# Patient Record
Sex: Female | Born: 1950
Health system: Southern US, Community
[De-identification: ages and names within clinical notes are randomized; demographics above are authoritative.]

## PROBLEM LIST (undated history)

## (undated) DIAGNOSIS — L309 Dermatitis, unspecified: Secondary | ICD-10-CM

## (undated) DIAGNOSIS — E785 Hyperlipidemia, unspecified: Secondary | ICD-10-CM

## (undated) DIAGNOSIS — Z8041 Family history of malignant neoplasm of ovary: Secondary | ICD-10-CM

## (undated) DIAGNOSIS — Z803 Family history of malignant neoplasm of breast: Secondary | ICD-10-CM

## (undated) DIAGNOSIS — Z923 Personal history of irradiation: Secondary | ICD-10-CM

## (undated) DIAGNOSIS — C50919 Malignant neoplasm of unspecified site of unspecified female breast: Secondary | ICD-10-CM

## (undated) DIAGNOSIS — I10 Essential (primary) hypertension: Secondary | ICD-10-CM

## (undated) HISTORY — PX: KNEE SURGERY: SHX244

## (undated) HISTORY — DX: Family history of malignant neoplasm of ovary: Z80.41

## (undated) HISTORY — DX: Family history of malignant neoplasm of breast: Z80.3

## (undated) HISTORY — PX: ABDOMINAL HYSTERECTOMY: SHX81

## (undated) HISTORY — DX: Dermatitis, unspecified: L30.9

## (undated) HISTORY — PX: DILATION AND CURETTAGE OF UTERUS: SHX78

## (undated) HISTORY — DX: Malignant neoplasm of unspecified site of unspecified female breast: C50.919

---

## 1999-02-25 ENCOUNTER — Other Ambulatory Visit: Admission: RE | Admit: 1999-02-25 | Discharge: 1999-02-25 | Payer: Self-pay | Admitting: *Deleted

## 2003-10-09 ENCOUNTER — Other Ambulatory Visit: Admission: RE | Admit: 2003-10-09 | Discharge: 2003-10-09 | Payer: Self-pay | Admitting: Family Medicine

## 2005-02-06 ENCOUNTER — Other Ambulatory Visit: Admission: RE | Admit: 2005-02-06 | Discharge: 2005-02-06 | Payer: Self-pay | Admitting: Family Medicine

## 2006-02-10 ENCOUNTER — Encounter: Admission: RE | Admit: 2006-02-10 | Discharge: 2006-02-10 | Payer: Self-pay | Admitting: Family Medicine

## 2006-06-27 ENCOUNTER — Other Ambulatory Visit: Admission: RE | Admit: 2006-06-27 | Discharge: 2006-06-27 | Payer: Self-pay | Admitting: Family Medicine

## 2006-06-28 ENCOUNTER — Encounter: Admission: RE | Admit: 2006-06-28 | Discharge: 2006-06-28 | Payer: Self-pay | Admitting: Radiology

## 2006-09-12 ENCOUNTER — Encounter: Admission: RE | Admit: 2006-09-12 | Discharge: 2006-09-12 | Payer: Self-pay | Admitting: Family Medicine

## 2008-07-20 ENCOUNTER — Other Ambulatory Visit: Admission: RE | Admit: 2008-07-20 | Discharge: 2008-07-20 | Payer: Self-pay | Admitting: Family Medicine

## 2009-07-23 ENCOUNTER — Other Ambulatory Visit: Admission: RE | Admit: 2009-07-23 | Discharge: 2009-07-23 | Payer: Self-pay | Admitting: Family Medicine

## 2012-10-07 ENCOUNTER — Telehealth (HOSPITAL_COMMUNITY): Payer: Self-pay | Admitting: Cardiac Rehabilitation

## 2013-10-21 ENCOUNTER — Ambulatory Visit (HOSPITAL_COMMUNITY): Payer: BC Managed Care – PPO | Attending: Cardiology | Admitting: Radiology

## 2013-10-21 ENCOUNTER — Other Ambulatory Visit (HOSPITAL_COMMUNITY): Payer: Self-pay | Admitting: Radiology

## 2013-10-21 VITALS — BP 106/72 | Ht 63.0 in | Wt 149.0 lb

## 2013-10-21 DIAGNOSIS — R002 Palpitations: Secondary | ICD-10-CM

## 2013-10-21 DIAGNOSIS — I059 Rheumatic mitral valve disease, unspecified: Secondary | ICD-10-CM | POA: Insufficient documentation

## 2013-10-21 DIAGNOSIS — E785 Hyperlipidemia, unspecified: Secondary | ICD-10-CM | POA: Insufficient documentation

## 2013-10-21 NOTE — Progress Notes (Signed)
Echocardiogram performed.  

## 2014-07-06 ENCOUNTER — Other Ambulatory Visit: Payer: Self-pay | Admitting: *Deleted

## 2014-07-06 ENCOUNTER — Encounter (INDEPENDENT_AMBULATORY_CARE_PROVIDER_SITE_OTHER): Payer: BLUE CROSS/BLUE SHIELD

## 2014-07-06 ENCOUNTER — Encounter: Payer: Self-pay | Admitting: *Deleted

## 2014-07-06 DIAGNOSIS — R55 Syncope and collapse: Secondary | ICD-10-CM

## 2014-07-06 NOTE — Progress Notes (Signed)
Per [place] at [facility] the pt needs to have a ecardio event monitor placed on by our office on 07/06/14 for pre-syncopal episodes.  Per scheduling Mack Guise and monitor tech [place], the monitor is to be placed under the DOD Dr Lovena Le.

## 2014-07-06 NOTE — Progress Notes (Signed)
Patient ID: April [name], female   DOB: 12-10-1950, 64 y.o.   MRN: [id number] [employer] 30 day cardiac event monitor applied to patient.

## 2016-01-31 ENCOUNTER — Encounter: Payer: Self-pay | Admitting: Podiatry

## 2016-01-31 ENCOUNTER — Ambulatory Visit (INDEPENDENT_AMBULATORY_CARE_PROVIDER_SITE_OTHER): Payer: BC Managed Care – PPO

## 2016-01-31 ENCOUNTER — Ambulatory Visit (INDEPENDENT_AMBULATORY_CARE_PROVIDER_SITE_OTHER): Payer: BC Managed Care – PPO | Admitting: Podiatry

## 2016-01-31 VITALS — BP 172/90 | HR 65 | Resp 14 | Ht 63.0 in | Wt 148.0 lb

## 2016-01-31 DIAGNOSIS — M722 Plantar fascial fibromatosis: Secondary | ICD-10-CM | POA: Diagnosis not present

## 2016-01-31 DIAGNOSIS — M79672 Pain in left foot: Secondary | ICD-10-CM

## 2016-01-31 MED ORDER — TRIAMCINOLONE ACETONIDE 10 MG/ML IJ SUSP
10.0000 mg | Freq: Once | INTRAMUSCULAR | Status: AC
  Administered 2016-01-31: 10 mg

## 2016-01-31 NOTE — Progress Notes (Signed)
   Subjective:    Patient ID: April [name], female    DOB: 11-Oct-1950, 65 y.o.   MRN: [id number]  HPI  Started having pain and pins in the heel arch area in July.      Review of Systems  HENT: Positive for sneezing.   Cardiovascular: Positive for palpitations.       Objective:   Physical Exam        Assessment & Plan:

## 2016-02-03 NOTE — Progress Notes (Signed)
Subjective:     Patient ID: April [name], female   DOB: [birth year], 65 y.o.   MRN: [id number]  HPI patient presents stating that the left foot is very sore in the heel and making it hard for her to walk is been present for several months   Review of Systems  All other systems reviewed and are negative.      Objective:   Physical Exam  Constitutional: She is oriented to person, place, and time.  Cardiovascular: Intact distal pulses.   Musculoskeletal: Normal range of motion.  Neurological: She is oriented to person, place, and time.  Skin: Skin is warm.  Nursing note and vitals reviewed.  neurovascular status found to be intact with muscle strength adequate range of motion within normal limits. Patient's found to have inflammation and pain aspect of the left heel at the insertional point tendon into the calcaneus and is found to have good digital perfusion and is well oriented 3     Assessment:     Inflammatory fasciitis left heel at the insertional point of the tendon into the calcaneus    Plan:     H&P x-rays reviewed and carefully injected the plantar fascia 3 mg Kenalog 5 mg Xylocaine and applied fascial brace. Gave instructions on physical therapy and stretching and reappoint to recheck

## 2016-02-14 ENCOUNTER — Ambulatory Visit (INDEPENDENT_AMBULATORY_CARE_PROVIDER_SITE_OTHER): Payer: BC Managed Care – PPO | Admitting: Podiatry

## 2016-02-14 ENCOUNTER — Encounter: Payer: Self-pay | Admitting: Podiatry

## 2016-02-14 DIAGNOSIS — M722 Plantar fascial fibromatosis: Secondary | ICD-10-CM

## 2016-02-14 MED ORDER — TRIAMCINOLONE ACETONIDE 10 MG/ML IJ SUSP
10.0000 mg | Freq: Once | INTRAMUSCULAR | Status: AC
  Administered 2016-02-14: 10 mg

## 2016-02-16 NOTE — Progress Notes (Signed)
Subjective:     Patient ID: April [name], female   DOB: Jul 29, 1950, 65 y.o.   MRN: [id number]  HPI patient presents stating that she is improving but still having pain   Review of Systems     Objective:   Physical Exam Neurovascular status intact with patient having discomfort plantar aspect left heel of a moderate nature    Assessment:     Plantar fasciitis left still present but improved    Plan:     Reinjected the plantar fascial left 3 mg Kenalog 5 mg Xylocaine and instructed on physical therapy supportive shoes and anti-inflammatories and will be seen back as needed

## 2016-07-19 ENCOUNTER — Ambulatory Visit: Payer: BC Managed Care – PPO

## 2016-07-19 ENCOUNTER — Ambulatory Visit (HOSPITAL_COMMUNITY)
Admission: EM | Admit: 2016-07-19 | Discharge: 2016-07-19 | Disposition: A | Discharge status: Home / Self Care | Payer: BLUE CROSS/BLUE SHIELD

## 2017-05-19 DIAGNOSIS — C50919 Malignant neoplasm of unspecified site of unspecified female breast: Secondary | ICD-10-CM | POA: Diagnosis present

## 2017-05-19 DIAGNOSIS — Z170 Estrogen receptor positive status [ER+]: Secondary | ICD-10-CM | POA: Diagnosis present

## 2017-05-19 DIAGNOSIS — C50912 Malignant neoplasm of unspecified site of left female breast: Secondary | ICD-10-CM | POA: Diagnosis present

## 2017-05-19 HISTORY — DX: Malignant neoplasm of unspecified site of unspecified female breast: C50.919

## 2017-06-20 ENCOUNTER — Other Ambulatory Visit: Payer: Self-pay | Admitting: Radiology

## 2017-06-22 ENCOUNTER — Telehealth: Payer: Self-pay | Admitting: Oncology

## 2017-06-22 ENCOUNTER — Encounter: Payer: Self-pay | Admitting: *Deleted

## 2017-06-22 NOTE — Telephone Encounter (Signed)
Confirmed upcoming [facility] appointment for 06/27/17, patient is a [facility] patient no paperwork sent

## 2017-06-26 ENCOUNTER — Other Ambulatory Visit: Payer: Self-pay | Admitting: *Deleted

## 2017-06-26 DIAGNOSIS — Z171 Estrogen receptor negative status [ER-]: Principal | ICD-10-CM

## 2017-06-26 DIAGNOSIS — C50411 Malignant neoplasm of upper-outer quadrant of right female breast: Secondary | ICD-10-CM | POA: Insufficient documentation

## 2017-06-27 ENCOUNTER — Encounter: Payer: Self-pay | Admitting: Oncology

## 2017-06-27 ENCOUNTER — Encounter: Payer: Self-pay | Admitting: *Deleted

## 2017-06-27 ENCOUNTER — Ambulatory Visit: Payer: Medicare Other | Attending: General Surgery | Admitting: Physical Therapy

## 2017-06-27 ENCOUNTER — Encounter: Payer: Self-pay | Admitting: Radiation Oncology

## 2017-06-27 ENCOUNTER — Encounter: Payer: Self-pay | Admitting: Physical Therapy

## 2017-06-27 ENCOUNTER — Inpatient Hospital Stay: Payer: Medicare Other

## 2017-06-27 ENCOUNTER — Inpatient Hospital Stay: Payer: Medicare Other | Attending: Oncology | Admitting: Oncology

## 2017-06-27 ENCOUNTER — Ambulatory Visit
Admission: RE | Admit: 2017-06-27 | Discharge: 2017-06-27 | Disposition: A | Discharge status: Home / Self Care | Payer: BLUE CROSS/BLUE SHIELD | Source: Ambulatory Visit | Attending: Radiation Oncology | Admitting: Radiation Oncology

## 2017-06-27 VITALS — BP 198/86 | HR 70 | Temp 98.3°F | Resp 18 | Ht 63.0 in | Wt 151.0 lb

## 2017-06-27 DIAGNOSIS — R293 Abnormal posture: Secondary | ICD-10-CM | POA: Diagnosis present

## 2017-06-27 DIAGNOSIS — Z803 Family history of malignant neoplasm of breast: Secondary | ICD-10-CM | POA: Diagnosis not present

## 2017-06-27 DIAGNOSIS — Z90710 Acquired absence of both cervix and uterus: Secondary | ICD-10-CM | POA: Diagnosis not present

## 2017-06-27 DIAGNOSIS — Z171 Estrogen receptor negative status [ER-]: Secondary | ICD-10-CM | POA: Insufficient documentation

## 2017-06-27 DIAGNOSIS — C50411 Malignant neoplasm of upper-outer quadrant of right female breast: Secondary | ICD-10-CM | POA: Insufficient documentation

## 2017-06-27 DIAGNOSIS — Z8041 Family history of malignant neoplasm of ovary: Secondary | ICD-10-CM

## 2017-06-27 LAB — CBC WITH DIFFERENTIAL (CANCER CENTER ONLY)
Abs Granulocyte: 1.8 10*3/uL (ref 1.5–6.5)
Basophils Absolute: 0 10*3/uL (ref 0.0–0.1)
Basophils Relative: 1 %
Eosinophils Absolute: 0.2 10*3/uL (ref 0.0–0.5)
Eosinophils Relative: 4 %
HCT: 37.5 % (ref 34.8–46.6)
Hemoglobin: 12.2 g/dL (ref 11.6–15.9)
Lymphocytes Relative: 43 %
Lymphs Abs: 1.8 10*3/uL (ref 0.9–3.3)
MCH: 28.9 pg (ref 25.1–34.0)
MCHC: 32.6 g/dL (ref 31.5–36.0)
MCV: 88.8 fL (ref 79.5–101.0)
Monocytes Absolute: 0.4 10*3/uL (ref 0.1–0.9)
Monocytes Relative: 9 %
Neutro Abs: 1.8 10*3/uL (ref 1.5–6.5)
Neutrophils Relative %: 43 %
Platelet Count: 274 10*3/uL (ref 145–400)
RBC: 4.22 MIL/uL (ref 3.70–5.45)
RDW: 13.3 % (ref 11.2–16.1)
WBC Count: 4.2 10*3/uL (ref 4.0–10.3)

## 2017-06-27 LAB — CMP (CANCER CENTER ONLY)
ALT: 14 U/L (ref 0–55)
AST: 16 U/L (ref 5–34)
Albumin: 4 g/dL (ref 3.5–5.0)
Alkaline Phosphatase: 77 U/L (ref 40–150)
Anion gap: 10 (ref 3–11)
BUN: 14 mg/dL (ref 7–26)
CO2: 26 mmol/L (ref 22–29)
Calcium: 9.6 mg/dL (ref 8.4–10.4)
Chloride: 106 mmol/L (ref 98–109)
Creatinine: 1.01 mg/dL (ref 0.70–1.30)
GFR, Est AFR Am: >60 mL/min (ref >60)
GFR, Estimated: 56 mL/min — ABNORMAL LOW (ref >60)
Glucose, Bld: 96 mg/dL (ref 70–140)
Potassium: 4.1 mmol/L (ref 3.3–4.7)
Sodium: 142 mmol/L (ref 136–145)
Total Bilirubin: 0.2 mg/dL (ref 0.2–1.2)
Total Protein: 7.7 g/dL (ref 6.4–8.3)

## 2017-06-27 NOTE — Progress Notes (Signed)
[place]  [phone] [phone]) [phone]     ID: April [name] DOB: 1950/11/25  MR#: [id number]  FBP#:[id number]  Patient Care Team: Lavone Orn, MD as PCP - General (Internal Medicine) Malka Bocek, Virgie Dad, MD as Consulting Physician (Oncology) Rolm Bookbinder, MD as Consulting Physician (General Surgery) Eppie Gibson, MD as Attending Physician (Radiation Oncology) Newt Minion, MD as Consulting Physician (Orthopedic Surgery) Bobetta Lime, MD OTHER MD:  CHIEF COMPLAINT: Estrogen receptor negative breast cancer  CURRENT TREATMENT: Awaiting definitive surgery   HISTORY OF CURRENT ILLNESS: "April [name]" had bilateral screening mammography at [facility] 06/06/2017.  This showed a possible mass in the right breast at the 11 o'clock position.  On 06/13/2017 she underwent right diagnostic mammography and ultrasonography.  Breast density was category C.  In the right breast at the 11 o'clock position there was a 1 cm area by mammography.  By ultrasound this confirmed a 1.0 cm irregular mass with lobulated margins in the upper outer quadrant of the right breast.  There was a second, 0.4 cm lobulated mass in the same quadrant.  The right axilla was sonographically benign.  On 06/20/2017 biopsy of the 2 right breast masses in question was performed.  The final pathology (SAA [id number]) found the smaller mass to be only fibrocystic change.  This is felt to be concordant.  The larger mass however was an invasive ductal carcinoma, grade 3, estrogen and progesterone receptor negative, with an MIB-1 of 30%, and HER-2 amplified, with a signals ratio of 2.24.  The number per cell was 4.60.  The patient's subsequent history is as detailed below.  INTERVAL HISTORY: April [name] was evaluated in the multidisciplinary breast cancer clinic 06/27/2017, accompanied by her husband April [name].  Her case was also presented at the multidisciplinary breast cancer conference on the same day.  At that time a preliminary plan was proposed: Psychologist, sport and exercise, breast conserving surgery with sentinel lymph node sampling, adjuvant chemotherapy, adjuvant radiation   REVIEW OF SYSTEMS: There were no specific symptoms leading to the original mammogram, which was routinely scheduled. The patient denies unusual headaches, visual changes, nausea, vomiting, stiff neck, dizziness, or gait imbalance. There has been no cough, phlegm production, or pleurisy, no chest pain or pressure, and no change in bowel or bladder habits. The patient denies fever, rash, bleeding, unexplained fatigue or unexplained weight loss.  She does not exercise regularly.  A detailed review of systems was otherwise entirely negative.   PAST MEDICAL HISTORY: Past Medical History:  Diagnosis Date  . Eczema     PAST SURGICAL HISTORY: Past Surgical History:  Procedure Laterality Date  . ABDOMINAL HYSTERECTOMY    . CESAREAN SECTION    . KNEE SURGERY      FAMILY HISTORY Family History  Problem Relation Age of Onset  . Breast cancer Mother   . Breast cancer Sister   . Breast cancer Maternal Grandmother   The patient's father died at age 61 from a heart attack.  The patient's mother is currently living at age 90 (as of January 2019).  The patient had 1 sister who was diagnosed with breast cancer at age 57 and died from metastatic disease at age 27.  The patient has 1 brother.  In addition the patient's mother was diagnosed with breast cancer at age 6, on the left side, and now has a right-sided breast cancer diagnosed in January 2019.  There is in addition a cousin with ovarian cancer diagnosed when she was 68 years old  GYNECOLOGIC  HISTORY:  No LMP recorded. Patient is not currently having periods (Reason: Perimenopausal). Menarche age 20, first live birth age 29, the patient had 3 live births, 1 of whom survived only 2 days.  She underwent hysterectomy without salpingo-oophorectomy September 05, 1978.  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  She is now retired.  Her husband April [name] his Theme park manager at [employer].  The patient's daughter April [name] lives in [place] where she is an Tourist information centre manager.  The patient's daughter April [name] lives in [place] working for the department of defense.  The patient has 3 grandchildren.     ADVANCED DIRECTIVES: Not in place   HEALTH MAINTENANCE: Social History   Tobacco Use  . Smoking status: Never Smoker  . Smokeless tobacco: Never Used  Substance Use Topics  . Alcohol use: Yes  . Drug use: No     Colonoscopy: April 2018/[facility]  PAP: Status post hysterectomy  Bone density:   Allergies  Allergen Reactions  . Tape Itching and Rash  . Nsaids   . Sulfa Antibiotics Rash    Current Outpatient Medications  Medication Sig Dispense Refill  . CALCIUM-VITAMIN D PO Take 1 tablet by mouth daily.    Marland Kitchen GENERIC EXTERNAL MEDICATION Take 1 tablet by mouth daily.    Marland Kitchen GENERIC EXTERNAL MEDICATION Take 16 oz by mouth daily.    Marland Kitchen GENERIC EXTERNAL MEDICATION Take 8 oz by mouth daily.    . Multiple Vitamin (MULTIVITAMIN WITH MINERALS) TABS tablet Take 1 tablet by mouth daily.    . vitamin B-12 (CYANOCOBALAMIN) 1000 MCG tablet Take 1,000 mcg by mouth daily.     No current facility-administered medications for this visit.     OBJECTIVE: Middle-aged African-American [name] who appears well  Vitals:   06/27/17 0906  BP: (!) 198/86  Pulse: 70  Resp: 18  Temp: 98.3 F (36.8 C)  SpO2: 100%     Body mass index is 26.75 kg/m.   Wt Readings from Last 3 Encounters:  06/27/17 151 lb (68.5 kg)  01/31/16 148 lb (67.1 kg)  10/21/13 149 lb (67.6 kg)      ECOG FS:0 - Asymptomatic  Ocular: Sclerae unicteric, pupils round and equal Ear-nose-throat: Oropharynx clear and moist Lymphatic: No cervical or supraclavicular  adenopathy Lungs no rales or rhonchi Heart regular rate and rhythm Abd soft, nontender, positive bowel sounds MSK no focal spinal tenderness, no joint edema Neuro: non-focal, well-oriented, appropriate affect Breasts: The right breast is status post recent biopsy.  There is a mild ecchymosis.  I do not palpate a mass.  There are no skin or nipple changes of concern.  Left breast is unremarkable.  Both axillae are benign   LAB RESULTS:  CMP     Component Value Date/Time   NA 142 06/27/2017 0843   K 4.1 06/27/2017 0843   CL 106 06/27/2017 0843   CO2 26 06/27/2017 0843   GLUCOSE 96 06/27/2017 0843   BUN 14 06/27/2017 0843   CALCIUM 9.6 06/27/2017 0843   PROT 7.7 06/27/2017 0843   ALBUMIN 4.0 06/27/2017 0843   AST 16 06/27/2017 0843   ALT 14 06/27/2017 0843   ALKPHOS 77 06/27/2017 0843   BILITOT 0.2 06/27/2017 0843    No results found for: TOTALPROTELP, ALBUMINELP, A1GS, A2GS, BETS, BETA2SER, GAMS, MSPIKE, SPEI  No results found for: KPAFRELGTCHN, LAMBDASER, KAPLAMBRATIO  Lab Results  Component Value Date   NEUTROABS 1.8 06/27/2017   HCT 37.5 06/27/2017   MCV 88.8 06/27/2017    '@LASTCHEMISTRY' @  No results found for: LABCA2  No components found for: [id number]  No results for input(s): INR in the last 168 hours.  No results found for: LABCA2  No results found for: RFX588  No results found for: TGP498  No results found for: YME158  No results found for: CA2729  No components found for: HGQUANT  No results found for: CEA1 / No results found for: CEA1   No results found for: AFPTUMOR  No results found for: CHROMOGRNA  No results found for: PSA1  Appointment on 06/27/2017  Component Date Value Ref Range Status  . Sodium 06/27/2017 142  136 - 145 mmol/L Final  . Potassium 06/27/2017 4.1  3.3 - 4.7 mmol/L Final  . Chloride 06/27/2017 106  98 - 109 mmol/L Final  . CO2 06/27/2017 26  22 - 29 mmol/L Final  . Glucose, Bld 06/27/2017 96  70 - 140 mg/dL Final   . BUN 06/27/2017 14  7 - 26 mg/dL Final  . Creatinine 06/27/2017 1.01  0.70 - 1.30 mg/dL Final  . Calcium 06/27/2017 9.6  8.4 - 10.4 mg/dL Final  . Total Protein 06/27/2017 7.7  6.4 - 8.3 g/dL Final  . Albumin 06/27/2017 4.0  3.5 - 5.0 g/dL Final  . AST 06/27/2017 16  5 - 34 U/L Final  . ALT 06/27/2017 14  0 - 55 U/L Final  . Alkaline Phosphatase 06/27/2017 77  40 - 150 U/L Final  . Total Bilirubin 06/27/2017 0.2  0.2 - 1.2 mg/dL Final  . GFR, Est Non Af Am 06/27/2017 56* >60 mL/min Final  . GFR, Est AFR Am 06/27/2017 >60  >60 mL/min Final   Comment: (NOTE) The eGFR has been calculated using the CKD EPI equation. This calculation has not been validated in all clinical situations. eGFR's persistently <60 mL/min signify possible Chronic Kidney Disease.   Georgiann Hahn gap 06/27/2017 10  3 - 11 Final   Performed at [facility], [place] [address] [place], [place] [address]  . WBC Count 06/27/2017 4.2  4.0 - 10.3 K/uL Final  . RBC 06/27/2017 4.22  3.70 - 5.45 MIL/uL Final  . Hemoglobin 06/27/2017 12.2  11.6 - 15.9 g/dL Final  . HCT 06/27/2017 37.5  34.8 - 46.6 % Final  . MCV 06/27/2017 88.8  79.5 - 101.0 fL Final  . MCH 06/27/2017 28.9  25.1 - 34.0 pg Final  . MCHC 06/27/2017 32.6  31.5 - 36.0 g/dL Final  . RDW 06/27/2017 13.3  11.2 - 16.1 % Final  . Platelet Count 06/27/2017 274  145 - 400 K/uL Final  . Neutrophils Relative % 06/27/2017 43  % Final  . Neutro Abs 06/27/2017 1.8  1.5 - 6.5 K/uL Final  . Abs Granulocyte 06/27/2017 1.8  1.5 - 6.5 K/uL Final  . Lymphocytes Relative 06/27/2017 43  % Final  . Lymphs Abs 06/27/2017 1.8  0.9 - 3.3 K/uL Final  . Monocytes Relative 06/27/2017 9  % Final  . Monocytes Absolute 06/27/2017 0.4  0.1 - 0.9 K/uL Final  . Eosinophils Relative 06/27/2017 4  % Final  . Eosinophils Absolute 06/27/2017 0.2  0.0 - 0.5 K/uL Final  . Basophils Relative 06/27/2017 1  % Final  . Basophils Absolute 06/27/2017 0.0  0.0 - 0.1 K/uL Final    Performed at [facility], [address] [place], [place] [address]    (this displays the last labs from the last 3 days)  No results found for: TOTALPROTELP, ALBUMINELP, A1GS, A2GS, BETS, BETA2SER, GAMS, MSPIKE, SPEI (this displays SPEP labs)  No results found for: KPAFRELGTCHN, LAMBDASER, KAPLAMBRATIO (kappa/lambda light chains)  No results found for: HGBA, HGBA2QUANT, HGBFQUANT, HGBSQUAN (Hemoglobinopathy evaluation)   No results found for: LDH  No results found for: IRON, TIBC, IRONPCTSAT (Iron and TIBC)  No results found for: FERRITIN  Urinalysis No results found for: COLORURINE, APPEARANCEUR, LABSPEC, PHURINE, GLUCOSEU, HGBUR, BILIRUBINUR, KETONESUR, PROTEINUR, UROBILINOGEN, NITRITE, LEUKOCYTESUR   STUDIES: Outside studies reviewed  ELIGIBLE FOR AVAILABLE RESEARCH PROTOCOL: no  ASSESSMENT: 67 y.o. April [name] status post right breast upper outer quadrant biopsy 06/20/2017 for a clinical T1b N0, stage IA invasive ductal carcinoma, grade 3, estrogen and progesterone receptor negative, but HER-2 amplified, with an MIB-1 of 30%  (1) genetics testing scheduled for 06/28/2017  (2) definitive surgery to follow  (3) adjuvant chemotherapy will consist of paclitaxel weekly x12 together with trastuzumab every 21 days  (4) trastuzumab will be continued to total 1 year  (a) echo pending  5) adjuvant radiation to follow  PLAN: We spent the better part of today's hour-long appointment discussing the biology of her diagnosis and the specifics of her situation. We first reviewed the fact that cancer is not one disease but more than 100 different diseases and that it is important to keep them separate-- otherwise when friends and relatives discuss their own cancer experiences with Maesyn confusion can result. Similarly we explained that if breast cancer spreads to the bone or liver, the patient would not have bone cancer or liver cancer,  but breast cancer in the bone and breast cancer in the liver: one cancer in three places-- not 3 different cancers which otherwise would have to be treated in 3 different ways.  We discussed the difference between local and systemic therapy. In terms of loco-regional treatment, lumpectomy plus radiation is equivalent to mastectomy as far as survival is concerned. For this reason, and because the cosmetic results are generally superior, we generally recommend breast conserving surgery.  In her case however there is a strong family history suggestive of a deleterious mutation so we discussed that in detail as well.  In patients who carry a deleterious mutation [for example in a  BRCA gene], the risk of a new breast cancer developing in the future may be sufficiently great that the patient may choose bilateral mastectomies. However if she wishes to keep her breasts in that situation it is safe to do so. That would require intensified screening, which generally means not only yearly mammography but a yearly breast MRI as well. Of course, if there is a deleterious mutation bilateral oophorectomy would be necessary as there is no standard screening protocol for ovarian cancer.  We then discussed the rationale for systemic therapy. There is some risk that this aggressive, fast growing, HER-2 positive breast cancer may have already spread to other parts of her body. Patients frequently ask at this point about bone scans, CAT scans and PET scans to find out if they have occult breast cancer somewhere else. The problem is that in early stage disease we are much more likely to find false positives then true cancers and this would expose the patient to unnecessary procedures as well as unnecessary radiation. Scans cannot answer the question the patient really would like to know, which is whether she has microscopic disease  elsewhere in her body. For those reasons we do not recommend them.  Of course we would proceed to  aggressive evaluation of any symptoms that might suggest metastatic disease, but that is not the case here.  Next we went over the options for systemic therapy which are anti-estrogens, anti-HER-2 immunotherapy, and chemotherapy. Lucie does not meet criteria for anti-estrogens.  She is a good candidate for anti-HER-2 immunotherapy and chemotherapy.  We specifically discussed paclitaxel weekly x12, as well as trastuzumab every 3 weeks for a 1 year.  We discussed the possible toxicity side effects and complications of these agents.  Audrielle understands she will need a port and this can be placed at the time of her definitive surgery.  The overall plan then is for genetics testing, definitive surgery, chemotherapy with Taxol and immunotherapy with Herceptin for 1 year, with adjuvant radiation to follow after chemotherapy.  Natelie understands the goal of treatment is cure.  Her chance of cure will be excellent with this treatment, which is entirely standard.  Before she returns to see me after her surgery she will also have had a repeat echocardiogram (her most recent one, in 2015, was normal), and she will also go to chemotherapy school to learn more about the agents she will be receiving  Kaiyah has a good understanding of the overall plan. She agrees with it. She knows the goal of treatment in her case is cure. She will call with any problems that may develop before her next visit here.   Marney Treloar, Virgie Dad, MD  06/27/17 9:48 AM Medical Oncology and Hematology [facility] [address] [place], [place] [address] Tel. [phone]    Fax. [phone]  This document serves as a record of services personally performed by Lurline Del, MD. It was created on his behalf by Sheron Nightingale, a trained medical scribe. The creation of this record is based on the scribe's personal observations and the provider's statements to them.   I have reviewed the above documentation for  accuracy and completeness, and I agree with the above.

## 2017-06-27 NOTE — Progress Notes (Signed)
Nutrition Assessment  Reason for Assessment:  Pt seen in Breast Clinic  ASSESSMENT:   67 year old female with new diagnosis of breast cancer.  Past medical history reviewed.  Patient reports good appetite.  Medications:  reviewed  Labs: reviewed  Anthropometrics:   Height: 63 inches Weight: 151 lb BMI: 26   NUTRITION DIAGNOSIS: Food and nutrition related knowledge deficit related to new diagnosis of breast cancer as evidenced by no prior need for nutrition related information.  INTERVENTION:   Discussed and provided packet of information regarding nutritional tips for breast cancer patients.  Questions answered.  Teachback method used.  Contact information provided and patient knows to contact me with questions/concerns.    MONITORING, EVALUATION, and GOAL: Pt will consume a healthy plant based diet to maintain lean body mass throughout treatment.   Deanglo Hissong B. Zenia Resides, [place], [place] Registered Dietitian [phone] (pager)

## 2017-06-27 NOTE — Progress Notes (Signed)
Radiation Oncology         [phone] ________________________________  Initial Outpatient Consultation  Name: April [name] MRN: [id number]  Date: 06/27/2017  DOB: Aug 06, 1950  PZ:WCHENID, Jenny Reichmann, MD  Rolm Bookbinder, MD   REFERRING PHYSICIAN: Rolm Bookbinder, MD  DIAGNOSIS:    ICD-10-CM   1. Malignant neoplasm of upper-outer quadrant of right breast in female, estrogen receptor negative ([place]) C50.411    Z17.1   Cancer Staging Malignant neoplasm of upper-outer quadrant of right breast in female, estrogen receptor negative ([place]) Staging form: Breast, AJCC 8th Edition - Clinical stage from 06/27/2017: Stage IA (cT1b, cN0, cM0, G3, ER: Negative, PR: Negative, HER2: Positive) - Signed by Eppie Gibson, MD on 06/27/2017 Staging comments: Staged at breast conference on 1.9.19   Stage IA Right Breast UOQ Invasive Ductal Carcinoma with DCIS, ER(-) / PR(-) / Her2(+), Grade 3  CHIEF COMPLAINT: Here to discuss management of right breast cancer  HISTORY OF PRESENT ILLNESS::April [name] is a 67 y.o. female who presented with screening detected right breast mass on 06/13/2017 mammography. Ultrasound of the right breast showed a 1 cm irregular mass in the UOQ and a 4 mm lobulated mass in the UOQ that are 2.7 cm apart. No abnormalities were seen sonographically in the right axilla. Biopsy of the 4 mm mass at 11:00 was negative, showing only fibrocystic changes. Biopsy of the 1 cm mass at 10:00 showed invasive ductal carcinoma and DCIS with characteristics as described above in the diagnosis.  The patient presents to our [facility] today for evaluation and discussion of potential treatment options. She is accompanied by her husband. On review of systems, she is positive for hot flashes, and she wears glasses. She has a family history of breast cancer in her mother, sister, and maternal grandmother. She has one daughter who lives in [place],  [place].  PREVIOUS RADIATION THERAPY: No  PAST MEDICAL HISTORY:  has a past medical history of Eczema.    PAST SURGICAL HISTORY: Past Surgical History:  Procedure Laterality Date  . ABDOMINAL HYSTERECTOMY    . CESAREAN SECTION    . KNEE SURGERY      FAMILY HISTORY: family history includes Breast cancer in her maternal grandmother, mother, and sister.  SOCIAL HISTORY:  reports that  has never smoked. she has never used smokeless tobacco. She reports that she drinks alcohol. She reports that she does not use drugs.  ALLERGIES: Tape; Nsaids; and Sulfa antibiotics  MEDICATIONS:  Current Outpatient Medications  Medication Sig Dispense Refill  . CALCIUM-VITAMIN D PO Take 1 tablet by mouth daily.    Marland Kitchen GENERIC EXTERNAL MEDICATION Take 1 tablet by mouth daily.    Marland Kitchen GENERIC EXTERNAL MEDICATION Take 16 oz by mouth daily.    Marland Kitchen GENERIC EXTERNAL MEDICATION Take 8 oz by mouth daily.    . Multiple Vitamin (MULTIVITAMIN WITH MINERALS) TABS tablet Take 1 tablet by mouth daily.    . vitamin B-12 (CYANOCOBALAMIN) 1000 MCG tablet Take 1,000 mcg by mouth daily.     No current facility-administered medications for this encounter.     REVIEW OF SYSTEMS: A 10+ POINT REVIEW OF SYSTEMS WAS OBTAINED including neurology, dermatology, psychiatry, cardiac, respiratory, lymph, extremities, GI, GU, Musculoskeletal, constitutional, breasts, reproductive, HEENT.  All pertinent positives are noted in the HPI.  All others are negative.   PHYSICAL EXAM:  Vitals with BMI 06/27/2017  Height '5\' 3"'   Weight 151 lbs  BMI 78.24  Systolic 235  Diastolic 86  Pulse 70  Respirations 18   General: Alert and oriented, in no acute distress. HEENT: Head is normocephalic. Extraocular movements are intact. Oropharynx is clear. Neck: Neck is supple, no palpable cervical or supraclavicular lymphadenopathy. Heart: Regular in rate and rhythm with no murmurs, rubs, or gallops. Chest: Clear to auscultation bilaterally, with no  rhonchi, wheezes, or rales. Abdomen: Soft, nontender, nondistended, with no rigidity or guarding. Extremities: No cyanosis or edema. Lymphatics: see Neck Exam Skin: No concerning lesions. Musculoskeletal: Symmetric strength and muscle tone throughout. Neurologic: Cranial nerves II through XII are grossly intact. No obvious focalities. Speech is fluent. Coordination is intact. Psychiatric: Judgment and insight are intact. Affect is appropriate. Breasts: Around the 12:00 position centrally in the right breast, she has a palpable firmness that is not well demarkated and difficult to measure. No palpable lymph nodes in the right axilla. No other palpable masses appreciated in the left breast or axilla.  ECOG = 0  0 - Asymptomatic (Fully active, able to carry on all predisease activities without restriction)  1 - Symptomatic but completely ambulatory (Restricted in physically strenuous activity but ambulatory and able to carry out work of a light or sedentary nature. For example, light housework, office work)  2 - Symptomatic, <50% in bed during the day (Ambulatory and capable of all self care but unable to carry out any work activities. Up and about more than 50% of waking hours)  3 - Symptomatic, >50% in bed, but not bedbound (Capable of only limited self-care, confined to bed or chair 50% or more of waking hours)  4 - Bedbound (Completely disabled. Cannot carry on any self-care. Totally confined to bed or chair)  5 - Death   Eustace Pen MM, Creech RH, Tormey DC, et al. [phone]). "Toxicity and response criteria of the [facility]". [place] Oncol. 5 (6): 649-55   LABORATORY DATA:  Lab Results  Component Value Date   HCT 37.5 06/27/2017   MCV 88.8 06/27/2017   CMP     Component Value Date/Time   NA 142 06/27/2017 0843   K 4.1 06/27/2017 0843   CL 106 06/27/2017 0843   CO2 26 06/27/2017 0843   GLUCOSE 96 06/27/2017 0843   BUN 14 06/27/2017 0843   CALCIUM 9.6  06/27/2017 0843   PROT 7.7 06/27/2017 0843   ALBUMIN 4.0 06/27/2017 0843   AST 16 06/27/2017 0843   ALT 14 06/27/2017 0843   ALKPHOS 77 06/27/2017 0843   BILITOT 0.2 06/27/2017 0843      RADIOGRAPHY:  As above    IMPRESSION/PLAN: Right Breast Cancer  The tumor board consensus is to proceed with lumpectomy with sentinel lymph node biopsy, genetic evaluation, adjuvant chemotherapy, and adjuvant radiotherapy.  She has been discussed at our multidisciplinary tumor board.  The consensus is that she would be a good candidate for breast conservation. I talked to her about the option of a mastectomy and informed her that her expected overall survival would be equivalent between mastectomy and breast conservation, based upon randomized controlled data. She is enthusiastic about breast conservation.  It was a pleasure meeting the patient today. We discussed the risks, benefits, and side effects of radiotherapy. I recommend radiotherapy to the right breast to reduce her risk of locoregional recurrence by 2/3.  Anticipate 4 weeks of treatment. We discussed that radiation would take approximately 4-6 weeks to complete and that I would give the patient a few weeks to heal following surgery before starting treatment planning. If chemotherapy were to be  given, this would precede radiotherapy. We spoke about acute effects including skin irritation and fatigue as well as much less common late effects including internal organ injury or irritation. We spoke about the latest technology that is used to minimize the risk of late effects for patients undergoing radiotherapy to the breast or chest wall. No guarantees of treatment were given. The patient is enthusiastic about proceeding with treatment. I look forward to participating in the patient's care.  I will await her referral back to me for postoperative follow-up and eventual CT simulation/treatment planning.    __________________________________________   Eppie Gibson, MD  This document serves as a record of services personally performed by Eppie Gibson, MD. It was created on her behalf by Rae Lips, a trained medical scribe. The creation of this record is based on the scribe's personal observations and the provider's statements to them. This document has been checked and approved by the attending provider.

## 2017-06-27 NOTE — Patient Instructions (Signed)

## 2017-06-27 NOTE — Progress Notes (Signed)
Clinical Social Work [place] Psychosocial Distress Screening [place]  Patient completed distress screening protocol and scored a 4 on the Psychosocial Distress Thermometer which indicates mild distress. Clinical Social Worker met with patient and patients family in [facility] to assess for distress and other psychosocial needs. Patient stated she was feeling overwhelmed but felt "better" after meeting with the treatment team and getting more information on her treatment plan. CSW and patient discussed common feeling and emotions when being diagnosed with cancer, and the importance of support during treatment. CSW informed patient of the support team and support services at [facility], and patient was agreeable to an [employer] referral. CSW provided contact information and encouraged patient to call with any questions or concerns.  ONCBCN DISTRESS SCREENING 06/27/2017  Screening Type Initial Screening  Distress experienced in past week (1-10) 4  Emotional problem type Adjusting to illness  Spiritual/Religous concerns type Facing my mortality  Information Concerns Type Lack of info about diagnosis;Lack of info about treatment;Lack of info about complementary therapy choices;Lack of info about maintaining fitness  Referral to support programs Yes     Johnnye Lana, MSW, LCSW, OSW-C Clinical Social Worker [facility] [phone]

## 2017-06-27 NOTE — Therapy (Signed)
[place], [place], [address] Phone: [phone]   Fax:  [phone]  Physical Therapy Evaluation  Patient Details  Name: April [name] MRN: [id number] Date of Birth: Oct 11, 1950 Referring Provider: Dr. Rolm Bookbinder   Encounter Date: 06/27/2017  PT End of Session - 06/27/17 1200    Visit Number  1    Number of Visits  2    Date for PT Re-Evaluation  08/22/17    PT Start Time  1056    PT Stop Time  1120    PT Time Calculation (min)  24 min    Activity Tolerance  Patient tolerated treatment well    Behavior During Therapy  [facility] for tasks assessed/performed       Past Medical History:  Diagnosis Date  . Eczema     Past Surgical History:  Procedure Laterality Date  . ABDOMINAL HYSTERECTOMY    . CESAREAN SECTION    . KNEE SURGERY      There were no vitals filed for this visit.   Subjective Assessment - 06/27/17 1149    Subjective  Patient reports she is here today to be seen by her medical team for her newly diagnosed right breast cancer.    Pertinent History  Patient was diagnosed on 06/06/17 with right grade 3 invasive ductal carcinoma breast cancer. It measures 1 cm and is located in the upper outer quadrant. It is ER/PR negative and HER2 posiitve with a Ki67 of 30%. She has no other medical problems but has a sister who died of breast cancer and her Mom currently has a breast cancer recurrence.    Patient Stated Goals  Reduce lymphedema risk and learn post op shoulder ROM HEP    Currently in Pain?  No/denies         [facility] PT Assessment - 06/27/17 0001      Assessment   Medical Diagnosis  Right breast cancer    Referring Provider  Dr. Rolm Bookbinder    Onset Date/Surgical Date  06/06/17    Hand Dominance  Right    Prior Therapy  none      Precautions   Precautions  Other (comment)    Precaution Comments  Active cancer      Restrictions   Weight Bearing Restrictions   No      Balance Screen   Has the patient fallen in the past 6 months  No    Has the patient had a decrease in activity level because of a fear of falling?   No    Is the patient reluctant to leave their home because of a fear of falling?   No      Home Environment   Living Environment  Private residence    Living Arrangements  Spouse/significant other;Parent Husband and mom    Available Help at Discharge  Family      Prior Function   Level of Olds  Retired    Leisure  She does not exercise      Cognition   Overall Cognitive Status  Within Functional Limits for tasks assessed      Posture/Postural Control   Posture/Postural Control  Postural limitations    Postural Limitations  Rounded Shoulders;Forward head      ROM / Strength   AROM / PROM / Strength  AROM;Strength      AROM   AROM Assessment Site  Shoulder;Cervical    Right/Left Shoulder  Right;Left    Right Shoulder Extension  45 Degrees    Right Shoulder Flexion  151 Degrees    Right Shoulder ABduction  155 Degrees    Right Shoulder Internal Rotation  57 Degrees    Right Shoulder External Rotation  81 Degrees    Left Shoulder Extension  50 Degrees    Left Shoulder Flexion  158 Degrees    Left Shoulder ABduction  169 Degrees    Left Shoulder Internal Rotation  61 Degrees    Left Shoulder External Rotation  89 Degrees    Cervical Flexion  WNL    Cervical Extension  WNL    Cervical - Right Side Bend  WNL    Cervical - Left Side Bend  WNL    Cervical - Right Rotation  WNL    Cervical - Left Rotation  WNL      Strength   Overall Strength  Within functional limits for tasks performed        LYMPHEDEMA/ONCOLOGY QUESTIONNAIRE - 06/27/17 1159      Type   Cancer Type  Right breast cancer      Lymphedema Assessments   Lymphedema Assessments  Upper extremities      Right Upper Extremity Lymphedema   10 cm Proximal to Olecranon Process  28.9 cm    Olecranon Process  24.4 cm    10  cm Proximal to Ulnar Styloid Process  19.6 cm    Just Proximal to Ulnar Styloid Process  13.9 cm    Across Hand at [employer]  17.6 cm    At [place] of 2nd Digit  5.8 cm      Left Upper Extremity Lymphedema   10 cm Proximal to Olecranon Process  28.8 cm    Olecranon Process  23.8 cm    10 cm Proximal to Ulnar Styloid Process  18.8 cm    Just Proximal to Ulnar Styloid Process  13.7 cm    Across Hand at [employer]  17 cm    At [place] of 2nd Digit  5.7 cm          Objective measurements completed on examination: See above findings.     Patient was instructed today in a home exercise program today for post op shoulder range of motion. These included active assist shoulder flexion in sitting, scapular retraction, wall walking with shoulder abduction, and hands behind head external rotation.  She was encouraged to do these twice a day, holding 3 seconds and repeating 5 times when permitted by her physician.     PT Education - 06/27/17 1200    Education provided  Yes    Education Details  Lymphedema risk reduction and post op shoulder ROM HEP    Person(s) Educated  Patient;Spouse    Methods  Explanation;Demonstration;Handout    Comprehension  Returned demonstration;Verbalized understanding          PT Long Term Goals - 06/27/17 1206      PT LONG TERM GOAL #1   Title  Patient will demonstrate she has returned to baseline since surgery related to shoulder ROM and function.    Time  8    Period  Weeks    Status  New      Breast Clinic Goals - 06/27/17 1206      Patient will be able to verbalize understanding of pertinent lymphedema risk reduction practices relevant to her diagnosis specifically related to skin care.   Time  1  Period  Days    Status  Achieved      Patient will be able to return demonstrate and/or verbalize understanding of the post-op home exercise program related to regaining shoulder range of motion.   Time  1    Period  Days    Status   Achieved      Patient will be able to verbalize understanding of the importance of attending the postoperative After Breast Cancer Class for further lymphedema risk reduction education and therapeutic exercise.   Time  1    Period  Days    Status  Achieved            Plan - 06/27/17 1201    Clinical Impression Statement  Patient was diagnosed on 06/06/17 with right grade 3 invasive ductal carcinoma breast cancer. It measures 1 cm and is located in the upper outer quadrant. It is ER/PR negative and HER2 posiitve with a Ki67 of 30%. She has no other medical problems but has a sister who died of breast cancer and her Mom currently has a breast cancer recurrence. Her multidisiplinary medical team met prior to her assessments to determine a recommended treatment plan. She is planning to have genetic testing, a right lumpectomy and sentinel node biopsy, chemotherapy and radiation. If genetics are positive, she may decide to have a bilateral mastectomy. She will benefit from a post op PT assessment to determine PT needs.    History and Personal Factors relevant to plan of care:  None    Clinical Presentation  Stable    Clinical Decision Making  Low    Rehab Potential  Excellent    Clinical Impairments Affecting Rehab Potential  None    PT Frequency  -- Eval and 1 f/u visit    PT Treatment/Interventions  ADLs/Self Care Home Management;Therapeutic exercise;Patient/family education    PT Next Visit Plan  Will f/u 3-4 weeks post op to determine PT needs    PT Home Exercise Plan  Post op shoulder ROM HEP    Consulted and Agree with Plan of Care  Patient;Family member/caregiver    Family Member Consulted  Husband       Patient will benefit from skilled therapeutic intervention in order to improve the following deficits and impairments:  Impaired UE functional use, Decreased range of motion, Decreased knowledge of precautions, Postural dysfunction, Pain  Visit Diagnosis: Malignant neoplasm of  upper-outer quadrant of right breast in female, estrogen receptor negative ([place]) - Plan: PT plan of care cert/re-cert  Abnormal posture - Plan: PT plan of care cert/re-cert   Patient will follow up at outpatient cancer rehab 3-4 weeks following surgery.  If the patient requires physical therapy at that time, a specific plan will be dictated and sent to the referring physician for approval. The patient was educated today on appropriate basic range of motion exercises to begin post operatively and the importance of attending the After Breast Cancer class following surgery.  Patient was educated today on lymphedema risk reduction practices as it pertains to recommendations that will benefit the patient immediately following surgery.  She verbalized good understanding.      Problem List Patient Active Problem List   Diagnosis Date Noted  . Malignant neoplasm of upper-outer quadrant of right breast in female, estrogen receptor negative ([place]) 06/26/2017   Annia Friendly, PT 06/27/17 12:10 PM  [place], [place], [address] Phone: [phone]   Fax:  [phone]  Name: April  TAMEYA [name] MRN: [id number] Date of Birth: December 22, 1950

## 2017-06-28 ENCOUNTER — Telehealth: Payer: Self-pay | Admitting: Oncology

## 2017-06-28 ENCOUNTER — Inpatient Hospital Stay (HOSPITAL_BASED_OUTPATIENT_CLINIC_OR_DEPARTMENT_OTHER): Payer: Medicare Other | Admitting: Genetics

## 2017-06-28 DIAGNOSIS — Z171 Estrogen receptor negative status [ER-]: Secondary | ICD-10-CM

## 2017-06-28 DIAGNOSIS — C50411 Malignant neoplasm of upper-outer quadrant of right female breast: Secondary | ICD-10-CM

## 2017-06-28 DIAGNOSIS — Z803 Family history of malignant neoplasm of breast: Secondary | ICD-10-CM | POA: Diagnosis not present

## 2017-06-28 DIAGNOSIS — Z8041 Family history of malignant neoplasm of ovary: Secondary | ICD-10-CM

## 2017-06-28 NOTE — Telephone Encounter (Signed)
Scheduled appt per 1/09 los - Sent reminder letter in the mail with appt date and time.  

## 2017-06-29 ENCOUNTER — Encounter: Payer: Self-pay | Admitting: Genetics

## 2017-06-29 DIAGNOSIS — Z803 Family history of malignant neoplasm of breast: Secondary | ICD-10-CM | POA: Insufficient documentation

## 2017-06-29 DIAGNOSIS — Z8041 Family history of malignant neoplasm of ovary: Secondary | ICD-10-CM | POA: Insufficient documentation

## 2017-06-29 NOTE — Progress Notes (Signed)
REFERRING PROVIDER: Chauncey Cruel, MD [address] [place], [place] [address]  PRIMARY PROVIDER:  Lavone Orn, MD  PRIMARY REASON FOR VISIT:  1. Malignant neoplasm of upper-outer quadrant of right breast in female, estrogen receptor negative ([facility])   2. Family history of ovarian cancer   3. Family history of breast cancer     HISTORY OF PRESENT ILLNESS:   April [name], a 67 y.o. female, was seen for a [facility] cancer genetics consultation at the request of Dr. Jana Hakim due to a personal and family history of cancer.  April [name] presents to clinic today to discuss the possibility of a hereditary predisposition to cancer, genetic testing, and to further clarify her future cancer risks, as well as potential cancer risks for family members.   On 06/20/2017, at the age of 1, April [name] was diagnosed with grade 3 Invasive ductal carcinoma of the right breast; ER-, PR-, HER2 amplified.  She is currently considering surgical options at this time.  She plans to receive adjuvant chemotherapy and adjuvant radiation.    CANCER HISTORY:   No history exists.     HORMONAL RISK FACTORS:  Menarche was at age 12.  First live birth at age 42.  OCP use for approximately 9 years.  Ovaries intact: yes.  Hysterectomy: yes. 1980, hysterectomy (ovaries intact) Colonoscopy: yes; reportedly normal. Mammogram within the last year: yes.  Past Medical History:  Diagnosis Date  . Breast cancer ([facility])   . Eczema   . Family history of breast cancer   . Family history of ovarian cancer     Past Surgical History:  Procedure Laterality Date  . ABDOMINAL HYSTERECTOMY    . CESAREAN SECTION    . KNEE SURGERY      Social History   Socioeconomic History  . Marital status: Married    Spouse name: None  . Number of children: None  . Years of education: None  . Highest education level: None  Social Needs  . Financial resource strain: None  . Food insecurity - worry: None  . Food  insecurity - inability: None  . Transportation needs - medical: None  . Transportation needs - non-medical: None  Occupational History  . None  Tobacco Use  . Smoking status: Never Smoker  . Smokeless tobacco: Never Used  Substance and Sexual Activity  . Alcohol use: Yes  . Drug use: No  . Sexual activity: None  Other Topics Concern  . None  Social History Narrative  . None     FAMILY HISTORY:  We obtained a detailed, 4-generation family history.  Significant diagnoses are listed below: Family History  Problem Relation Age of Onset  . Breast cancer Mother 10       again at 32 in other breast   . Heart attack Father 58  . Breast cancer Sister 56  . Breast cancer Maternal Grandmother 56       spread to lungs, died at 48  . Ovarian cancer Cousin 58   April [name] had 4 children.  One daughter was stillborn and another son died at birth.  April [name] has a 5 year-old daughter who has no history of cancer and has a daughter. April [name] also has a 70 year-old daughter who has 2 sons.  April [name] oldest daughter reportedly had genetic testing for breast cancer risk that was negative.  April [name] has a sister who was diagnosed with breast cancer at 41 and who died at 69.  This sister  had a daughter who is now 25 and has had a benign breast lump. April [name] also has a son who his 83 with no history of cancer.  He has 1 son.   April [name] father died at 14 due to a heart attack.  April [name] has 2 paternal uncles and 3 paternal aunts with no known history of cancer.  She has several paternal cousins, but is not close with this side of the family and does not know about their health.  April [name] paternal grandfather died young (maybe 50's?) and she does not know the cause.  April [name] paternal grandmother died in her 96's/60's.    April [name] mother was diagnosed with breast cancer at 68 in the left breast and now at 33 has breast cancer in the right  breast.  April [name] has 4 maternal aunts and 2 maternal uncles with no history of cancer.  One of April [name]'s maternal aunts had a daughter who developed ovarian cancer at 42.  This cousin is now 67.  April [name] maternal grandmother had breast cancer that spread to her lungs.  She died from this cancer at 51.  Ms. Ewart maternal grandfather died at 57 with no history of cancer.   Patient's maternal ancestors are of African American/Caucasian descent, and paternal ancestors are of African American/Native American descent. There is no reported Ashkenazi Jewish ancestry. There is no known consanguinity.  GENETIC COUNSELING ASSESSMENT: April [name] is a 67 y.o. female with a personal and family history which is somewhat suggestive of a Hereditary Cancer Predisposition Syndrome. We, therefore, discussed and recommended the following at today's visit.   DISCUSSION: We reviewed the characteristics, features and inheritance patterns of hereditary cancer syndromes. We also discussed genetic testing, including the appropriate family members to test, the process of testing, insurance coverage and turn-around-time for results. We discussed the implications of a negative, positive and/or variant of uncertain significant result. In order to get genetic test results in a timely manner so that Ms. Fano can use these genetic test results for surgical decisions, we recommended Ms. Jaye pursue genetic testing for the Breast Cancer STAT Panel.  The STAT Breast cancer panel offered by [facility] includes sequencing and rearrangement analysis for the following 9 genes:  ATM, BRCA1, BRCA2, CDH1, CHEK2, PALB2, PTEN, STK11 and TP53.   Afterwards, we then recommend Ms. Wiemann pursue reflex genetic testing to the Common Hereditary Cancer gene panel. The Common Hereditary Cancer Panel offered by [facility] includes sequencing and/or deletion duplication testing of the following 47 genes: APC, ATM, AXIN2,  BARD1, BMPR1A, BRCA1, BRCA2, BRIP1, CDH1, CDKN2A (p14ARF), CDKN2A (p16INK4a), CKD4, CHEK2, CTNNA1, DICER1, EPCAM (Deletion/duplication testing only), GREM1 (promoter region deletion/duplication testing only), KIT, MEN1, MLH1, MSH2, MSH3, MSH6, MUTYH, NBN, NF1, NHTL1, PALB2, PDGFRA, PMS2, POLD1, POLE, PTEN, RAD50, RAD51C, RAD51D, SDHB, SDHC, SDHD, SMAD4, SMARCA4. STK11, TP53, TSC1, TSC2, and VHL.  The following genes were evaluated for sequence changes only: SDHA and HOXB13 c.251G>A variant only.  We discussed that only 5-10% of cancers are associated with a Hereditary cancer predisposition syndrome.  One of the most common hereditary cancer syndromes that increases breast cancer risk is called Hereditary Breast and Ovarian Cancer (HBOC) syndrome.  This syndrome is caused by mutations in the BRCA1 and BRCA2 genes.  This syndrome increases an individual's lifetime risk to develop breast, ovarian, pancreatic, and other types of cancer.  There are also many other cancer predisposition syndromes caused by mutations in several other genes.  We discussed  that if she is found to have a mutation in one of these genes, it may impact surgical decisions, and alter future medical management recommendations such as increased cancer screenings and consideration of risk reducing surgeries.  A positive result could also have implications for the patient's family members.  A Negative result would mean we were unable to identify a hereditary component to her cancer, but does not rule out the possibility of a hereditary basis for her cancer.  There could be mutations that are undetectable by current technology, or in genes not yet tested or identified to increase cancer risk.    We discussed the potential to find a Variant of Uncertain Significance or VUS.  These are variants that have not yet been identified as pathogenic or benign, and it is unknown if this variant is associated with increased cancer risk or if this is a  normal finding.  Most VUS's are reclassified to benign or likely benign.   It should not be used to make medical management decisions. With time, we suspect the lab will determine the significance of any VUS's identified if any.   Based on Ms. Zettlemoyer's personal and family history of cancer, she meets medical criteria for genetic testing. Despite that she meets criteria, she may still have an out of pocket cost. We discussed that if her out of pocket cost for testing is over $100, the laboratory will call and confirm whether she wants to proceed with testing.  If the out of pocket cost of testing is less than $100 she will be billed by the genetic testing laboratory.  We discussed that individuals with medicare typically pay $0 OOP.    PLAN: After considering the risks, benefits, and limitations, Ms. Friel  provided informed consent to pursue genetic testing and the blood sample was sent to [facility] for analysis of the Breast Cancer STAT Panel with plans to reflex to the Common Hereditary Cancer Panel. Preliminary results should be available within approximately 7-10 days' time, at which point they will be disclosed by telephone to Ms. Douty, as will any additional recommendations warranted by these results. Ms. Nicolson will receive a summary of her genetic counseling visit and a copy of her results once available. This information will also be available in Epic. We encouraged Ms. Lavalais to remain in contact with cancer genetics annually so that we can continuously update the family history and inform her of any changes in cancer genetics and testing that may be of benefit for her family. Ms. Beavin questions were answered to her satisfaction today. Our contact information was provided should additional questions or concerns arise.  Based on Ms. Zhong's family history, we recommended her brother, mother, and other maternal relatives have genetic testing as well.  Ms.  Kunz will let us know if we can be of any assistance in coordinating genetic counseling and/or testing for this family member.   Lastly, we encouraged Ms. Lusty to remain in contact with cancer genetics annually so that we can continuously update the family history and inform her of any changes in cancer genetics and testing that may be of benefit for this family.   Ms.  Dardis questions were answered to her satisfaction today. Our contact information was provided should additional questions or concerns arise. Thank you for the referral and allowing [place] to share in the care of your patient.   Tana Felts, MS Genetic Counselor [email or web address] phone: [phone]  The patient was seen for a total of 30  minutes in face-to-face genetic counseling.  The patient was accompanied today by her husband.  This patient was discussed with Drs. Magrinat, Lindi Adie and/or Burr Medico who agrees with the above.

## 2017-07-05 ENCOUNTER — Telehealth: Payer: Self-pay | Admitting: Genetics

## 2017-07-05 ENCOUNTER — Other Ambulatory Visit: Payer: Self-pay | Admitting: General Surgery

## 2017-07-05 ENCOUNTER — Telehealth: Payer: Self-pay | Admitting: *Deleted

## 2017-07-05 NOTE — Telephone Encounter (Signed)
Spoke to pt regarding [place] from 06/27/17. Denies questions or concerns regarding dx or treatment care plan. Pt had decided to have lumpectomy regardless if she has BRCA mutation. Encourage pt to call with needs. Received verbal understanding. Contact information provided.

## 2017-07-05 NOTE — Telephone Encounter (Signed)
Revealed negative genetic test results for the Breast cancer STAT panel- this are the genes with recommendations that might impact a woman's breast surgical decision.  We will still have to wait for the results from the full panel of genes.  I will call her with those results when they are available.

## 2017-07-06 ENCOUNTER — Encounter: Payer: Self-pay | Admitting: Genetics

## 2017-07-06 ENCOUNTER — Telehealth: Payer: Self-pay | Admitting: Genetics

## 2017-07-06 ENCOUNTER — Ambulatory Visit: Payer: Self-pay | Admitting: Genetics

## 2017-07-06 ENCOUNTER — Other Ambulatory Visit: Payer: Self-pay | Admitting: General Surgery

## 2017-07-06 DIAGNOSIS — Z1379 Encounter for other screening for genetic and chromosomal anomalies: Secondary | ICD-10-CM

## 2017-07-06 DIAGNOSIS — Z8041 Family history of malignant neoplasm of ovary: Secondary | ICD-10-CM

## 2017-07-06 DIAGNOSIS — Z171 Estrogen receptor negative status [ER-]: Secondary | ICD-10-CM

## 2017-07-06 DIAGNOSIS — Z803 Family history of malignant neoplasm of breast: Secondary | ICD-10-CM

## 2017-07-06 DIAGNOSIS — C50411 Malignant neoplasm of upper-outer quadrant of right female breast: Secondary | ICD-10-CM

## 2017-07-06 NOTE — Telephone Encounter (Signed)
Revealed the full panel genetic test results.  Revealed negative genetic testing.  Revealed that a VUS in MSH2 was identified.  We discussed that we did not find a hereditary cause for her cancer or the cancer in the family. It could be due to a different gene that we are not testing, or maybe our current technology may not be able to pick something up.  It will be important for her to keep in contact with genetics to learn if additional testing may be needed in the future.  We discussed it is recommended her siblings, nieces, nephews, and all maternal relatives have genetic testing as well because there could be a genetic cause for the cancer in her family that Ms. Geralds did not inherit and therefore was not detected in her.  These results will be forwarded to her surgeons office so she can schedule her surgery.

## 2017-07-06 NOTE — Progress Notes (Signed)
HPI: April [name] was previously seen in the [facility] clinic on 06/28/2017 due to a persoanl history of breast cancer and concerns regarding a hereditary predisposition to cancer. Please refer to our prior cancer genetics clinic note for more information regarding April [name]'s medical, social and family histories, and our assessment and recommendations, at the time. April [name] recent genetic test results were disclosed to her, as well as recommendations warranted by these results. These results and recommendations are discussed in more detail below.  CANCER HISTORY:    Malignant neoplasm of upper-outer quadrant of right breast in female, estrogen receptor negative ([facility])   06/26/2017 Initial Diagnosis    Malignant neoplasm of upper-outer quadrant of right breast in female, estrogen receptor negative ([facility])      07/05/2017 Genetic Testing    The patient had genetic testing due to a personal history of breast cancer and family history of breast and ovarian cancer.  The Common Hereditary Cancer Panel was ordered. The Common Hereditary Cancer Panel offered by [facility] includes sequencing and/or deletion duplication testing of the following 47 genes: APC, ATM, AXIN2, BARD1, BMPR1A, BRCA1, BRCA2, BRIP1, CDH1, CDKN2A (p14ARF), CDKN2A (p16INK4a), CKD4, CHEK2, CTNNA1, DICER1, EPCAM (Deletion/duplication testing only), GREM1 (promoter region deletion/duplication testing only), KIT, MEN1, MLH1, MSH2, MSH3, MSH6, MUTYH, NBN, NF1, NHTL1, PALB2, PDGFRA, PMS2, POLD1, POLE, PTEN, RAD50, RAD51C, RAD51D, SDHB, SDHC, SDHD, SMAD4, SMARCA4. STK11, TP53, TSC1, TSC2, and VHL.  The following genes were evaluated for sequence changes only: SDHA and HOXB13 c.251G>A variant only.  Results: No pathogenic variants identified.  A Variant of uncertain significance in the gene MSH2 was identified c.1331G>T (p.Arg444Leu).  The date of this test report is 07/05/2017.        FAMILY HISTORY:  We obtained a  detailed, 4-generation family history.  Significant diagnoses are listed below: Family History  Problem Relation Age of Onset  . Breast cancer Mother 52       again at 17 in other breast   . Heart attack Father 79  . Breast cancer Sister 74  . Breast cancer Maternal Grandmother 70       spread to lungs, died at 39  . Ovarian cancer Cousin 48   April [name] had 4 children.  One daughter was stillborn and another son died at birth.  April [name] has a 75 year-old daughter who has no history of cancer and has a daughter. April [name] also has a 44 year-old daughter who has 2 sons.  April [name] oldest daughter reportedly had genetic testing for breast cancer risk that was negative.  April [name] has a sister who was diagnosed with breast cancer at 44 and who died at 14.  This sister had a daughter who is now 1 and has had a benign breast lump. April [name] also has a son who his 44 with no history of cancer.  He has 1 son.   April [name] father died at 59 due to a heart attack.  April [name] has 2 paternal uncles and 3 paternal aunts with no known history of cancer.  She has several paternal cousins, but is not close with this side of the family and does not know about their health.  April [name] paternal grandfather died young (maybe 74's?) and she does not know the cause.  April [name] paternal grandmother died in her 51's/60's.    April [name] mother was diagnosed with breast cancer at 61 in the left breast and now at 20 has breast cancer in  the right breast.  April [name] has 4 maternal aunts and 2 maternal uncles with no history of cancer.  One of April [name]'s maternal aunts had a daughter who developed ovarian cancer at 36.  This cousin is now 65.  April [name] maternal grandmother had breast cancer that spread to her lungs.  She died from this cancer at 69.  April [name] maternal grandfather died at 20 with no history of cancer.   Patient's maternal ancestors  are of African American/Caucasian descent, and paternal ancestors are of African American/Native American descent. There is no reported Ashkenazi Jewish ancestry. There is no known consanguinity.  GENETIC TEST RESULTS: Genetic testing performed through [facility]'s Common Hereditary Cancer panel reported out on 07/05/2017 showed no pathogenic mutations. The Common Hereditary Cancer Panel offered by [facility] includes sequencing and/or deletion duplication testing of the following 47 genes: APC, ATM, AXIN2, BARD1, BMPR1A, BRCA1, BRCA2, BRIP1, CDH1, CDKN2A (p14ARF), CDKN2A (p16INK4a), CKD4, CHEK2, CTNNA1, DICER1, EPCAM (Deletion/duplication testing only), GREM1 (promoter region deletion/duplication testing only), KIT, MEN1, MLH1, MSH2, MSH3, MSH6, MUTYH, NBN, NF1, NHTL1, PALB2, PDGFRA, PMS2, POLD1, POLE, PTEN, RAD50, RAD51C, RAD51D, SDHB, SDHC, SDHD, SMAD4, SMARCA4. STK11, TP53, TSC1, TSC2, and VHL.  The following genes were evaluated for sequence changes only: SDHA and HOXB13 c.251G>A variant only..  A variant of uncertain significance (VUS) in a gene called [facility] was also noted. c.1331G>T (p.Arg444Leu)  The test report will be scanned into EPIC and will be located under the Molecular Pathology section of the Results Review tab.A portion of the result report is included below for reference.     We discussed with April [name] that because current genetic testing is not perfect, it is possible there may be a gene mutation in one of these genes that current testing cannot detect, but that chance is small. We also discussed, that there could be another gene that has not yet been discovered, or that we have not yet tested, that is responsible for the cancer diagnoses in the family. It is also possible there is a hereditary cause for the cancer in the family that Ms. Kamm did not inherit and therefore was not identified in her testing.  Therefore, it is important to remain in touch with cancer genetics in the  future so that we can continue to offer Ms. Baney the most up to date genetic testing.   Regarding the VUS in MSH2: At this time, it is unknown if this variant is associated with increased cancer risk or if this is a normal finding, but most variants such as this get reclassified to being inconsequential. It should not be used to make medical management decisions. With time, we suspect the lab will determine the significance of this variant, if any. If we do learn more about it, we will try to contact Ms. Scarfone to discuss it further. However, it is important to stay in touch with [place] periodically and keep the address and phone number up to date.  ADDITIONAL GENETIC TESTING: We discussed with Ms. Wirkkala that there are other genes that are associated with increased cancer risk that can be analyzed. The laboratories that offer this testing look at these additional genes via a hereditary cancer gene panel. Should Ms. Helman wish to pursue additional genetic testing, we are happy to discuss and coordinate this testing, at any time.    CANCER SCREENING RECOMMENDATIONS: This negative result means that we were unable to identify a hereditary cause for her personal and family history of cancer at this  time.  This result does not mean there is not a hereditary basis for her.      Families with features suggestive of hereditary risk for cancer tend to have multiple family members with cancer, diagnoses in multiple generations, and diagnoses before the age of 74. Ms. Bowell's family exhibits some of these features. Therefore this negative result may actually be due to the limitations of current technology to detect all mutations within these genes, or there may be a different gene that has not yet been discovered or tested.   Ms. Krizek is advised to follow all cancer screening and medical management recommendations provided to her by her physicians and other providers.   RECOMMENDATIONS FOR  FAMILY MEMBERS: Women in this family are at some increased risk of developing cancer, over the general population risk, simply due to the family history of cancer. We recommended women in this family have a yearly mammogram beginning at age 30, or 25 years younger than the earliest onset of cancer, an annual clinical breast exam, and perform monthly breast self-exams. Women in this family should also have a gynecological exam as recommended by their primary provider. All family members should have a colonoscopy by age 28.  All family members should inform their physicians about the family history of cancer so their doctors can make the most appropriate screening recommendations for them.   Based on Ms. Browning's family history, we recommended her siblings, nieces/nephews, mother, and all maternal relatives, have genetic counseling and testing. Ms. Spellman will let us know if we can be of any assistance in coordinating genetic counseling and/or testing for these family members. We discussed that if a mutation was not identified in Ms. Gabbert, she cannot pass on to her daughters mutations she does not have.  Therefore genetic testing is unlikely to be informative for her daughters in reference to their maternal family history. However, if there is a history of cancer on her daughter's father's side of the family testing for her daughters may be indicated.    FOLLOW-UP: Lastly, we discussed with Ms. Unrein that cancer genetics is a rapidly advancing field and it is possible that new genetic tests will be appropriate for her and/or her family members in the future. We encouraged her to remain in contact with cancer genetics on an annual basis so we can update her personal and family histories and let her know of advances in cancer genetics that may benefit this family.   Our contact number was provided. Ms. Mcilhenny questions were answered to her satisfaction, and she knows she is welcome to call us  at anytime with additional questions or concerns.   Ferol Luz, MS Genetic Counselor [email or web address]

## 2017-07-09 ENCOUNTER — Other Ambulatory Visit: Payer: Self-pay | Admitting: General Surgery

## 2017-07-09 DIAGNOSIS — Z170 Estrogen receptor positive status [ER+]: Secondary | ICD-10-CM

## 2017-07-09 DIAGNOSIS — C50111 Malignant neoplasm of central portion of right female breast: Secondary | ICD-10-CM

## 2017-07-11 ENCOUNTER — Other Ambulatory Visit: Payer: Self-pay

## 2017-07-11 ENCOUNTER — Encounter (HOSPITAL_BASED_OUTPATIENT_CLINIC_OR_DEPARTMENT_OTHER): Payer: Self-pay | Admitting: *Deleted

## 2017-07-16 NOTE — Progress Notes (Signed)
Ensure pre surgery drink given with instructions to complete by 0400 dos, surgical scrub given with instructions, pt verbalized understanding.

## 2017-07-17 ENCOUNTER — Ambulatory Visit (HOSPITAL_COMMUNITY): Payer: Medicare Other

## 2017-07-17 ENCOUNTER — Encounter (HOSPITAL_BASED_OUTPATIENT_CLINIC_OR_DEPARTMENT_OTHER)
Admission: RE | Disposition: A | Discharge status: Home / Self Care | Payer: Self-pay | Source: Ambulatory Visit | Attending: General Surgery

## 2017-07-17 ENCOUNTER — Encounter (HOSPITAL_BASED_OUTPATIENT_CLINIC_OR_DEPARTMENT_OTHER): Payer: Self-pay | Admitting: Anesthesiology

## 2017-07-17 ENCOUNTER — Ambulatory Visit (HOSPITAL_BASED_OUTPATIENT_CLINIC_OR_DEPARTMENT_OTHER): Payer: Medicare Other | Admitting: Anesthesiology

## 2017-07-17 ENCOUNTER — Ambulatory Visit (HOSPITAL_BASED_OUTPATIENT_CLINIC_OR_DEPARTMENT_OTHER)
Admission: RE | Admit: 2017-07-17 | Discharge: 2017-07-17 | Disposition: A | Discharge status: Home / Self Care | Payer: Medicare Other | Source: Ambulatory Visit | Attending: General Surgery | Admitting: General Surgery

## 2017-07-17 ENCOUNTER — Other Ambulatory Visit: Payer: Self-pay

## 2017-07-17 ENCOUNTER — Ambulatory Visit (HOSPITAL_COMMUNITY)
Admission: RE | Admit: 2017-07-17 | Discharge: 2017-07-17 | Disposition: A | Discharge status: Home / Self Care | Payer: Medicare Other | Source: Ambulatory Visit | Attending: General Surgery | Admitting: General Surgery

## 2017-07-17 DIAGNOSIS — Z171 Estrogen receptor negative status [ER-]: Secondary | ICD-10-CM | POA: Insufficient documentation

## 2017-07-17 DIAGNOSIS — Z90710 Acquired absence of both cervix and uterus: Secondary | ICD-10-CM | POA: Insufficient documentation

## 2017-07-17 DIAGNOSIS — Z79899 Other long term (current) drug therapy: Secondary | ICD-10-CM | POA: Diagnosis not present

## 2017-07-17 DIAGNOSIS — N6091 Unspecified benign mammary dysplasia of right breast: Secondary | ICD-10-CM | POA: Insufficient documentation

## 2017-07-17 DIAGNOSIS — Z95828 Presence of other vascular implants and grafts: Secondary | ICD-10-CM

## 2017-07-17 DIAGNOSIS — Z419 Encounter for procedure for purposes other than remedying health state, unspecified: Secondary | ICD-10-CM

## 2017-07-17 DIAGNOSIS — C50111 Malignant neoplasm of central portion of right female breast: Secondary | ICD-10-CM

## 2017-07-17 DIAGNOSIS — Z170 Estrogen receptor positive status [ER+]: Secondary | ICD-10-CM

## 2017-07-17 DIAGNOSIS — Z803 Family history of malignant neoplasm of breast: Secondary | ICD-10-CM | POA: Diagnosis not present

## 2017-07-17 HISTORY — DX: Essential (primary) hypertension: I10

## 2017-07-17 HISTORY — PX: PORTACATH PLACEMENT: SHX2246

## 2017-07-17 HISTORY — PX: BREAST LUMPECTOMY WITH RADIOACTIVE SEED AND SENTINEL LYMPH NODE BIOPSY: SHX6550

## 2017-07-17 SURGERY — BREAST LUMPECTOMY WITH RADIOACTIVE SEED AND SENTINEL LYMPH NODE BIOPSY
Anesthesia: General | Site: Chest | Laterality: Right

## 2017-07-17 MED ORDER — SODIUM CHLORIDE 0.9 % IJ SOLN
INTRAMUSCULAR | Status: AC
  Filled 2017-07-17: qty 10

## 2017-07-17 MED ORDER — ACETAMINOPHEN 500 MG PO TABS
1000.0000 mg | ORAL_TABLET | ORAL | Status: AC
  Administered 2017-07-17: 1000 mg via ORAL

## 2017-07-17 MED ORDER — FENTANYL CITRATE (PF) 100 MCG/2ML IJ SOLN
INTRAMUSCULAR | Status: AC
  Filled 2017-07-17: qty 2

## 2017-07-17 MED ORDER — BUPIVACAINE-EPINEPHRINE (PF) 0.5% -1:200000 IJ SOLN
INTRAMUSCULAR | Status: DC | PRN
  Administered 2017-07-17: 30 mL

## 2017-07-17 MED ORDER — EPHEDRINE SULFATE 50 MG/ML IJ SOLN: INTRAMUSCULAR | Status: DC | PRN

## 2017-07-17 MED ORDER — LIDOCAINE 2% (20 MG/ML) 5 ML SYRINGE
INTRAMUSCULAR | Status: AC
  Filled 2017-07-17: qty 5

## 2017-07-17 MED ORDER — GABAPENTIN 300 MG PO CAPS
300.0000 mg | ORAL_CAPSULE | ORAL | Status: AC
  Administered 2017-07-17: 300 mg via ORAL

## 2017-07-17 MED ORDER — HEPARIN (PORCINE) IN NACL 2-0.9 UNIT/ML-% IJ SOLN
INTRAMUSCULAR | Status: AC | PRN
  Administered 2017-07-17: 1 via INTRAVENOUS

## 2017-07-17 MED ORDER — DEXAMETHASONE SODIUM PHOSPHATE 10 MG/ML IJ SOLN
INTRAMUSCULAR | Status: AC
  Filled 2017-07-17: qty 1

## 2017-07-17 MED ORDER — PROPOFOL 500 MG/50ML IV EMUL
INTRAVENOUS | Status: AC
  Filled 2017-07-17: qty 50

## 2017-07-17 MED ORDER — LACTATED RINGERS IV SOLN
INTRAVENOUS | Status: DC
  Administered 2017-07-17 (×3): via INTRAVENOUS

## 2017-07-17 MED ORDER — HEPARIN (PORCINE) IN NACL 2-0.9 UNIT/ML-% IJ SOLN
INTRAMUSCULAR | Status: AC
  Filled 2017-07-17: qty 500

## 2017-07-17 MED ORDER — HEPARIN SOD (PORK) LOCK FLUSH 100 UNIT/ML IV SOLN
INTRAVENOUS | Status: DC | PRN
  Administered 2017-07-17: 400 [IU] via INTRAVENOUS

## 2017-07-17 MED ORDER — ENSURE PRE-SURGERY PO LIQD: 592.0000 mL | Freq: Once | ORAL | Status: DC

## 2017-07-17 MED ORDER — DEXAMETHASONE SODIUM PHOSPHATE 4 MG/ML IJ SOLN
INTRAMUSCULAR | Status: DC | PRN
  Administered 2017-07-17: 10 mg via INTRAVENOUS

## 2017-07-17 MED ORDER — ONDANSETRON HCL 4 MG/2ML IJ SOLN: 4.0000 mg | Freq: Once | INTRAMUSCULAR | Status: DC | PRN

## 2017-07-17 MED ORDER — CEFAZOLIN SODIUM-DEXTROSE 2-4 GM/100ML-% IV SOLN
INTRAVENOUS | Status: AC
  Filled 2017-07-17: qty 100

## 2017-07-17 MED ORDER — METHYLENE BLUE 0.5 % INJ SOLN
INTRAVENOUS | Status: AC
  Filled 2017-07-17: qty 10

## 2017-07-17 MED ORDER — MIDAZOLAM HCL 2 MG/2ML IJ SOLN
1.0000 mg | INTRAMUSCULAR | Status: DC | PRN
  Administered 2017-07-17: 1 mg via INTRAVENOUS

## 2017-07-17 MED ORDER — PHENYLEPHRINE HCL 10 MG/ML IJ SOLN
INTRAMUSCULAR | Status: DC | PRN
Start: 2017-07-17 — End: 2017-07-17
  Administered 2017-07-17 (×5): 80 ug via INTRAVENOUS

## 2017-07-17 MED ORDER — GABAPENTIN 300 MG PO CAPS
ORAL_CAPSULE | ORAL | Status: AC
  Filled 2017-07-17: qty 1

## 2017-07-17 MED ORDER — TECHNETIUM TC 99M SULFUR COLLOID FILTERED
1.0000 | Freq: Once | INTRAVENOUS | Status: AC | PRN
  Administered 2017-07-17: 1 via INTRADERMAL

## 2017-07-17 MED ORDER — SCOPOLAMINE 1 MG/3DAYS TD PT72: 1.0000 | MEDICATED_PATCH | Freq: Once | TRANSDERMAL | Status: DC | PRN

## 2017-07-17 MED ORDER — LIDOCAINE HCL (CARDIAC) 20 MG/ML IV SOLN
INTRAVENOUS | Status: DC | PRN
  Administered 2017-07-17: 30 mg via INTRAVENOUS

## 2017-07-17 MED ORDER — OXYCODONE HCL 5 MG PO TABS: 5.0000 mg | ORAL_TABLET | Freq: Four times a day (QID) | ORAL | 0 refills | Status: DC | PRN

## 2017-07-17 MED ORDER — METOPROLOL TARTRATE 5 MG/5ML IV SOLN
INTRAVENOUS | Status: AC
  Filled 2017-07-17: qty 5

## 2017-07-17 MED ORDER — PROPOFOL 10 MG/ML IV BOLUS
INTRAVENOUS | Status: DC | PRN
  Administered 2017-07-17: 50 mg via INTRAVENOUS
  Administered 2017-07-17: 100 mg via INTRAVENOUS

## 2017-07-17 MED ORDER — OXYCODONE HCL 5 MG PO TABS: 5.0000 mg | ORAL_TABLET | Freq: Once | ORAL | Status: DC | PRN

## 2017-07-17 MED ORDER — OXYCODONE HCL 5 MG/5ML PO SOLN: 5.0000 mg | Freq: Once | ORAL | Status: DC | PRN

## 2017-07-17 MED ORDER — FENTANYL CITRATE (PF) 100 MCG/2ML IJ SOLN: 25.0000 ug | INTRAMUSCULAR | Status: DC | PRN

## 2017-07-17 MED ORDER — BUPIVACAINE HCL (PF) 0.25 % IJ SOLN
INTRAMUSCULAR | Status: DC | PRN
  Administered 2017-07-17: 18 mL

## 2017-07-17 MED ORDER — GLYCOPYRROLATE 0.2 MG/ML IJ SOLN
INTRAMUSCULAR | Status: DC | PRN
  Administered 2017-07-17: 0.1 mg via INTRAVENOUS

## 2017-07-17 MED ORDER — FENTANYL CITRATE (PF) 100 MCG/2ML IJ SOLN
INTRAMUSCULAR | Status: DC | PRN
  Administered 2017-07-17: 100 ug via INTRAVENOUS

## 2017-07-17 MED ORDER — MIDAZOLAM HCL 5 MG/5ML IJ SOLN
INTRAMUSCULAR | Status: DC | PRN
  Administered 2017-07-17: 2 mg via INTRAVENOUS

## 2017-07-17 MED ORDER — FENTANYL CITRATE (PF) 100 MCG/2ML IJ SOLN
50.0000 ug | INTRAMUSCULAR | Status: DC | PRN
  Administered 2017-07-17 (×2): 50 ug via INTRAVENOUS

## 2017-07-17 MED ORDER — ONDANSETRON HCL 4 MG/2ML IJ SOLN
INTRAMUSCULAR | Status: DC | PRN
  Administered 2017-07-17: 4 mg via INTRAVENOUS

## 2017-07-17 MED ORDER — BUPIVACAINE HCL (PF) 0.25 % IJ SOLN
INTRAMUSCULAR | Status: AC
  Filled 2017-07-17: qty 30

## 2017-07-17 MED ORDER — CEFAZOLIN SODIUM-DEXTROSE 2-4 GM/100ML-% IV SOLN
2.0000 g | INTRAVENOUS | Status: AC
  Administered 2017-07-17: 2 g via INTRAVENOUS

## 2017-07-17 MED ORDER — HEPARIN SOD (PORK) LOCK FLUSH 100 UNIT/ML IV SOLN
INTRAVENOUS | Status: AC
  Filled 2017-07-17: qty 5

## 2017-07-17 MED ORDER — ACETAMINOPHEN 500 MG PO TABS
ORAL_TABLET | ORAL | Status: AC
  Filled 2017-07-17: qty 2

## 2017-07-17 MED ORDER — MIDAZOLAM HCL 2 MG/2ML IJ SOLN
INTRAMUSCULAR | Status: AC
  Filled 2017-07-17: qty 2

## 2017-07-17 MED ORDER — METOPROLOL TARTRATE 5 MG/5ML IV SOLN
INTRAVENOUS | Status: DC | PRN
  Administered 2017-07-17: 2.5 mg via INTRAVENOUS

## 2017-07-17 MED ORDER — ONDANSETRON HCL 4 MG/2ML IJ SOLN
INTRAMUSCULAR | Status: AC
Start: 2017-07-17 — End: ?
  Filled 2017-07-17: qty 2

## 2017-07-17 SURGICAL SUPPLY — 77 items
ADH SKN CLS APL DERMABOND .7 (GAUZE/BANDAGES/DRESSINGS) ×4
APL SKNCLS STERI-STRIP NONHPOA (GAUZE/BANDAGES/DRESSINGS)
APPLIER CLIP 9.375 MED OPEN (MISCELLANEOUS) ×3
APR CLP MED 9.3 20 MLT OPN (MISCELLANEOUS) ×2
BAG DECANTER FOR FLEXI CONT (MISCELLANEOUS) ×3 IMPLANT
BENZOIN TINCTURE PRP APPL 2/3 (GAUZE/BANDAGES/DRESSINGS) IMPLANT
BINDER BREAST LRG (GAUZE/BANDAGES/DRESSINGS) ×3 IMPLANT
BINDER BREAST MEDIUM (GAUZE/BANDAGES/DRESSINGS) IMPLANT
BINDER BREAST XLRG (GAUZE/BANDAGES/DRESSINGS) IMPLANT
BINDER BREAST XXLRG (GAUZE/BANDAGES/DRESSINGS) IMPLANT
BLADE SURG 11 STRL SS (BLADE) ×3 IMPLANT
BLADE SURG 15 STRL LF DISP TIS (BLADE) ×4 IMPLANT
BLADE SURG 15 STRL SS (BLADE) ×6
CANISTER SUC SOCK COL 7IN (MISCELLANEOUS) IMPLANT
CANISTER SUCT 1200ML W/VALVE (MISCELLANEOUS) ×3 IMPLANT
CHLORAPREP W/TINT 26ML (MISCELLANEOUS) ×3 IMPLANT
CLIP APPLIE 9.375 MED OPEN (MISCELLANEOUS) ×2 IMPLANT
CLIP VESOCCLUDE SM WIDE 6/CT (CLIP) IMPLANT
COVER BACK TABLE 60X90IN (DRAPES) ×3 IMPLANT
COVER MAYO STAND STRL (DRAPES) ×3 IMPLANT
COVER PROBE 5X48 (MISCELLANEOUS) ×2
COVER PROBE W GEL 5X96 (DRAPES) ×3 IMPLANT
DECANTER SPIKE VIAL GLASS SM (MISCELLANEOUS) IMPLANT
DERMABOND ADVANCED (GAUZE/BANDAGES/DRESSINGS) ×2
DERMABOND ADVANCED .7 DNX12 (GAUZE/BANDAGES/DRESSINGS) ×4 IMPLANT
DEVICE DUBIN W/COMP PLATE 8390 (MISCELLANEOUS) ×3 IMPLANT
DRAPE C-ARM 42X72 X-RAY (DRAPES) ×3 IMPLANT
DRAPE LAPAROSCOPIC ABDOMINAL (DRAPES) ×3 IMPLANT
DRAPE UTILITY XL STRL (DRAPES) ×3 IMPLANT
DRSG TEGADERM 4X4.75 (GAUZE/BANDAGES/DRESSINGS) IMPLANT
ELECT COATED BLADE 2.86 ST (ELECTRODE) ×6 IMPLANT
ELECT REM PT RETURN 9FT ADLT (ELECTROSURGICAL) ×3
ELECTRODE REM PT RTRN 9FT ADLT (ELECTROSURGICAL) ×2 IMPLANT
GAUZE SPONGE 4X4 12PLY STRL LF (GAUZE/BANDAGES/DRESSINGS) IMPLANT
GLOVE BIO SURGEON STRL SZ7 (GLOVE) ×6 IMPLANT
GLOVE BIOGEL PI IND STRL 7.0 (GLOVE) ×4 IMPLANT
GLOVE BIOGEL PI IND STRL 7.5 (GLOVE) ×2 IMPLANT
GLOVE BIOGEL PI INDICATOR 7.0 (GLOVE) ×2
GLOVE BIOGEL PI INDICATOR 7.5 (GLOVE) ×1
GLOVE SURG SS PI 6.5 STRL IVOR (GLOVE) ×6 IMPLANT
GOWN STRL REUS W/ TWL LRG LVL3 (GOWN DISPOSABLE) ×4 IMPLANT
GOWN STRL REUS W/TWL LRG LVL3 (GOWN DISPOSABLE) ×6
HEMOSTAT ARISTA ABSORB 3G PWDR (MISCELLANEOUS) ×3 IMPLANT
ILLUMINATOR WAVEGUIDE N/F (MISCELLANEOUS) IMPLANT
IV KIT MINILOC 20X1 SAFETY (NEEDLE) IMPLANT
KIT CVR 48X5XPRB PLUP LF (MISCELLANEOUS) ×2 IMPLANT
KIT MARKER MARGIN INK (KITS) ×3 IMPLANT
KIT PORT POWER 8FR ISP CVUE (Miscellaneous) ×3 IMPLANT
LIGHT WAVEGUIDE WIDE FLAT (MISCELLANEOUS) IMPLANT
NDL SAFETY ECLIPSE 18X1.5 (NEEDLE) IMPLANT
NEEDLE HYPO 18GX1.5 SHARP (NEEDLE)
NEEDLE HYPO 25X1 1.5 SAFETY (NEEDLE) ×3 IMPLANT
NS IRRIG 1000ML POUR BTL (IV SOLUTION) IMPLANT
PACK BASIN DAY SURGERY FS (CUSTOM PROCEDURE TRAY) ×3 IMPLANT
PENCIL BUTTON HOLSTER BLD 10FT (ELECTRODE) ×6 IMPLANT
SLEEVE SCD COMPRESS KNEE MED (MISCELLANEOUS) ×3 IMPLANT
SPONGE LAP 4X18 X RAY DECT (DISPOSABLE) ×6 IMPLANT
STRIP CLOSURE SKIN 1/2X4 (GAUZE/BANDAGES/DRESSINGS) ×3 IMPLANT
SUT ETHILON 2 0 FS 18 (SUTURE) IMPLANT
SUT MNCRL AB 4-0 PS2 18 (SUTURE) ×3 IMPLANT
SUT MON AB 4-0 PC3 18 (SUTURE) ×3 IMPLANT
SUT MON AB 5-0 PS2 18 (SUTURE) ×3 IMPLANT
SUT PROLENE 2 0 SH DA (SUTURE) ×3 IMPLANT
SUT SILK 2 0 SH (SUTURE) ×3 IMPLANT
SUT SILK 2 0 TIES 17X18 (SUTURE)
SUT SILK 2-0 18XBRD TIE BLK (SUTURE) IMPLANT
SUT VIC AB 2-0 SH 27 (SUTURE) ×6
SUT VIC AB 2-0 SH 27XBRD (SUTURE) ×4 IMPLANT
SUT VIC AB 3-0 SH 27 (SUTURE) ×6
SUT VIC AB 3-0 SH 27X BRD (SUTURE) ×4 IMPLANT
SUT VIC AB 5-0 PS2 18 (SUTURE) ×3 IMPLANT
SYR 5ML LUER SLIP (SYRINGE) ×3 IMPLANT
SYR CONTROL 10ML LL (SYRINGE) ×3 IMPLANT
TOWEL OR 17X24 6PK STRL BLUE (TOWEL DISPOSABLE) ×3 IMPLANT
TOWEL OR NON WOVEN STRL DISP B (DISPOSABLE) ×3 IMPLANT
TUBE CONNECTING 20X1/4 (TUBING) ×3 IMPLANT
YANKAUER SUCT BULB TIP NO VENT (SUCTIONS) ×3 IMPLANT

## 2017-07-17 NOTE — Progress Notes (Signed)
Assisted Dr Raechel Ache with right, ultrasound guided, pectoralis block. Side rails up, monitors on throughout procedure. See vital signs in flow sheet. Tolerated Procedure well.

## 2017-07-17 NOTE — Anesthesia Postprocedure Evaluation (Signed)
Anesthesia Post Note  Patient: April [name]  Procedure(s) Performed: BREAST LUMPECTOMY WITH RADIOACTIVE SEED AND SENTINEL LYMPH NODE BIOPSY (Right Breast) INSERTION PORT-A-CATH WITH [place] (N/A Chest)     Patient location during evaluation: PACU Anesthesia Type: General Level of consciousness: awake and alert Pain management: pain level controlled Vital Signs Assessment: post-procedure vital signs reviewed and stable Respiratory status: spontaneous breathing, nonlabored ventilation and respiratory function stable Cardiovascular status: blood pressure returned to baseline and stable Postop Assessment: no apparent nausea or vomiting Anesthetic complications: no    Last Vitals:  Vitals:   07/17/17 0736 07/17/17 0934  BP: (!) 158/81 (!) 153/82  Pulse: 75 92  Resp: 17 12  Temp:  (!) 36.4 C  SpO2: 99% 100%    Last Pain:  Vitals:   07/17/17 0736  TempSrc:   PainSc: 0-No pain                 Audry Pili

## 2017-07-17 NOTE — Anesthesia Procedure Notes (Signed)
Procedure Name: LMA Insertion Date/Time: 07/17/2017 7:46 AM Performed by: Marrianne Mood, CRNA Pre-anesthesia Checklist: Patient identified, Emergency Drugs available, Suction available, Patient being monitored and Timeout performed Patient Re-evaluated:Patient Re-evaluated prior to induction Oxygen Delivery Method: Circle system utilized Preoxygenation: Pre-oxygenation with 100% oxygen Induction Type: IV induction Ventilation: Mask ventilation without difficulty LMA: LMA inserted LMA Size: 4.0 Number of attempts: 1 Airway Equipment and Method: Bite block Placement Confirmation: positive ETCO2 Tube secured with: Tape Dental Injury: Teeth and Oropharynx as per pre-operative assessment

## 2017-07-17 NOTE — Anesthesia Preprocedure Evaluation (Signed)
Anesthesia Evaluation  Patient identified by MRN, date of birth, ID band Patient awake    Reviewed: Allergy & Precautions, NPO status , Patient's Chart, lab work & pertinent test results  Airway Mallampati: II  TM Distance: >3 FB Neck ROM: Full    Dental  (+) Dental Advisory Given   Pulmonary neg pulmonary ROS,    Pulmonary exam normal breath sounds clear to auscultation       Cardiovascular Normal cardiovascular exam Rhythm:Regular Rate:Normal  Hx gestational HTN in past   Neuro/Psych negative neurological ROS  negative psych ROS   GI/Hepatic negative GI ROS, Neg liver ROS,   Endo/Other  negative endocrine ROS  Renal/GU negative Renal ROS  negative genitourinary   Musculoskeletal negative musculoskeletal ROS (+)   Abdominal   Peds  Hematology negative hematology ROS (+)   Anesthesia Other Findings Right breast cancer  Reproductive/Obstetrics                             Anesthesia Physical Anesthesia Plan  ASA: III  Anesthesia Plan: General   Post-op Pain Management:  Regional for Post-op pain   Induction: Intravenous  PONV Risk Score and Plan: 4 or greater and Treatment may vary due to age or medical condition, Ondansetron, Dexamethasone and Scopolamine patch - Pre-op  Airway Management Planned: LMA  Additional Equipment: None  Intra-op Plan:   Post-operative Plan: Extubation in OR  Informed Consent: I have reviewed the patients History and Physical, chart, labs and discussed the procedure including the risks, benefits and alternatives for the proposed anesthesia with the patient or authorized representative who has indicated his/her understanding and acceptance.   Dental advisory given  Plan Discussed with: CRNA  Anesthesia Plan Comments:         Anesthesia Quick Evaluation

## 2017-07-17 NOTE — Anesthesia Procedure Notes (Signed)
Anesthesia Regional Block: Pectoralis block   Pre-Anesthetic Checklist: ,, timeout performed, Correct Patient, Correct Site, Correct Laterality, Correct Procedure, Correct Position, site marked, Risks and benefits discussed,  Surgical consent,  Pre-op evaluation,  At surgeon's request and post-op pain management  Laterality: Right  Prep: chloraprep       Needles:  Injection technique: Single-shot  Needle Type: Echogenic Needle     Needle Length: 9cm  Needle Gauge: 21     Additional Needles:   Narrative:  Start time: 07/17/2017 7:20 AM End time: 07/17/2017 7:25 AM Injection made incrementally with aspirations every 5 mL.  Performed by: Personally  Anesthesiologist: Audry Pili, MD  Additional Notes: No pain on injection. No increased resistance to injection. Injection made in 5cc increments. Good needle visualization. Patient tolerated the procedure well.

## 2017-07-17 NOTE — Transfer of Care (Signed)
Immediate Anesthesia Transfer of Care Note  Patient: April [name]  Procedure(s) Performed: BREAST LUMPECTOMY WITH RADIOACTIVE SEED AND SENTINEL LYMPH NODE BIOPSY (Right Breast) INSERTION PORT-A-CATH WITH [place] (N/A Chest)  Patient Location: PACU  Anesthesia Type:GA combined with regional for post-op pain  Level of Consciousness: awake and patient cooperative  Airway & Oxygen Therapy: Patient Spontanous Breathing and Patient connected to face mask oxygen  Post-op Assessment: Report given to RN and Post -op Vital signs reviewed and stable  Post vital signs: Reviewed and stable  Last Vitals:  Vitals:   07/17/17 0735 07/17/17 0736  BP: (!) 167/80 (!) 158/81  Pulse: 78 75  Resp: 18 17  Temp:    SpO2: 100% 99%    Last Pain:  Vitals:   07/17/17 0736  TempSrc:   PainSc: 0-No pain      Patients Stated Pain Goal: 3 (60/67/70 3403)  Complications: No apparent anesthesia complications

## 2017-07-17 NOTE — Discharge Instructions (Signed)
[facility] °Office Phone Number [phone] ° °POST OP INSTRUCTIONS ° °Always review your discharge instruction sheet given to you by the facility where your surgery was performed. ° °IF YOU HAVE DISABILITY OR FAMILY LEAVE FORMS, YOU MUST BRING THEM TO THE OFFICE FOR PROCESSING.  DO NOT GIVE THEM TO YOUR DOCTOR. ° °1. A prescription for pain medication may be given to you upon discharge.  Take your pain medication as prescribed, if needed.  If narcotic pain medicine is not needed, then you may take acetaminophen (Tylenol), naprosyn (Alleve) or ibuprofen (Advil) as needed. °2. Take your usually prescribed medications unless otherwise directed °3. If you need a refill on your pain medication, please contact your pharmacy.  They will contact our office to request authorization.  Prescriptions will not be filled after 5pm or on week-ends. °4. You should eat very light the first 24 hours after surgery, such as soup, crackers, pudding, etc.  Resume your normal diet the day after surgery. °5. Most patients will experience some swelling and bruising in the breast.  Ice packs and a good support bra will help.  Wear the breast binder provided or a sports bra for 72 hours day and night.  After that wear a sports bra during the day until you return to the office. Swelling and bruising can take several days to resolve.  °6. It is common to experience some constipation if taking pain medication after surgery.  Increasing fluid intake and taking a stool softener will usually help or prevent this problem from occurring.  A mild laxative (Milk of Magnesia or Miralax) should be taken according to package directions if there are no bowel movements after 48 hours. °7. Unless discharge instructions indicate otherwise, you may remove your bandages 48 hours after surgery and you may shower at that time.  You may have steri-strips (small skin tapes) in place directly over the incision.  These strips should be left on the  skin for 7-10 days and will come off on their own.  If your surgeon used skin glue on the incision, you may shower in 24 hours.  The glue will flake off over the next 2-3 weeks.  Any sutures or staples will be removed at the office during your follow-up visit. °8. ACTIVITIES:  You may resume regular daily activities (gradually increasing) beginning the next day.  Wearing a good support bra or sports bra minimizes pain and swelling.  You may have sexual intercourse when it is comfortable. °a. You may drive when you no longer are taking prescription pain medication, you can comfortably wear a seatbelt, and you can safely maneuver your car and apply brakes. °b. RETURN TO WORK:  ______________________________________________________________________________________ °9. You should see your doctor in the office for a follow-up appointment approximately two weeks after your surgery.  Your doctor’s nurse will typically make your follow-up appointment when she calls you with your pathology report.  Expect your pathology report 3-4 business days after your surgery.  You may call to check if you do not hear from us after three days. °10. OTHER INSTRUCTIONS: _______________________________________________________________________________________________ _____________________________________________________________________________________________________________________________________ °_____________________________________________________________________________________________________________________________________ °_____________________________________________________________________________________________________________________________________ ° °WHEN TO CALL DR WAKEFIELD: °1. Fever over 101.0 °2. Nausea and/or vomiting. °3. Extreme swelling or bruising. °4. Continued bleeding from incision. °5. Increased pain, redness, or drainage from the incision. ° °The clinic staff is available to answer your questions during regular  business hours.  Please don’t hesitate to call and ask to speak to one of the nurses for clinical concerns.  If   you have a medical emergency, go to the nearest emergency room or call 911.  A surgeon from [facility] is always on call at the hospital. ° °For further questions, please visit [email or web address] ° ° ° ° ° °Post Anesthesia Home Care Instructions ° °Activity: °Get plenty of rest for the remainder of the day. A responsible individual must stay with you for 24 hours following the procedure.  °For the next 24 hours, DO NOT: °-Drive a car °-Operate machinery °-Drink alcoholic beverages °-Take any medication unless instructed by your physician °-Make any legal decisions or sign important papers. ° °Meals: °Start with liquid foods such as gelatin or soup. Progress to regular foods as tolerated. Avoid greasy, spicy, heavy foods. If nausea and/or vomiting occur, drink only clear liquids until the nausea and/or vomiting subsides. Call your physician if vomiting continues. ° °Special Instructions/Symptoms: °Your throat may feel dry or sore from the anesthesia or the breathing tube placed in your throat during surgery. If this causes discomfort, gargle with warm salt water. The discomfort should disappear within 24 hours. ° °If you had a scopolamine patch placed behind your ear for the management of post- operative nausea and/or vomiting: ° °1. The medication in the patch is effective for 72 hours, after which it should be removed.  Wrap patch in a tissue and discard in the trash. Wash hands thoroughly with soap and water. °2. You may remove the patch earlier than 72 hours if you experience unpleasant side effects which may include dry mouth, dizziness or visual disturbances. °3. Avoid touching the patch. Wash your hands with soap and water after contact with the patch. °  ° °

## 2017-07-17 NOTE — Op Note (Addendum)
        Preoperative diagnosis: Right breast cancer, clinical stage I Postoperative diagnosis: same as above Procedure:Right breast seed guided lumpectomy Rightdeep axillary sentinel node biopsy Right IJ US guided port placement Surgeon: Dr Serita Grammes PHX:[id number] Anes: general  Specimens  1. rightbreast tissue marked with paint 2.rightaxillary sentinel nodes with highest count 805 3. Additional superior, inferior and medial margins marked short superior, long lateral and double deep Complications none Drains none Sponge count correct Dispo to pacu stable  Indications: This is a21 yof with new right breast cancer. She was seen in [place].  We discussed options and have elected to proceed with lumpectomy and sentinel node biopsy with port placement. She had radioactive seed placed prior to beginning and the mammograms were available for review.  Procedure: After informed consent was obtained the patient was taken to the operating room. She first was given technetium in standard periareolar fashion. She had a pectoral block. She was given antibiotics. Sequential compression devices were on her legs. She was then placed under general anesthesia with an LMA. Then she was prepped and draped in the standard sterile surgical fashion. Surgical timeout was then performed. I then located the seed in the central right breastI infiltrated marcaine in the skin and then madea periareolar incisionto attempt to hide the scar.I then used the neoprobe to remove the seed and the surrounding tissue with attempt to get clear margins. I marked this with paint. MM confirmed removal of seed and theclip.I did remove some extra margins as above. I placed clips in the cavity.I then obtained hemostasis. This was marked as above.I closed with with 2-0 vicryl to approximate breast tissue. The skin was closed with 3-0 vicryl and 5-0 monocryl. Glue and steristrips were placed.  I then made a low  axillary incision after locating the sentinel node. Icarried this through the axillary fascia.there was radioactivity present that was easily noted. I removed the radioactive nodes.The background radioactivity was minimal.I then obtained hemostasis. I closed the axillary fascia with 2-0 vicryl.The skin was closed with 3-0 vicryl and 4-0 monocryl. Glue and steristrips were applied.   I used the ultrasound to identify the right internal jugular vein. I then accessed the vein using the ultrasound.This aspirated blood. I then placed the wire. This was confirmed by fluoroscopy and ultrasound to be in the correct position.I tunneled the line between the 2 sites.  I then dilated the tract and placed the dilator assembly with the sheath. This was done under fluoroscopy. I then removed the sheath and dilator. The wire was also removed. The line was then pulled back to be in the venacava. I hooked this up to the port. I sutured this into place with 2-0 Prolene in 2 places. This aspirated blood and flushed easily.This was confirmed with a final fluoroscopy. I then closed this with 2-0 Vicryl and 4-0 Monocryl.This withdrew blood and I placed heparin in it. Dermabond was placed on both the incisions.A dressing was placed. She tolerated this well and was transferred to the recovery room in stable condition

## 2017-07-17 NOTE — H&P (Signed)
67 yof referred by Dr Laurann Montana for new right breast cancer. she has family history of breast cancer in her mom at age 68 (who has just had another one also) and her sister at age 14. she had no mass or dc. she underwent screening mm that shows a right sided architectural distortion. on [place] there is a 1 cm mass at 10 and a 4 mm mass at 11 oclock. the axillary [place] is negative. two biopsies were done and the clips were 1.5 cm apart. the biopsy of the 4 mm lesion is fcc and concordant. the biopsy of the 1 cm mass is grade III IDC that is er/pr negative, her 2 positive and Ki is 30%. genetic testing has been negative.   Past Surgical History Tawni Pummel, RN; 06/27/2017 7:28 AM) Breast Biopsy  Right. Hysterectomy (not due to cancer) - Partial   Diagnostic Studies History Tawni Pummel, RN; 06/27/2017 7:28 AM) Colonoscopy  within last year Mammogram  within last year Pap Smear  1-5 years ago  Medication History Tawni Pummel, RN; 06/27/2017 7:28 AM) Medications Reconciled  Social History Tawni Pummel, RN; 06/27/2017 7:28 AM) Alcohol use  Occasional alcohol use. Caffeine use  Carbonated beverages, Coffee, Tea. No drug use  Tobacco use  Never smoker.  Family History Tawni Pummel, RN; 06/27/2017 7:28 AM) Breast Cancer  Family Members In General, Mother, Sister. Depression  Mother. Heart Disease  Father. Hypertension  Father. Melanoma  Mother. Respiratory Condition  Brother. Thyroid problems  Mother.  Pregnancy / Birth History Tawni Pummel, RN; 06/27/2017 7:28 AM) Age at menarche  58 years. Age of menopause  >59 Contraceptive History  Oral contraceptives. Gravida  4 Length (months) of breastfeeding  3-6 Maternal age  10-20 Para  2 Regular periods   Other Problems Tawni Pummel, RN; 06/27/2017 7:28 AM) Back Pain  Bladder Problems  Breast Cancer  Hemorrhoids  Lump In Breast    Review of Systems Rolm Bookbinder MD; 06/27/2017 2:13 PM) Breast  Present- Breast Mass. All other systems negative  Physical Exam Rolm Bookbinder MD; 06/27/2017 2:09 PM) General Mental Status-Alert. Orientation-Oriented X3. Head and Neck Trachea-midline. Thyroid Gland Characteristics - normal size and consistency. Eye Sclera/Conjunctiva - Bilateral-No scleral icterus. Chest and Lung Exam Chest and lung exam reveals -quiet, even and easy respiratory effort with no use of accessory muscles and on auscultation, normal breath sounds, no adventitious sounds and normal vocal resonance. Breast Nipples-No Discharge. Breast Lump-No Palpable Breast Mass. Cardiovascular Cardiovascular examination reveals -normal heart sounds, regular rate and rhythm with no murmurs. Abdomen Note: soft no hepatomegaly Lymphatic Head & Neck General Head & Neck Lymphatics: Bilateral - Description - Normal. Axillary General Axillary Region: Bilateral - Description - Normal. Note: no [place] adenopathy   Assessment & Plan Rolm Bookbinder MD; 06/27/2017 2:21 PM) CANCER OF CENTRAL PORTION OF RIGHT BREAST (C50.111) Right breast seed guided lumpectomy, right axillary sn biopsy port placement We discussed the staging and pathophysiology of breast cancer. We discussed all of the different options for treatment for breast cancer including surgery, chemotherapy, radiation therapy, Herceptin, and antiestrogen therapy. We discussed a sentinel lymph node biopsy as she does not appear to having lymph node involvement right now. We discussed the performance of that with injection of radioactive tracer.We discussed that there is a chance of having a positive node with a sentinel lymph node biopsy and we will await the permanent pathology to make any other first further decisions in terms of her treatment. We discussed up to a 5% risk lifetime  of chronic shoulder pain as well as lymphedema associated with a sentinel lymph node biopsy. We discussed the options for treatment of  the breast cancer which included lumpectomy versus a mastectomy. We discussed the performance of the lumpectomy with radioactive seed placement. We discussed a 5% chance of a positive margin requiring reexcision in the operating room. We also discussed that she will need radiation therapy if she undergoes lumpectomy. We discussed mastectomy and the postoperative care for that as well. Mastectomy can be followed by reconstruction. This is a more extensive surgery and requires more recovery. The decision for lumpectomy vs mastectomy has no impact on decision for chemotherapy. Most mastectomy patients will not need radiation therapy. We discussed that there is no difference in her survival whether she undergoes lumpectomy with radiation therapy or antiestrogen therapy versus a mastectomy. There is also no real difference between her recurrence in the breast. We discussed the risks of operation including bleeding, infection, possible reoperation. She understands her further therapy based on pathology

## 2017-07-18 ENCOUNTER — Encounter (HOSPITAL_BASED_OUTPATIENT_CLINIC_OR_DEPARTMENT_OTHER): Payer: Self-pay | Admitting: General Surgery

## 2017-07-18 NOTE — Addendum Note (Signed)
Addendum  created 07/18/17 0849 by Tawni Millers, CRNA   Charge Capture section accepted

## 2017-07-24 NOTE — Progress Notes (Signed)
[place]  [phone] [phone]     ID: PLESHETTE TOMASINI DOB: September 25, 1950  MR#: [id number]  VQX#:[id number]  Patient Care Team: Kelton Pillar, MD as PCP - General (Family Medicine) Magrinat, Virgie Dad, MD as Consulting Physician (Oncology) Rolm Bookbinder, MD as Consulting Physician (General Surgery) Eppie Gibson, MD as Attending Physician (Radiation Oncology) Newt Minion, MD as Consulting Physician (Orthopedic Surgery) Regal, Tamala Fothergill, DPM as Consulting Physician (Podiatry) Neldon Mc, Donnamarie Poag, MD as Consulting Physician (Allergy and Immunology) Soijett A Blue OTHER MD:  CHIEF COMPLAINT: Estrogen receptor negative breast cancer  CURRENT TREATMENT: Awaiting definitive surgery   HISTORY OF CURRENT ILLNESS: From the original intake note:  "Jackelyne" had bilateral screening mammography at [facility] 06/06/2017.  This showed a possible mass in the right breast at the 11 o'clock position.  On 06/13/2017 she underwent right diagnostic mammography and ultrasonography.  Breast density was category C.  In the right breast at the 11 o'clock position there was a 1 cm area by mammography.  By ultrasound this confirmed a 1.0 cm irregular mass with lobulated margins in the upper outer quadrant of the right breast.  There was a second, 0.4 cm lobulated mass in the same quadrant.  The right axilla was sonographically benign.  On 06/20/2017 biopsy of the 2 right breast masses in question was performed.  The final pathology (SAA [id number]) found the smaller mass to be only fibrocystic change.  This is felt to be concordant.  The larger mass however was an invasive ductal carcinoma, grade 3, estrogen and progesterone receptor negative, with an MIB-1 of 30%, and HER-2 amplified, with a signals ratio of 2.24.  The number per cell was 4.60.  The patient's subsequent history is as detailed below.  INTERVAL HISTORY: Nadelyn returns to the office today for follow up of her  estrogen receptor negative breast cancer. She reports that her surgery went well and she didn't have any pain following. She only took Aleve for a headache following the surgery. She denies bleeding or pain.   Since her last visit to the office, she underwent genetic testing on 06/27/2017 with results of: Variant of Uncertain Significance identified in MSH2.  She had a right breast lumpectomy completed on 07/17/2017 with results showing: Breast, lumpectomy, Right with invasive ductal carcinoma, grade II/III, spanning 1.6 CM. Ductal carcinoma in situ, low grade. The surgical resection margins are negative for carcinoma. Breast, excision, Right additional medial margin with radial scar. Usual ductal hyperplasia. There is no evidence of malignancy. Breast, excision, Right additional superior margin with radial scar(s). Fibrocystic changes. Usual ductal hyperplasia. There is no evidence of malignancy. Breast, excision, Right additional inferior margin with benign breast parenchyma. There is no evidence of malignancy. Of the 6 lymph nodes biopsied, 5 were without evidence of carcinoma. 1 showed benign fibroadipose tissue with no lymph nodal tissues identified.    REVIEW OF SYSTEMS: Deborha reports that she now walks 30 minutes a day averaging 5,000 steps on her pedometer and she has been eating better. She denies any neck pain following port placement. She denies unusual headaches, visual changes, nausea, vomiting, or dizziness. There has been no unusual cough, phlegm production, or pleurisy. This been no change in bowel or bladder habits. She denies unexplained fatigue or unexplained weight loss, bleeding, rash, or fever. A detailed review of systems was otherwise stable.     PAST MEDICAL HISTORY: Past Medical History:  Diagnosis Date  . Breast cancer ([place]) 05/2017   right breast  .  Eczema   . Family history of breast cancer   . Family history of ovarian cancer   . Hypertension    toxemia during  pregnancy, no meds now    PAST SURGICAL HISTORY: Past Surgical History:  Procedure Laterality Date  . ABDOMINAL HYSTERECTOMY    . BREAST LUMPECTOMY WITH RADIOACTIVE SEED AND SENTINEL LYMPH NODE BIOPSY Right 07/17/2017   Procedure: BREAST LUMPECTOMY WITH RADIOACTIVE SEED AND SENTINEL LYMPH NODE BIOPSY;  Surgeon: Rolm Bookbinder, MD;  Location: [facility];  Service: General;  Laterality: Right;  . CESAREAN SECTION     x4  . DILATION AND CURETTAGE OF UTERUS    . KNEE SURGERY Right   . PORTACATH PLACEMENT N/A 07/17/2017   Procedure: INSERTION PORT-A-CATH WITH [place];  Surgeon: Rolm Bookbinder, MD;  Location: [facility];  Service: General;  Laterality: N/A;    FAMILY HISTORY Family History  Problem Relation Age of Onset  . Breast cancer Mother 34       again at 67 in other breast   . Heart attack Father 19  . Breast cancer Sister 70  . Breast cancer Maternal Grandmother 91       spread to lungs, died at 87  . Ovarian cancer Cousin 22  The patient's father died at age 32 from a heart attack.  The patient's mother is currently living at age 78 (as of January 2019).  The patient had 1 sister who was diagnosed with breast cancer at age 72 and died from metastatic disease at age 80.  The patient has 1 brother.  In addition the patient's mother was diagnosed with breast cancer at age 59, on the left side, and now has a right-sided breast cancer diagnosed in January 2019.  There is in addition a cousin with ovarian cancer diagnosed when she was 68 years old  GYNECOLOGIC HISTORY:  No LMP recorded. Patient has had a hysterectomy. Menarche age 2, first live birth age 23, the patient had 3 live births, 1 of whom survived only 2 days.  She underwent hysterectomy without salpingo-oophorectomy September 05, 1978.  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  She is now retired.  Her husband [name] his Theme park manager at [employer].  The patient's daughter [name] lives in [place] where she is an Tourist information centre manager.  The patient's daughter [name] lives in [place] working for the department of defense.  The patient has 3 grandchildren.     ADVANCED DIRECTIVES: Not in place   HEALTH MAINTENANCE: Social History   Tobacco Use  . Smoking status: Never Smoker  . Smokeless tobacco: Never Used  Substance Use Topics  . Alcohol use: Yes    Comment: social  . Drug use: No     Colonoscopy: April 2018/[facility]  PAP: Status post hysterectomy  Bone density:   Allergies  Allergen Reactions  . Tape Itching and Rash  . Nsaids   . Sulfa Antibiotics Rash    Current Outpatient Medications  Medication Sig Dispense Refill  . CALCIUM-VITAMIN D PO Take 1 tablet by mouth daily.    Marland Kitchen GENERIC EXTERNAL MEDICATION Take 1 tablet by mouth daily.    Marland Kitchen GENERIC EXTERNAL MEDICATION Take 16 oz by mouth daily.    Marland Kitchen GENERIC EXTERNAL MEDICATION Take 8 oz by mouth daily.    . Multiple Vitamin (MULTIVITAMIN WITH MINERALS) TABS  tablet Take 1 tablet by mouth daily.    Marland Kitchen oxyCODONE (OXY IR/ROXICODONE) 5 MG immediate release tablet Take 1 tablet (5 mg total) by mouth every 6 (six) hours as needed for moderate pain, severe pain or breakthrough pain. 12 tablet 0  . vitamin B-12 (CYANOCOBALAMIN) 1000 MCG tablet Take 1,000 mcg by mouth daily.     No current facility-administered medications for this visit.     OBJECTIVE: Middle-aged African-American woman who appears well  Vitals:   07/27/17 0945  BP: (!) 196/75  Pulse: 70  Resp: 18  Temp: 98.6 F (37 C)  SpO2: 100%     Body mass index is 26.63 kg/m.   Wt Readings from Last 3 Encounters:  07/27/17 152 lb 11.2 oz (69.3 kg)  07/17/17 152 lb (68.9 kg)  06/27/17 151 lb (68.5 kg)      ECOG FS:0 - Asymptomatic  Ocular: Sclerae unicteric, pupils round and equal Ear-nose-throat:  Oropharynx clear and moist Lymphatic: No cervical or supraclavicular adenopathy Lungs no rales or rhonchi Heart regular rate and rhythm Abd soft, nontender, positive bowel sounds MSK no focal spinal tenderness, no joint edema Neuro: non-focal, well-oriented, appropriate affect Breasts: The right breast is status post recent biopsy.  There is a mild ecchymosis.  I do not palpate a mass.  There are no skin or nipple changes of concern.  Left breast is unremarkable.  Both axillae are benign   LAB RESULTS:  CMP     Component Value Date/Time   NA 142 06/27/2017 0843   K 4.1 06/27/2017 0843   CL 106 06/27/2017 0843   CO2 26 06/27/2017 0843   GLUCOSE 96 06/27/2017 0843   BUN 14 06/27/2017 0843   CREATININE 1.01 06/27/2017 0843   CALCIUM 9.6 06/27/2017 0843   PROT 7.7 06/27/2017 0843   ALBUMIN 4.0 06/27/2017 0843   AST 16 06/27/2017 0843   ALT 14 06/27/2017 0843   ALKPHOS 77 06/27/2017 0843   BILITOT 0.2 06/27/2017 0843   GFRNONAA 56 (L) 06/27/2017 0843   GFRAA >60 06/27/2017 0843    No results found for: TOTALPROTELP, ALBUMINELP, A1GS, A2GS, BETS, BETA2SER, GAMS, MSPIKE, SPEI  No results found for: KPAFRELGTCHN, LAMBDASER, KAPLAMBRATIO  Lab Results  Component Value Date   WBC 4.2 06/27/2017   NEUTROABS 1.8 06/27/2017   HCT 37.5 06/27/2017   MCV 88.8 06/27/2017   PLT 274 06/27/2017    '@LASTCHEMISTRY' @  No results found for: LABCA2  No components found for: DHRCBU384  No results for input(s): INR in the last 168 hours.  No results found for: LABCA2  No results found for: TXM468  No results found for: EHO122  No results found for: QMG500  No results found for: CA2729  No components found for: HGQUANT  No results found for: CEA1 / No results found for: CEA1   No results found for: AFPTUMOR  No results found for: CHROMOGRNA  No results found for: PSA1  No visits with results within 3 Day(s) from this visit.  Latest known visit with results is:    Appointment on 06/27/2017  Component Date Value Ref Range Status  . Sodium 06/27/2017 142  136 - 145 mmol/L Final  . Potassium 06/27/2017 4.1  3.3 - 4.7 mmol/L Final  . Chloride 06/27/2017 106  98 - 109 mmol/L Final  . CO2 06/27/2017 26  22 - 29 mmol/L Final  . Glucose, Bld 06/27/2017 96  70 - 140 mg/dL Final  . BUN 06/27/2017 14  7 - 26 mg/dL  Final  . Creatinine 06/27/2017 1.01  0.70 - 1.30 mg/dL Final  . Calcium 06/27/2017 9.6  8.4 - 10.4 mg/dL Final  . Total Protein 06/27/2017 7.7  6.4 - 8.3 g/dL Final  . Albumin 06/27/2017 4.0  3.5 - 5.0 g/dL Final  . AST 06/27/2017 16  5 - 34 U/L Final  . ALT 06/27/2017 14  0 - 55 U/L Final  . Alkaline Phosphatase 06/27/2017 77  40 - 150 U/L Final  . Total Bilirubin 06/27/2017 0.2  0.2 - 1.2 mg/dL Final  . GFR, Est Non Af Am 06/27/2017 56* >60 mL/min Final  . GFR, Est AFR Am 06/27/2017 >60  >60 mL/min Final   Comment: (NOTE) The eGFR has been calculated using the CKD EPI equation. This calculation has not been validated in all clinical situations. eGFR's persistently <60 mL/min signify possible Chronic Kidney Disease.   Georgiann Hahn gap 06/27/2017 10  3 - 11 Final   Performed at [facility], [place] [address] [place], [place] [address]  . WBC Count 06/27/2017 4.2  4.0 - 10.3 K/uL Final  . RBC 06/27/2017 4.22  3.70 - 5.45 MIL/uL Final  . Hemoglobin 06/27/2017 12.2  11.6 - 15.9 g/dL Final  . HCT 06/27/2017 37.5  34.8 - 46.6 % Final  . MCV 06/27/2017 88.8  79.5 - 101.0 fL Final  . MCH 06/27/2017 28.9  25.1 - 34.0 pg Final  . MCHC 06/27/2017 32.6  31.5 - 36.0 g/dL Final  . RDW 06/27/2017 13.3  11.2 - 16.1 % Final  . Platelet Count 06/27/2017 274  145 - 400 K/uL Final  . Neutrophils Relative % 06/27/2017 43  % Final  . Neutro Abs 06/27/2017 1.8  1.5 - 6.5 K/uL Final  . Abs Granulocyte 06/27/2017 1.8  1.5 - 6.5 K/uL Final  . Lymphocytes Relative 06/27/2017 43  % Final  . Lymphs Abs 06/27/2017 1.8  0.9 - 3.3 K/uL Final  .  Monocytes Relative 06/27/2017 9  % Final  . Monocytes Absolute 06/27/2017 0.4  0.1 - 0.9 K/uL Final  . Eosinophils Relative 06/27/2017 4  % Final  . Eosinophils Absolute 06/27/2017 0.2  0.0 - 0.5 K/uL Final  . Basophils Relative 06/27/2017 1  % Final  . Basophils Absolute 06/27/2017 0.0  0.0 - 0.1 K/uL Final   Performed at [facility], [place] [address] [place], [place] [address]    (this displays the last labs from the last 3 days)  No results found for: TOTALPROTELP, ALBUMINELP, A1GS, A2GS, BETS, BETA2SER, GAMS, MSPIKE, SPEI (this displays SPEP labs)  No results found for: KPAFRELGTCHN, LAMBDASER, KAPLAMBRATIO (kappa/lambda light chains)  No results found for: HGBA, HGBA2QUANT, HGBFQUANT, HGBSQUAN (Hemoglobinopathy evaluation)   No results found for: LDH  No results found for: IRON, TIBC, IRONPCTSAT (Iron and TIBC)  No results found for: FERRITIN  Urinalysis No results found for: COLORURINE, APPEARANCEUR, LABSPEC, PHURINE, GLUCOSEU, HGBUR, BILIRUBINUR, KETONESUR, PROTEINUR, UROBILINOGEN, NITRITE, LEUKOCYTESUR   STUDIES: Outside studies reviewed  ELIGIBLE FOR AVAILABLE RESEARCH PROTOCOL: no  ASSESSMENT: 67 y.o. [place] woman status post right breast upper outer quadrant biopsy 06/20/2017 for a clinical T1b N0, stage IA invasive ductal carcinoma, grade 3, estrogen and progesterone receptor negative, but HER-2 amplified, with an MIB-1 of 30%  (1) genetics testing 07/05/2017 through the Common Hereditary Cancer Panel offered by [facility] found no deleterious mutations in APC, ATM, AXIN2, BARD1, BMPR1A, BRCA1, BRCA2, BRIP1, CDH1, CDKN2A (p14ARF), CDKN2A (p16INK4a), CKD4, CHEK2, CTNNA1, DICER1, EPCAM (  Deletion/duplication testing only), GREM1 (promoter region deletion/duplication testing only), KIT, MEN1, MLH1, MSH2, MSH3, MSH6, MUTYH, NBN, NF1, NHTL1, PALB2, PDGFRA, PMS2, POLD1, POLE, PTEN, RAD50, RAD51C, RAD51D, SDHB, SDHC, SDHD, SMAD4,  SMARCA4. STK11, TP53, TSC1, TSC2, and VHL.  The following genes were evaluated for sequence changes only: SDHA and HOXB13 c.251G>A variant only.  (a) A Variant of uncertain significance in MSH2 was identified c.1331G>T (p.Arg444Leu).   (2) status post right lumpectomy and sentinel lymph node sampling 07/17/2017 for a pT1c pN0, stage IA invasive ductal carcinoma, grade 2, with negative margins.  A total of 5 lymph nodes were removed  (3) adjuvant chemotherapy will consist of paclitaxel weekly x12 together with trastuzumab every 21 days starting 08/07/2017  (4) trastuzumab will be continued to total 1 year  (a) echo pending  5) adjuvant radiation to follow  PLAN: Coriana is recovering well from her surgery.  We spent a great deal of time today discussing adjuvant treatment options for her.  Given her overall good prognosis I think using paclitaxel weekly x12 together with trastuzumab every 3 weeks to be continued for a year would be optimal.  We discussed the possible toxicities, side effects and complications of each of these agents.  She also will come to chemotherapy education early next week for further details.  She already has a port in place.  She will need an echocardiogram before her return visit with me.  At that point we will discuss how to take her supportive medications.  The plan is to start the paclitaxel and trastuzumab on 02 19 2019  She knows to call for any other issues that may develop before her next visit here.   Magrinat, Virgie Dad, MD  07/27/17 10:38 AM Medical Oncology and Hematology [facility] [address] [place], [place] [address] Tel. [phone]    Fax. [phone]    This document serves as a record of services personally performed by Lurline Del, MD. It was created on his behalf by Steva Colder, a trained medical scribe. The creation of this record is based on the scribe's personal observations and the provider's statements to them.     I have reviewed the above documentation for accuracy and completeness, and I agree with the above.

## 2017-07-27 ENCOUNTER — Telehealth: Payer: Self-pay | Admitting: Oncology

## 2017-07-27 ENCOUNTER — Inpatient Hospital Stay: Payer: Medicare Other | Attending: Oncology | Admitting: Oncology

## 2017-07-27 VITALS — BP 196/75 | HR 70 | Temp 98.6°F | Resp 18 | Ht 63.5 in | Wt 152.7 lb

## 2017-07-27 DIAGNOSIS — Z171 Estrogen receptor negative status [ER-]: Secondary | ICD-10-CM | POA: Diagnosis not present

## 2017-07-27 DIAGNOSIS — C50411 Malignant neoplasm of upper-outer quadrant of right female breast: Secondary | ICD-10-CM | POA: Diagnosis present

## 2017-07-27 DIAGNOSIS — Z5112 Encounter for antineoplastic immunotherapy: Secondary | ICD-10-CM | POA: Diagnosis not present

## 2017-07-27 DIAGNOSIS — Z5111 Encounter for antineoplastic chemotherapy: Secondary | ICD-10-CM | POA: Diagnosis present

## 2017-07-27 NOTE — Progress Notes (Signed)
START OFF PATHWAY REGIMEN - Breast   [id number]:Paclitaxel + Trastuzumab:   A cycle is every 28 days:     Paclitaxel      Trastuzumab      Trastuzumab   **Always confirm dose/schedule in your pharmacy ordering system**    Patient Characteristics: Postoperative without Neoadjuvant Therapy (Pathologic Staging), Invasive Disease, Extended Adjuvant Therapy Indicated (Following Completion of Trastuzumab-based Adjuvant Therapy), HER2 Positive, ER Negative/Unknown Therapeutic Status: Postoperative without Neoadjuvant Therapy (Pathologic Staging) AJCC Grade: G3 AJCC N Category: pN0 AJCC M Category: cM0 ER Status: Negative (-) AJCC 8 Stage Grouping: IA HER2 Status: Positive (+) Oncotype Dx Recurrence Score: Not Appropriate AJCC T Category: pT1c PR Status: Negative (-) Intent of Therapy: Curative Intent, Discussed with Patient

## 2017-07-27 NOTE — Telephone Encounter (Signed)
Gave patient avs and calendar with appts per 2/8 los °

## 2017-07-31 ENCOUNTER — Other Ambulatory Visit: Payer: Self-pay | Admitting: Oncology

## 2017-07-31 ENCOUNTER — Telehealth: Payer: Self-pay | Admitting: Adult Health

## 2017-07-31 ENCOUNTER — Inpatient Hospital Stay: Payer: Medicare Other

## 2017-07-31 NOTE — Telephone Encounter (Signed)
Called patient regarding 2/19

## 2017-08-02 ENCOUNTER — Other Ambulatory Visit: Payer: Self-pay

## 2017-08-02 ENCOUNTER — Ambulatory Visit (HOSPITAL_COMMUNITY): Payer: Medicare Other | Attending: Pediatric Gastroenterology

## 2017-08-02 DIAGNOSIS — Z170 Estrogen receptor positive status [ER+]: Secondary | ICD-10-CM | POA: Diagnosis not present

## 2017-08-02 DIAGNOSIS — I081 Rheumatic disorders of both mitral and tricuspid valves: Secondary | ICD-10-CM | POA: Diagnosis not present

## 2017-08-02 DIAGNOSIS — Z8249 Family history of ischemic heart disease and other diseases of the circulatory system: Secondary | ICD-10-CM | POA: Diagnosis not present

## 2017-08-02 DIAGNOSIS — C50411 Malignant neoplasm of upper-outer quadrant of right female breast: Secondary | ICD-10-CM | POA: Insufficient documentation

## 2017-08-02 DIAGNOSIS — Z171 Estrogen receptor negative status [ER-]: Secondary | ICD-10-CM | POA: Diagnosis not present

## 2017-08-02 DIAGNOSIS — I10 Essential (primary) hypertension: Secondary | ICD-10-CM | POA: Diagnosis not present

## 2017-08-03 ENCOUNTER — Encounter: Payer: Self-pay | Admitting: *Deleted

## 2017-08-03 ENCOUNTER — Other Ambulatory Visit: Payer: Self-pay | Admitting: *Deleted

## 2017-08-03 DIAGNOSIS — C50411 Malignant neoplasm of upper-outer quadrant of right female breast: Secondary | ICD-10-CM

## 2017-08-03 DIAGNOSIS — Z171 Estrogen receptor negative status [ER-]: Principal | ICD-10-CM

## 2017-08-03 MED ORDER — LORAZEPAM 0.5 MG PO TABS: 0.5000 mg | ORAL_TABLET | Freq: Four times a day (QID) | ORAL | 0 refills | Status: DC | PRN

## 2017-08-03 MED ORDER — ONDANSETRON HCL 8 MG PO TABS: 8.0000 mg | ORAL_TABLET | Freq: Two times a day (BID) | ORAL | 1 refills | Status: DC | PRN

## 2017-08-03 MED ORDER — PROCHLORPERAZINE MALEATE 10 MG PO TABS: 10.0000 mg | ORAL_TABLET | Freq: Four times a day (QID) | ORAL | 1 refills | Status: DC | PRN

## 2017-08-03 MED ORDER — LIDOCAINE-PRILOCAINE 2.5-2.5 % EX CREA: TOPICAL_CREAM | CUTANEOUS | 3 refills | Status: DC

## 2017-08-07 ENCOUNTER — Encounter: Payer: Self-pay | Admitting: *Deleted

## 2017-08-07 ENCOUNTER — Ambulatory Visit: Payer: Medicare Other

## 2017-08-07 ENCOUNTER — Inpatient Hospital Stay: Payer: Medicare Other

## 2017-08-07 ENCOUNTER — Other Ambulatory Visit: Payer: Medicare Other

## 2017-08-07 ENCOUNTER — Inpatient Hospital Stay (HOSPITAL_BASED_OUTPATIENT_CLINIC_OR_DEPARTMENT_OTHER): Payer: Medicare Other | Admitting: Adult Health

## 2017-08-07 ENCOUNTER — Encounter: Payer: Self-pay | Admitting: Adult Health

## 2017-08-07 VITALS — BP 179/74 | HR 62 | Temp 98.2°F | Resp 20 | Ht 63.5 in | Wt 153.8 lb

## 2017-08-07 VITALS — BP 176/84 | HR 82 | Temp 98.2°F | Resp 18

## 2017-08-07 DIAGNOSIS — Z171 Estrogen receptor negative status [ER-]: Principal | ICD-10-CM

## 2017-08-07 DIAGNOSIS — C50411 Malignant neoplasm of upper-outer quadrant of right female breast: Secondary | ICD-10-CM

## 2017-08-07 DIAGNOSIS — Z5112 Encounter for antineoplastic immunotherapy: Secondary | ICD-10-CM | POA: Diagnosis not present

## 2017-08-07 LAB — COMPREHENSIVE METABOLIC PANEL
ALT: 13 U/L (ref 0–55)
AST: 18 U/L (ref 5–34)
Albumin: 3.9 g/dL (ref 3.5–5.0)
Alkaline Phosphatase: 80 U/L (ref 40–150)
Anion gap: 10 (ref 3–11)
BUN: 11 mg/dL (ref 7–26)
CO2: 23 mmol/L (ref 22–29)
Calcium: 9.6 mg/dL (ref 8.4–10.4)
Chloride: 107 mmol/L (ref 98–109)
Creatinine, Ser: 0.89 mg/dL (ref 0.60–1.10)
GFR calc Af Amer: >60 mL/min (ref >60)
GFR calc non Af Amer: >60 mL/min (ref >60)
Glucose, Bld: 108 mg/dL (ref 70–140)
Potassium: 3.7 mmol/L (ref 3.5–5.1)
Sodium: 140 mmol/L (ref 136–145)
Total Bilirubin: 0.3 mg/dL (ref 0.2–1.2)
Total Protein: 7.3 g/dL (ref 6.4–8.3)

## 2017-08-07 LAB — CBC WITH DIFFERENTIAL/PLATELET
Basophils Absolute: 0 10*3/uL (ref 0.0–0.1)
Basophils Relative: 1 %
Eosinophils Absolute: 0.1 10*3/uL (ref 0.0–0.5)
Eosinophils Relative: 4 %
HCT: 33.1 % — ABNORMAL LOW (ref 34.8–46.6)
Hemoglobin: 10.8 g/dL — ABNORMAL LOW (ref 11.6–15.9)
Lymphocytes Relative: 43 %
Lymphs Abs: 1.6 10*3/uL (ref 0.9–3.3)
MCH: 28.8 pg (ref 25.1–34.0)
MCHC: 32.7 g/dL (ref 31.5–36.0)
MCV: 88.2 fL (ref 79.5–101.0)
Monocytes Absolute: 0.4 10*3/uL (ref 0.1–0.9)
Monocytes Relative: 10 %
Neutro Abs: 1.5 10*3/uL (ref 1.5–6.5)
Neutrophils Relative %: 42 %
Platelets: 260 10*3/uL (ref 145–400)
RBC: 3.75 MIL/uL (ref 3.70–5.45)
RDW: 13.1 % (ref 11.2–14.5)
WBC: 3.7 10*3/uL — ABNORMAL LOW (ref 3.9–10.3)

## 2017-08-07 MED ORDER — CLONIDINE HCL 0.1 MG PO TABS
0.1000 mg | ORAL_TABLET | Freq: Once | ORAL | Status: AC
  Administered 2017-08-07: 0.1 mg via ORAL

## 2017-08-07 MED ORDER — ACETAMINOPHEN 325 MG PO TABS
ORAL_TABLET | ORAL | Status: AC
  Filled 2017-08-07: qty 2

## 2017-08-07 MED ORDER — SODIUM CHLORIDE 0.9 % IV SOLN
20.0000 mg | Freq: Once | INTRAVENOUS | Status: AC
  Administered 2017-08-07: 20 mg via INTRAVENOUS
  Filled 2017-08-07: qty 2

## 2017-08-07 MED ORDER — ACETAMINOPHEN 325 MG PO TABS
650.0000 mg | ORAL_TABLET | Freq: Once | ORAL | Status: AC
  Administered 2017-08-07: 650 mg via ORAL

## 2017-08-07 MED ORDER — FAMOTIDINE IN NACL 20-0.9 MG/50ML-% IV SOLN: 20.0000 mg | Freq: Once | INTRAVENOUS | Status: DC

## 2017-08-07 MED ORDER — SODIUM CHLORIDE 0.9% FLUSH
10.0000 mL | INTRAVENOUS | Status: DC | PRN
  Administered 2017-08-07: 10 mL
  Filled 2017-08-07: qty 10

## 2017-08-07 MED ORDER — COLD PACK MISC ONCOLOGY
1.0000 | Freq: Once | Status: AC | PRN
  Administered 2017-08-07: 1 via TOPICAL
  Filled 2017-08-07: qty 1

## 2017-08-07 MED ORDER — SODIUM CHLORIDE 0.9 % IV SOLN
80.0000 mg/m2 | Freq: Once | INTRAVENOUS | Status: AC
  Administered 2017-08-07: 68 mg via INTRAVENOUS
  Filled 2017-08-07: qty 23

## 2017-08-07 MED ORDER — CLONIDINE HCL 0.1 MG PO TABS
ORAL_TABLET | ORAL | Status: AC
Start: 2017-08-07 — End: 2017-08-07
  Filled 2017-08-07: qty 1

## 2017-08-07 MED ORDER — HEPARIN SOD (PORK) LOCK FLUSH 100 UNIT/ML IV SOLN
500.0000 [IU] | Freq: Once | INTRAVENOUS | Status: AC | PRN
  Administered 2017-08-07: 500 [IU]
  Filled 2017-08-07: qty 5

## 2017-08-07 MED ORDER — DIPHENHYDRAMINE HCL 25 MG PO TABS
25.0000 mg | ORAL_TABLET | Freq: Once | ORAL | Status: AC
  Administered 2017-08-07: 25 mg via ORAL
  Filled 2017-08-07: qty 1

## 2017-08-07 MED ORDER — DIPHENHYDRAMINE HCL 25 MG PO CAPS
ORAL_CAPSULE | ORAL | Status: AC
Start: 2017-08-07 — End: 2017-08-07
  Filled 2017-08-07: qty 1

## 2017-08-07 MED ORDER — TRASTUZUMAB CHEMO 150 MG IV SOLR
8.0000 mg/kg | Freq: Once | INTRAVENOUS | Status: AC
  Administered 2017-08-07: 546 mg via INTRAVENOUS
  Filled 2017-08-07: qty 26

## 2017-08-07 MED ORDER — SODIUM CHLORIDE 0.9 % IV SOLN
Freq: Once | INTRAVENOUS | Status: AC
  Administered 2017-08-07: 12:00:00 via INTRAVENOUS

## 2017-08-07 NOTE — Progress Notes (Signed)
1728- Pt at max rate of Taxol infusion (273 mL/hr) and pt BP 179/85, asymptomatic.  Sandi Mealy, PA notified and orders received to administer 0.1 mg p.o. Clonidine.    1746- BP 176/91 and asymptomatic  1757 - BP 176/84 and asymptomatic, pt states she feels okay and wants to be discharged home.

## 2017-08-07 NOTE — Patient Instructions (Addendum)
[facility] Discharge Instructions for Patients Receiving Chemotherapy  Today you received the following chemotherapy agents Taxol and Herceptin  To help prevent nausea and vomiting after your treatment, we encourage you to take your nausea medication as directed   If you develop nausea and vomiting that is not controlled by your nausea medication, call the clinic.   BELOW ARE SYMPTOMS THAT SHOULD BE REPORTED IMMEDIATELY:  *FEVER GREATER THAN 100.5 F  *CHILLS WITH OR WITHOUT FEVER  NAUSEA AND VOMITING THAT IS NOT CONTROLLED WITH YOUR NAUSEA MEDICATION  *UNUSUAL SHORTNESS OF BREATH  *UNUSUAL BRUISING OR BLEEDING  TENDERNESS IN MOUTH AND THROAT WITH OR WITHOUT PRESENCE OF ULCERS  *URINARY PROBLEMS  *BOWEL PROBLEMS  UNUSUAL RASH Items with * indicate a potential emergency and should be followed up as soon as possible.  Feel free to call the clinic should you have any questions or concerns. The clinic phone number is [phone].  Please show the [facility] at check-in to the Emergency Department and triage nurse.    Paclitaxel injection What is this medicine? PACLITAXEL (PAK li TAX el) is a chemotherapy drug. It targets fast dividing cells, like cancer cells, and causes these cells to die. This medicine is used to treat ovarian cancer, breast cancer, and other cancers. This medicine may be used for other purposes; ask your health care provider or pharmacist if you have questions. COMMON BRAND NAME(S): Onxol, Taxol What should I tell my health care provider before I take this medicine? They need to know if you have any of these conditions: -blood disorders -irregular heartbeat -infection (especially a virus infection such as chickenpox, cold sores, or herpes) -liver disease -previous or ongoing radiation therapy -an unusual or allergic reaction to paclitaxel, alcohol, polyoxyethylated castor oil, other chemotherapy agents, other medicines, foods,  dyes, or preservatives -pregnant or trying to get pregnant -breast-feeding How should I use this medicine? This drug is given as an infusion into a vein. It is administered in a hospital or clinic by a specially trained health care professional. Talk to your pediatrician regarding the use of this medicine in children. Special care may be needed. Overdosage: If you think you have taken too much of this medicine contact a poison control center or emergency room at once. NOTE: This medicine is only for you. Do not share this medicine with others. What if I miss a dose? It is important not to miss your dose. Call your doctor or health care professional if you are unable to keep an appointment. What may interact with this medicine? Do not take this medicine with any of the following medications: -disulfiram -metronidazole This medicine may also interact with the following medications: -cyclosporine -diazepam -ketoconazole -medicines to increase blood counts like filgrastim, pegfilgrastim, sargramostim -other chemotherapy drugs like cisplatin, doxorubicin, epirubicin, etoposide, teniposide, vincristine -quinidine -testosterone -vaccines -verapamil Talk to your doctor or health care professional before taking any of these medicines: -acetaminophen -aspirin -ibuprofen -ketoprofen -naproxen This list may not describe all possible interactions. Give your health care provider a list of all the medicines, herbs, non-prescription drugs, or dietary supplements you use. Also tell them if you smoke, drink alcohol, or use illegal drugs. Some items may interact with your medicine. What should I watch for while using this medicine? Your condition will be monitored carefully while you are receiving this medicine. You will need important blood work done while you are taking this medicine. This medicine can cause serious allergic reactions. To reduce your risk you  will need to take other medicine(s)  before treatment with this medicine. If you experience allergic reactions like skin rash, itching or hives, swelling of the face, lips, or tongue, tell your doctor or health care professional right away. In some cases, you may be given additional medicines to help with side effects. Follow all directions for their use. This drug may make you feel generally unwell. This is not uncommon, as chemotherapy can affect healthy cells as well as cancer cells. Report any side effects. Continue your course of treatment even though you feel ill unless your doctor tells you to stop. Call your doctor or health care professional for advice if you get a fever, chills or sore throat, or other symptoms of a cold or flu. Do not treat yourself. This drug decreases your body's ability to fight infections. Try to avoid being around people who are sick. This medicine may increase your risk to bruise or bleed. Call your doctor or health care professional if you notice any unusual bleeding. Be careful brushing and flossing your teeth or using a toothpick because you may get an infection or bleed more easily. If you have any dental work done, tell your dentist you are receiving this medicine. Avoid taking products that contain aspirin, acetaminophen, ibuprofen, naproxen, or ketoprofen unless instructed by your doctor. These medicines may hide a fever. Do not become pregnant while taking this medicine. Women should inform their doctor if they wish to become pregnant or think they might be pregnant. There is a potential for serious side effects to an unborn child. Talk to your health care professional or pharmacist for more information. Do not breast-feed an infant while taking this medicine. Men are advised not to father a child while receiving this medicine. This product may contain alcohol. Ask your pharmacist or healthcare provider if this medicine contains alcohol. Be sure to tell all healthcare providers you are taking this  medicine. Certain medicines, like metronidazole and disulfiram, can cause an unpleasant reaction when taken with alcohol. The reaction includes flushing, headache, nausea, vomiting, sweating, and increased thirst. The reaction can last from 30 minutes to several hours. What side effects may I notice from receiving this medicine? Side effects that you should report to your doctor or health care professional as soon as possible: -allergic reactions like skin rash, itching or hives, swelling of the face, lips, or tongue -low blood counts - This drug may decrease the number of white blood cells, red blood cells and platelets. You may be at increased risk for infections and bleeding. -signs of infection - fever or chills, cough, sore throat, pain or difficulty passing urine -signs of decreased platelets or bleeding - bruising, pinpoint red spots on the skin, black, tarry stools, nosebleeds -signs of decreased red blood cells - unusually weak or tired, fainting spells, lightheadedness -breathing problems -chest pain -high or low blood pressure -mouth sores -nausea and vomiting -pain, swelling, redness or irritation at the injection site -pain, tingling, numbness in the hands or feet -slow or irregular heartbeat -swelling of the ankle, feet, hands Side effects that usually do not require medical attention (report to your doctor or health care professional if they continue or are bothersome): -bone pain -complete hair loss including hair on your head, underarms, pubic hair, eyebrows, and eyelashes -changes in the color of fingernails -diarrhea -loosening of the fingernails -loss of appetite -muscle or joint pain -red flush to skin -sweating This list may not describe all possible side effects. Call your doctor for   medical advice about side effects. You may report side effects to FDA at 1-800-FDA-1088. Where should I keep my medicine? This drug is given in a hospital or clinic and will not be  stored at home. NOTE: This sheet is a summary. It may not cover all possible information. If you have questions about this medicine, talk to your doctor, pharmacist, or health care provider.  2018 Elsevier/[employer] Standard (2015-04-06 19:58:00)   Trastuzumab injection for infusion What is this medicine? TRASTUZUMAB (tras TOO zoo mab) is a monoclonal antibody. It is used to treat breast cancer and stomach cancer. This medicine may be used for other purposes; ask your health care provider or pharmacist if you have questions. COMMON BRAND NAME(S): Herceptin What should I tell my health care provider before I take this medicine? They need to know if you have any of these conditions: -heart disease -heart failure -lung or breathing disease, like asthma -an unusual or allergic reaction to trastuzumab, benzyl alcohol, or other medications, foods, dyes, or preservatives -pregnant or trying to get pregnant -breast-feeding How should I use this medicine? This drug is given as an infusion into a vein. It is administered in a hospital or clinic by a specially trained health care professional. Talk to your pediatrician regarding the use of this medicine in children. This medicine is not approved for use in children. Overdosage: If you think you have taken too much of this medicine contact a poison control center or emergency room at once. NOTE: This medicine is only for you. Do not share this medicine with others. What if I miss a dose? It is important not to miss a dose. Call your doctor or health care professional if you are unable to keep an appointment. What may interact with this medicine? This medicine may interact with the following medications: -certain types of chemotherapy, such as daunorubicin, doxorubicin, epirubicin, and idarubicin This list may not describe all possible interactions. Give your health care provider a list of all the medicines, herbs, non-prescription drugs, or dietary  supplements you use. Also tell them if you smoke, drink alcohol, or use illegal drugs. Some items may interact with your medicine. What should I watch for while using this medicine? Visit your doctor for checks on your progress. Report any side effects. Continue your course of treatment even though you feel ill unless your doctor tells you to stop. Call your doctor or health care professional for advice if you get a fever, chills or sore throat, or other symptoms of a cold or flu. Do not treat yourself. Try to avoid being around people who are sick. You may experience fever, chills and shaking during your first infusion. These effects are usually mild and can be treated with other medicines. Report any side effects during the infusion to your health care professional. Fever and chills usually do not happen with later infusions. Do not become pregnant while taking this medicine or for 7 months after stopping it. Women should inform their doctor if they wish to become pregnant or think they might be pregnant. Women of child-bearing potential will need to have a negative pregnancy test before starting this medicine. There is a potential for serious side effects to an unborn child. Talk to your health care professional or pharmacist for more information. Do not breast-feed an infant while taking this medicine or for 7 months after stopping it. Women must use effective birth control with this medicine. What side effects may I notice from receiving this medicine? Side effects  that you should report to your doctor or health care professional as soon as possible: -allergic reactions like skin rash, itching or hives, swelling of the face, lips, or tongue -chest pain or palpitations -cough -dizziness -feeling faint or lightheaded, falls -fever -general ill feeling or flu-like symptoms -signs of worsening heart failure like breathing problems; swelling in your legs and feet -unusually weak or tired Side  effects that usually do not require medical attention (report to your doctor or health care professional if they continue or are bothersome): -bone pain -changes in taste -diarrhea -joint pain -nausea/vomiting -weight loss This list may not describe all possible side effects. Call your doctor for medical advice about side effects. You may report side effects to FDA at 1-800-FDA-1088. Where should I keep my medicine? This drug is given in a hospital or clinic and will not be stored at home. NOTE: This sheet is a summary. It may not cover all possible information. If you have questions about this medicine, talk to your doctor, pharmacist, or health care provider.  2018 Elsevier/Gold Standard (2016-05-30 14:37:52).

## 2017-08-07 NOTE — Patient Instructions (Signed)
Implanted Port Home Guide An implanted port is a type of central line that is placed under the skin. Central lines are used to provide IV access when treatment or nutrition needs to be given through a person's veins. Implanted ports are used for long-term IV access. An implanted port may be placed because:  You need IV medicine that would be irritating to the small veins in your hands or arms.  You need long-term IV medicines, such as antibiotics.  You need IV nutrition for a long period.  You need frequent blood draws for lab tests.  You need dialysis.  Implanted ports are usually placed in the chest area, but they can also be placed in the upper arm, the abdomen, or the leg. An implanted port has two main parts:  Reservoir. The reservoir is round and will appear as a small, raised area under your skin. The reservoir is the part where a needle is inserted to give medicines or draw blood.  Catheter. The catheter is a thin, flexible tube that extends from the reservoir. The catheter is placed into a large vein. Medicine that is inserted into the reservoir goes into the catheter and then into the vein.  How will I care for my incision site? Do not get the incision site wet. Bathe or shower as directed by your health care provider. How is my port accessed? Special steps must be taken to access the port:  Before the port is accessed, a numbing cream can be placed on the skin. This helps numb the skin over the port site.  Your health care provider uses a sterile technique to access the port. ? Your health care provider must put on a mask and sterile gloves. ? The skin over your port is cleaned carefully with an antiseptic and allowed to dry. ? The port is gently pinched between sterile gloves, and a needle is inserted into the port.  Only "non-coring" port needles should be used to access the port. Once the port is accessed, a blood return should be checked. This helps ensure that the port  is in the vein and is not clogged.  If your port needs to remain accessed for a constant infusion, a clear (transparent) bandage will be placed over the needle site. The bandage and needle will need to be changed every week, or as directed by your health care provider.  Keep the bandage covering the needle clean and dry. Do not get it wet. Follow your health care provider's instructions on how to take a shower or bath while the port is accessed.  If your port does not need to stay accessed, no bandage is needed over the port.  What is flushing? Flushing helps keep the port from getting clogged. Follow your health care provider's instructions on how and when to flush the port. Ports are usually flushed with saline solution or a medicine called heparin. The need for flushing will depend on how the port is used.  If the port is used for intermittent medicines or blood draws, the port will need to be flushed: ? After medicines have been given. ? After blood has been drawn. ? As part of routine maintenance.  If a constant infusion is running, the port may not need to be flushed.  How long will my port stay implanted? The port can stay in for as long as your health care provider thinks it is needed. When it is time for the port to come out, surgery will be   done to remove it. The procedure is similar to the one performed when the port was put in. When should I seek immediate medical care? When you have an implanted port, you should seek immediate medical care if:  You notice a bad smell coming from the incision site.  You have swelling, redness, or drainage at the incision site.  You have more swelling or pain at the port site or the surrounding area.  You have a fever that is not controlled with medicine.  This information is not intended to replace advice given to you by your health care provider. Make sure you discuss any questions you have with your health care provider. Document  Released: 06/05/2005 Document Revised: 11/11/2015 Document Reviewed: 02/10/2013 [employer] Interactive Patient Education  2017 [employer]  

## 2017-08-07 NOTE — Progress Notes (Signed)
[place]  [phone] [phone]     ID: April [name] DOB: 01/01/1951  MR#: [id number]  UQJ#:[id number]  Patient Care Team: Kelton Pillar, MD as PCP - General (Family Medicine) Magrinat, Virgie Dad, MD as Consulting Physician (Oncology) Rolm Bookbinder, MD as Consulting Physician (General Surgery) Eppie Gibson, MD as Attending Physician (Radiation Oncology) Newt Minion, MD as Consulting Physician (Orthopedic Surgery) Regal, Tamala Fothergill, DPM as Consulting Physician (Podiatry) Neldon Mc, Donnamarie Poag, MD as Consulting Physician (Allergy and Immunology) Scot Dock, NP OTHER MD:  CHIEF COMPLAINT: Estrogen receptor negative breast cancer  CURRENT TREATMENT: Adjuvant chemotherapy/immunotherapy   HISTORY OF CURRENT ILLNESS: From the original intake note:  "April [name]" had bilateral screening mammography at [facility] 06/06/2017.  This showed a possible mass in the right breast at the 11 o'clock position.  On 06/13/2017 she underwent right diagnostic mammography and ultrasonography.  Breast density was category C.  In the right breast at the 11 o'clock position there was a 1 cm area by mammography.  By ultrasound this confirmed a 1.0 cm irregular mass with lobulated margins in the upper outer quadrant of the right breast.  There was a second, 0.4 cm lobulated mass in the same quadrant.  The right axilla was sonographically benign.  On 06/20/2017 biopsy of the 2 right breast masses in question was performed.  The final pathology (SAA [id number]) found the smaller mass to be only fibrocystic change.  This is felt to be concordant.  The larger mass however was an invasive ductal carcinoma, grade 3, estrogen and progesterone receptor negative, with an MIB-1 of 30%, and HER-2 amplified, with a signals ratio of 2.24.  The number per cell was 4.60.  The patient's subsequent history is as detailed below.  INTERVAL HISTORY: April [name] returns to the office today for  follow up of her estrogen receptor negative breast cancer, Her-2 positive breast cancer.  She is here to start treatment with Paclitaxel/Trastuzumab.  She has picked up her scripts for anti nausea medication.  She has undergone her Echocardiogram and it showed an ejection fraction of 65-70%.  She is ready to begin chemo.   REVIEW OF SYSTEMS: April [name] continues to do well today.  She denies any issues.  Her lumpectomy site continues to heal.  She has undergone port placement.  She denies shortness of breath, chest pain, palpitations, headaches, baseline peripheral neuropathy, or any other concerns today.     PAST MEDICAL HISTORY: Past Medical History:  Diagnosis Date  . Breast cancer ([place]) 05/2017   right breast  . Eczema   . Family history of breast cancer   . Family history of ovarian cancer   . Hypertension    toxemia during pregnancy, no meds now    PAST SURGICAL HISTORY: Past Surgical History:  Procedure Laterality Date  . ABDOMINAL HYSTERECTOMY    . BREAST LUMPECTOMY WITH RADIOACTIVE SEED AND SENTINEL LYMPH NODE BIOPSY Right 07/17/2017   Procedure: BREAST LUMPECTOMY WITH RADIOACTIVE SEED AND SENTINEL LYMPH NODE BIOPSY;  Surgeon: Rolm Bookbinder, MD;  Location: [facility];  Service: General;  Laterality: Right;  . CESAREAN SECTION     x4  . DILATION AND CURETTAGE OF UTERUS    . KNEE SURGERY Right   . PORTACATH PLACEMENT N/A 07/17/2017   Procedure: INSERTION PORT-A-CATH WITH [place];  Surgeon: Rolm Bookbinder, MD;  Location: [facility];  Service: General;  Laterality: N/A;    FAMILY HISTORY Family History  Problem Relation Age of Onset  .  Breast cancer Mother 18       again at 63 in other breast   . Heart attack Father 45  . Breast cancer Sister 70  . Breast cancer Maternal Grandmother 59       spread to lungs, died at 43  . Ovarian cancer Cousin 78  The patient's father died at age 64 from a heart attack.  The patient's mother is currently  living at age 74 (as of January 2019).  The patient had 1 sister who was diagnosed with breast cancer at age 44 and died from metastatic disease at age 21.  The patient has 1 brother.  In addition the patient's mother was diagnosed with breast cancer at age 4, on the left side, and now has a right-sided breast cancer diagnosed in January 2019.  There is in addition a cousin with ovarian cancer diagnosed when she was 68 years old  GYNECOLOGIC HISTORY:  No LMP recorded. Patient has had a hysterectomy. Menarche age 65, first live birth age 59, the patient had 3 live births, 1 of whom survived only 2 days.  She underwent hysterectomy without salpingo-oophorectomy September 05, 1978.  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  She is now retired.  Her husband April [name] his Theme park manager at [place].  The patient's daughter April [name] lives in [place] where she is an Tourist information centre manager.  The patient's daughter April [name] lives in [place] working for the department of defense.  The patient has 3 grandchildren.     ADVANCED DIRECTIVES: Not in place   HEALTH MAINTENANCE: Social History   Tobacco Use  . Smoking status: Never Smoker  . Smokeless tobacco: Never Used  Substance Use Topics  . Alcohol use: Yes    Comment: social  . Drug use: No     Colonoscopy: April 2018/[facility]  PAP: Status post hysterectomy  Bone density:   Allergies  Allergen Reactions  . Tape Itching and Rash  . Nsaids   . Sulfa Antibiotics Rash    Current Outpatient Medications  Medication Sig Dispense Refill  . lidocaine-prilocaine (EMLA) cream Apply to affected area once 30 g 3  . ondansetron (ZOFRAN) 8 MG tablet Take 1 tablet (8 mg total) by mouth 2 (two) times daily as needed (Nausea or vomiting). 30 tablet 1  . prochlorperazine (COMPAZINE) 10 MG tablet Take 1 tablet (10 mg total)  by mouth every 6 (six) hours as needed (Nausea or vomiting). 30 tablet 1  . CALCIUM-VITAMIN D PO Take 1 tablet by mouth daily.    Marland Kitchen GENERIC EXTERNAL MEDICATION Take 1 tablet by mouth daily.    Marland Kitchen GENERIC EXTERNAL MEDICATION Take 16 oz by mouth daily.    Marland Kitchen GENERIC EXTERNAL MEDICATION Take 8 oz by mouth daily.    Marland Kitchen LORazepam (ATIVAN) 0.5 MG tablet Take 1 tablet (0.5 mg total) by mouth every 6 (six) hours as needed (Nausea or vomiting). (Patient not taking: Reported on 08/07/2017) 30 tablet 0  . Multiple Vitamin (MULTIVITAMIN WITH MINERALS) TABS tablet Take 1 tablet by mouth daily.    Marland Kitchen oxyCODONE (OXY IR/ROXICODONE) 5 MG immediate release tablet Take 1 tablet (5 mg total) by mouth every 6 (six) hours as needed for moderate pain, severe pain or breakthrough pain. (Patient not taking: Reported on 08/07/2017) 12 tablet 0  . vitamin B-12 (CYANOCOBALAMIN) 1000 MCG tablet Take 1,000 mcg by  mouth daily.     No current facility-administered medications for this visit.    Facility-Administered Medications Ordered in Other Visits  Medication Dose Route Frequency Provider Last Rate Last Dose  . Cold Pack 1 packet  1 packet Topical Once PRN Magrinat, Virgie Dad, MD      . dexamethasone (DECADRON) 20 mg in sodium chloride 0.9 % 50 mL IVPB  20 mg Intravenous Once Magrinat, Virgie Dad, MD      . famotidine (PEPCID) 20 mg in sodium chloride 0.9 % 100 mL IVPB  20 mg Intravenous Once Magrinat, Virgie Dad, MD      . heparin lock flush 100 unit/mL  500 Units Intracatheter Once PRN Magrinat, Virgie Dad, MD      . PACLitaxel (TAXOL) 138 mg in sodium chloride 0.9 % 250 mL chemo infusion (</= 31m/m2)  80 mg/m2 (Treatment Plan Recorded) Intravenous Once Magrinat, GVirgie Dad MD      . sodium chloride flush (NS) 0.9 % injection 10 mL  10 mL Intracatheter PRN Magrinat, GVirgie Dad MD      . trastuzumab (HERCEPTIN) 546 mg in sodium chloride 0.9 % 250 mL chemo infusion  8 mg/kg (Treatment Plan Recorded) Intravenous Once Magrinat, GVirgie Dad  MD        OBJECTIVE:   Vitals:   08/07/17 1013  BP: (!) 179/74  Pulse: 62  Resp: 20  Temp: 98.2 F (36.8 C)  SpO2: 100%     Body mass index is 26.82 kg/m.   Wt Readings from Last 3 Encounters:  08/07/17 153 lb 12.8 oz (69.8 kg)  07/27/17 152 lb 11.2 oz (69.3 kg)  07/17/17 152 lb (68.9 kg)      ECOG FS:0 - Asymptomatic GENERAL: Patient is a well appearing female in no acute distress HEENT:  Sclerae anicteric.  Oropharynx clear and moist. No ulcerations or evidence of oropharyngeal candidiasis. Neck is supple.  NODES:  No cervical, supraclavicular, or axillary lymphadenopathy palpated.  BREAST EXAM:  Right lumpectomy site healing well, no sign of infection noted LUNGS:  Clear to auscultation bilaterally.  No wheezes or rhonchi. HEART:  Regular rate and rhythm. No murmur appreciated. ABDOMEN:  Soft, nontender.  Positive, normoactive bowel sounds. No organomegaly palpated. MSK:  No focal spinal tenderness to palpation. Full range of motion bilaterally in the upper extremities. EXTREMITIES:  No peripheral edema.   SKIN:  Clear with no obvious rashes or skin changes. No nail dyscrasia. NEURO:  Nonfocal. Well oriented.  Appropriate affect.     LAB RESULTS:  CMP     Component Value Date/Time   NA 140 08/07/2017 0932   K 3.7 08/07/2017 0932   CL 107 08/07/2017 0932   CO2 23 08/07/2017 0932   GLUCOSE 108 08/07/2017 0932   BUN 11 08/07/2017 0932   CREATININE 0.89 08/07/2017 0932   CREATININE 1.01 06/27/2017 0843   CALCIUM 9.6 08/07/2017 0932   PROT 7.3 08/07/2017 0932   ALBUMIN 3.9 08/07/2017 0932   AST 18 08/07/2017 0932   AST 16 06/27/2017 0843   ALT 13 08/07/2017 0932   ALT 14 06/27/2017 0843   ALKPHOS 80 08/07/2017 0932   BILITOT 0.3 08/07/2017 0932   BILITOT 0.2 06/27/2017 0843   GFRNONAA >60 08/07/2017 0932   GFRNONAA 56 (L) 06/27/2017 0843   GFRAA >60 08/07/2017 0932   GFRAA >60 06/27/2017 0843    No results found for: TOTALPROTELP, ALBUMINELP, A1GS,  A2GS, BETS, BETA2SER, GAMS, MSPIKE, SPEI  No results found for: KPAFRELGTCHN, LAMBDASER, KAPLAMBRATIO  Lab Results  Component Value Date   WBC 3.7 (L) 08/07/2017   NEUTROABS 1.5 08/07/2017   HGB 10.8 (L) 08/07/2017   HCT 33.1 (L) 08/07/2017   MCV 88.2 08/07/2017   PLT 260 08/07/2017    '@LASTCHEMISTRY' @  No results found for: LABCA2  No components found for: [id number]  No results for input(s): INR in the last 168 hours.  No results found for: LABCA2  No results found for: KKX381  No results found for: WEX937  No results found for: JIR678  No results found for: CA2729  No components found for: HGQUANT  No results found for: CEA1 / No results found for: CEA1   No results found for: AFPTUMOR  No results found for: CHROMOGRNA  No results found for: PSA1  Appointment on 08/07/2017  Component Date Value Ref Range Status  . Sodium 08/07/2017 140  136 - 145 mmol/L Final  . Potassium 08/07/2017 3.7  3.5 - 5.1 mmol/L Final  . Chloride 08/07/2017 107  98 - 109 mmol/L Final  . CO2 08/07/2017 23  22 - 29 mmol/L Final  . Glucose, Bld 08/07/2017 108  70 - 140 mg/dL Final  . BUN 08/07/2017 11  7 - 26 mg/dL Final  . Creatinine, Ser 08/07/2017 0.89  0.60 - 1.10 mg/dL Final  . Calcium 08/07/2017 9.6  8.4 - 10.4 mg/dL Final  . Total Protein 08/07/2017 7.3  6.4 - 8.3 g/dL Final  . Albumin 08/07/2017 3.9  3.5 - 5.0 g/dL Final  . AST 08/07/2017 18  5 - 34 U/L Final  . ALT 08/07/2017 13  0 - 55 U/L Final  . Alkaline Phosphatase 08/07/2017 80  40 - 150 U/L Final  . Total Bilirubin 08/07/2017 0.3  0.2 - 1.2 mg/dL Final  . GFR calc non Af Amer 08/07/2017 >60  >60 mL/min Final  . GFR calc Af Amer 08/07/2017 >60  >60 mL/min Final   Comment: (NOTE) The eGFR has been calculated using the CKD EPI equation. This calculation has not been validated in all clinical situations. eGFR's persistently <60 mL/min signify possible Chronic Kidney Disease.   Georgiann Hahn gap 08/07/2017 10  3 - 11 Final    Performed at [facility], [place] [address], [place], [place] [address]  . WBC 08/07/2017 3.7* 3.9 - 10.3 K/uL Final  . RBC 08/07/2017 3.75  3.70 - 5.45 MIL/uL Final  . Hemoglobin 08/07/2017 10.8* 11.6 - 15.9 g/dL Final  . HCT 08/07/2017 33.1* 34.8 - 46.6 % Final  . MCV 08/07/2017 88.2  79.5 - 101.0 fL Final  . MCH 08/07/2017 28.8  25.1 - 34.0 pg Final  . MCHC 08/07/2017 32.7  31.5 - 36.0 g/dL Final  . RDW 08/07/2017 13.1  11.2 - 14.5 % Final  . Platelets 08/07/2017 260  145 - 400 K/uL Final  . Neutrophils Relative % 08/07/2017 42  % Final  . Neutro Abs 08/07/2017 1.5  1.5 - 6.5 K/uL Final  . Lymphocytes Relative 08/07/2017 43  % Final  . Lymphs Abs 08/07/2017 1.6  0.9 - 3.3 K/uL Final  . Monocytes Relative 08/07/2017 10  % Final  . Monocytes Absolute 08/07/2017 0.4  0.1 - 0.9 K/uL Final  . Eosinophils Relative 08/07/2017 4  % Final  . Eosinophils Absolute 08/07/2017 0.1  0.0 - 0.5 K/uL Final  . Basophils Relative 08/07/2017 1  % Final  . Basophils Absolute 08/07/2017 0.0  0.0 - 0.1 K/uL Final   Performed at [facility], [place] [address], [place], [place] [address]    (this displays the last labs from the last 3 days)  No results found for: TOTALPROTELP, ALBUMINELP, A1GS, A2GS, BETS, BETA2SER, GAMS, MSPIKE, SPEI (this displays SPEP labs)  No results found for: KPAFRELGTCHN, LAMBDASER, KAPLAMBRATIO (kappa/lambda light chains)  No results found for: HGBA, HGBA2QUANT, HGBFQUANT, HGBSQUAN (Hemoglobinopathy evaluation)   No results found for: LDH  No results found for: IRON, TIBC, IRONPCTSAT (Iron and TIBC)  No results found for: FERRITIN  Urinalysis No results found for: COLORURINE, APPEARANCEUR, LABSPEC, PHURINE, GLUCOSEU, HGBUR, BILIRUBINUR, KETONESUR, PROTEINUR, UROBILINOGEN, NITRITE, LEUKOCYTESUR   STUDIES: Outside studies reviewed  ELIGIBLE FOR AVAILABLE RESEARCH PROTOCOL: no  ASSESSMENT: 67 y.o. [place] woman status post right breast upper outer quadrant biopsy 06/20/2017 for a clinical T1b N0, stage IA invasive ductal carcinoma, grade 3, estrogen and progesterone receptor negative, but HER-2 amplified, with an MIB-1 of 30%  (1) genetics testing 07/05/2017 through the Common Hereditary Cancer Panel offered by [facility] found no deleterious mutations in APC, ATM, AXIN2, BARD1, BMPR1A, BRCA1, BRCA2, BRIP1, CDH1, CDKN2A (p14ARF), CDKN2A (p16INK4a), CKD4, CHEK2, CTNNA1, DICER1, EPCAM (Deletion/duplication testing only), GREM1 (promoter region deletion/duplication testing only), KIT, MEN1, MLH1, MSH2, MSH3, MSH6, MUTYH, NBN, NF1, NHTL1, PALB2, PDGFRA, PMS2, POLD1, POLE, PTEN, RAD50, RAD51C, RAD51D, SDHB, SDHC, SDHD, SMAD4, SMARCA4. STK11, TP53, TSC1, TSC2, and VHL.  The following genes were evaluated for sequence changes only: SDHA and HOXB13 c.251G>A variant only.  (a) A Variant of uncertain significance in MSH2 was identified c.1331G>T (p.Arg444Leu).   (2) status post right lumpectomy and sentinel lymph node sampling 07/17/2017 for a pT1c pN0, stage IA invasive ductal carcinoma, grade 2, with negative margins.  A total of 5 lymph nodes were removed  (3) adjuvant chemotherapy will consist of paclitaxel weekly x12 together with trastuzumab every 21 days starting 08/07/2017  (4) trastuzumab will be continued to total 1 year  (a) echo on 08/02/17 demonstrates an ejection fraction of 65-70%  5) adjuvant radiation to follow  PLAN: April [name] is doing well today.  I reviewed her labs with her in detail. I reviewed possible adverse effects.  I reviewed her anti emetic regimen.  She verbalizes understanding of this.  She is hypertensive today.  I reviewed this with her.  She tells me that everywhere else she has a normal blood pressure.  She does not check her BP from home. She is asymptomatic, so we will see how it trends.  It may be helpful for her to check her bp outside of here.    April [name] will return  in one week for labs, f/u and her next treatment.  She knows to call for any questions or concerns prior to her next appointment with [place].    A total of (30) minutes of face-to-face time was spent with this patient with greater than 50% of that time in counseling and care-coordination.    Wilber Bihari, NP 08/07/17 12:48 PM Medical Oncology and Hematology [facility] [address] [place], [place] [address] Tel. [phone]    Fax. [phone]

## 2017-08-08 ENCOUNTER — Telehealth: Payer: Self-pay | Admitting: *Deleted

## 2017-08-08 ENCOUNTER — Telehealth: Payer: Self-pay | Admitting: Adult Health

## 2017-08-08 NOTE — Telephone Encounter (Signed)
Per 2/19 no los °

## 2017-08-08 NOTE — Telephone Encounter (Signed)
Pt called asking if she could resume drinking non caffeine green tea, ginger and tumeric hot teas. Per Dr. Jana Hakim she may resume. Discussed estrogen receptor negativity and what that meant regarding her breast cancer. Denies further questions or needs at this time.

## 2017-08-09 ENCOUNTER — Emergency Department (HOSPITAL_COMMUNITY): Payer: Medicare Other

## 2017-08-09 ENCOUNTER — Observation Stay (HOSPITAL_COMMUNITY)
Admission: EM | Admit: 2017-08-09 | Discharge: 2017-08-10 | Disposition: A | Discharge status: Home / Self Care | Payer: Medicare Other | Attending: Internal Medicine | Admitting: Internal Medicine

## 2017-08-09 ENCOUNTER — Other Ambulatory Visit: Payer: Self-pay

## 2017-08-09 ENCOUNTER — Encounter (HOSPITAL_COMMUNITY): Payer: Self-pay | Admitting: Emergency Medicine

## 2017-08-09 DIAGNOSIS — C50411 Malignant neoplasm of upper-outer quadrant of right female breast: Secondary | ICD-10-CM | POA: Insufficient documentation

## 2017-08-09 DIAGNOSIS — Z79899 Other long term (current) drug therapy: Secondary | ICD-10-CM | POA: Insufficient documentation

## 2017-08-09 DIAGNOSIS — I10 Essential (primary) hypertension: Secondary | ICD-10-CM | POA: Diagnosis not present

## 2017-08-09 DIAGNOSIS — C50911 Malignant neoplasm of unspecified site of right female breast: Secondary | ICD-10-CM | POA: Diagnosis not present

## 2017-08-09 DIAGNOSIS — C50919 Malignant neoplasm of unspecified site of unspecified female breast: Secondary | ICD-10-CM | POA: Diagnosis present

## 2017-08-09 DIAGNOSIS — G459 Transient cerebral ischemic attack, unspecified: Secondary | ICD-10-CM | POA: Diagnosis not present

## 2017-08-09 DIAGNOSIS — C50912 Malignant neoplasm of unspecified site of left female breast: Secondary | ICD-10-CM | POA: Diagnosis present

## 2017-08-09 DIAGNOSIS — Z170 Estrogen receptor positive status [ER+]: Secondary | ICD-10-CM | POA: Diagnosis present

## 2017-08-09 DIAGNOSIS — R262 Difficulty in walking, not elsewhere classified: Secondary | ICD-10-CM | POA: Insufficient documentation

## 2017-08-09 DIAGNOSIS — R4781 Slurred speech: Secondary | ICD-10-CM | POA: Diagnosis present

## 2017-08-09 LAB — CBC
HCT: 35.1 % — ABNORMAL LOW (ref 36.0–46.0)
Hemoglobin: 11.4 g/dL — ABNORMAL LOW (ref 12.0–15.0)
MCH: 28.7 pg (ref 26.0–34.0)
MCHC: 32.5 g/dL (ref 30.0–36.0)
MCV: 88.4 fL (ref 78.0–100.0)
Platelets: 270 10*3/uL (ref 150–400)
RBC: 3.97 MIL/uL (ref 3.87–5.11)
RDW: 13.5 % (ref 11.5–15.5)
WBC: 6.3 10*3/uL (ref 4.0–10.5)

## 2017-08-09 LAB — COMPREHENSIVE METABOLIC PANEL
ALT: 24 U/L (ref 14–54)
AST: 32 U/L (ref 15–41)
Albumin: 4.3 g/dL (ref 3.5–5.0)
Alkaline Phosphatase: 71 U/L (ref 38–126)
Anion gap: 11 (ref 5–15)
BUN: 11 mg/dL (ref 6–20)
CO2: 22 mmol/L (ref 22–32)
Calcium: 9.2 mg/dL (ref 8.9–10.3)
Chloride: 105 mmol/L (ref 101–111)
Creatinine, Ser: 0.84 mg/dL (ref 0.44–1.00)
GFR calc Af Amer: >60 mL/min (ref >60)
GFR calc non Af Amer: >60 mL/min (ref >60)
Glucose, Bld: 106 mg/dL — ABNORMAL HIGH (ref 65–99)
Potassium: 3.5 mmol/L (ref 3.5–5.1)
Sodium: 138 mmol/L (ref 135–145)
Total Bilirubin: 0.6 mg/dL (ref 0.3–1.2)
Total Protein: 7.5 g/dL (ref 6.5–8.1)

## 2017-08-09 LAB — I-STAT CHEM 8, ED
BUN: 16 mg/dL (ref 6–20)
Calcium, Ion: 1.1 mmol/L — ABNORMAL LOW (ref 1.15–1.40)
Chloride: 106 mmol/L (ref 101–111)
Creatinine, Ser: 0.8 mg/dL (ref 0.44–1.00)
Glucose, Bld: 103 mg/dL — ABNORMAL HIGH (ref 65–99)
HCT: 37 % (ref 36.0–46.0)
Hemoglobin: 12.6 g/dL (ref 12.0–15.0)
Potassium: 4.1 mmol/L (ref 3.5–5.1)
Sodium: 142 mmol/L (ref 135–145)
TCO2: 26 mmol/L (ref 22–32)

## 2017-08-09 LAB — URINALYSIS, ROUTINE W REFLEX MICROSCOPIC
Bilirubin Urine: NEGATIVE
Glucose, UA: NEGATIVE mg/dL
Hgb urine dipstick: NEGATIVE
Ketones, ur: NEGATIVE mg/dL
Leukocytes, UA: NEGATIVE
Nitrite: NEGATIVE
Protein, ur: NEGATIVE mg/dL
Specific Gravity, Urine: 1.004 — ABNORMAL LOW (ref 1.005–1.030)
pH: 6 (ref 5.0–8.0)

## 2017-08-09 LAB — DIFFERENTIAL
Basophils Absolute: 0 10*3/uL (ref 0.0–0.1)
Basophils Relative: 1 %
Eosinophils Absolute: 0.1 10*3/uL (ref 0.0–0.7)
Eosinophils Relative: 1 %
Lymphocytes Relative: 49 %
Lymphs Abs: 3.1 10*3/uL (ref 0.7–4.0)
Monocytes Absolute: 0.2 10*3/uL (ref 0.1–1.0)
Monocytes Relative: 4 %
Neutro Abs: 2.8 10*3/uL (ref 1.7–7.7)
Neutrophils Relative %: 45 %

## 2017-08-09 LAB — RAPID URINE DRUG SCREEN, HOSP PERFORMED
Amphetamines: NOT DETECTED
Barbiturates: NOT DETECTED
Benzodiazepines: NOT DETECTED
Cocaine: NOT DETECTED
Opiates: NOT DETECTED
Tetrahydrocannabinol: NOT DETECTED

## 2017-08-09 LAB — I-STAT TROPONIN, ED: Troponin i, poc: 0.03 ng/mL (ref 0.00–0.08)

## 2017-08-09 LAB — CBG MONITORING, ED: Glucose-Capillary: 95 mg/dL (ref 65–99)

## 2017-08-09 LAB — APTT: aPTT: 29 seconds (ref 24–36)

## 2017-08-09 LAB — PROTIME-INR
INR: 1.06
Prothrombin Time: 13.7 seconds (ref 11.4–15.2)

## 2017-08-09 MED ORDER — ASPIRIN 81 MG PO CHEW
324.0000 mg | CHEWABLE_TABLET | Freq: Once | ORAL | Status: AC
  Administered 2017-08-09: 324 mg via ORAL
  Filled 2017-08-09: qty 4

## 2017-08-09 MED ORDER — GADOBENATE DIMEGLUMINE 529 MG/ML IV SOLN
15.0000 mL | Freq: Once | INTRAVENOUS | Status: AC | PRN
  Administered 2017-08-09: 15 mL via INTRAVENOUS

## 2017-08-09 NOTE — ED Triage Notes (Signed)
Patient here with swollen tongue and feeling of it being thick.  She states she was having trouble forming words with her tongue.  She does have a slight facial droop on the left, she has equal hand grips and pedal pushes.  Patient is hypertensive in triage.  She is on active chemo that started on Tuesday.

## 2017-08-09 NOTE — ED Notes (Signed)
Patient transported to MRI 

## 2017-08-09 NOTE — ED Notes (Addendum)
Patient transported to CT scan with RN and NT .

## 2017-08-09 NOTE — ED Provider Notes (Signed)
Patient placed in Quick Look pathway, seen and evaluated   Chief Complaint: slurred speech  HPI:   67 year old female presents today with complaints of slurred speech.  Patient notes that symptoms started approximately 1.5 hours prior to arrival.  She notes she had difficulty getting her words out of her mouth, felt like her tongue was thick.  She denies any other associated neurological deficits, denies any severe pain.  Patient denies any history of stroke.  She currently is undergoing treatment for breast cancer.  ROS: Slurred speech (one)  Physical Exam:   Gen: No distress  Neuro: Awake and Alert  Skin: Warm    Focused Exam: Subtle flattening of the left nasolabial fold, drooping of the left eye and weakness of the left mouth-speech clear, cognition intact, no other acute neurological deficits, visual fields and vision intact.  Neurology was consulted who recommended seeing with head CT followed by MRI with and without contrast no code stroke at this time.   Initiation of care has begun. The patient has been counseled on the process, plan, and necessity for staying for the completion/evaluation, and the remainder of the medical screening examination    Francee Gentile 08/09/17 2213    Daleen Bo, MD 08/10/17 1218

## 2017-08-09 NOTE — ED Notes (Signed)
Dr. Leonel Ramsay at bedside evaluating pt.

## 2017-08-09 NOTE — ED Notes (Signed)
Patient currently at MRI

## 2017-08-09 NOTE — Consult Note (Addendum)
Neurology Consultation Reason for Consult: Slurred Speech Referring Physician: Little, R  CC: "Thick tongue"  History is obtained from:Patient  HPI: April [name] is a 67 y.o. female with a history of grade 3 invasive ductal carcinoma, stage IA. She was in her normal state of health until tonight around 8:00 pm when she began slurring her words.  This is been coming and going since it began, sometimes being almost completely gone and sometimes being very prominent.  She has always had at least a mild left facial droop since it began, however.   LKW: 8:00 PM tpa given?: no, mild symptoms Premorbid modified rankin scale: 0    ROS: A 14 point ROS was performed and is negative except as noted in the HPI.    Past Medical History:  Diagnosis Date  . Breast cancer ([place]) 05/2017   right breast  . Eczema   . Family history of breast cancer   . Family history of ovarian cancer   . Hypertension    toxemia during pregnancy, no meds now     Family History  Problem Relation Age of Onset  . Breast cancer Mother 47       again at 40 in other breast   . Heart attack Father 38  . Breast cancer Sister 71  . Breast cancer Maternal Grandmother 37       spread to lungs, died at 13  . Ovarian cancer Cousin 86     Social History:  reports that  has never smoked. she has never used smokeless tobacco. She reports that she drinks alcohol. She reports that she does not use drugs.   Exam: Current vital signs: BP (!) 217/116   Pulse 93   Temp 98.1 F (36.7 C)   Resp 16   Ht 5' 3.5" (1.613 m)   Wt 69.8 kg (153 lb 14.1 oz)   SpO2 100%   BMI 26.83 kg/m  Vital signs in last 24 hours: Temp:  [98.1 F (36.7 C)-98.5 F (36.9 C)] 98.1 F (36.7 C) (02/21 2227) Pulse Rate:  [72-93] 93 (02/21 2230) Resp:  [15-16] 16 (02/21 2216) BP: (190-232)/(83-116) 217/116 (02/21 2230) SpO2:  [97 %-100 %] 100 % (02/21 2230) Weight:  [69.8 kg (153 lb 14.1 oz)] 69.8 kg (153 lb 14.1 oz) (02/21  2219)   Physical Exam  Constitutional: Appears well-developed and well-nourished.  Psych: Affect appropriate to situation Eyes: No scleral injection HENT: No OP obstrucion Head: Normocephalic.  Cardiovascular: Normal rate and regular rhythm.  Respiratory: Effort normal, non-labored breathing GI: Soft.  No distension. There is no tenderness.  Skin: WDI  Neuro: Mental Status: Patient is awake, alert, oriented to person, place, month, year, and situation. Patient is able to give a clear and coherent history. No signs of aphasia or neglect Cranial Nerves: II: Visual Fields are full. Pupils are equal, round, and reactive to light.   III,IV, VI: EOMI without ptosis or diploplia.  V: Facial sensation is symmetric to temperature VII: Facial movement with mild left facial weakness VIII: hearing is intact to voice X: Uvula elevates symmetrically XI: Shoulder shrug is symmetric. XII: tongue is midline without atrophy or fasciculations.  Motor: Tone is normal. Bulk is normal. 5/5 strength was present in all four extremities.  Sensory: Sensation is symmetric to light touch and temperature in the arms and legs. Cerebellar: FNF and HKS are intact bilaterally   I have reviewed labs in epic and the results pertinent to this consultation are: CMP-unremarkable  I  have reviewed the images obtained: CMP-unremarkable  Impression: 67 year old female with left facial droop and slurred speech.  I suspect that she is having stuttering TIA/mild ischemic stroke.  Even at her worst, her symptoms are not severe enough to merit IV TPA and therefore I would not pursue this.  She will need an MRI to exclude CNS metastasis, but given the stage IA nature of her disease I doubt this.  Recommendations: 1) MRI brain, stroke/TIA workup if no signs of metastasis. 2) STAT ASA   Roland Rack, MD [facility] [phone]  If 7pm- 7am, please page neurology on call as listed in [place].

## 2017-08-09 NOTE — ED Notes (Addendum)
Returned from CT scan , pt. placed on a monitor and pulse oximetry , patient denies pain /respirations unlabored stated symptoms have resolved , speech clear/ mild left facial droop asymmetry . Denies pain/respirations unlabored . Spouse at bedside .

## 2017-08-09 NOTE — ED Provider Notes (Signed)
[facility] EMERGENCY DEPARTMENT Provider Note   CSN: [id number] Arrival date & time: 08/09/17  2125     History   Chief Complaint Chief Complaint  Patient presents with  . Facial Droop/Dysarthria    HPI April [name] is a 67 y.o. female.  67 year old female with past medical history including breast cancer, eczema who presents with speech problems.  Approximately 1.5 hours prior to arrival to the ED, she had a sudden onset of feeling like her tongue was thick and she had a hard time forming words. She denies any extremity weakness/numbness, HA, visual changes, balance problems, N/V, or other complaints.  This is never happened before.  She denies any fevers or recent illness.  She had her first dose of chemo earlier this week.  She denies any drug or alcohol use.  No family history of stroke.   The history is provided by the patient.    Past Medical History:  Diagnosis Date  . Breast cancer ([place]) 05/2017   right breast  . Eczema   . Family history of breast cancer   . Family history of ovarian cancer   . Hypertension    toxemia during pregnancy, no meds now    Patient Active Problem List   Diagnosis Date Noted  . TIA (transient ischemic attack) 08/09/2017  . Hypertension   . Genetic testing 07/06/2017  . Family history of ovarian cancer   . Family history of breast cancer   . Malignant neoplasm of upper-outer quadrant of right breast in female, estrogen receptor negative ([place]) 06/26/2017  . Breast cancer ([place]) 05/19/2017    Past Surgical History:  Procedure Laterality Date  . ABDOMINAL HYSTERECTOMY    . BREAST LUMPECTOMY WITH RADIOACTIVE SEED AND SENTINEL LYMPH NODE BIOPSY Right 07/17/2017   Procedure: BREAST LUMPECTOMY WITH RADIOACTIVE SEED AND SENTINEL LYMPH NODE BIOPSY;  Surgeon: Rolm Bookbinder, MD;  Location: [place];  Service: General;  Laterality: Right;  . CESAREAN SECTION     x4  . DILATION AND CURETTAGE OF  UTERUS    . KNEE SURGERY Right   . PORTACATH PLACEMENT N/A 07/17/2017   Procedure: INSERTION PORT-A-CATH WITH [place];  Surgeon: Rolm Bookbinder, MD;  Location: [place];  Service: General;  Laterality: N/A;    OB History    No data available       Home Medications    Prior to Admission medications   Medication Sig Start Date End Date Taking? Authorizing Provider  CALCIUM-VITAMIN D PO Take 1 tablet by mouth daily.    [provider]  GENERIC EXTERNAL MEDICATION Take 1 tablet by mouth daily.    [provider]  GENERIC EXTERNAL MEDICATION Take 16 oz by mouth daily.    [provider]  GENERIC EXTERNAL MEDICATION Take 8 oz by mouth daily.    [provider]  lidocaine-prilocaine (EMLA) cream Apply to affected area once 08/03/17   Magrinat, Virgie Dad, MD  LORazepam (ATIVAN) 0.5 MG tablet Take 1 tablet (0.5 mg total) by mouth every 6 (six) hours as needed (Nausea or vomiting). Patient not taking: Reported on 08/07/2017 08/03/17   Magrinat, Virgie Dad, MD  Multiple Vitamin (MULTIVITAMIN WITH MINERALS) TABS tablet Take 1 tablet by mouth daily.    [provider]  ondansetron (ZOFRAN) 8 MG tablet Take 1 tablet (8 mg total) by mouth 2 (two) times daily as needed (Nausea or vomiting). 08/03/17   Magrinat, Virgie Dad, MD  oxyCODONE (OXY IR/ROXICODONE) 5 MG  immediate release tablet Take 1 tablet (5 mg total) by mouth every 6 (six) hours as needed for moderate pain, severe pain or breakthrough pain. Patient not taking: Reported on 08/07/2017 07/17/17   Rolm Bookbinder, MD  prochlorperazine (COMPAZINE) 10 MG tablet Take 1 tablet (10 mg total) by mouth every 6 (six) hours as needed (Nausea or vomiting). 08/03/17   Magrinat, Virgie Dad, MD  vitamin B-12 (CYANOCOBALAMIN) 1000 MCG tablet Take 1,000 mcg by mouth daily.    [provider]    Family History Family History  Problem Relation Age of Onset  . Breast cancer Mother 30       again at  83 in other breast   . Heart attack Father 61  . Breast cancer Sister 92  . Breast cancer Maternal Grandmother 43       spread to lungs, died at 3  . Ovarian cancer Cousin 11    Social History Social History   Tobacco Use  . Smoking status: Never Smoker  . Smokeless tobacco: Never Used  Substance Use Topics  . Alcohol use: Yes    Comment: social  . Drug use: No     Allergies   Tape; Nsaids; and Sulfa antibiotics   Review of Systems Review of Systems All other systems reviewed and are negative except that which was mentioned in HPI   Physical Exam Updated Vital Signs BP (!) 217/116   Pulse 93   Temp 98.1 F (36.7 C)   Resp 16   Ht 5' 3.5" (1.613 m)   Wt 69.8 kg (153 lb 14.1 oz)   SpO2 100%   BMI 26.83 kg/m   Physical Exam  Constitutional: She is oriented to person, place, and time. She appears well-developed and well-nourished. No distress.  Awake, alert  HENT:  Head: Normocephalic and atraumatic.  Eyes: Conjunctivae and EOM are normal. Pupils are equal, round, and reactive to light.  Neck: Neck supple.  Cardiovascular: Normal rate, regular rhythm and normal heart sounds.  No murmur heard. Pulmonary/Chest: Effort normal and breath sounds normal. No respiratory distress.  Abdominal: Soft. Bowel sounds are normal. She exhibits no distension. There is no tenderness.  Musculoskeletal: She exhibits no edema.  Neurological: She is alert and oriented to person, place, and time. She has normal reflexes. She exhibits normal muscle tone.  Fluent speech, normal finger-to-nose testing, negative pronator drift, no clonus 5/5 strength and normal sensation x all 4 extremities Very subtle facial asymmetry at mouth, slightly weak on right corner of mouth; remainder of CN II-XII intact  Skin: Skin is warm and dry.  Psychiatric: She has a normal mood and affect. Judgment and thought content normal.  Nursing note and vitals reviewed.    ED Treatments / Results   Labs (all labs ordered are listed, but only abnormal results are displayed) Labs Reviewed  CBC - Abnormal; Notable for the following components:      Result Value   Hemoglobin 11.4 (*)    HCT 35.1 (*)    All other components within normal limits  COMPREHENSIVE METABOLIC PANEL - Abnormal; Notable for the following components:   Glucose, Bld 106 (*)    All other components within normal limits  URINALYSIS, ROUTINE W REFLEX MICROSCOPIC - Abnormal; Notable for the following components:   Color, Urine COLORLESS (*)    Specific Gravity, Urine 1.004 (*)    All other components within normal limits  I-STAT CHEM 8, ED - Abnormal; Notable for the following components:   Glucose, Bld  103 (*)    Calcium, Ion 1.10 (*)    All other components within normal limits  PROTIME-INR  APTT  DIFFERENTIAL  RAPID URINE DRUG SCREEN, HOSP PERFORMED  ETHANOL  I-STAT TROPONIN, ED  CBG MONITORING, ED    EKG  EKG Interpretation None       Radiology Ct Head Code Stroke Wo Contrast  Result Date: 08/09/2017 CLINICAL DATA:  Code stroke.  67 y/o  F; left-sided facial droop. EXAM: CT HEAD WITHOUT CONTRAST TECHNIQUE: Contiguous axial images were obtained from the base of the skull through the vertex without intravenous contrast. COMPARISON:  None. FINDINGS: Brain: No evidence of acute infarction, hemorrhage, hydrocephalus, extra-axial collection or mass lesion/mass effect. Few nonspecific foci of hypoattenuation in subcortical and periventricular white matter are compatible with mild chronic microvascular ischemic changes. Vascular: No hyperdense vessel or unexpected calcification. Skull: Normal. Negative for fracture or focal lesion. Sinuses/Orbits: No acute finding. Other: None. ASPECTS [facility] Stroke Program Early CT Score) - Ganglionic level infarction (caudate, lentiform nuclei, internal capsule, insula, M1-M3 cortex): 7 - Supraganglionic infarction (M4-M6 cortex): 3 Total score (0-10 with 10 being normal):  10 IMPRESSION: 1. No acute intracranial abnormality identified. 2. Mild chronic microvascular ischemic changes of the brain. 3. ASPECTS is 10 These results were communicated to Dr. Leonel Ramsay at 10:06 pmon 2/21/2019by text page via the [facility] messaging system. Electronically Signed   By: Kristine Garbe M.D.   On: 08/09/2017 22:07    Procedures Procedures (including critical care time)  Medications Ordered in ED Medications  aspirin chewable tablet 324 mg (324 mg Oral Given 08/09/17 2239)  gadobenate dimeglumine (MULTIHANCE) injection 15 mL (15 mLs Intravenous Contrast Given 08/09/17 2324)     Initial Impression / Assessment and Plan / ED Course  I have reviewed the triage vital signs and the nursing notes.  Pertinent labs & imaging results that were available during my care of the patient were reviewed by me and considered in my medical decision making (see chart for details).    PT without obvious aphasia or dysarthria during my initial exam. Based on initial exam, contacted Dr. Leonel Ramsay who agreed with not calling Code Stroke but rather taking immediately to CT scanner to initiate work up as she would not currently be tPA candidate.  Labs reassuring, head CT negative acute. I have ordered MRI brain w/ and w/o contrast. Discussed TIA admission with Dr. Blaine Hamper, hospitalist, and pt admitted for further evaluation.  Final Clinical Impressions(s) / ED Diagnoses   Final diagnoses:  TIA (transient ischemic attack)    ED Discharge Orders    None       Brette Cast, Wenda Overland, MD 08/10/17 [phone]

## 2017-08-09 NOTE — H&P (Signed)
History and Physical    April [name] ENI:[id number] DOB: 1950/10/11 DOA: 08/09/2017  Referring MD/NP/PA:   PCP: Kelton Pillar, MD   Patient coming from:  The patient is coming from home.  At baseline, pt is independent for most of ADL.   Chief Complaint: Difficulty speaking, slurred speech and left facial droop  HPI: April [name] is a 67 y.o. female with medical history significant of hypertension, prostate cancer (s/p of R lumpectomy and radioactive seed placement), who presents with difficulty speaking and left facial droop.  Patient states that began to have difficulty speaking and slurred speech at about 7:30 PM. She was noted to have mild left facial droop. She states that she felt her tongue was thicker. She denies unilateral weakness, numbness or tingliness in extremities. No vision change or hearing loss. Her symptoms have resolved at arrival to the ED. Patient denies chest pain, shortness breath, cough, fever or chills. No nausea, vomiting or abdominal pain. She states that she had 3 loose stool bowel movement which she contributes to ongoing chemotherapy. Patient has history of grade 3 invasive ductal carcinoma, stage IA. She is s/p of R lumpectomy with radioactive seed placement. She was started chemotherapy on Tuesday. Patient was found to have elevated blood pressure 232/83.   ED Course: pt was found to have WBC 6.3, INR 1.06, negative urinalysis, electrolytes and renal function okay, temperature normal, oxygen saturation 97% on room air. CT head is negative. MRI of brain w and w/o contrast negative. Patient is placed on telemetry bed for observation. Neurology, Dr. Leonel Ramsay was consulted.  Review of Systems:   General: no fevers, chills, no body weight gain, has fatigue HEENT: no blurry vision, hearing changes or sore throat Respiratory: no dyspnea, coughing, wheezing CV: no chest pain, no palpitations GI: no nausea, vomiting, abdominal pain, has diarrhea, no  constipation GU: no dysuria, burning on urination, increased urinary frequency, hematuria  Ext: no leg edema Neuro: no unilateral weakness, numbness, or tingling, no vision change or hearing loss. Has difficulty speaking, left facial droop Skin: no rash, no skin tear. MSK: No muscle spasm, no deformity, no limitation of range of movement in spin Heme: No easy bruising.  Travel history: No recent long distant travel.  Allergy:  Allergies  Allergen Reactions  . Tape Itching and Rash  . Nsaids   . Sulfa Antibiotics Rash    Past Medical History:  Diagnosis Date  . Breast cancer ([place]) 05/2017   right breast  . Eczema   . Family history of breast cancer   . Family history of ovarian cancer   . Hypertension    toxemia during pregnancy, no meds now    Past Surgical History:  Procedure Laterality Date  . ABDOMINAL HYSTERECTOMY    . BREAST LUMPECTOMY WITH RADIOACTIVE SEED AND SENTINEL LYMPH NODE BIOPSY Right 07/17/2017   Procedure: BREAST LUMPECTOMY WITH RADIOACTIVE SEED AND SENTINEL LYMPH NODE BIOPSY;  Surgeon: Rolm Bookbinder, MD;  Location: [place];  Service: General;  Laterality: Right;  . CESAREAN SECTION     x4  . DILATION AND CURETTAGE OF UTERUS    . KNEE SURGERY Right   . PORTACATH PLACEMENT N/A 07/17/2017   Procedure: INSERTION PORT-A-CATH WITH [place];  Surgeon: Rolm Bookbinder, MD;  Location: [place];  Service: General;  Laterality: N/A;    Social History:  reports that  has never smoked. she has never used smokeless tobacco. She reports that she drinks alcohol. She reports that she does  not use drugs.  Family History:  Family History  Problem Relation Age of Onset  . Breast cancer Mother 81       again at 66 in other breast   . Heart attack Father 54  . Breast cancer Sister 61  . Breast cancer Maternal Grandmother 108       spread to lungs, died at 31  . Ovarian cancer Cousin 61     Prior to Admission medications   Medication  Sig Start Date End Date Taking? Authorizing Provider  CALCIUM-VITAMIN D PO Take 1 tablet by mouth daily.    [provider]  GENERIC EXTERNAL MEDICATION Take 1 tablet by mouth daily.    [provider]  GENERIC EXTERNAL MEDICATION Take 16 oz by mouth daily.    [provider]  GENERIC EXTERNAL MEDICATION Take 8 oz by mouth daily.    [provider]  lidocaine-prilocaine (EMLA) cream Apply to affected area once 08/03/17   Magrinat, Virgie Dad, MD  LORazepam (ATIVAN) 0.5 MG tablet Take 1 tablet (0.5 mg total) by mouth every 6 (six) hours as needed (Nausea or vomiting). Patient not taking: Reported on 08/07/2017 08/03/17   Magrinat, Virgie Dad, MD  Multiple Vitamin (MULTIVITAMIN WITH MINERALS) TABS tablet Take 1 tablet by mouth daily.    [provider]  ondansetron (ZOFRAN) 8 MG tablet Take 1 tablet (8 mg total) by mouth 2 (two) times daily as needed (Nausea or vomiting). 08/03/17   Magrinat, Virgie Dad, MD  oxyCODONE (OXY IR/ROXICODONE) 5 MG immediate release tablet Take 1 tablet (5 mg total) by mouth every 6 (six) hours as needed for moderate pain, severe pain or breakthrough pain. Patient not taking: Reported on 08/07/2017 07/17/17   Rolm Bookbinder, MD  prochlorperazine (COMPAZINE) 10 MG tablet Take 1 tablet (10 mg total) by mouth every 6 (six) hours as needed (Nausea or vomiting). 08/03/17   Magrinat, Virgie Dad, MD  vitamin B-12 (CYANOCOBALAMIN) 1000 MCG tablet Take 1,000 mcg by mouth daily.    [provider]    Physical Exam: Vitals:   08/10/17 0130 08/10/17 0213 08/10/17 0243 08/10/17 0413  BP: (!) 171/69 (!) 193/70 (!) 183/73 (!) 152/57  Pulse: 66 65  77  Resp: 20 18  16   Temp:  98 F (36.7 C)  98.6 F (37 C)  TempSrc:  Oral  Oral  SpO2: 97% 100%  99%  Weight:  69.9 kg (154 lb 1.6 oz)    Height:  5' 3.5" (1.613 m)     General: Not in acute distress HEENT:       Eyes: PERRL, EOMI, no scleral icterus.       ENT: No discharge from the  ears and nose, no pharynx injection, no tonsillar enlargement.        Neck: No JVD, no bruit, no mass felt. Heme: No neck lymph node enlargement. Cardiac: S1/S2, RRR, No murmurs, No gallops or rubs. Respiratory:  No rales, wheezing, rhonchi or rubs. GI: Soft, nondistended, nontender, no rebound pain, no organomegaly, BS present. GU: No hematuria Ext: No pitting leg edema bilaterally. 2+DP/PT pulse bilaterally. Musculoskeletal: No joint deformities, No joint redness or warmth, no limitation of ROM in spin. Skin: No rashes.  Neuro: Alert, oriented X3, cranial nerves II-XII grossly intact, moves all extremities normally.  Psych: Patient is not psychotic, no suicidal or hemocidal ideation.  Labs on Admission: I have personally reviewed following labs and imaging studies  CBC: Recent Labs  Lab 08/07/17 0932 08/09/17 2143 08/09/17  2155  WBC 3.7* 6.3  --   NEUTROABS 1.5 2.8  --   HGB 10.8* 11.4* 12.6  HCT 33.1* 35.1* 37.0  MCV 88.2 88.4  --   PLT 260 270  --    Basic Metabolic Panel: Recent Labs  Lab 08/07/17 0932 08/09/17 2143 08/09/17 2155  NA 140 138 142  K 3.7 3.5 4.1  CL 107 105 106  CO2 23 22  --   GLUCOSE 108 106* 103*  BUN 11 11 16   CREATININE 0.89 0.84 0.80  CALCIUM 9.6 9.2  --    GFR: Estimated Creatinine Clearance: 64.7 mL/min (by C-G formula based on SCr of 0.8 mg/dL). Liver Function Tests: Recent Labs  Lab 08/07/17 0932 08/09/17 2143  AST 18 32  ALT 13 24  ALKPHOS 80 71  BILITOT 0.3 0.6  PROT 7.3 7.5  ALBUMIN 3.9 4.3   No results for input(s): LIPASE, AMYLASE in the last 168 hours. No results for input(s): AMMONIA in the last 168 hours. Coagulation Profile: Recent Labs  Lab 08/09/17 2143  INR 1.06   Cardiac Enzymes: No results for input(s): CKTOTAL, CKMB, CKMBINDEX, TROPONINI in the last 168 hours. BNP (last 3 results) No results for input(s): PROBNP in the last 8760 hours. HbA1C: Recent Labs    08/10/17 0217  HGBA1C 5.9*   CBG: Recent  Labs  Lab 08/09/17 2209  GLUCAP 95   Lipid Profile: Recent Labs    08/10/17 0217  CHOL 273*  HDL 84  LDLCALC 163*  TRIG 132  CHOLHDL 3.3   Thyroid Function Tests: No results for input(s): TSH, T4TOTAL, FREET4, T3FREE, THYROIDAB in the last 72 hours. Anemia Panel: No results for input(s): VITAMINB12, FOLATE, FERRITIN, TIBC, IRON, RETICCTPCT in the last 72 hours. Urine analysis:    Component Value Date/Time   COLORURINE COLORLESS (A) 08/09/2017 2224   APPEARANCEUR CLEAR 08/09/2017 2224   LABSPEC 1.004 (L) 08/09/2017 2224   PHURINE 6.0 08/09/2017 2224   GLUCOSEU NEGATIVE 08/09/2017 2224   HGBUR NEGATIVE 08/09/2017 2224   BILIRUBINUR NEGATIVE 08/09/2017 2224   KETONESUR NEGATIVE 08/09/2017 2224   PROTEINUR NEGATIVE 08/09/2017 2224   NITRITE NEGATIVE 08/09/2017 2224   LEUKOCYTESUR NEGATIVE 08/09/2017 2224   Sepsis Labs: @LABRCNTIP (procalcitonin:4,lacticidven:4) )No results found for this or any previous visit (from the past 240 hour(s)).   Radiological Exams on Admission: Mr Jeri Cos And Wo Contrast  Result Date: 08/09/2017 CLINICAL DATA:  Slurred speech. Suspect stroke. History of breast cancer. EXAM: MRI HEAD WITHOUT AND WITH CONTRAST TECHNIQUE: Multiplanar, multiecho pulse sequences of the brain and surrounding structures were obtained without and with intravenous contrast. CONTRAST:  29mL MULTIHANCE GADOBENATE DIMEGLUMINE 529 MG/ML IV SOLN COMPARISON:  CT HEAD August 09, 2017 FINDINGS: INTRACRANIAL CONTENTS: No reduced diffusion to suggest acute ischemia, hyperacute demyelination or hypercellular tumor. No susceptibility artifact to suggest hemorrhage. The ventricles and sulci are normal for patient's age. Patchy supratentorial white matter FLAIR T2 hyperintensities compatible with mild chronic small vessel ischemic disease, normal for age. No suspicious parenchymal signal, masses, mass effect. No abnormal intraparenchymal or extra-axial enhancement. No abnormal extra-axial  fluid collections. No extra-axial masses. VASCULAR: Normal major intracranial vascular flow voids present at skull base. SKULL AND UPPER CERVICAL SPINE: No abnormal sellar expansion. No suspicious calvarial bone marrow signal. Craniocervical junction maintained. SINUSES/ORBITS: The mastoid air-cells and included paranasal sinuses are well-aerated.The included ocular globes and orbital contents are non-suspicious. OTHER: None. IMPRESSION: Negative MRI of the head with and without contrast for age. Electronically Signed  By: Elon Alas M.D.   On: 08/09/2017 23:58   Ct Head Code Stroke Wo Contrast  Result Date: 08/09/2017 CLINICAL DATA:  Code stroke.  67 y/o  F; left-sided facial droop. EXAM: CT HEAD WITHOUT CONTRAST TECHNIQUE: Contiguous axial images were obtained from the base of the skull through the vertex without intravenous contrast. COMPARISON:  None. FINDINGS: Brain: No evidence of acute infarction, hemorrhage, hydrocephalus, extra-axial collection or mass lesion/mass effect. Few nonspecific foci of hypoattenuation in subcortical and periventricular white matter are compatible with mild chronic microvascular ischemic changes. Vascular: No hyperdense vessel or unexpected calcification. Skull: Normal. Negative for fracture or focal lesion. Sinuses/Orbits: No acute finding. Other: None. ASPECTS [facility] Stroke Program Early CT Score) - Ganglionic level infarction (caudate, lentiform nuclei, internal capsule, insula, M1-M3 cortex): 7 - Supraganglionic infarction (M4-M6 cortex): 3 Total score (0-10 with 10 being normal): 10 IMPRESSION: 1. No acute intracranial abnormality identified. 2. Mild chronic microvascular ischemic changes of the brain. 3. ASPECTS is 10 These results were communicated to Dr. Leonel Ramsay at 10:06 pmon 2/21/2019by text page via the [facility] messaging system. Electronically Signed   By: Kristine Garbe M.D.   On: 08/09/2017 22:07     EKG: Independently reviewed.  Sinus  rhythm, QTC 427, no ischemic change.   Assessment/Plan Principal Problem:   TIA (transient ischemic attack) Active Problems:   Hypertension   Breast cancer ([facility])   TIA (transient ischemic attack): Etiology is not clear. CT head is negative. MRI of brain with and without contrast negative. Neurology was consulted. Differential diagnoses include migraine versus TIA, Dr. Leonel Ramsay recommended TIA workup.  - will place to tele bed - Appreciate Dr. Leonel Ramsay consultation, the follow-up recommendations - Risk factor modification: HgbA1c, fasting lipid panel - MRA of the brain without contrast  - PT consult, OT consult - Bedside swallowing screen was ordered, will get speech consult in AM - 2 d Echocardiogram  - Carotid dopplers  - Aspirin  Hypertension: bp is 232/83-->152/57. Not on med at home. -will allow permissive hypertension -When necessary hydralazine for SBP>220  Breast cancer [facility]): f/u with Dr. Jana Hakim.  Patient has history of grade 3 invasive ductal carcinoma, stage IA. She is s/p of R lumpectomy with radioactive seed placement. She was started chemotherapy on Tuesday. -f/u with Dr. Jana Hakim   DVT ppx: SQ Lovenox Code Status: Full code Family Communication: yes, husband Disposition Plan:  Anticipate discharge back to previous home environment Consults called:  Neurology, Dr. Leonel Ramsay Admission status: Obs / tele   Date of Service 08/10/2017    Ivor Costa [facility] Pager [phone]  If 7PM-7AM, please contact night-coverage [email or web address] Password [facility] 08/10/2017, 5:31 AM

## 2017-08-10 ENCOUNTER — Observation Stay (HOSPITAL_BASED_OUTPATIENT_CLINIC_OR_DEPARTMENT_OTHER): Payer: Medicare Other

## 2017-08-10 ENCOUNTER — Other Ambulatory Visit: Payer: Self-pay

## 2017-08-10 ENCOUNTER — Ambulatory Visit: Payer: Medicare Other | Admitting: Oncology

## 2017-08-10 ENCOUNTER — Observation Stay (HOSPITAL_COMMUNITY): Payer: Medicare Other

## 2017-08-10 DIAGNOSIS — E785 Hyperlipidemia, unspecified: Secondary | ICD-10-CM

## 2017-08-10 DIAGNOSIS — I10 Essential (primary) hypertension: Secondary | ICD-10-CM

## 2017-08-10 DIAGNOSIS — G459 Transient cerebral ischemic attack, unspecified: Secondary | ICD-10-CM

## 2017-08-10 DIAGNOSIS — R7303 Prediabetes: Secondary | ICD-10-CM

## 2017-08-10 DIAGNOSIS — E1169 Type 2 diabetes mellitus with other specified complication: Secondary | ICD-10-CM

## 2017-08-10 DIAGNOSIS — C50911 Malignant neoplasm of unspecified site of right female breast: Secondary | ICD-10-CM | POA: Diagnosis not present

## 2017-08-10 LAB — ECHOCARDIOGRAM LIMITED
Height: 63.5 in
Weight: 2465.62 oz

## 2017-08-10 LAB — LIPID PANEL
Cholesterol: 273 mg/dL — ABNORMAL HIGH (ref 0–200)
HDL: 84 mg/dL (ref >40)
LDL Cholesterol: 163 mg/dL — ABNORMAL HIGH (ref 0–99)
Total CHOL/HDL Ratio: 3.3 RATIO
Triglycerides: 132 mg/dL (ref <150)
VLDL: 26 mg/dL (ref 0–40)

## 2017-08-10 LAB — HEMOGLOBIN A1C
Hgb A1c MFr Bld: 5.9 % — ABNORMAL HIGH (ref 4.8–5.6)
Mean Plasma Glucose: 122.63 mg/dL

## 2017-08-10 LAB — ETHANOL: Alcohol, Ethyl (B): <10 mg/dL (ref <10)

## 2017-08-10 MED ORDER — ENOXAPARIN SODIUM 40 MG/0.4ML ~~LOC~~ SOLN
40.0000 mg | SUBCUTANEOUS | Status: DC
  Administered 2017-08-10: 40 mg via SUBCUTANEOUS
  Filled 2017-08-10: qty 0.4

## 2017-08-10 MED ORDER — ACETAMINOPHEN 160 MG/5ML PO SOLN
650.0000 mg | ORAL | Status: DC | PRN
Start: 2017-08-10 — End: 2017-08-10

## 2017-08-10 MED ORDER — CALCIUM CARBONATE-VITAMIN D 500-200 MG-UNIT PO TABS
1.0000 | ORAL_TABLET | Freq: Every day | ORAL | Status: DC
  Administered 2017-08-10: 11:00:00 1 via ORAL
  Filled 2017-08-10: qty 1

## 2017-08-10 MED ORDER — ACETAMINOPHEN 650 MG RE SUPP: 650.0000 mg | RECTAL | Status: DC | PRN

## 2017-08-10 MED ORDER — ASPIRIN 81 MG PO CHEW: 324.0000 mg | CHEWABLE_TABLET | Freq: Every day | ORAL | 0 refills | Status: DC

## 2017-08-10 MED ORDER — STROKE: EARLY STAGES OF RECOVERY BOOK
Freq: Once | Status: AC
  Administered 2017-08-10: 02:00:00
  Filled 2017-08-10: qty 1

## 2017-08-10 MED ORDER — PRAVASTATIN SODIUM 80 MG PO TABS: 80.0000 mg | ORAL_TABLET | Freq: Every day | ORAL | 0 refills | Status: DC

## 2017-08-10 MED ORDER — ACETAMINOPHEN 325 MG PO TABS: 650.0000 mg | ORAL_TABLET | ORAL | Status: DC | PRN

## 2017-08-10 MED ORDER — VITAMIN B-12 1000 MCG PO TABS
1000.0000 ug | ORAL_TABLET | Freq: Every day | ORAL | Status: DC
  Administered 2017-08-10: 1000 ug via ORAL
  Filled 2017-08-10: qty 1

## 2017-08-10 MED ORDER — ZOLPIDEM TARTRATE 5 MG PO TABS: 5.0000 mg | ORAL_TABLET | Freq: Every evening | ORAL | Status: DC | PRN

## 2017-08-10 MED ORDER — ATORVASTATIN CALCIUM 80 MG PO TABS: 80.0000 mg | ORAL_TABLET | Freq: Every day | ORAL | Status: DC

## 2017-08-10 MED ORDER — SENNOSIDES-DOCUSATE SODIUM 8.6-50 MG PO TABS: 1.0000 | ORAL_TABLET | Freq: Every evening | ORAL | Status: DC | PRN

## 2017-08-10 MED ORDER — ONDANSETRON HCL 4 MG PO TABS: 8.0000 mg | ORAL_TABLET | Freq: Two times a day (BID) | ORAL | Status: DC | PRN

## 2017-08-10 MED ORDER — LIDOCAINE-PRILOCAINE 2.5-2.5 % EX CREA: 1.0000 "application " | TOPICAL_CREAM | CUTANEOUS | Status: DC | PRN

## 2017-08-10 MED ORDER — ASPIRIN 81 MG PO CHEW
324.0000 mg | CHEWABLE_TABLET | Freq: Every day | ORAL | Status: DC
  Administered 2017-08-10: 324 mg via ORAL
  Filled 2017-08-10: qty 4

## 2017-08-10 MED ORDER — PRAVASTATIN SODIUM 40 MG PO TABS
80.0000 mg | ORAL_TABLET | Freq: Every day | ORAL | Status: DC
  Administered 2017-08-10: 80 mg via ORAL
  Filled 2017-08-10: qty 2

## 2017-08-10 MED ORDER — HYDRALAZINE HCL 20 MG/ML IJ SOLN: 5.0000 mg | INTRAMUSCULAR | Status: DC | PRN

## 2017-08-10 NOTE — Care Management Note (Signed)
Case Management Note  Patient Details  Name: April [name] MRN: [id number] Date of Birth: 1950-12-14  Subjective/Objective:   Pt in with TIA. She is from home with spouse.                  Action/Plan: Pt is discharging home with self care. Pt has PCP, insurance and transportation home. CM signing off.   Expected Discharge Date:  08/10/17               Expected Discharge Plan:  Home/Self Care  In-House Referral:     Discharge planning Services     Post Acute Care Choice:    Choice offered to:     DME Arranged:    DME Agency:     HH Arranged:    HH Agency:     Status of Service:  Completed, signed off  If discussed at [employer] of Stay Meetings, dates discussed:    Additional Comments:  Pollie Friar, RN 08/10/2017, 4:18 PM

## 2017-08-10 NOTE — Progress Notes (Signed)
  Echocardiogram 2D Echocardiogram has been performed.  April [name] 08/10/2017, 3:16 PM

## 2017-08-10 NOTE — Discharge Instructions (Addendum)
Preventing Cerebrovascular Disease Arteries are blood vessels that carry blood that contains oxygen from the heart to all parts of the body. Cerebrovascular disease affects arteries that supply the brain. Any condition that blocks or disrupts blood flow to the brain can cause cerebrovascular disease. Brain cells that lose blood supply start to die within minutes (stroke). Stroke is the main danger of cerebrovascular disease. Atherosclerosis and high blood pressure are common causes of cerebrovascular disease. Atherosclerosis is narrowing and hardening of an artery that results when fat, cholesterol, calcium, or other substances (plaque) build up inside an artery. Plaque reduces blood flow through the artery. High blood pressure increases the risk of bleeding inside the brain. Making diet and lifestyle changes to prevent atherosclerosis and high blood pressure lowers your risk of cerebrovascular disease. What nutrition changes can be made?  Eat more fruits, vegetables, and whole grains.  Reduce how much saturated fat you eat. To do this, eat less red meat and fewer full-fat dairy products.  Eat healthy proteins instead of red meat. Healthy proteins include: ? Fish. Eat fish that contains heart-healthy omega-3 fatty acids, twice a week. Examples include salmon, albacore tuna, mackerel, and herring. ? Chicken. ? Nuts. ? Low-fat or nonfat yogurt.  Avoid processed meats, like bacon and lunchmeat.  Avoid foods that contain: ? A lot of sugar, such as sweets and drinks with added sugar. ? A lot of salt (sodium). Avoid adding extra salt to your food, as told by your health care provider. ? Trans fats, such as margarine and baked goods. Trans fats may be listed as "partially hydrogenated oils" on food labels.  Check food labels to see how much sodium, sugar, and trans fats are in foods.  Use vegetable oils that contain low amounts of saturated fat, such as olive oil or canola oil. What lifestyle  changes can be made?  Drink alcohol in moderation. This means no more than 1 drink a day for nonpregnant women and 2 drinks a day for men. One drink equals 12 oz of beer, 5 oz of wine, or 1 oz of hard liquor.  If you are overweight, ask your health care provider to recommend a weight-loss plan for you. Losing 5-10 lb (2.2-4.5 kg) can reduce your risk of diabetes, atherosclerosis, and high blood pressure.  Exercise for 30?60 minutes on most days, or as much as told by your health care provider. ? Do moderate-intensity exercise, such as brisk walking, bicycling, and water aerobics. Ask your health care provider which activities are safe for you.  Do not use any products that contain nicotine or tobacco, such as cigarettes and e-cigarettes. If you need help quitting, ask your health care provider. Why are these changes important? Making these changes lowers your risk of many diseases that can cause cerebrovascular disease and stroke. Stroke is a leading cause of death and disability. Making these changes also improves your overall health and quality of life. What can I do to lower my risk? The following factors make you more likely to develop cerebrovascular disease:  Being overweight.  Smoking.  Being physically inactive.  Eating a high-fat diet.  Having certain health conditions, such as: ? Diabetes. ? High blood pressure. ? Heart disease. ? Atherosclerosis. ? High cholesterol. ? Sickle cell disease.  Talk with your health care provider about your risk for cerebrovascular disease. Work with your health care provider to control diseases that you have that may contribute to cerebrovascular disease. Your health care provider may prescribe medicines to help prevent   major causes of cerebrovascular disease. Where to find more information: Learn more about preventing cerebrovascular disease from:  [place] [place]:  [employer]  Centers for Disease Control and Prevention: http://www.curry-wood.biz/  Summary  Cerebrovascular disease can lead to a stroke.  Atherosclerosis and high blood pressure are major causes of cerebrovascular disease.  Making diet and lifestyle changes can reduce your risk of cerebrovascular disease.  Work with your health care provider to get your risk factors under control to reduce your risk of cerebrovascular disease. This information is not intended to replace advice given to you by your health care provider. Make sure you discuss any questions you have with your health care provider. Document Released: 06/20/2015 Document Revised: 12/24/2015 Document Reviewed: 06/20/2015 Elsevier Interactive Patient Education  2018 [place].   Transient Ischemic Attack A transient ischemic attack (TIA) is a "warning stroke" that causes stroke-like symptoms. A TIA does not cause lasting damage to the brain. The symptoms of a TIA can happen fast and do not last long. It is important to know the symptoms of a TIA and what to do. This can help prevent stroke or death. Follow these instructions at home:  Take medicines only as told by your doctor. Make sure you understand all of the instructions.  You may need to take aspirin or warfarin medicine. Warfarin needs to be taken exactly as told. ? Taking too much or too little warfarin is dangerous. Blood tests must be done as often as told by your doctor. A PT blood test measures how long it takes for blood to clot. Your PT is used to calculate another value called an INR. Your PT and INR help your doctor adjust your warfarin dosage. He or she will make sure you are taking the right amount. ? Food can cause problems with warfarin and affect the results of your blood tests. This is true for foods high in vitamin K. Eat the same amount of foods high in vitamin K each day. Foods high in vitamin K include  spinach, kale, broccoli, cabbage, collard and turnip greens, Brussels sprouts, peas, cauliflower, seaweed, and parsley. Other foods high in vitamin K include beef and pork liver, green tea, and soybean oil. Eat the same amount of foods high in vitamin K each day. Avoid big changes in your diet. Tell your doctor before changing your diet. Talk to a food specialist (dietitian) if you have questions. ? Many medicines can cause problems with warfarin and affect your PT and INR. Tell your doctor about all medicines you take. This includes vitamins and dietary pills (supplements). Do not take or stop taking any prescribed or over-the-counter medicines unless your doctor tells you to. ? Warfarin can cause more bruising or bleeding. Hold pressure over any cuts for longer than normal. Talk to your doctor about other side effects of warfarin. ? Avoid sports or activities that may cause injury or bleeding. ? Be careful when you shave, floss, or use sharp objects. ? Avoid or drink very little alcohol while taking warfarin. Tell your doctor if you change how much alcohol you drink. ? Tell your dentist and other doctors that you take warfarin before any procedures.  Follow your diet program as told, if you are given one.  Keep a healthy weight.  Stay active. Try to get at least 30 minutes of activity on all or most days.  Do not use any tobacco products, including cigarettes, chewing tobacco, or electronic cigarettes. If you need help quitting, ask  your doctor.  Limit alcohol intake to no more than 1 drink per day for nonpregnant women and 2 drinks per day for men. One drink equals 12 ounces of beer, 5 ounces of wine, or 1 ounces of hard liquor.  Do not abuse drugs.  Keep your home safe so you do not fall. You can do this by: ? Putting grab bars in the bedroom and bathroom. ? Raising toilet seats. ? Putting a seat in the shower.  Keep all follow-up visits as told by your doctor. This is  important. Contact a doctor if:  Your personality changes.  You have trouble swallowing.  You have double vision.  You are dizzy.  You have a fever. Get help right away if: These symptoms may be an emergency. Do not wait to see if the symptoms will go away. Get medical help right away. Call your local emergency services (911 in the [place] Do not drive yourself to the hospital.  You have sudden weakness or lose feeling (go numb), especially on one side of the body. This can affect your: ? Face. ? Arm. ? Leg.  You have sudden trouble walking.  You have sudden trouble moving your arms or legs.  You have sudden confusion.  You have trouble talking.  You have trouble understanding.  You have sudden trouble seeing in one or both eyes.  You lose your balance.  Your movements are not smooth.  You have a sudden, very bad headache with no known cause.  You have new chest pain.  Your heartbeat is unsteady.  You are partly or totally unaware of what is going on around you.  This information is not intended to replace advice given to you by your health care provider. Make sure you discuss any questions you have with your health care provider. Document Released: 03/14/2008 Document Revised: 02/07/2016 Document Reviewed: 09/10/2013 Elsevier Interactive Patient Education  2018 [employer].   Transient Ischemic Attack A transient ischemic attack (TIA) is a "warning stroke" that causes stroke-like symptoms. A TIA does not cause lasting damage to the brain. The symptoms of a TIA can happen fast and do not last long. It is important to know the symptoms of a TIA and what to do. This can help prevent stroke or death. Follow these instructions at home:  Take medicines only as told by your doctor. Make sure you understand all of the instructions.  You may need to take aspirin or warfarin medicine. Warfarin needs to be taken exactly as told. ? Taking too much or too little warfarin  is dangerous. Blood tests must be done as often as told by your doctor. A PT blood test measures how long it takes for blood to clot. Your PT is used to calculate another value called an INR. Your PT and INR help your doctor adjust your warfarin dosage. He or she will make sure you are taking the right amount. ? Food can cause problems with warfarin and affect the results of your blood tests. This is true for foods high in vitamin K. Eat the same amount of foods high in vitamin K each day. Foods high in vitamin K include spinach, kale, broccoli, cabbage, collard and turnip greens, Brussels sprouts, peas, cauliflower, seaweed, and parsley. Other foods high in vitamin K include beef and pork liver, green tea, and soybean oil. Eat the same amount of foods high in vitamin K each day. Avoid big changes in your diet. Tell your doctor before changing your diet. Talk  to a food specialist (dietitian) if you have questions. ? Many medicines can cause problems with warfarin and affect your PT and INR. Tell your doctor about all medicines you take. This includes vitamins and dietary pills (supplements). Do not take or stop taking any prescribed or over-the-counter medicines unless your doctor tells you to. ? Warfarin can cause more bruising or bleeding. Hold pressure over any cuts for longer than normal. Talk to your doctor about other side effects of warfarin. ? Avoid sports or activities that may cause injury or bleeding. ? Be careful when you shave, floss, or use sharp objects. ? Avoid or drink very little alcohol while taking warfarin. Tell your doctor if you change how much alcohol you drink. ? Tell your dentist and other doctors that you take warfarin before any procedures.  Follow your diet program as told, if you are given one.  Keep a healthy weight.  Stay active. Try to get at least 30 minutes of activity on all or most days.  Do not use any tobacco products, including cigarettes, chewing tobacco, or  electronic cigarettes. If you need help quitting, ask your doctor.  Limit alcohol intake to no more than 1 drink per day for nonpregnant women and 2 drinks per day for men. One drink equals 12 ounces of beer, 5 ounces of wine, or 1 ounces of hard liquor.  Do not abuse drugs.  Keep your home safe so you do not fall. You can do this by: ? Putting grab bars in the bedroom and bathroom. ? Raising toilet seats. ? Putting a seat in the shower.  Keep all follow-up visits as told by your doctor. This is important. Contact a doctor if:  Your personality changes.  You have trouble swallowing.  You have double vision.  You are dizzy.  You have a fever. Get help right away if: These symptoms may be an emergency. Do not wait to see if the symptoms will go away. Get medical help right away. Call your local emergency services (911 in the [place] Do not drive yourself to the hospital.  You have sudden weakness or lose feeling (go numb), especially on one side of the body. This can affect your: ? Face. ? Arm. ? Leg.  You have sudden trouble walking.  You have sudden trouble moving your arms or legs.  You have sudden confusion.  You have trouble talking.  You have trouble understanding.  You have sudden trouble seeing in one or both eyes.  You lose your balance.  Your movements are not smooth.  You have a sudden, very bad headache with no known cause.  You have new chest pain.  Your heartbeat is unsteady.  You are partly or totally unaware of what is going on around you.  This information is not intended to replace advice given to you by your health care provider. Make sure you discuss any questions you have with your health care provider. Document Released: 03/14/2008 Document Revised: 02/07/2016 Document Reviewed: 09/10/2013 Elsevier Interactive Patient Education  2018 [employer].   Stroke Prevention Some health problems and behaviors may make it more likely for you to  have a stroke. Below are ways to lessen your risk of having a stroke.  Be active for at least 30 minutes on most or all days.  Do not smoke. Try not to be around others who smoke.  Do not drink too much alcohol. ? Do not have more than 2 drinks a day if you are a  man. ? Do not have more than 1 drink a day if you are a woman and are not pregnant.  Eat healthy foods, such as fruits and vegetables. If you were put on a specific diet, follow the diet as told.  Keep your cholesterol levels under control through diet and medicines. Look for foods that are low in saturated fat, trans fat, cholesterol, and are high in fiber.  If you have diabetes, follow all diet plans and take your medicine as told.  Ask your doctor if you need treatment to lower your blood pressure. If you have high blood pressure (hypertension), follow all diet plans and take your medicine as told by your doctor.  If you are 54-81 years old, have your blood pressure checked every 3-5 years. If you are age 79 or older, have your blood pressure checked every year.  Keep a healthy weight. Eat foods that are low in calories, salt, saturated fat, trans fat, and cholesterol.  Do not take drugs.  Avoid birth control pills, if this applies. Talk to your doctor about the risks of taking birth control pills.  Talk to your doctor if you have sleep problems (sleep apnea).  Take all medicine as told by your doctor. ? You may be told to take aspirin or blood thinner medicine. Take this medicine as told by your doctor. ? Understand your medicine instructions.  Make sure any other conditions you have are being taken care of.  Get help right away if:  You suddenly lose feeling (you feel numb) or have weakness in your face, arm, or leg.  Your face or eyelid hangs down to one side.  You suddenly feel confused.  You have trouble talking (aphasia) or understanding what people are saying.  You suddenly have trouble seeing in one or  both eyes.  You suddenly have trouble walking.  You are dizzy.  You lose your balance or your movements are clumsy (uncoordinated).  You suddenly have a very bad headache and you do not know the cause.  You have new chest pain.  Your heart feels like it is fluttering or skipping a beat (irregular heartbeat). Do not wait to see if the symptoms above go away. Get help right away. Call your local emergency services (911 in [place] Do not drive yourself to the hospital. This information is not intended to replace advice given to you by your health care provider. Make sure you discuss any questions you have with your health care provider. Document Released: 12/05/2011 Document Revised: 11/11/2015 Document Reviewed: 12/06/2012 Elsevier Interactive Patient Education  [employer].

## 2017-08-10 NOTE — Progress Notes (Addendum)
NEUROHOSPITALISTS STROKE TEAM - DAILY PROGRESS NOTE   ADMISSION HISTORY: April [name] is a 67 y.o. female with a history of grade 3 invasive ductal carcinoma, stage IA. She was in her normal state of health until tonight around 8:00 pm when she began slurring her words.  This is been coming and going since it began, sometimes being almost completely gone and sometimes being very prominent.  She has always had at least a mild left facial droop since it began, however. She was started chemotherapy with Paclitaxel and Trastuzumab on Tuesday. Patient was found to have elevated blood pressure 232/83 on admission  LKW: 8:00 PM tpa given?: no, mild symptoms Premorbid modified rankin scale: 0  SUBJECTIVE (INTERVAL HISTORY) Family is at the bedside. Patient is found laying in bed in NAD. Overall she feels her condition is rapidly improving. Voices no new complaints. No new events reported overnight. Patient states she started chemotherapy approximately 2 days prior to her symptoms. She felt her tongue was thick and her family felt she had a left facial droop. She feels close to her baseline this AM.She was started chemotherapy with Paclitaxel and Trastuzumab on Tuesday. Patient was found to have elevated blood pressure 232/83 on admission     OBJECTIVE Lab Results: CBC:  Recent Labs  Lab 08/07/17 0932 08/09/17 2143 08/09/17 2155  WBC 3.7* 6.3  --   HGB 10.8* 11.4* 12.6  HCT 33.1* 35.1* 37.0  MCV 88.2 88.4  --   PLT 260 270  --    BMP: Recent Labs  Lab 08/07/17 0932 08/09/17 2143 08/09/17 2155  NA 140 138 142  K 3.7 3.5 4.1  CL 107 105 106  CO2 23 22  --   GLUCOSE 108 106* 103*  BUN 11 11 16   CREATININE 0.89 0.84 0.80  CALCIUM 9.6 9.2  --    Liver Function Tests:  Recent Labs  Lab 08/07/17 0932 08/09/17 2143  AST 18 32  ALT 13 24  ALKPHOS 80 71  BILITOT 0.3 0.6  PROT 7.3 7.5  ALBUMIN 3.9 4.3   Coagulation  Studies:  Recent Labs    08/09/17 2143  APTT 29  INR 1.06   Urinalysis:  Recent Labs  Lab 08/09/17 2224  COLORURINE COLORLESS*  APPEARANCEUR CLEAR  LABSPEC 1.004*  PHURINE 6.0  GLUCOSEU NEGATIVE  HGBUR NEGATIVE  BILIRUBINUR NEGATIVE  KETONESUR NEGATIVE  PROTEINUR NEGATIVE  NITRITE NEGATIVE  LEUKOCYTESUR NEGATIVE   Urine Drug Screen:     Component Value Date/Time   LABOPIA NONE DETECTED 08/09/2017 2224   COCAINSCRNUR NONE DETECTED 08/09/2017 2224   LABBENZ NONE DETECTED 08/09/2017 2224   AMPHETMU NONE DETECTED 08/09/2017 2224   THCU NONE DETECTED 08/09/2017 2224   LABBARB NONE DETECTED 08/09/2017 2224    Alcohol Level:  Recent Labs  Lab 08/10/17 0217  ETH <10    PHYSICAL EXAM Temp:  [98 F (36.7 C)-98.6 F (37 C)] 98.6 F (37 C) (02/22 0613) Pulse Rate:  [64-93] 78 (02/22 0613) Resp:  [15-21] 16 (02/22 0613) BP: (144-232)/(57-116) 144/68 (02/22 0613) SpO2:  [97 %-100 %] 98 % (02/22 0613) Weight:  [69.8 kg (153 lb 14.1 oz)-69.9 kg (154 lb 1.6 oz)] 69.9 kg (154 lb 1.6 oz) (02/22 0213) General - Well nourished, well developed, in no apparent distress HEENT-  Normocephalic,   Cardiovascular - Regular rate and rhythm  Respiratory - Lungs clear bilaterally. No wheezing. Abdomen - soft and non-tender, BS normal Extremities- no edema or cyanosis Mental Status: Patient is  awake, alert, oriented to person, place, month, year, and situation. Patient is able to give a clear and coherent history. No signs of aphasia or neglect Cranial Nerves: II: Visual Fields are full. Pupils are equal, round, and reactive to light.   III,IV, VI: EOMI without ptosis or diploplia.  V: Facial sensation is symmetric to temperature VII: Facial movement  normal VIII: hearing is intact to voice X: Uvula elevates symmetrically XI: Shoulder shrug is symmetric. XII: tongue is midline without atrophy or fasciculations.  Motor: Tone is normal. Bulk is normal. 5/5 strength was present in  all four extremities.  Sensory: Sensation is symmetric to light touch and temperature in the arms and legs. Cerebellar: FNF and HKS are intact bilaterally  IMAGING: I have personally reviewed the radiological images below and agree with the radiology interpretations. Mr Jeri Cos And Wo Contrast Result Date: 08/09/2017  IMPRESSION: Negative MRI of the head with and without contrast for age. Electronically Signed   By: Elon Alas M.D.   On: 08/09/2017 23:58   Ct Head Code Stroke Wo Contrast Result Date: 08/09/2017 IMPRESSION: 1. No acute intracranial abnormality identified. 2. Mild chronic microvascular ischemic changes of the brain. 3. ASPECTS is 10 These results were communicated to Dr. Leonel Ramsay at 10:06 pmon 2/21/2019by text page via the [facility] messaging system. Electronically Signed   By: Kristine Garbe M.D.   On: 08/09/2017 22:07   Echocardiogram:                                              PENDING B/L Carotid U/S:                                                PENDING  MRA Brain:                                                 FINDINGS: Right dominant vertebral artery. Duplicated right PICA. Large bilateral AICA. There is a fetal type left PCA. Symmetric carotid arteries. There is mild hypoplasia of the left A1 segment. No branch occlusion, beading, aneurysm, or atheromatous type irregularity. IMPRESSION: Negative intracranial MRA.     IMPRESSION: [name] is a 67 y.o. female with PMH of history of grade 3 invasive ductal carcinoma, stage IA, who presents with acute onset left facial droop and slurred speech. MRI/MRA negative for acute findings.  Likely stuttering TIA versus side effects from chemotherapy drugs for hypertensive urgency  Suspected Etiology: small vessel disease vs embolic from active cancer Resultant Symptoms:  left facial droop and slurred speech. Stroke Risk Factors: diabetes mellitus, hyperlipidemia and hypertension Other  Stroke Risk Factors: Advanced age,  Breast Cancer  Outstanding Stroke Work-up Studies:     Echocardiogram:                                                    PENDING B/L Carotid U/S:  PENDING  PLAN  08/10/2017: Continue Aspirin/ Statin Frequent neuro checks Telemetry monitoring PT/OT/SLP Ongoing aggressive stroke risk factor management Patient counseled to be compliant with her antithrombotic medications Patient counseled on Lifestyle modifications including, Diet, Exercise, and Stress Follow up with [facility] in 6 weeks  MEDICAL ISSUES: grade 3 invasive ductal carcinoma, stage IA,  no cranial metastasis seen on MRI imaging  HYPERTENSION: Stable, some elevated B/P's noted overnight Long term BP goal normotensive. May slowly start B/P medications, if needed Home Meds: NONE  HYPERLIPIDEMIA:    Component Value Date/Time   CHOL 273 (H) 08/10/2017 0217   TRIG 132 08/10/2017 0217   HDL 84 08/10/2017 0217   CHOLHDL 3.3 08/10/2017 0217   VLDL 26 08/10/2017 0217   LDLCALC 163 (H) 08/10/2017 0217  Home Meds:  NONE LDL  goal < 70 Started on Pravachol to 80 mg daily Continue statin at discharge Patient may be statin intolerant. States Lipitor and Crestor give her leg cramps Will discuss at outpatient Neurology appointment  PRE- DIABETES: Lab Results  Component Value Date   HGBA1C 5.9 (H) 08/10/2017  HgbA1c goal < 7.0 Continue CBG monitoring and SSI to maintain glucose 140-180 mg/dl DM education   Other Active Problems: Principal Problem:   TIA (transient ischemic attack) Active Problems:   Hypertension   Breast cancer [facility])    Hospital day # 0 VTE prophylaxis: Lovenox  Diet : Diet Heart Room service appropriate? Yes; Fluid consistency: Thin   FAMILY UPDATES:  family at bedside  TEAM UPDATES: Kayleen Memos, DO   Prior Home Stroke Medications:  No antithrombotic  Discharge Stroke Meds:  Please  discharge patient on aspirin 325 mg daily   Disposition: 01-Home or Self Care Therapy Recs:               PENDING Follow Up:  Follow-up Information    Garvin Fila, MD. Schedule an appointment as soon as possible for a visit in 6 week(s).   Specialties:  Neurology, Radiology Contact information: [address] [place] [address] [phone]          Kelton Pillar, MD -PCP Follow up in 1-2 weeks      Assessment & plan discussed with with attending physician and they are in agreement.    Mary Sella, ANP-C Stroke Neurology Team 08/10/2017 7:49 AM   08/10/2017 ATTENDING ASSESSMENT:  She presented with transient speech difficulties and facial droop in the setting of significantly elevated blood pressure and brain imaging is negative for stroke. This could represent hypertensive urgency versus side effects of recently started chemotherapy. Continue ongoing stroke workup and aspirin. Aggressive risk factor modification. Greater than 50% time during this 35 minute visit was spent on counseling and coordination of care about her slurred speech, TIA and discussion about risk factor prevention and answering questions  Antony Contras, MD Medical Director [facility] Pager: [phone] 08/10/2017 2:32 PM  To contact Stroke Continuity provider, please refer to [email or web address] After hours, contact General Neurology

## 2017-08-10 NOTE — Progress Notes (Signed)
Admitted to 3W08, denies pain, gait steady, very minimal left droop, no N/T, speech clear, denies dizziness.  Oriented to room, plan of care, safety precautions.  Spouse at bedside.

## 2017-08-10 NOTE — Progress Notes (Signed)
Preliminary notes by tech- duplex carotid ultrasound exam completed, no evidence of significant stenosis.  Hongying Kodah Maret(RDMS, RVT)

## 2017-08-10 NOTE — Progress Notes (Signed)
  Speech Language Pathology  Patient Details Name: April [name] MRN: [id number] DOB: August 27, 1950 Today's Date: 08/10/2017 Time:  -      Spoke with pt re: cognitive-speech-language abilities. Pt noted confusion with how to organize bills which prompted her to seek medical help. She feels she is back to baseline now. SLP did not determine need for full assessment.  Educated pt to notify PCP for ST referral if impairments arise when home.               Houston Siren 08/10/2017, 9:48 AM   Orbie Pyo Colvin Caroli.Ed [employer] [phone]

## 2017-08-10 NOTE — Evaluation (Signed)
Physical Therapy Evaluation Patient Details Name: April [name] MRN: [id number] DOB: Oct 14, 1950 Today's Date: 08/10/2017   History of Present Illness  Patient is a 67 y/o female who presents with swollen tongue, slurred speech and facial droop. Head CT and MRI-unremarkable. Admitted for TIA vs migraine. PMH includes HTN and breast ca and recently started chemo this week.   Clinical Impression  Patient presents with elevated BP and tachycardia s/p above. Bp 189/94 and HR ranged from 80-129 bpm during mobility. Tolerated gait training and stair training with mod I. Scored 23/24 on DGI indicating pt is not increased risk for falls. Able to perform higher level balance activities without difficulty or LOB. Education re: signs/symptoms of stroke and Be[employer]. Pt independent PTA and recently retired. Just started chemo for breast ca this past week. Does not require skilled therapy services as pt functioning close to baseline. All education completed. Discharge from therapy.    Follow Up Recommendations No PT follow up    Equipment Recommendations  None recommended by PT    Recommendations for Other Services       Precautions / Restrictions Precautions Precautions: Other (comment) Precaution Comments: elevated BP; HR Restrictions Weight Bearing Restrictions: No      Mobility  Bed Mobility Overal bed mobility: Modified Independent             General bed mobility comments: No assist needed. No dizziness.   Transfers Overall transfer level: Modified independent Equipment used: None             General transfer comment: Stood from EOB x1 without assist or difficulty.   Ambulation/Gait Ambulation/Gait assistance: Modified independent (Device/Increase time) Ambulation Distance (Feet): 300 Feet Assistive device: None Gait Pattern/deviations: WFL(Within Functional Limits)   Gait velocity interpretation: at or above normal speed for age/gender General Gait Details:  Tolerated gait training with higher level balance challenges without difficulty. See balance section for details.   Stairs Stairs: Yes Stairs assistance: Modified independent (Device/Increase time) Stair Management: One rail Right;Alternating pattern;Step to pattern Number of Stairs: 13    Wheelchair Mobility    Modified Rankin (Stroke Patients Only) Modified Rankin (Stroke Patients Only) Pre-Morbid Rankin Score: No symptoms Modified Rankin: No symptoms     Balance Overall balance assessment: Needs assistance Sitting-balance support: Feet supported;No upper extremity supported Sitting balance-Leahy Scale: Normal     Standing balance support: During functional activity Standing balance-Leahy Scale: Normal                   Standardized Balance Assessment Standardized Balance Assessment : Dynamic Gait Index   Dynamic Gait Index Level Surface: Normal Change in Gait Speed: Normal Gait with Horizontal Head Turns: Normal Gait with Vertical Head Turns: Normal Gait and Pivot Turn: Normal Step Over Obstacle: Normal Step Around Obstacles: Normal Steps: Mild Impairment Total Score: 23       Pertinent Vitals/Pain Pain Assessment: Faces Faces Pain Scale: Hurts a little bit Pain Location: headache Pain Descriptors / Indicators: Headache Pain Intervention(s): Monitored during session    Home Living Family/patient expects to be discharged to:: Private residence Living Arrangements: Spouse/significant other Available Help at Discharge: Family;Available PRN/intermittently   Home Access: Stairs to enter Entrance Stairs-Rails: Right Entrance Stairs-Number of Steps: 8 Home Layout: Two level;Bed/bath upstairs Home Equipment: None      Prior Function Level of Independence: Independent         Comments: Recently retired. Drives.      Hand Dominance        Extremity/Trunk  Assessment   Upper Extremity Assessment Upper Extremity Assessment: Defer to OT  evaluation;Overall [facility] for tasks assessed    Lower Extremity Assessment Lower Extremity Assessment: Overall WFL for tasks assessed    Cervical / Trunk Assessment Cervical / Trunk Assessment: Normal  Communication   Communication: No difficulties  Cognition Arousal/Alertness: Awake/alert Behavior During Therapy: WFL for tasks assessed/performed Overall Cognitive Status: Within Functional Limits for tasks assessed                                 General Comments: for basic mobility tasks      General Comments      Exercises     Assessment/Plan    PT Assessment Patent does not need any further PT services  PT Problem List         PT Treatment Interventions      PT Goals (Current goals can be found in the Care Plan section)  Acute Rehab PT Goals Patient Stated Goal: to go home PT Goal Formulation: All assessment and education complete, DC therapy    Frequency     Barriers to discharge        Co-evaluation               AM-PAC PT "6 Clicks" Daily Activity  Outcome Measure Difficulty turning over in bed (including adjusting bedclothes, sheets and blankets)?: None Difficulty moving from lying on back to sitting on the side of the bed? : None Difficulty sitting down on and standing up from a chair with arms (e.g., wheelchair, bedside commode, etc,.)?: None Help needed moving to and from a bed to chair (including a wheelchair)?: None Help needed walking in hospital room?: None Help needed climbing 3-5 steps with a railing? : A Little 6 Click Score: 23    End of Session   Activity Tolerance: Patient tolerated treatment well;Treatment limited secondary to medical complications (Comment)(elevated BP) Patient left: in bed;with call bell/phone within reach;with family/visitor present(sitting EOB.) Nurse Communication: Mobility status PT Visit Diagnosis: Difficulty in walking, not elsewhere classified (R26.2)    Time: 7121-9758 PT Time  Calculation (min) (ACUTE ONLY): 16 min   Charges:   PT Evaluation $PT Eval Low Complexity: 1 Low     PT G CodesWray Kearns, PT, DPT [phone]    Marguarite Arbour A Merita Hawks 08/10/2017, 8:43 AM

## 2017-08-10 NOTE — Progress Notes (Signed)
OT Cancellation Note  Patient Details Name: ZAHLIA DESHAZER MRN: [id number] DOB: 07-02-1950   Cancelled Treatment:    Reason Eval/Treat Not Completed: OT screened, no needs identified, will sign off  Malka So 08/10/2017, 9:16 AM  08/10/2017 Nestor Lewandowsky, OTR/L Pager: [phone]

## 2017-08-10 NOTE — ED Notes (Signed)
Attempted to call report

## 2017-08-10 NOTE — Discharge Summary (Signed)
Discharge Summary  April [name] TKP:[id number] DOB: [birth year]  PCP: Kelton Pillar, MD  Admit date: 08/09/2017 Discharge date: 08/10/2017  Time spent: 25 minutes  Recommendations for Outpatient Follow-up:  1. Follow up with neurology in 6 weeks, Dr. Leonie Man.  2. Follow up with your PCP 3. Follow up with your oncology team  Discharge Diagnoses:  Active Hospital Problems   Diagnosis Date Noted  . TIA (transient ischemic attack) 08/09/2017  . Hypertension   . Breast cancer ([place]) 05/19/2017    Resolved Hospital Problems  No resolved problems to display.    Discharge Condition: stable  Diet recommendation: resume previous diet  Vitals:   08/10/17 1241 08/10/17 1531  BP: (!) 184/91 (!) 142/68  Pulse: 78 72  Resp: 18 18  Temp: 98.7 F (37.1 C) 98.7 F (37.1 C)  SpO2: 99% 100%    History of present illness:  SHALAYNA [name] is a 67 y.o. female with medical history significant of hypertension, breast cancer who presents with difficulty speaking and left facial droop. Difficulty speaking and slurred speech at about 7:30 PM with mild left facial droop. States that she felt her tongue was thicker. She denies unilateral weakness, numbness or tingliness in extremities. No vision change or hearing loss. Her symptoms have resolved at arrival to the ED. Patient denies chest pain, shortness breath, cough, fever or chills. No nausea, vomiting or abdominal pain. She states that she had 3 loose stool bowel movement which she contributes to ongoing chemotherapy. Patient has history of grade 3 invasive ductal carcinoma, stage IA. She is s/p of R lumpectomy with radioactive seed placement. She was started chemotherapy on Tuesday 08/07/17. Patient was found to have elevated blood pressure 232/83.   Suspected TIA. Negative intracranial MRA. 2D echo pending.  On the day of discharge the patient was hemodynamically stable. She will need to follow up with neurology, Dr. Leonie Man in 6 weeks,  her PCP, and oncologist post hospitalization.    Hospital Course:  Principal Problem:   TIA (transient ischemic attack) Active Problems:   Hypertension   Breast cancer ([facility])  TIA (transient ischemic attack): Etiology is not clear. CT head is negative. MRI of brain with and without contrast negative. Neurology was consulted. Differential diagnoses include migraine versus TIA, Dr. Leonel Ramsay recommended TIA workup. - tele bed - Appreciate Dr. Leonel Ramsay consultation, the follow-up recommendations - Risk factor modification: HgbA1c 5.9, fasting lipid panel 163 - MRA of the brain without contrast negative for acute fuindings - PT consult, OT consult - Bedside swallowing screen was ordered-passed - 2 d Echocardiogram pending  - Carotid dopplers  - Aspirin and statin   Hypertension: bp is 232/83-->152/57. Not on med at home. -will allow permissive hypertension -When necessary hydralazine for SBP>220 -BP 142/68 prior to discharge  Hyperlipidemia, uncontrolled -ldl 163  -statin -f/u with PCP  Breast cancer [facility]): f/u with Dr. Jana Hakim.  Patient has history of grade 3 invasive ductal carcinoma, stage IA. She is s/p of R lumpectomy with radioactive seed placement. She was started chemotherapy on Tuesday. -f/u with Dr. Jana Hakim  Procedures:  none   Consultations:  Neurology  Stroke team   Discharge Exam: BP (!) 142/68 (BP Location: Left Arm)   Pulse 72   Temp 98.7 F (37.1 C) (Oral)   Resp 18   Ht 5' 3.5" (1.613 m)   Wt 69.9 kg (154 lb 1.6 oz)   SpO2 100%   BMI 26.87 kg/m   General: 67 yo AAF WD WN NAD A&O x  3  Cardiovascular: RRR no rubs or gallops  Respiratory: CTA no wheezes or rales  Discharge Instructions You were cared for by a hospitalist during your hospital stay. If you have any questions about your discharge medications or the care you received while you were in the hospital after you are discharged, you can call the unit and asked to speak with the  hospitalist on call if the hospitalist that took care of you is not available. Once you are discharged, your primary care physician will handle any further medical issues. Please note that NO REFILLS for any discharge medications will be authorized once you are discharged, as it is imperative that you return to your primary care physician (or establish a relationship with a primary care physician if you do not have one) for your aftercare needs so that they can reassess your need for medications and monitor your lab values.  Discharge Instructions    Ambulatory referral to Neurology   Complete by:  As directed    An appointment is requested in approximately: 6 weeks Follow up with stroke clinic (Dr Leonie Man preferred, if not available, then consider Caesar Chestnut, [facility] or Jaynee Eagles whoever is available) at [facility] in about 6-8 weeks. Thanks.     Allergies as of 08/10/2017      Reactions   Tape Itching, Rash   Nsaids    Sulfa Antibiotics Rash      Medication List    TAKE these medications   aspirin 81 MG chewable tablet Chew 4 tablets (324 mg total) by mouth daily. Start taking on:  08/11/2017   CALCIUM-VITAMIN D PO Take 1 tablet by mouth daily.   lidocaine-prilocaine cream Commonly known as:  EMLA Apply to affected area once What changed:    how much to take  how to take this  when to take this  reasons to take this  additional instructions   LORazepam 0.5 MG tablet Commonly known as:  ATIVAN Take 1 tablet (0.5 mg total) by mouth every 6 (six) hours as needed (Nausea or vomiting).   ondansetron 8 MG tablet Commonly known as:  ZOFRAN Take 1 tablet (8 mg total) by mouth 2 (two) times daily as needed (Nausea or vomiting).   oxyCODONE 5 MG immediate release tablet Commonly known as:  Oxy IR/ROXICODONE Take 1 tablet (5 mg total) by mouth every 6 (six) hours as needed for moderate pain, severe pain or breakthrough pain.   pravastatin 80 MG tablet Commonly known as:   PRAVACHOL Take 1 tablet (80 mg total) by mouth daily at 6 PM.   prochlorperazine 10 MG tablet Commonly known as:  COMPAZINE Take 1 tablet (10 mg total) by mouth every 6 (six) hours as needed (Nausea or vomiting).   vitamin B-12 1000 MCG tablet Commonly known as:  CYANOCOBALAMIN Take 1,000 mcg by mouth daily.      Allergies  Allergen Reactions  . Tape Itching and Rash  . Nsaids   . Sulfa Antibiotics Rash   Follow-up Information    Garvin Fila, MD. Schedule an appointment as soon as possible for a visit in 6 week(s).   Specialties:  Neurology, Radiology Contact information: [address] [place] [address] [phone]        Kelton Pillar, MD Follow up.   Specialty:  Family Medicine Contact information: [address] [place] [address] [phone]            The results of significant diagnostics from this hospitalization (  including imaging, microbiology, ancillary and laboratory) are listed below for reference.    Significant Diagnostic Studies: Mr Jeri Cos And Wo Contrast  Result Date: 08/09/2017 CLINICAL DATA:  Slurred speech. Suspect stroke. History of breast cancer. EXAM: MRI HEAD WITHOUT AND WITH CONTRAST TECHNIQUE: Multiplanar, multiecho pulse sequences of the brain and surrounding structures were obtained without and with intravenous contrast. CONTRAST:  57mL MULTIHANCE GADOBENATE DIMEGLUMINE 529 MG/ML IV SOLN COMPARISON:  CT HEAD August 09, 2017 FINDINGS: INTRACRANIAL CONTENTS: No reduced diffusion to suggest acute ischemia, hyperacute demyelination or hypercellular tumor. No susceptibility artifact to suggest hemorrhage. The ventricles and sulci are normal for patient's age. Patchy supratentorial white matter FLAIR T2 hyperintensities compatible with mild chronic small vessel ischemic disease, normal for age. No suspicious parenchymal signal, masses, mass effect. No abnormal intraparenchymal or extra-axial enhancement.  No abnormal extra-axial fluid collections. No extra-axial masses. VASCULAR: Normal major intracranial vascular flow voids present at skull base. SKULL AND UPPER CERVICAL SPINE: No abnormal sellar expansion. No suspicious calvarial bone marrow signal. Craniocervical junction maintained. SINUSES/ORBITS: The mastoid air-cells and included paranasal sinuses are well-aerated.The included ocular globes and orbital contents are non-suspicious. OTHER: None. IMPRESSION: Negative MRI of the head with and without contrast for age. Electronically Signed   By: Elon Alas M.D.   On: 08/09/2017 23:58   Nm Sentinel Node Inj-no Rpt (breast)  Result Date: 07/17/2017 Sulfur colloid was injected by the nuclear medicine technologist for melanoma sentinel node.   Dg Chest Port 1 View  Result Date: 07/17/2017 CLINICAL DATA:  Placement of Port-A-Cath. EXAM: PORTABLE CHEST 1 VIEW COMPARISON:  No recent. FINDINGS: Port-A-Cath noted with tip over superior vena cava. Heart size normal. No focal infiltrate. No pleural effusion. No pneumothorax. Surgical clips noted over the right chest. Soft tissue air noted over the right chest. IMPRESSION: 1. Port-A-Cath noted with tip over the superior vena cava. 2. With right chest wall soft tissue air right chest wall. Electronically Signed   By: Marcello Moores  Register   On: 07/17/2017 09:42   Dg Fluoro Guide Cv Line-no Report  Result Date: 07/17/2017 Fluoroscopy was utilized by the requesting physician.  No radiographic interpretation.   Mr Jodene Nam Head Wo Contrast  Result Date: 08/10/2017 CLINICAL DATA:  Facial droop/dysarthria.  TIA. EXAM: MRA HEAD WITHOUT CONTRAST TECHNIQUE: Angiographic images of the Circle of Willis were obtained using MRA technique without intravenous contrast. COMPARISON:  None. FINDINGS: Right dominant vertebral artery. Duplicated right PICA. Large bilateral AICA. There is a fetal type left PCA. Symmetric carotid arteries. There is mild hypoplasia of the left A1  segment. No branch occlusion, beading, aneurysm, or atheromatous type irregularity. IMPRESSION: Negative intracranial MRA. Electronically Signed   By: Monte Fantasia M.D.   On: 08/10/2017 09:47   Ct Head Code Stroke Wo Contrast  Result Date: 08/09/2017 CLINICAL DATA:  Code stroke.  67 y/o  F; left-sided facial droop. EXAM: CT HEAD WITHOUT CONTRAST TECHNIQUE: Contiguous axial images were obtained from the base of the skull through the vertex without intravenous contrast. COMPARISON:  None. FINDINGS: Brain: No evidence of acute infarction, hemorrhage, hydrocephalus, extra-axial collection or mass lesion/mass effect. Few nonspecific foci of hypoattenuation in subcortical and periventricular white matter are compatible with mild chronic microvascular ischemic changes. Vascular: No hyperdense vessel or unexpected calcification. Skull: Normal. Negative for fracture or focal lesion. Sinuses/Orbits: No acute finding. Other: None. ASPECTS [facility] Stroke Program Early CT Score) - Ganglionic level infarction (caudate, lentiform nuclei, internal capsule, insula, M1-M3 cortex): 7 - Supraganglionic infarction (M4-M6 cortex):  3 Total score (0-10 with 10 being normal): 10 IMPRESSION: 1. No acute intracranial abnormality identified. 2. Mild chronic microvascular ischemic changes of the brain. 3. ASPECTS is 10 These results were communicated to Dr. Leonel Ramsay at 10:06 pmon 2/21/2019by text page via the [facility] messaging system. Electronically Signed   By: Kristine Garbe M.D.   On: 08/09/2017 22:07    Microbiology: No results found for this or any previous visit (from the past 240 hour(s)).   Labs: Basic Metabolic Panel: Recent Labs  Lab 08/07/17 0932 08/09/17 2143 08/09/17 2155  NA 140 138 142  K 3.7 3.5 4.1  CL 107 105 106  CO2 23 22  --   GLUCOSE 108 106* 103*  BUN 11 11 16   CREATININE 0.89 0.84 0.80  CALCIUM 9.6 9.2  --    Liver Function Tests: Recent Labs  Lab 08/07/17 0932 08/09/17 2143   AST 18 32  ALT 13 24  ALKPHOS 80 71  BILITOT 0.3 0.6  PROT 7.3 7.5  ALBUMIN 3.9 4.3   No results for input(s): LIPASE, AMYLASE in the last 168 hours. No results for input(s): AMMONIA in the last 168 hours. CBC: Recent Labs  Lab 08/07/17 0932 08/09/17 2143 08/09/17 2155  WBC 3.7* 6.3  --   NEUTROABS 1.5 2.8  --   HGB 10.8* 11.4* 12.6  HCT 33.1* 35.1* 37.0  MCV 88.2 88.4  --   PLT 260 270  --    Cardiac Enzymes: No results for input(s): CKTOTAL, CKMB, CKMBINDEX, TROPONINI in the last 168 hours. BNP: BNP (last 3 results) No results for input(s): BNP in the last 8760 hours.  ProBNP (last 3 results) No results for input(s): PROBNP in the last 8760 hours.  CBG: Recent Labs  Lab 08/09/17 2209  GLUCAP 95       Signed:  Kayleen Memos, MD [facility] 08/10/2017, 3:58 PM

## 2017-08-14 ENCOUNTER — Other Ambulatory Visit: Payer: Medicare Other

## 2017-08-14 ENCOUNTER — Encounter: Payer: Self-pay | Admitting: *Deleted

## 2017-08-14 ENCOUNTER — Inpatient Hospital Stay: Payer: Medicare Other

## 2017-08-14 ENCOUNTER — Inpatient Hospital Stay (HOSPITAL_BASED_OUTPATIENT_CLINIC_OR_DEPARTMENT_OTHER): Payer: Medicare Other | Admitting: Adult Health

## 2017-08-14 ENCOUNTER — Encounter: Payer: Self-pay | Admitting: Adult Health

## 2017-08-14 ENCOUNTER — Other Ambulatory Visit: Payer: Self-pay | Admitting: Adult Health

## 2017-08-14 ENCOUNTER — Other Ambulatory Visit: Payer: Self-pay | Admitting: Oncology

## 2017-08-14 ENCOUNTER — Encounter: Payer: Self-pay | Admitting: Oncology

## 2017-08-14 VITALS — BP 178/71 | HR 82 | Temp 98.1°F | Resp 18 | Ht 63.5 in | Wt 153.8 lb

## 2017-08-14 DIAGNOSIS — Z5112 Encounter for antineoplastic immunotherapy: Secondary | ICD-10-CM | POA: Diagnosis not present

## 2017-08-14 DIAGNOSIS — C50411 Malignant neoplasm of upper-outer quadrant of right female breast: Secondary | ICD-10-CM

## 2017-08-14 DIAGNOSIS — Z171 Estrogen receptor negative status [ER-]: Principal | ICD-10-CM

## 2017-08-14 DIAGNOSIS — Z95828 Presence of other vascular implants and grafts: Secondary | ICD-10-CM

## 2017-08-14 DIAGNOSIS — I10 Essential (primary) hypertension: Secondary | ICD-10-CM

## 2017-08-14 LAB — COMPREHENSIVE METABOLIC PANEL
ALT: 32 U/L (ref 0–55)
AST: 24 U/L (ref 5–34)
Albumin: 4 g/dL (ref 3.5–5.0)
Alkaline Phosphatase: 80 U/L (ref 40–150)
Anion gap: 10 (ref 3–11)
BUN: 10 mg/dL (ref 7–26)
CO2: 26 mmol/L (ref 22–29)
Calcium: 9.5 mg/dL (ref 8.4–10.4)
Chloride: 105 mmol/L (ref 98–109)
Creatinine, Ser: 0.87 mg/dL (ref 0.60–1.10)
GFR calc Af Amer: >60 mL/min (ref >60)
GFR calc non Af Amer: >60 mL/min (ref >60)
Glucose, Bld: 96 mg/dL (ref 70–140)
Potassium: 3.9 mmol/L (ref 3.5–5.1)
Sodium: 141 mmol/L (ref 136–145)
Total Bilirubin: 0.3 mg/dL (ref 0.2–1.2)
Total Protein: 7.3 g/dL (ref 6.4–8.3)

## 2017-08-14 LAB — CBC WITH DIFFERENTIAL/PLATELET
Basophils Absolute: 0 10*3/uL (ref 0.0–0.1)
Basophils Relative: 1 %
Eosinophils Absolute: 0.1 10*3/uL (ref 0.0–0.5)
Eosinophils Relative: 3 %
HCT: 31.6 % — ABNORMAL LOW (ref 34.8–46.6)
Hemoglobin: 10.3 g/dL — ABNORMAL LOW (ref 11.6–15.9)
Lymphocytes Relative: 44 %
Lymphs Abs: 1.9 10*3/uL (ref 0.9–3.3)
MCH: 28.8 pg (ref 25.1–34.0)
MCHC: 32.7 g/dL (ref 31.5–36.0)
MCV: 87.9 fL (ref 79.5–101.0)
Monocytes Absolute: 0.3 10*3/uL (ref 0.1–0.9)
Monocytes Relative: 7 %
Neutro Abs: 2 10*3/uL (ref 1.5–6.5)
Neutrophils Relative %: 45 %
Platelets: 267 10*3/uL (ref 145–400)
RBC: 3.6 MIL/uL — ABNORMAL LOW (ref 3.70–5.45)
RDW: 13.3 % (ref 11.2–14.5)
WBC: 4.4 10*3/uL (ref 3.9–10.3)

## 2017-08-14 MED ORDER — SODIUM CHLORIDE 0.9 % IV SOLN
20.0000 mg | Freq: Once | INTRAVENOUS | Status: AC
  Administered 2017-08-14: 20 mg via INTRAVENOUS
  Filled 2017-08-14: qty 2

## 2017-08-14 MED ORDER — LORAZEPAM 0.5 MG PO TABS: 0.5000 mg | ORAL_TABLET | Freq: Four times a day (QID) | ORAL | 0 refills | Status: DC | PRN

## 2017-08-14 MED ORDER — FAMOTIDINE IN NACL 20-0.9 MG/50ML-% IV SOLN: 20.0000 mg | Freq: Once | INTRAVENOUS | Status: DC

## 2017-08-14 MED ORDER — DEXAMETHASONE SODIUM PHOSPHATE 100 MG/10ML IJ SOLN
20.0000 mg | Freq: Once | INTRAMUSCULAR | Status: AC
  Administered 2017-08-14: 20 mg via INTRAVENOUS
  Filled 2017-08-14: qty 2

## 2017-08-14 MED ORDER — SODIUM CHLORIDE 0.9% FLUSH
10.0000 mL | INTRAVENOUS | Status: DC | PRN
  Administered 2017-08-14: 10 mL
  Filled 2017-08-14: qty 10

## 2017-08-14 MED ORDER — HEPARIN SOD (PORK) LOCK FLUSH 100 UNIT/ML IV SOLN
500.0000 [IU] | Freq: Once | INTRAVENOUS | Status: AC | PRN
  Administered 2017-08-14: 500 [IU]
  Filled 2017-08-14: qty 5

## 2017-08-14 MED ORDER — SODIUM CHLORIDE 0.9% FLUSH
10.0000 mL | INTRAVENOUS | Status: DC | PRN
  Administered 2017-08-14: 10 mL via INTRAVENOUS
  Filled 2017-08-14: qty 10

## 2017-08-14 MED ORDER — SODIUM CHLORIDE 0.9 % IV SOLN
Freq: Once | INTRAVENOUS | Status: AC
  Administered 2017-08-14: 13:00:00 via INTRAVENOUS

## 2017-08-14 MED ORDER — DIPHENHYDRAMINE HCL 50 MG/ML IJ SOLN
INTRAMUSCULAR | Status: AC
  Filled 2017-08-14: qty 1

## 2017-08-14 MED ORDER — SODIUM CHLORIDE 0.9 % IV SOLN
80.0000 mg/m2 | Freq: Once | INTRAVENOUS | Status: AC
  Administered 2017-08-14: 138 mg via INTRAVENOUS
  Filled 2017-08-14: qty 23

## 2017-08-14 MED ORDER — DIPHENHYDRAMINE HCL 50 MG/ML IJ SOLN
50.0000 mg | Freq: Once | INTRAMUSCULAR | Status: AC
  Administered 2017-08-14: 50 mg via INTRAVENOUS

## 2017-08-14 MED ORDER — LISINOPRIL 5 MG PO TABS: 5.0000 mg | ORAL_TABLET | Freq: Every day | ORAL | 0 refills | Status: DC

## 2017-08-14 NOTE — Progress Notes (Signed)
Met w/ pt to introduce myself as her Arboriculturist and to discuss copay assistance.  Pt gave me consent to apply to PAF in her behalf but unfortunately the pt's household income exceeds the guidelines of their program as well as for the [employer].

## 2017-08-14 NOTE — Patient Instructions (Signed)
[facility] Discharge Instructions for Patients Receiving Chemotherapy  Today you received the following chemotherapy agents:  Taxol.  To help prevent nausea and vomiting after your treatment, we encourage you to take your nausea medication as directed.   If you develop nausea and vomiting that is not controlled by your nausea medication, call the clinic.   BELOW ARE SYMPTOMS THAT SHOULD BE REPORTED IMMEDIATELY:  *FEVER GREATER THAN 100.5 F  *CHILLS WITH OR WITHOUT FEVER  NAUSEA AND VOMITING THAT IS NOT CONTROLLED WITH YOUR NAUSEA MEDICATION  *UNUSUAL SHORTNESS OF BREATH  *UNUSUAL BRUISING OR BLEEDING  TENDERNESS IN MOUTH AND THROAT WITH OR WITHOUT PRESENCE OF ULCERS  *URINARY PROBLEMS  *BOWEL PROBLEMS  UNUSUAL RASH Items with * indicate a potential emergency and should be followed up as soon as possible.  Feel free to call the clinic should you have any questions or concerns. The clinic phone number is [phone].  Please show the CHEMO ALERT CARD at check-in to the Emergency Department and triage nurse.   

## 2017-08-14 NOTE — Progress Notes (Addendum)
[place]  Telephone:([phone] [phone]     ID: GALAXY BORDEN DOB: 01/01/1951  MR#: [id number]  TOI#:[id number]  Patient Care Team: Kelton Pillar, MD as PCP - General (Family Medicine) Magrinat, Virgie Dad, MD as Consulting Physician (Oncology) Rolm Bookbinder, MD as Consulting Physician (General Surgery) Eppie Gibson, MD as Attending Physician (Radiation Oncology) Newt Minion, MD as Consulting Physician (Orthopedic Surgery) Regal, Tamala Fothergill, DPM as Consulting Physician (Podiatry) Neldon Mc, Donnamarie Poag, MD as Consulting Physician (Allergy and Immunology) Scot Dock, NP OTHER MD:  CHIEF COMPLAINT: Estrogen receptor negative breast cancer  CURRENT TREATMENT: Adjuvant chemotherapy/immunotherapy   HISTORY OF CURRENT ILLNESS: From the original intake note:  "April [name]" had bilateral screening mammography at [facility] 06/06/2017.  This showed a possible mass in the right breast at the 11 o'clock position.  On 06/13/2017 she underwent right diagnostic mammography and ultrasonography.  Breast density was category C.  In the right breast at the 11 o'clock position there was a 1 cm area by mammography.  By ultrasound this confirmed a 1.0 cm irregular mass with lobulated margins in the upper outer quadrant of the right breast.  There was a second, 0.4 cm lobulated mass in the same quadrant.  The right axilla was sonographically benign.  On 06/20/2017 biopsy of the 2 right breast masses in question was performed.  The final pathology (SAA [id number]) found the smaller mass to be only fibrocystic change.  This is felt to be concordant.  The larger mass however was an invasive ductal carcinoma, grade 3, estrogen and progesterone receptor negative, with an MIB-1 of 30%, and HER-2 amplified, with a signals ratio of 2.24.  The number per cell was 4.60.  The patient's subsequent history is as detailed below.  INTERVAL HISTORY: Peola returns to the office today for  follow up of her estrogen receptor negative breast cancer, Her-2 positive breast cancer.  She started treatment last week with Paclitaxel and Trastuzumab.  Unfortunately, this week she had an ER visit.     REVIEW OF SYSTEMS: [name] is feeling relatively well.  Two days after receiving her chemotherapy, [name] noted some swelling in her tongue and slurred speech.  She called our office and was directed to the emergency room.  She underwent an MRI brain, MRA brain, echocardiogram, swallow study that were all normal.  She was also noted to have mild left facial droop by ER staff.  She never had rash, shortness of breath, cough, unilateral weakness, numbness/tingling.  Her symptoms resolved while she was in the ER.  She was diagnosed with a TIA and she was referred to Dr. Leonie Man in neurology in 6 weeks time.  She was noted to have elevated LDL and Pravastatin was started.    [name] notes that she tolerated chemotherapy well.  She started on Compazine the night after her treatment and continued it daily through Thursday.  Then on Friday she took Zofran.  She is feeling well today and a detailed ROS is non contributory.      PAST MEDICAL HISTORY: Past Medical History:  Diagnosis Date  . Breast cancer ([place]) 05/2017   right breast  . Eczema   . Family history of breast cancer   . Family history of ovarian cancer   . Hypertension    toxemia during pregnancy, no meds now    PAST SURGICAL HISTORY: Past Surgical History:  Procedure Laterality Date  . ABDOMINAL HYSTERECTOMY    . BREAST LUMPECTOMY WITH RADIOACTIVE SEED AND SENTINEL LYMPH NODE  BIOPSY Right 07/17/2017   Procedure: BREAST LUMPECTOMY WITH RADIOACTIVE SEED AND SENTINEL LYMPH NODE BIOPSY;  Surgeon: Rolm Bookbinder, MD;  Location: [place];  Service: General;  Laterality: Right;  . CESAREAN SECTION     x4  . DILATION AND CURETTAGE OF UTERUS    . KNEE SURGERY Right   . PORTACATH PLACEMENT N/A 07/17/2017   Procedure:  INSERTION PORT-A-CATH WITH [place];  Surgeon: Rolm Bookbinder, MD;  Location: [place];  Service: General;  Laterality: N/A;    FAMILY HISTORY Family History  Problem Relation Age of Onset  . Breast cancer Mother 29       again at 68 in other breast   . Heart attack Father 36  . Breast cancer Sister 25  . Breast cancer Maternal Grandmother 13       spread to lungs, died at 26  . Ovarian cancer Cousin 48  The patient's father died at age 3 from a heart attack.  The patient's mother is currently living at age 106 (as of January 2019).  The patient had 1 sister who was diagnosed with breast cancer at age 53 and died from metastatic disease at age 25.  The patient has 1 brother.  In addition the patient's mother was diagnosed with breast cancer at age 72, on the left side, and now has a right-sided breast cancer diagnosed in January 2019.  There is in addition a cousin with ovarian cancer diagnosed when she was 68 years old  GYNECOLOGIC HISTORY:  No LMP recorded. Patient has had a hysterectomy. Menarche age 78, first live birth age 42, the patient had 3 live births, 1 of whom survived only 2 days.  She underwent hysterectomy without salpingo-oophorectomy September 05, 1978.  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  She is now retired.  Her husband [name] his Theme park manager at [place].  The patient's daughter [name] lives in [place] where she is an Tourist information centre manager.  The patient's daughter [name] lives in [place] working for the department of defense.  The patient has 3 grandchildren.     ADVANCED DIRECTIVES: Not in place   HEALTH MAINTENANCE: Social History   Tobacco Use  . Smoking status: Never Smoker  . Smokeless tobacco: Never Used  Substance Use Topics  . Alcohol use: Yes    Comment: social  . Drug use: No      Colonoscopy: April 2018/[facility]  PAP: Status post hysterectomy  Bone density:   Allergies  Allergen Reactions  . Tape Itching and Rash  . Nsaids   . Sulfa Antibiotics Rash    Current Outpatient Medications  Medication Sig Dispense Refill  . aspirin 81 MG chewable tablet Chew 4 tablets (324 mg total) by mouth daily. 30 tablet 0  . CALCIUM-VITAMIN D PO Take 1 tablet by mouth daily.    Marland Kitchen lidocaine-prilocaine (EMLA) cream Apply to affected area once (Patient taking differently: Apply 1 application topically as needed (for pain). ) 30 g 3  . ondansetron (ZOFRAN) 8 MG tablet Take 1 tablet (8 mg total) by mouth 2 (two) times daily as needed (Nausea or vomiting). 30 tablet 1  . pravastatin (PRAVACHOL) 80 MG tablet Take 1 tablet (80 mg total) by mouth daily at 6 PM. 60 tablet 0  . prochlorperazine (COMPAZINE) 10 MG tablet Take 1 tablet (10 mg total) by  mouth every 6 (six) hours as needed (Nausea or vomiting). 30 tablet 1  . vitamin B-12 (CYANOCOBALAMIN) 1000 MCG tablet Take 1,000 mcg by mouth daily.    Marland Kitchen lisinopril (PRINIVIL,ZESTRIL) 5 MG tablet Take 1 tablet (5 mg total) by mouth daily. 30 tablet 0  . LORazepam (ATIVAN) 0.5 MG tablet Take 1 tablet (0.5 mg total) by mouth every 6 (six) hours as needed (Nausea or vomiting). (Patient not taking: Reported on 08/14/2017) 30 tablet 0   No current facility-administered medications for this visit.    Facility-Administered Medications Ordered in Other Visits  Medication Dose Route Frequency Provider Last Rate Last Dose  . dexamethasone (DECADRON) 20 mg in sodium chloride 0.9 % 50 mL IVPB  20 mg Intravenous Once Magrinat, Virgie Dad, MD   20 mg at 08/14/17 1302  . heparin lock flush 100 unit/mL  500 Units Intracatheter Once PRN Magrinat, Virgie Dad, MD      . PACLitaxel (TAXOL) 138 mg in sodium chloride 0.9 % 250 mL chemo infusion (</= 83m/m2)  80 mg/m2 (Treatment Plan Recorded) Intravenous Once Magrinat, GVirgie Dad MD      . sodium chloride flush (NS)  0.9 % injection 10 mL  10 mL Intracatheter PRN Magrinat, GVirgie Dad MD        OBJECTIVE:   Vitals:   08/14/17 1054  BP: (!) 178/71  Pulse: 82  Resp: 18  Temp: 98.1 F (36.7 C)  SpO2: 97%     Body mass index is 26.82 kg/m.   Wt Readings from Last 3 Encounters:  08/14/17 153 lb 12.8 oz (69.8 kg)  08/10/17 154 lb 1.6 oz (69.9 kg)  08/07/17 153 lb 12.8 oz (69.8 kg)  ECOG FS:0 - Asymptomatic GENERAL: Patient is a well appearing female in no acute distress HEENT:  Sclerae anicteric.  Oropharynx clear and moist. No ulcerations or evidence of oropharyngeal candidiasis. Neck is supple.  NODES:  No cervical, supraclavicular, or axillary lymphadenopathy palpated.  BREAST EXAM:  Right lumpectomy site healing well, no sign of infection noted LUNGS:  Clear to auscultation bilaterally.  No wheezes or rhonchi. HEART:  Regular rate and rhythm. No murmur appreciated. ABDOMEN:  Soft, nontender.  Positive, normoactive bowel sounds. No organomegaly palpated. MSK:  No focal spinal tenderness to palpation. Full range of motion bilaterally in the upper extremities. EXTREMITIES:  No peripheral edema.   SKIN:  Clear with no obvious rashes or skin changes. No nail dyscrasia. NEURO:  Nonfocal. Well oriented.  Appropriate affect.     LAB RESULTS:  CMP     Component Value Date/Time   NA 141 08/14/2017 1021   K 3.9 08/14/2017 1021   CL 105 08/14/2017 1021   CO2 26 08/14/2017 1021   GLUCOSE 96 08/14/2017 1021   BUN 10 08/14/2017 1021   CREATININE 0.87 08/14/2017 1021   CREATININE 1.01 06/27/2017 0843   CALCIUM 9.5 08/14/2017 1021   PROT 7.3 08/14/2017 1021   ALBUMIN 4.0 08/14/2017 1021   AST 24 08/14/2017 1021   AST 16 06/27/2017 0843   ALT 32 08/14/2017 1021   ALT 14 06/27/2017 0843   ALKPHOS 80 08/14/2017 1021   BILITOT 0.3 08/14/2017 1021   BILITOT 0.2 06/27/2017 0843   GFRNONAA >60 08/14/2017 1021   GFRNONAA 56 (L) 06/27/2017 0843   GFRAA >60 08/14/2017 1021   GFRAA >60 06/27/2017  0843    No results found for: TOTALPROTELP, ALBUMINELP, A1GS, A2GS, BETS, BETA2SER, GAMS, MSPIKE, SPEI  No results found for: KPAFRELGTCHN, LAMBDASER, KAPLAMBRATIO  Lab Results  Component Value Date   WBC 4.4 08/14/2017   NEUTROABS 2.0 08/14/2017   HGB 10.3 (L) 08/14/2017   HCT 31.6 (L) 08/14/2017   MCV 87.9 08/14/2017   PLT 267 08/14/2017    _0 @  No results found for: LABCA2  No components found for: QBVQXI503  Recent Labs  Lab 08/09/17 2143  INR 1.06    No results found for: LABCA2  No results found for: UUE280  No results found for: KLK917  No results found for: HXT056  No results found for: CA2729  No components found for: HGQUANT  No results found for: CEA1 / No results found for: CEA1   No results found for: AFPTUMOR  No results found for: CHROMOGRNA  No results found for: PSA1  Appointment on 08/14/2017  Component Date Value Ref Range Status  . Sodium 08/14/2017 141  136 - 145 mmol/L Final  . Potassium 08/14/2017 3.9  3.5 - 5.1 mmol/L Final  . Chloride 08/14/2017 105  98 - 109 mmol/L Final  . CO2 08/14/2017 26  22 - 29 mmol/L Final  . Glucose, Bld 08/14/2017 96  70 - 140 mg/dL Final  . BUN 08/14/2017 10  7 - 26 mg/dL Final  . Creatinine, Ser 08/14/2017 0.87  0.60 - 1.10 mg/dL Final  . Calcium 08/14/2017 9.5  8.4 - 10.4 mg/dL Final  . Total Protein 08/14/2017 7.3  6.4 - 8.3 g/dL Final  . Albumin 08/14/2017 4.0  3.5 - 5.0 g/dL Final  . AST 08/14/2017 24  5 - 34 U/L Final  . ALT 08/14/2017 32  0 - 55 U/L Final  . Alkaline Phosphatase 08/14/2017 80  40 - 150 U/L Final  . Total Bilirubin 08/14/2017 0.3  0.2 - 1.2 mg/dL Final  . GFR calc non Af Amer 08/14/2017 >60  >60 mL/min Final  . GFR calc Af Amer 08/14/2017 >60  >60 mL/min Final   Comment: (NOTE) The eGFR has been calculated using the CKD EPI equation. This calculation has not been validated in all clinical situations. eGFR's persistently <60 mL/min signify possible Chronic  Kidney Disease.   Georgiann Hahn gap 08/14/2017 10  3 - 11 Final   Performed at [facility], [place] [address], [place], [place] [address]  . WBC 08/14/2017 4.4  3.9 - 10.3 K/uL Final  . RBC 08/14/2017 3.60* 3.70 - 5.45 MIL/uL Final  . Hemoglobin 08/14/2017 10.3* 11.6 - 15.9 g/dL Final  . HCT 08/14/2017 31.6* 34.8 - 46.6 % Final  . MCV 08/14/2017 87.9  79.5 - 101.0 fL Final  . MCH 08/14/2017 28.8  25.1 - 34.0 pg Final  . MCHC 08/14/2017 32.7  31.5 - 36.0 g/dL Final  . RDW 08/14/2017 13.3  11.2 - 14.5 % Final  . Platelets 08/14/2017 267  145 - 400 K/uL Final  . Neutrophils Relative % 08/14/2017 45  % Final  . Neutro Abs 08/14/2017 2.0  1.5 - 6.5 K/uL Final  . Lymphocytes Relative 08/14/2017 44  % Final  . Lymphs Abs 08/14/2017 1.9  0.9 - 3.3 K/uL Final  . Monocytes Relative 08/14/2017 7  % Final  . Monocytes Absolute 08/14/2017 0.3  0.1 - 0.9 K/uL Final  . Eosinophils Relative 08/14/2017 3  % Final  . Eosinophils Absolute 08/14/2017 0.1  0.0 - 0.5 K/uL Final  . Basophils Relative 08/14/2017 1  % Final  . Basophils Absolute 08/14/2017 0.0  0.0 - 0.1 K/uL Final   Performed at [facility], [place] [address] [place], [place] [address]    (this displays the last labs from the last 3 days)  No results found for: TOTALPROTELP, ALBUMINELP, A1GS, A2GS, BETS, BETA2SER, GAMS, MSPIKE, SPEI (this displays SPEP labs)  No results found for: KPAFRELGTCHN, LAMBDASER, KAPLAMBRATIO (kappa/lambda light chains)  No results found for: HGBA, HGBA2QUANT, HGBFQUANT, HGBSQUAN (Hemoglobinopathy evaluation)   No results found for: LDH  No results found for: IRON, TIBC, IRONPCTSAT (Iron and TIBC)  No results found for: FERRITIN  Urinalysis    Component Value Date/Time   COLORURINE COLORLESS (A) 08/09/2017 2224   APPEARANCEUR CLEAR 08/09/2017 2224   LABSPEC 1.004 (L) 08/09/2017 2224   PHURINE 6.0 08/09/2017 2224   GLUCOSEU NEGATIVE 08/09/2017 2224   HGBUR  NEGATIVE 08/09/2017 2224   BILIRUBINUR NEGATIVE 08/09/2017 2224   KETONESUR NEGATIVE 08/09/2017 2224   PROTEINUR NEGATIVE 08/09/2017 2224   NITRITE NEGATIVE 08/09/2017 2224   LEUKOCYTESUR NEGATIVE 08/09/2017 2224     STUDIES: Outside studies reviewed  ELIGIBLE FOR AVAILABLE RESEARCH PROTOCOL: no  ASSESSMENT: 67 y.o. [place] woman status post right breast upper outer quadrant biopsy 06/20/2017 for a clinical T1b N0, stage IA invasive ductal carcinoma, grade 3, estrogen and progesterone receptor negative, but HER-2 amplified, with an MIB-1 of 30%  (1) genetics testing 07/05/2017 through the Common Hereditary Cancer Panel offered by [facility] found no deleterious mutations in APC, ATM, AXIN2, BARD1, BMPR1A, BRCA1, BRCA2, BRIP1, CDH1, CDKN2A (p14ARF), CDKN2A (p16INK4a), CKD4, CHEK2, CTNNA1, DICER1, EPCAM (Deletion/duplication testing only), GREM1 (promoter region deletion/duplication testing only), KIT, MEN1, MLH1, MSH2, MSH3, MSH6, MUTYH, NBN, NF1, NHTL1, PALB2, PDGFRA, PMS2, POLD1, POLE, PTEN, RAD50, RAD51C, RAD51D, SDHB, SDHC, SDHD, SMAD4, SMARCA4. STK11, TP53, TSC1, TSC2, and VHL.  The following genes were evaluated for sequence changes only: SDHA and HOXB13 c.251G>A variant only.  (a) A Variant of uncertain significance in MSH2 was identified c.1331G>T (p.Arg444Leu).   (2) status post right lumpectomy and sentinel lymph node sampling 07/17/2017 for a pT1c pN0, stage IA invasive ductal carcinoma, grade 2, with negative margins.  A total of 5 lymph nodes were removed  (3) adjuvant chemotherapy will consist of paclitaxel weekly x12 together with trastuzumab every 21 days starting 08/07/2017  (4) trastuzumab will be continued to total 1 year  (a) echo on 08/02/17 demonstrates an ejection fraction of 65-70%  5) adjuvant radiation to follow  PLAN: Kaylor is feeling well today.  She met with Dr. Jana Hakim today as well.  We reviewed her labs and tests from the Emergency Room.   She was noted to have a TIA.  Dr. Jana Hakim does not think that it is related to the chemotherapy.  She is hypertensive, and has been during her last few appointments.  I started Lisinopril 45m daily.  Dr. MJana Hakimreviewed the above with DNeoma Lamingand her husband today in detail.  DAniawill return in one week for labs, f/u and her next treatment.  She knows to call for any questions or concerns prior to her next appointment with [place]    A total of (30) minutes of face-to-face time was spent with this patient with greater than 50% of that time in counseling and care-coordination.    LWilber Bihari NP 08/14/17 1:11 PM Medical Oncology [id number] [id number]. [phone]   Fax. [phone]   ADDENDUM: DAmeenahhad with very much sounds like a transient ischemic attack.  This was temporally related to her chemo but I  do not think it was chemo associated in any other way.  She is now completely asymptomatic, with a normal neuro exam.  I think it is safe to proceed to chemo today.  However I have alerted her to let us know of any changes that may occur in the next 48-72 hours.  She will receive her next treatment on 08/21/2017 and we will make sure to see her on that date.  I personally saw this patient and performed a substantive portion of this encounter with the listed APP documented above.   Chauncey Cruel, MD Medical Oncology and Hematology [facility] [address] [place], [place] [address] Tel. [phone]    Fax. [phone]

## 2017-08-16 ENCOUNTER — Encounter: Payer: Self-pay | Admitting: Physical Therapy

## 2017-08-16 ENCOUNTER — Ambulatory Visit: Payer: Medicare Other | Attending: General Surgery | Admitting: Physical Therapy

## 2017-08-16 ENCOUNTER — Other Ambulatory Visit: Payer: Self-pay

## 2017-08-16 DIAGNOSIS — C50411 Malignant neoplasm of upper-outer quadrant of right female breast: Secondary | ICD-10-CM

## 2017-08-16 DIAGNOSIS — Z171 Estrogen receptor negative status [ER-]: Secondary | ICD-10-CM | POA: Insufficient documentation

## 2017-08-16 DIAGNOSIS — R293 Abnormal posture: Secondary | ICD-10-CM | POA: Diagnosis present

## 2017-08-16 DIAGNOSIS — Z483 Aftercare following surgery for neoplasm: Secondary | ICD-10-CM | POA: Diagnosis present

## 2017-08-16 NOTE — Therapy (Addendum)
[place], [place], [address] Phone: [phone]   Fax:  [phone]  Physical Therapy Treatment  Patient Details  Name: April [name] MRN: [id number] Date of Birth: 1950/08/21 Referring Provider: Dr. Rolm Bookbinder   Encounter Date: 08/16/2017  PT End of Session - 08/16/17 1058    Visit Number  2    Number of Visits  2    PT Start Time  5638    PT Stop Time  1056    PT Time Calculation (min)  38 min    Activity Tolerance  Patient tolerated treatment well;Treatment limited secondary to medical complications (Comment)    Behavior During Therapy  [facility] for tasks assessed/performed       Past Medical History:  Diagnosis Date  . Breast cancer ([place]) 05/2017   right breast  . Eczema   . Family history of breast cancer   . Family history of ovarian cancer   . Hypertension    toxemia during pregnancy, no meds now    Past Surgical History:  Procedure Laterality Date  . ABDOMINAL HYSTERECTOMY    . BREAST LUMPECTOMY WITH RADIOACTIVE SEED AND SENTINEL LYMPH NODE BIOPSY Right 07/17/2017   Procedure: BREAST LUMPECTOMY WITH RADIOACTIVE SEED AND SENTINEL LYMPH NODE BIOPSY;  Surgeon: Rolm Bookbinder, MD;  Location: [facility];  Service: General;  Laterality: Right;  . CESAREAN SECTION     x4  . DILATION AND CURETTAGE OF UTERUS    . KNEE SURGERY Right   . PORTACATH PLACEMENT N/A 07/17/2017   Procedure: INSERTION PORT-A-CATH WITH [place];  Surgeon: Rolm Bookbinder, MD;  Location: [facility];  Service: General;  Laterality: N/A;    There were no vitals filed for this visit.  Subjective Assessment - 08/16/17 1024    Subjective  Patient reports she underwent a right lumpectomy and sentinel node biopsy (5 negative nodes removed) on 07/17/17. She is currently undergoing chemotherapy for ER/PR negative and HER2 positive breast cancer. She will undergo radiation after completion  of chemotherapy. She c/o some tightness in her right axilla. She reports she had a reaction to her first chemo dose and had tongue swelling which required a hospital stay.    Pertinent History  Patient was diagnosed on 06/06/17 with right grade 3 invasive ductal carcinoma breast cancer. It measured 1.6 cm and was located in the upper outer quadrant. It is ER/PR negative and HER2 posiitve with a Ki67 of 30%. She has no other medical problems but has a sister who died of breast cancer and her Mom currently has a breast cancer recurrence. She underwent a right lumpectomy and sentinel node biopsy (5 negative nodes removed) on 07/17/17. She is currently undergoing chemotherapy.    Patient Stated Goals  Make sure my arm is ok.    Currently in Pain?  No/denies         [facility] PT Assessment - 08/16/17 0001      Assessment   Medical Diagnosis  s/p right lumpectomy and SLNB    Referring Provider  Dr. Rolm Bookbinder    Onset Date/Surgical Date  07/17/17    Hand Dominance  Right    Prior Therapy  Baseline assessment      Precautions   Precautions  Other (comment)    Precaution Comments  recent right lumpectomy; rt arm lymphedema risk      Restrictions   Weight Bearing Restrictions  No      Balance Screen   Has  the patient fallen in the past 6 months  No    Has the patient had a decrease in activity level because of a fear of falling?   No    Is the patient reluctant to leave their home because of a fear of falling?   No      Home Film/video editor residence    Living Arrangements  Spouse/significant other    Available Help at Discharge  Family      Prior Function   Level of [place]  Retired    Leisure  Going to gym daily and does 2 mile walks, 5 miles on bike, and some machines      Cognition   Overall Cognitive Status  Within Functional Limits for tasks assessed      Observation/Other Assessments   Observations  BP taken and was  178/80 today; began BP meds this week      Posture/Postural Control   Posture/Postural Control  Postural limitations    Postural Limitations  Rounded Shoulders;Forward head      ROM / Strength   AROM / PROM / Strength  AROM      AROM   AROM Assessment Site  Shoulder    Right/Left Shoulder  Right    Right Shoulder Extension  50 Degrees    Right Shoulder Flexion  139 Degrees    Right Shoulder ABduction  158 Degrees    Right Shoulder Internal Rotation  70 Degrees    Right Shoulder External Rotation  76 Degrees      Palpation   Palpation comment  Incision sites appear to be very well healed; mild cording present in right medial proximal arm        LYMPHEDEMA/ONCOLOGY QUESTIONNAIRE - 08/16/17 1040      Type   Cancer Type  Right breast      Surgeries   Lumpectomy Date  07/17/17    Sentinel Lymph Node Biopsy Date  07/17/17    Number Lymph Nodes Removed  5      Treatment   Active Chemotherapy Treatment  Yes    Date  08/07/17    Past Chemotherapy Treatment  No    Active Radiation Treatment  No    Past Radiation Treatment  No    Current Hormone Treatment  No    Past Hormone Therapy  No      What other symptoms do you have   Are you Having Heaviness or Tightness  Yes    Are you having Pain  No    Are you having pitting edema  No    Is it Hard or Difficult finding clothes that fit  No    Do you have infections  No    Is there Decreased scar mobility  No    [place] Sign  No      Lymphedema Assessments   Lymphedema Assessments  Upper extremities      Right Upper Extremity Lymphedema   10 cm Proximal to Olecranon Process  29.3 cm    Olecranon Process  23.7 cm    10 cm Proximal to Ulnar Styloid Process  19.1 cm    Just Proximal to Ulnar Styloid Process  13.9 cm    Across Hand at [employer]  17.6 cm    At [place] of 2nd Digit  5.7 cm      Left Upper Extremity Lymphedema   10 cm Proximal to Olecranon Process  28.8 cm    Olecranon Process  23.7 cm    10 cm Proximal to  Ulnar Styloid Process  18.5 cm    Just Proximal to Ulnar Styloid Process  13.6 cm    Across Hand at [employer]  16.8 cm    At [place] of 2nd Digit  5.6 cm        Quick Dash - 08/16/17 0001    Open a tight or new jar  No difficulty    Carry a shopping bag or briefcase  No difficulty    Wash your back  Mild difficulty    Use a knife to cut food  No difficulty    During the past week, to what extent has your arm, shoulder or hand problem interfered with your normal social activities with family, friends, neighbors, or groups?  Not at all    During the past week, to what extent has your arm, shoulder or hand problem limited your work or other regular daily activities  Not at all    Arm, shoulder, or hand pain.  Mild    Tingling (pins and needles) in your arm, shoulder, or hand  Mild    Difficulty Sleeping  No difficulty    DASH Score  2.27 %                    PT Education - 08/16/17 1058    Education provided  Yes    Education Details  Right shoulder abduction stretch leaning into wall; review of HEP given pre-operatively    Person(s) Educated  Patient    Methods  Explanation;Demonstration    Comprehension  Verbalized understanding;Returned demonstration          PT Long Term Goals - 08/16/17 1101      PT LONG TERM GOAL #1   Title  Patient will demonstrate she has returned to baseline since surgery related to shoulder ROM and function.    Time  8    Period  Weeks    Status  Achieved      Breast Clinic Goals - 06/27/17 1206      Patient will be able to verbalize understanding of pertinent lymphedema risk reduction practices relevant to her diagnosis specifically related to skin care.   Time  1    Period  Days    Status  Achieved      Patient will be able to return demonstrate and/or verbalize understanding of the post-op home exercise program related to regaining shoulder range of motion.   Time  1    Period  Days    Status  Achieved      Patient will  be able to verbalize understanding of the importance of attending the postoperative After Breast Cancer Class for further lymphedema risk reduction education and therapeutic exercise.   Time  1    Period  Days    Status  Achieved           Plan - 08/16/17 1059    Clinical Impression Statement  Patient is doing very well s/p right lumpectomy. She appears to be tolerating chemotherapy well (except a reaction after her first treatment) and has regained shoulder ROM and function. There are currently no signs of lymphedema. She is exercising 5x/week. There is no further need for PT at this time but she will attend the After breast Cancer class next week to learn more about lymphedema risk reduction.    PT Treatment/Interventions  ADLs/Self Care  Home Management;Therapeutic exercise;Patient/family education    PT Next Visit Plan  D/C    PT Home Exercise Plan  Post op shoulder ROM HEP    Consulted and Agree with Plan of Care  Patient       Patient will benefit from skilled therapeutic intervention in order to improve the following deficits and impairments:     Visit Diagnosis: Malignant neoplasm of upper-outer quadrant of right breast in female, estrogen receptor negative ([place])  Abnormal posture  Aftercare following surgery for neoplasm     Problem List Patient Active Problem List   Diagnosis Date Noted  . TIA (transient ischemic attack) 08/09/2017  . Hypertension   . Genetic testing 07/06/2017  . Family history of ovarian cancer   . Family history of breast cancer   . Malignant neoplasm of upper-outer quadrant of right breast in female, estrogen receptor negative ([place]) 06/26/2017  . Breast cancer ([place]) 05/19/2017   Annia Friendly, PT 08/16/17 11:01 AM  [place], [place], [address] Phone: [phone]   Fax:  [phone]  Name: April [name] MRN: [id number] Date of Birth:  14-Jan-1951  PHYSICAL THERAPY DISCHARGE SUMMARY  Visits from Start of Care: 2  Current functional level related to goals / functional outcomes: Goals met   Remaining deficits: None   Education / Equipment: HEP; lymphedema risk reduction education  Plan: Patient agrees to discharge.  Patient goals were met. Patient is being discharged due to meeting the stated rehab goals.  ?????         Annia Friendly, [place] 08/16/17 11:43 AM

## 2017-08-21 ENCOUNTER — Inpatient Hospital Stay: Payer: Medicare Other

## 2017-08-21 ENCOUNTER — Encounter: Payer: Self-pay | Admitting: Adult Health

## 2017-08-21 ENCOUNTER — Encounter: Payer: Self-pay | Admitting: *Deleted

## 2017-08-21 ENCOUNTER — Inpatient Hospital Stay: Payer: Medicare Other | Attending: Oncology

## 2017-08-21 ENCOUNTER — Inpatient Hospital Stay (HOSPITAL_BASED_OUTPATIENT_CLINIC_OR_DEPARTMENT_OTHER): Payer: Medicare Other | Admitting: Adult Health

## 2017-08-21 VITALS — BP 152/71 | HR 68 | Temp 98.6°F | Resp 18 | Ht 63.5 in | Wt 154.3 lb

## 2017-08-21 DIAGNOSIS — G459 Transient cerebral ischemic attack, unspecified: Secondary | ICD-10-CM

## 2017-08-21 DIAGNOSIS — Z171 Estrogen receptor negative status [ER-]: Secondary | ICD-10-CM

## 2017-08-21 DIAGNOSIS — C50411 Malignant neoplasm of upper-outer quadrant of right female breast: Secondary | ICD-10-CM | POA: Diagnosis not present

## 2017-08-21 DIAGNOSIS — C50911 Malignant neoplasm of unspecified site of right female breast: Secondary | ICD-10-CM

## 2017-08-21 DIAGNOSIS — Z95828 Presence of other vascular implants and grafts: Secondary | ICD-10-CM

## 2017-08-21 DIAGNOSIS — Z5111 Encounter for antineoplastic chemotherapy: Secondary | ICD-10-CM | POA: Insufficient documentation

## 2017-08-21 DIAGNOSIS — I10 Essential (primary) hypertension: Secondary | ICD-10-CM | POA: Diagnosis not present

## 2017-08-21 DIAGNOSIS — Z5112 Encounter for antineoplastic immunotherapy: Secondary | ICD-10-CM | POA: Insufficient documentation

## 2017-08-21 LAB — CBC WITH DIFFERENTIAL/PLATELET
Basophils Absolute: 0 10*3/uL (ref 0.0–0.1)
Basophils Relative: 1 %
Eosinophils Absolute: 0.1 10*3/uL (ref 0.0–0.5)
Eosinophils Relative: 1 %
HCT: 32.1 % — ABNORMAL LOW (ref 34.8–46.6)
Hemoglobin: 10.3 g/dL — ABNORMAL LOW (ref 11.6–15.9)
Lymphocytes Relative: 41 %
Lymphs Abs: 1.5 10*3/uL (ref 0.9–3.3)
MCH: 28.9 pg (ref 25.1–34.0)
MCHC: 32.1 g/dL (ref 31.5–36.0)
MCV: 90.2 fL (ref 79.5–101.0)
Monocytes Absolute: 0.2 10*3/uL (ref 0.1–0.9)
Monocytes Relative: 6 %
Neutro Abs: 1.9 10*3/uL (ref 1.5–6.5)
Neutrophils Relative %: 51 %
Platelets: 280 10*3/uL (ref 145–400)
RBC: 3.56 MIL/uL — ABNORMAL LOW (ref 3.70–5.45)
RDW: 13.3 % (ref 11.2–14.5)
WBC: 3.7 10*3/uL — ABNORMAL LOW (ref 3.9–10.3)

## 2017-08-21 LAB — COMPREHENSIVE METABOLIC PANEL
ALT: 26 U/L (ref 0–55)
AST: 19 U/L (ref 5–34)
Albumin: 3.9 g/dL (ref 3.5–5.0)
Alkaline Phosphatase: 76 U/L (ref 40–150)
Anion gap: 7 (ref 3–11)
BUN: 15 mg/dL (ref 7–26)
CO2: 26 mmol/L (ref 22–29)
Calcium: 9.9 mg/dL (ref 8.4–10.4)
Chloride: 106 mmol/L (ref 98–109)
Creatinine, Ser: 0.84 mg/dL (ref 0.60–1.10)
GFR calc Af Amer: >60 mL/min (ref >60)
GFR calc non Af Amer: >60 mL/min (ref >60)
Glucose, Bld: 114 mg/dL (ref 70–140)
Potassium: 4.3 mmol/L (ref 3.5–5.1)
Sodium: 139 mmol/L (ref 136–145)
Total Bilirubin: 0.2 mg/dL (ref 0.2–1.2)
Total Protein: 7.1 g/dL (ref 6.4–8.3)

## 2017-08-21 MED ORDER — SODIUM CHLORIDE 0.9 % IV SOLN
20.0000 mg | Freq: Once | INTRAVENOUS | Status: AC
  Administered 2017-08-21: 20 mg via INTRAVENOUS
  Filled 2017-08-21: qty 2

## 2017-08-21 MED ORDER — SODIUM CHLORIDE 0.9% FLUSH
10.0000 mL | INTRAVENOUS | Status: DC | PRN
  Administered 2017-08-21: 10 mL via INTRAVENOUS
  Filled 2017-08-21: qty 10

## 2017-08-21 MED ORDER — DEXAMETHASONE SODIUM PHOSPHATE 100 MG/10ML IJ SOLN
20.0000 mg | Freq: Once | INTRAMUSCULAR | Status: AC
  Administered 2017-08-21: 20 mg via INTRAVENOUS
  Filled 2017-08-21: qty 2

## 2017-08-21 MED ORDER — DIPHENHYDRAMINE HCL 50 MG/ML IJ SOLN
INTRAMUSCULAR | Status: AC
  Filled 2017-08-21: qty 1

## 2017-08-21 MED ORDER — FAMOTIDINE IN NACL 20-0.9 MG/50ML-% IV SOLN: 20.0000 mg | Freq: Once | INTRAVENOUS | Status: DC

## 2017-08-21 MED ORDER — HEPARIN SOD (PORK) LOCK FLUSH 100 UNIT/ML IV SOLN
500.0000 [IU] | Freq: Once | INTRAVENOUS | Status: AC | PRN
  Administered 2017-08-21: 500 [IU]
  Filled 2017-08-21: qty 5

## 2017-08-21 MED ORDER — DIPHENHYDRAMINE HCL 50 MG/ML IJ SOLN
50.0000 mg | Freq: Once | INTRAMUSCULAR | Status: AC
  Administered 2017-08-21: 50 mg via INTRAVENOUS

## 2017-08-21 MED ORDER — SODIUM CHLORIDE 0.9 % IV SOLN
80.0000 mg/m2 | Freq: Once | INTRAVENOUS | Status: AC
  Administered 2017-08-21: 138 mg via INTRAVENOUS
  Filled 2017-08-21: qty 23

## 2017-08-21 MED ORDER — SODIUM CHLORIDE 0.9 % IV SOLN
Freq: Once | INTRAVENOUS | Status: AC
  Administered 2017-08-21: 13:00:00 via INTRAVENOUS

## 2017-08-21 MED ORDER — SODIUM CHLORIDE 0.9% FLUSH
10.0000 mL | INTRAVENOUS | Status: DC | PRN
  Administered 2017-08-21: 10 mL
  Filled 2017-08-21: qty 10

## 2017-08-21 NOTE — Patient Instructions (Signed)
[facility] Discharge Instructions for Patients Receiving Chemotherapy  Today you received the following chemotherapy agents:  Taxol.  To help prevent nausea and vomiting after your treatment, we encourage you to take your nausea medication as directed.   If you develop nausea and vomiting that is not controlled by your nausea medication, call the clinic.   BELOW ARE SYMPTOMS THAT SHOULD BE REPORTED IMMEDIATELY:  *FEVER GREATER THAN 100.5 F  *CHILLS WITH OR WITHOUT FEVER  NAUSEA AND VOMITING THAT IS NOT CONTROLLED WITH YOUR NAUSEA MEDICATION  *UNUSUAL SHORTNESS OF BREATH  *UNUSUAL BRUISING OR BLEEDING  TENDERNESS IN MOUTH AND THROAT WITH OR WITHOUT PRESENCE OF ULCERS  *URINARY PROBLEMS  *BOWEL PROBLEMS  UNUSUAL RASH Items with * indicate a potential emergency and should be followed up as soon as possible.  Feel free to call the clinic should you have any questions or concerns. The clinic phone number is [phone].  Please show the CHEMO ALERT CARD at check-in to the Emergency Department and triage nurse.   

## 2017-08-21 NOTE — Progress Notes (Signed)
[place]  [phone] [phone]) [phone]     ID: MAURICE RAMSEUR DOB: Apr 16, 1951  MR#: [id number]  VQM#:[id number]  Patient Care Team: Kelton Pillar, MD as PCP - General (Family Medicine) Magrinat, Virgie Dad, MD as Consulting Physician (Oncology) Rolm Bookbinder, MD as Consulting Physician (General Surgery) Eppie Gibson, MD as Attending Physician (Radiation Oncology) Newt Minion, MD as Consulting Physician (Orthopedic Surgery) Regal, Tamala Fothergill, DPM as Consulting Physician (Podiatry) Neldon Mc, Donnamarie Poag, MD as Consulting Physician (Allergy and Immunology) Scot Dock, NP OTHER MD:  CHIEF COMPLAINT: Estrogen receptor negative breast cancer  CURRENT TREATMENT: Adjuvant chemotherapy/immunotherapy   HISTORY OF CURRENT ILLNESS: From the original intake note:  "Kenyetta" had bilateral screening mammography at [facility] 06/06/2017.  This showed a possible mass in the right breast at the 11 o'clock position.  On 06/13/2017 she underwent right diagnostic mammography and ultrasonography.  Breast density was category C.  In the right breast at the 11 o'clock position there was a 1 cm area by mammography.  By ultrasound this confirmed a 1.0 cm irregular mass with lobulated margins in the upper outer quadrant of the right breast.  There was a second, 0.4 cm lobulated mass in the same quadrant.  The right axilla was sonographically benign.  On 06/20/2017 biopsy of the 2 right breast masses in question was performed.  The final pathology (SAA [id number]) found the smaller mass to be only fibrocystic change.  This is felt to be concordant.  The larger mass however was an invasive ductal carcinoma, grade 3, estrogen and progesterone receptor negative, with an MIB-1 of 30%, and HER-2 amplified, with a signals ratio of 2.24.  The number per cell was 4.60.  The patient's subsequent history is as detailed below.  INTERVAL HISTORY: Arla returns to the office today for  follow up of her estrogen receptor negative breast cancer, Her-2 positive breast cancer.  She started treatment last week with Paclitaxel and Trastuzumab.  We also started her on Lisinopril for her hypertension.     REVIEW OF SYSTEMS: Angel is feeling well today.  A couple of weeks ago she had a TIA.  She is doing well after this most recent chemotherapy treatment.  She denies any issues such as difficulty sleeping like she had last week.  She was started on Lisinopril last week for her hypertension.  She has been checking her blood pressure at home and it has been lower, in the 68I systolic.  She denies any cough, lip/tongue swelling, rash, dizziness, or any other issues since starting Lisinopril.  She has had some aches and pains, and had a skin lesion on her abdomen pop up that is now healing, but otherwise a detailed ROS is non contributory.    PAST MEDICAL HISTORY: Past Medical History:  Diagnosis Date  . Breast cancer ([place]) 05/2017   right breast  . Eczema   . Family history of breast cancer   . Family history of ovarian cancer   . Hypertension    toxemia during pregnancy, no meds now    PAST SURGICAL HISTORY: Past Surgical History:  Procedure Laterality Date  . ABDOMINAL HYSTERECTOMY    . BREAST LUMPECTOMY WITH RADIOACTIVE SEED AND SENTINEL LYMPH NODE BIOPSY Right 07/17/2017   Procedure: BREAST LUMPECTOMY WITH RADIOACTIVE SEED AND SENTINEL LYMPH NODE BIOPSY;  Surgeon: Rolm Bookbinder, MD;  Location: [facility];  Service: General;  Laterality: Right;  . CESAREAN SECTION     x4  . DILATION AND CURETTAGE OF  UTERUS    . KNEE SURGERY Right   . PORTACATH PLACEMENT N/A 07/17/2017   Procedure: INSERTION PORT-A-CATH WITH [place];  Surgeon: Rolm Bookbinder, MD;  Location: [facility];  Service: General;  Laterality: N/A;    FAMILY HISTORY Family History  Problem Relation Age of Onset  . Breast cancer Mother 44       again at 50 in other breast   .  Heart attack Father 88  . Breast cancer Sister 59  . Breast cancer Maternal Grandmother 55       spread to lungs, died at 1  . Ovarian cancer Cousin 64  The patient's father died at age 82 from a heart attack.  The patient's mother is currently living at age 53 (as of January 2019).  The patient had 1 sister who was diagnosed with breast cancer at age 51 and died from metastatic disease at age 45.  The patient has 1 brother.  In addition the patient's mother was diagnosed with breast cancer at age 13, on the left side, and now has a right-sided breast cancer diagnosed in January 2019.  There is in addition a cousin with ovarian cancer diagnosed when she was 68 years old  GYNECOLOGIC HISTORY:  No LMP recorded. Patient has had a hysterectomy. Menarche age 75, first live birth age 44, the patient had 3 live births, 1 of whom survived only 2 days.  She underwent hysterectomy without salpingo-oophorectomy September 05, 1978.  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  She is now retired.  Her husband [name] his Theme park manager at [employer].  The patient's daughter [name] lives in [place] where she is an Tourist information centre manager.  The patient's daughter [name] lives in [place] working for the department of defense.  The patient has 3 grandchildren.     ADVANCED DIRECTIVES: Not in place   HEALTH MAINTENANCE: Social History   Tobacco Use  . Smoking status: Never Smoker  . Smokeless tobacco: Never Used  Substance Use Topics  . Alcohol use: Yes    Comment: social  . Drug use: No     Colonoscopy: April 2018/[facility]  PAP: Status post hysterectomy  Bone density:   Allergies  Allergen Reactions  . Tape Itching and Rash  . Nsaids   . Sulfa Antibiotics Rash    Current Outpatient Medications  Medication Sig Dispense Refill  . aspirin 81 MG chewable  tablet Chew 4 tablets (324 mg total) by mouth daily. 30 tablet 0  . CALCIUM-VITAMIN D PO Take 1 tablet by mouth daily.    Marland Kitchen lidocaine-prilocaine (EMLA) cream Apply to affected area once (Patient taking differently: Apply 1 application topically as needed (for pain). ) 30 g 3  . lisinopril (PRINIVIL,ZESTRIL) 5 MG tablet Take 1 tablet (5 mg total) by mouth daily. 30 tablet 0  . LORazepam (ATIVAN) 0.5 MG tablet Take 1 tablet (0.5 mg total) by mouth every 6 (six) hours as needed (Nausea or vomiting). 30 tablet 0  . ondansetron (ZOFRAN) 8 MG tablet Take 1 tablet (8 mg total) by mouth 2 (two) times daily as needed (Nausea or vomiting). 30 tablet 1  . pravastatin (PRAVACHOL) 80 MG tablet Take 1 tablet (80 mg total) by mouth daily at 6 PM. 60 tablet 0  . prochlorperazine (COMPAZINE) 10 MG tablet Take 1 tablet (10 mg total) by mouth every  6 (six) hours as needed (Nausea or vomiting). 30 tablet 1  . vitamin B-12 (CYANOCOBALAMIN) 1000 MCG tablet Take 1,000 mcg by mouth daily.     No current facility-administered medications for this visit.     OBJECTIVE:   Vitals:   08/21/17 1155  BP: (!) 152/71  Pulse: 68  Resp: 18  Temp: 98.6 F (37 C)     Body mass index is 26.9 kg/m.   Wt Readings from Last 3 Encounters:  08/21/17 154 lb 4.8 oz (70 kg)  08/14/17 153 lb 12.8 oz (69.8 kg)  08/10/17 154 lb 1.6 oz (69.9 kg)  ECOG FS:0 - Asymptomatic GENERAL: Patient is a well appearing female in no acute distress HEENT:  Sclerae anicteric.  Oropharynx clear and moist. No ulcerations or evidence of oropharyngeal candidiasis. Neck is supple.  NODES:  No cervical, supraclavicular, or axillary lymphadenopathy palpated.  BREAST EXAM:  Right lumpectomy site healing well, no sign of infection noted LUNGS:  Clear to auscultation bilaterally.  No wheezes or rhonchi. HEART:  Regular rate and rhythm. No murmur appreciated. ABDOMEN:  Soft, nontender.  Positive, normoactive bowel sounds. No organomegaly palpated. MSK:   No focal spinal tenderness to palpation. Full range of motion bilaterally in the upper extremities. EXTREMITIES:  No peripheral edema.   SKIN:  Clear with no obvious rashes or skin changes. No nail dyscrasia. Circular healing skin lesion, no erythema, scabbing noted NEURO:  Nonfocal. Well oriented.  Appropriate affect.     LAB RESULTS:  CMP     Component Value Date/Time   NA 139 08/21/2017 1038   K 4.3 08/21/2017 1038   CL 106 08/21/2017 1038   CO2 26 08/21/2017 1038   GLUCOSE 114 08/21/2017 1038   BUN 15 08/21/2017 1038   CREATININE 0.84 08/21/2017 1038   CREATININE 1.01 06/27/2017 0843   CALCIUM 9.9 08/21/2017 1038   PROT 7.1 08/21/2017 1038   ALBUMIN 3.9 08/21/2017 1038   AST 19 08/21/2017 1038   AST 16 06/27/2017 0843   ALT 26 08/21/2017 1038   ALT 14 06/27/2017 0843   ALKPHOS 76 08/21/2017 1038   BILITOT 0.2 08/21/2017 1038   BILITOT 0.2 06/27/2017 0843   GFRNONAA >60 08/21/2017 1038   GFRNONAA 56 (L) 06/27/2017 0843   GFRAA >60 08/21/2017 1038   GFRAA >60 06/27/2017 0843    No results found for: TOTALPROTELP, ALBUMINELP, A1GS, A2GS, BETS, BETA2SER, GAMS, MSPIKE, SPEI  No results found for: KPAFRELGTCHN, LAMBDASER, KAPLAMBRATIO  Lab Results  Component Value Date   WBC 3.7 (L) 08/21/2017   NEUTROABS 1.9 08/21/2017   HGB 10.3 (L) 08/21/2017   HCT 32.1 (L) 08/21/2017   MCV 90.2 08/21/2017   PLT 280 08/21/2017    _0 @  No results found for: LABCA2  No components found for: JMEQAS341  No results for input(s): INR in the last 168 hours.  No results found for: LABCA2  No results found for: DQQ229  No results found for: NLG921  No results found for: JHE174  No results found for: CA2729  No components found for: HGQUANT  No results found for: CEA1 / No results found for: CEA1   No results found for: AFPTUMOR  No results found for: CHROMOGRNA  No results found for: PSA1  Appointment on 08/21/2017  Component Date Value Ref Range  Status  . WBC 08/21/2017 3.7* 3.9 - 10.3 K/uL Final  . RBC 08/21/2017 3.56* 3.70 - 5.45 MIL/uL Final  . Hemoglobin 08/21/2017 10.3* 11.6 - 15.9 g/dL Final  .  HCT 08/21/2017 32.1* 34.8 - 46.6 % Final  . MCV 08/21/2017 90.2  79.5 - 101.0 fL Final  . MCH 08/21/2017 28.9  25.1 - 34.0 pg Final  . MCHC 08/21/2017 32.1  31.5 - 36.0 g/dL Final  . RDW 08/21/2017 13.3  11.2 - 14.5 % Final  . Platelets 08/21/2017 280  145 - 400 K/uL Final  . Neutrophils Relative % 08/21/2017 51  % Final  . Neutro Abs 08/21/2017 1.9  1.5 - 6.5 K/uL Final  . Lymphocytes Relative 08/21/2017 41  % Final  . Lymphs Abs 08/21/2017 1.5  0.9 - 3.3 K/uL Final  . Monocytes Relative 08/21/2017 6  % Final  . Monocytes Absolute 08/21/2017 0.2  0.1 - 0.9 K/uL Final  . Eosinophils Relative 08/21/2017 1  % Final  . Eosinophils Absolute 08/21/2017 0.1  0.0 - 0.5 K/uL Final  . Basophils Relative 08/21/2017 1  % Final  . Basophils Absolute 08/21/2017 0.0  0.0 - 0.1 K/uL Final   Performed at [facility], [place] [address], [place], [place] [address]  . Sodium 08/21/2017 139  136 - 145 mmol/L Final  . Potassium 08/21/2017 4.3  3.5 - 5.1 mmol/L Final  . Chloride 08/21/2017 106  98 - 109 mmol/L Final  . CO2 08/21/2017 26  22 - 29 mmol/L Final  . Glucose, Bld 08/21/2017 114  70 - 140 mg/dL Final  . BUN 08/21/2017 15  7 - 26 mg/dL Final  . Creatinine, Ser 08/21/2017 0.84  0.60 - 1.10 mg/dL Final  . Calcium 08/21/2017 9.9  8.4 - 10.4 mg/dL Final  . Total Protein 08/21/2017 7.1  6.4 - 8.3 g/dL Final  . Albumin 08/21/2017 3.9  3.5 - 5.0 g/dL Final  . AST 08/21/2017 19  5 - 34 U/L Final  . ALT 08/21/2017 26  0 - 55 U/L Final  . Alkaline Phosphatase 08/21/2017 76  40 - 150 U/L Final  . Total Bilirubin 08/21/2017 0.2  0.2 - 1.2 mg/dL Final  . GFR calc non Af Amer 08/21/2017 >60  >60 mL/min Final  . GFR calc Af Amer 08/21/2017 >60  >60 mL/min Final   Comment: (NOTE) The eGFR has been calculated using the CKD EPI  equation. This calculation has not been validated in all clinical situations. eGFR's persistently <60 mL/min signify possible Chronic Kidney Disease.   Georgiann Hahn gap 08/21/2017 7  3 - 11 Final   Performed at [facility], [address] [place], [place] [address]    (this displays the last labs from the last 3 days)  No results found for: TOTALPROTELP, ALBUMINELP, A1GS, A2GS, BETS, BETA2SER, GAMS, MSPIKE, SPEI (this displays SPEP labs)  No results found for: KPAFRELGTCHN, LAMBDASER, KAPLAMBRATIO (kappa/lambda light chains)  No results found for: HGBA, HGBA2QUANT, HGBFQUANT, HGBSQUAN (Hemoglobinopathy evaluation)   No results found for: LDH  No results found for: IRON, TIBC, IRONPCTSAT (Iron and TIBC)  No results found for: FERRITIN  Urinalysis    Component Value Date/Time   COLORURINE COLORLESS (A) 08/09/2017 [place] 08/09/2017 2224   LABSPEC 1.004 (L) 08/09/2017 2224   PHURINE 6.0 08/09/2017 2224   GLUCOSEU NEGATIVE 08/09/2017 2224   HGBUR NEGATIVE 08/09/2017 2224   BILIRUBINUR NEGATIVE 08/09/2017 2224   KETONESUR NEGATIVE 08/09/2017 2224   PROTEINUR NEGATIVE 08/09/2017 2224   NITRITE NEGATIVE 08/09/2017 2224   [place] 08/09/2017 2224     STUDIES: Outside studies reviewed  ELIGIBLE FOR AVAILABLE RESEARCH PROTOCOL: no  ASSESSMENT: 67 y.o. [place] woman status post right breast upper outer quadrant biopsy 06/20/2017 for a clinical T1b N0, stage IA invasive ductal carcinoma, grade 3, estrogen and progesterone receptor negative, but HER-2 amplified, with an MIB-1 of 30%  (1) genetics testing 07/05/2017 through the Common Hereditary Cancer Panel offered by [facility] found no deleterious mutations in APC, ATM, AXIN2, BARD1, BMPR1A, BRCA1, BRCA2, BRIP1, CDH1, CDKN2A (p14ARF), CDKN2A (p16INK4a), CKD4, CHEK2, CTNNA1, DICER1, EPCAM (Deletion/duplication testing only), GREM1 (promoter region  deletion/duplication testing only), KIT, MEN1, MLH1, MSH2, MSH3, MSH6, MUTYH, NBN, NF1, NHTL1, PALB2, PDGFRA, PMS2, POLD1, POLE, PTEN, RAD50, RAD51C, RAD51D, SDHB, SDHC, SDHD, SMAD4, SMARCA4. STK11, TP53, TSC1, TSC2, and VHL.  The following genes were evaluated for sequence changes only: SDHA and HOXB13 c.251G>A variant only.  (a) A Variant of uncertain significance in MSH2 was identified c.1331G>T (p.Arg444Leu).   (2) status post right lumpectomy and sentinel lymph node sampling 07/17/2017 for a pT1c pN0, stage IA invasive ductal carcinoma, grade 2, with negative margins.  A total of 5 lymph nodes were removed  (3) adjuvant chemotherapy will consist of paclitaxel weekly x12 together with trastuzumab every 21 days starting 08/07/2017  (4) trastuzumab will be continued to total 1 year  (a) echo on 08/02/17 demonstrates an ejection fraction of 65-70%  5) adjuvant radiation to follow  PLAN: Mally is feeling well today.  She will proceed with treatment today.  Her labs are stable and I reviewed these with her in detail.  She will continue Lisinopril daily.  She has not yet seen cardio oncology and she is expecting this appointment.  I will reach out to them today.    Shaketa will return in one week for labs, f/u and her next treatment.  She knows to call for any questions or concerns prior to her next appointment with [place].    A total of (30) minutes of face-to-face time was spent with this patient with greater than 50% of that time in counseling and care-coordination.    Wilber Bihari, NP 08/21/17 12:08 PM Medical Oncology and Hematology [facility] [address] [place], [place] [address] Tel. [phone]    Fax. [phone]

## 2017-08-28 ENCOUNTER — Other Ambulatory Visit: Payer: Medicare Other

## 2017-08-28 ENCOUNTER — Other Ambulatory Visit: Payer: Self-pay | Admitting: Oncology

## 2017-08-28 ENCOUNTER — Inpatient Hospital Stay (HOSPITAL_BASED_OUTPATIENT_CLINIC_OR_DEPARTMENT_OTHER): Payer: Medicare Other | Admitting: Adult Health

## 2017-08-28 ENCOUNTER — Inpatient Hospital Stay: Payer: Medicare Other

## 2017-08-28 ENCOUNTER — Telehealth: Payer: Self-pay | Admitting: Adult Health

## 2017-08-28 VITALS — BP 161/66 | HR 80 | Temp 98.8°F | Resp 18 | Ht 63.5 in | Wt 154.6 lb

## 2017-08-28 DIAGNOSIS — C50411 Malignant neoplasm of upper-outer quadrant of right female breast: Secondary | ICD-10-CM | POA: Diagnosis not present

## 2017-08-28 DIAGNOSIS — I10 Essential (primary) hypertension: Secondary | ICD-10-CM | POA: Diagnosis not present

## 2017-08-28 DIAGNOSIS — G459 Transient cerebral ischemic attack, unspecified: Secondary | ICD-10-CM

## 2017-08-28 DIAGNOSIS — Z171 Estrogen receptor negative status [ER-]: Secondary | ICD-10-CM

## 2017-08-28 DIAGNOSIS — Z95828 Presence of other vascular implants and grafts: Secondary | ICD-10-CM | POA: Insufficient documentation

## 2017-08-28 DIAGNOSIS — Z5112 Encounter for antineoplastic immunotherapy: Secondary | ICD-10-CM | POA: Diagnosis not present

## 2017-08-28 LAB — CBC WITH DIFFERENTIAL/PLATELET
Basophils Absolute: 0 10*3/uL (ref 0.0–0.1)
Basophils Relative: 1 %
Eosinophils Absolute: 0.1 10*3/uL (ref 0.0–0.5)
Eosinophils Relative: 3 %
HCT: 31.3 % — ABNORMAL LOW (ref 34.8–46.6)
Hemoglobin: 10.2 g/dL — ABNORMAL LOW (ref 11.6–15.9)
Lymphocytes Relative: 40 %
Lymphs Abs: 1.5 10*3/uL (ref 0.9–3.3)
MCH: 29 pg (ref 25.1–34.0)
MCHC: 32.6 g/dL (ref 31.5–36.0)
MCV: 88.9 fL (ref 79.5–101.0)
Monocytes Absolute: 0.3 10*3/uL (ref 0.1–0.9)
Monocytes Relative: 8 %
Neutro Abs: 1.7 10*3/uL (ref 1.5–6.5)
Neutrophils Relative %: 48 %
Platelets: 270 10*3/uL (ref 145–400)
RBC: 3.52 MIL/uL — ABNORMAL LOW (ref 3.70–5.45)
RDW: 13.9 % (ref 11.2–14.5)
WBC: 3.6 10*3/uL — ABNORMAL LOW (ref 3.9–10.3)

## 2017-08-28 LAB — COMPREHENSIVE METABOLIC PANEL
ALT: 21 U/L (ref 0–55)
AST: 16 U/L (ref 5–34)
Albumin: 3.7 g/dL (ref 3.5–5.0)
Alkaline Phosphatase: 73 U/L (ref 40–150)
Anion gap: 7 (ref 3–11)
BUN: 10 mg/dL (ref 7–26)
CO2: 26 mmol/L (ref 22–29)
Calcium: 9.5 mg/dL (ref 8.4–10.4)
Chloride: 105 mmol/L (ref 98–109)
Creatinine, Ser: 0.89 mg/dL (ref 0.60–1.10)
GFR calc Af Amer: >60 mL/min (ref >60)
GFR calc non Af Amer: >60 mL/min (ref >60)
Glucose, Bld: 108 mg/dL (ref 70–140)
Potassium: 4 mmol/L (ref 3.5–5.1)
Sodium: 138 mmol/L (ref 136–145)
Total Bilirubin: 0.2 mg/dL (ref 0.2–1.2)
Total Protein: 7.2 g/dL (ref 6.4–8.3)

## 2017-08-28 MED ORDER — SODIUM CHLORIDE 0.9 % IV SOLN
80.0000 mg/m2 | Freq: Once | INTRAVENOUS | Status: AC
  Administered 2017-08-28: 138 mg via INTRAVENOUS
  Filled 2017-08-28: qty 23

## 2017-08-28 MED ORDER — ACETAMINOPHEN 325 MG PO TABS
ORAL_TABLET | ORAL | Status: AC
  Filled 2017-08-28: qty 2

## 2017-08-28 MED ORDER — SODIUM CHLORIDE 0.9 % IV SOLN
20.0000 mg | Freq: Once | INTRAVENOUS | Status: AC
  Administered 2017-08-28: 20 mg via INTRAVENOUS
  Filled 2017-08-28: qty 2

## 2017-08-28 MED ORDER — HEPARIN SOD (PORK) LOCK FLUSH 100 UNIT/ML IV SOLN
500.0000 [IU] | Freq: Once | INTRAVENOUS | Status: AC | PRN
  Administered 2017-08-28: 500 [IU]
  Filled 2017-08-28: qty 5

## 2017-08-28 MED ORDER — DIPHENHYDRAMINE HCL 50 MG/ML IJ SOLN: 50.0000 mg | Freq: Once | INTRAMUSCULAR | Status: DC

## 2017-08-28 MED ORDER — SODIUM CHLORIDE 0.9 % IV SOLN
Freq: Once | INTRAVENOUS | Status: AC
  Administered 2017-08-28: 12:00:00 via INTRAVENOUS

## 2017-08-28 MED ORDER — FAMOTIDINE IN NACL 20-0.9 MG/50ML-% IV SOLN: 20.0000 mg | Freq: Once | INTRAVENOUS | Status: DC

## 2017-08-28 MED ORDER — DIPHENHYDRAMINE HCL 25 MG PO CAPS
50.0000 mg | ORAL_CAPSULE | Freq: Once | ORAL | Status: AC
  Administered 2017-08-28: 50 mg via ORAL

## 2017-08-28 MED ORDER — DIPHENHYDRAMINE HCL 25 MG PO CAPS
ORAL_CAPSULE | ORAL | Status: AC
  Filled 2017-08-28: qty 2

## 2017-08-28 MED ORDER — SODIUM CHLORIDE 0.9% FLUSH
10.0000 mL | INTRAVENOUS | Status: DC | PRN
  Administered 2017-08-28: 10 mL
  Filled 2017-08-28: qty 10

## 2017-08-28 MED ORDER — TRASTUZUMAB CHEMO 150 MG IV SOLR
450.0000 mg | Freq: Once | INTRAVENOUS | Status: AC
  Administered 2017-08-28: 450 mg via INTRAVENOUS
  Filled 2017-08-28: qty 21.43

## 2017-08-28 MED ORDER — ACETAMINOPHEN 325 MG PO TABS
650.0000 mg | ORAL_TABLET | Freq: Once | ORAL | Status: AC
  Administered 2017-08-28: 650 mg via ORAL

## 2017-08-28 MED ORDER — SODIUM CHLORIDE 0.9 % IV SOLN: Freq: Once | INTRAVENOUS | Status: DC

## 2017-08-28 NOTE — Patient Instructions (Signed)
[facility] Discharge Instructions for Patients Receiving Chemotherapy  Today you received the following chemotherapy agents Taxol & Herceptin.  To help prevent nausea and vomiting after your treatment, we encourage you to take your nausea medication as directed.   If you develop nausea and vomiting that is not controlled by your nausea medication, call the clinic.   BELOW ARE SYMPTOMS THAT SHOULD BE REPORTED IMMEDIATELY:  *FEVER GREATER THAN 100.5 F  *CHILLS WITH OR WITHOUT FEVER  NAUSEA AND VOMITING THAT IS NOT CONTROLLED WITH YOUR NAUSEA MEDICATION  *UNUSUAL SHORTNESS OF BREATH  *UNUSUAL BRUISING OR BLEEDING  TENDERNESS IN MOUTH AND THROAT WITH OR WITHOUT PRESENCE OF ULCERS  *URINARY PROBLEMS  *BOWEL PROBLEMS  UNUSUAL RASH Items with * indicate a potential emergency and should be followed up as soon as possible.  Feel free to call the clinic should you have any questions or concerns. The clinic phone number is [phone].  Please show the [facility] at check-in to the Emergency Department and triage nurse.

## 2017-08-28 NOTE — Progress Notes (Signed)
April [name]  [phone]) [phone] [phone]) [phone]     ID: April [name] DOB: 07/25/1950  MR#: [id number]  QPY#:[id number]  Patient Care Team: Kelton Pillar, MD as PCP - General (Family Medicine) Magrinat, Virgie Dad, MD as Consulting Physician (Oncology) Rolm Bookbinder, MD as Consulting Physician (General Surgery) Eppie Gibson, MD as Attending Physician (Radiation Oncology) Newt Minion, MD as Consulting Physician (Orthopedic Surgery) Regal, Tamala Fothergill, DPM as Consulting Physician (Podiatry) Neldon Mc, Donnamarie Poag, MD as Consulting Physician (Allergy and Immunology) Scot Dock, NP OTHER MD:  CHIEF COMPLAINT: Estrogen receptor negative breast cancer  CURRENT TREATMENT: Adjuvant chemotherapy/immunotherapy   HISTORY OF CURRENT ILLNESS: From the original intake note:  "April [name]" had bilateral screening mammography at [facility] 06/06/2017.  This showed a possible mass in the right breast at the 11 o'clock position.  On 06/13/2017 she underwent right diagnostic mammography and ultrasonography.  Breast density was category C.  In the right breast at the 11 o'clock position there was a 1 cm area by mammography.  By ultrasound this confirmed a 1.0 cm irregular mass with lobulated margins in the upper outer quadrant of the right breast.  There was a second, 0.4 cm lobulated mass in the same quadrant.  The right axilla was sonographically benign.  On 06/20/2017 biopsy of the 2 right breast masses in question was performed.  The final pathology (SAA [id number]) found the smaller mass to be only fibrocystic change.  This is felt to be concordant.  The larger mass however was an invasive ductal carcinoma, grade 3, estrogen and progesterone receptor negative, with an MIB-1 of 30%, and HER-2 amplified, with a signals ratio of 2.24.  The number per cell was 4.60.  The patient's subsequent history is as detailed below.  INTERVAL HISTORY: April [name] returns to the office today for  follow up of her estrogen receptor negative breast cancer, Her-2 positive breast cancer.  Shecontinues on adjuvant chemotherapy for her HER-2 positive breast cancer, with Paclitaxel given weekly and Trastuzumab given every 3 weeks.  She is tolerating that well.   REVIEW OF SYSTEMS: April [name] is feeling well today.  She continues on Lisinopril for her blood pressure.  Her BP is not at goal, and she saw her PCP recently and the Lisinopril was increased to 90m daily.  A few weeks ago, DCintyahad a TIA.  She has not had any further episodes since then.  She denies any fevers, chills, chest pain, shortness of breath, swelling, peripheral neuropathy, or any other concerns.  A detailed ROS was non contributory today.      PAST MEDICAL HISTORY: Past Medical History:  Diagnosis Date  . Breast cancer ([facility] 05/2017   right breast  . Eczema   . Family history of breast cancer   . Family history of ovarian cancer   . Hypertension    toxemia during pregnancy, no meds now    PAST SURGICAL HISTORY: Past Surgical History:  Procedure Laterality Date  . ABDOMINAL HYSTERECTOMY    . BREAST LUMPECTOMY WITH RADIOACTIVE SEED AND SENTINEL LYMPH NODE BIOPSY Right 07/17/2017   Procedure: BREAST LUMPECTOMY WITH RADIOACTIVE SEED AND SENTINEL LYMPH NODE BIOPSY;  Surgeon: WRolm Bookbinder MD;  [place] [facility]  Service: General;  Laterality: Right;  . CESAREAN SECTION     x4  . DILATION AND CURETTAGE OF UTERUS    . KNEE SURGERY Right   . PORTACATH PLACEMENT N/A 07/17/2017   Procedure: INSERTION PORT-A-CATH WITH [place]  Surgeon: WRolm Bookbinder MD;  Location:  [place];  Service: General;  Laterality: N/A;    FAMILY HISTORY Family History  Problem Relation Age of Onset  . Breast cancer Mother 61       again at 41 in other breast   . Heart attack Father 77  . Breast cancer Sister 62  . Breast cancer Maternal Grandmother 47       spread to lungs, died at 33  . Ovarian  cancer Cousin 7  The patient's father died at age 53 from a heart attack.  The patient's mother is currently living at age 68 (as of January 2019).  The patient had 1 sister who was diagnosed with breast cancer at age 55 and died from metastatic disease at age 17.  The patient has 1 brother.  In addition the patient's mother was diagnosed with breast cancer at age 49, on the left side, and now has a right-sided breast cancer diagnosed in January 2019.  There is in addition a cousin with ovarian cancer diagnosed when she was 68 years old  GYNECOLOGIC HISTORY:  No LMP recorded. Patient has had a hysterectomy. Menarche age 46, first live birth age 27, the patient had 3 live births, 1 of whom survived only 2 days.  She underwent hysterectomy without salpingo-oophorectomy September 05, 1978.  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  She is now retired.  Her husband April [name] his Theme park manager at [employer].  The patient's daughter April [name] lives in [place] where she is an Tourist information centre manager.  The patient's daughter April [name] lives in [place] working for the department of defense.  The patient has 3 grandchildren.     ADVANCED DIRECTIVES: Not in place   HEALTH MAINTENANCE: Social History   Tobacco Use  . Smoking status: Never Smoker  . Smokeless tobacco: Never Used  Substance Use Topics  . Alcohol use: Yes    Comment: social  . Drug use: No     Colonoscopy: April 2018/[facility]  PAP: Status post hysterectomy  Bone density:   Allergies  Allergen Reactions  . Tape Itching and Rash  . Nsaids   . Sulfa Antibiotics Rash    Current Outpatient Medications  Medication Sig Dispense Refill  . aspirin 81 MG chewable tablet Chew 4 tablets (324 mg total) by mouth daily. (Patient taking differently: Chew 81 mg by mouth daily. ) 30 tablet 0  . CALCIUM-VITAMIN D  PO Take 1 tablet by mouth daily.    . Coenzyme Q10 (COQ10 PO) Take by mouth daily.    Marland Kitchen lidocaine-prilocaine (EMLA) cream Apply to affected area once (Patient taking differently: Apply 1 application topically as needed (for pain). ) 30 g 3  . lisinopril (PRINIVIL,ZESTRIL) 5 MG tablet Take 1 tablet (5 mg total) by mouth daily. (Patient taking differently: Take 10 mg by mouth daily. ) 30 tablet 0  . LORazepam (ATIVAN) 0.5 MG tablet Take 1 tablet (0.5 mg total) by mouth every 6 (six) hours as needed (Nausea or vomiting). 30 tablet 0  . ondansetron (ZOFRAN) 8 MG tablet Take 1 tablet (8 mg total) by mouth 2 (two) times daily as needed (Nausea or vomiting). 30 tablet 1  . pravastatin (PRAVACHOL) 80 MG tablet Take 1 tablet (80 mg total) by mouth daily at 6 PM. 60 tablet 0  . prochlorperazine (COMPAZINE) 10 MG tablet Take 1 tablet (10 mg total)  by mouth every 6 (six) hours as needed (Nausea or vomiting). 30 tablet 1  . vitamin B-12 (CYANOCOBALAMIN) 1000 MCG tablet Take 1,000 mcg by mouth daily.     No current facility-administered medications for this visit.     OBJECTIVE:   Vitals:   08/28/17 1112  BP: (!) 161/66  Pulse: 80  Resp: 18  Temp: 98.8 F (37.1 C)  SpO2: 99%     Body mass index is 26.96 kg/m.   Wt Readings from Last 3 Encounters:  08/28/17 154 lb 9.6 oz (70.1 kg)  08/21/17 154 lb 4.8 oz (70 kg)  08/14/17 153 lb 12.8 oz (69.8 kg)  ECOG FS:0 - Asymptomatic GENERAL: Patient is a well appearing female in no acute distress HEENT:  Sclerae anicteric.  Oropharynx clear and moist. No ulcerations or evidence of oropharyngeal candidiasis. Neck is supple.  NODES:  No cervical, supraclavicular, or axillary lymphadenopathy palpated.  BREAST EXAM:  Right lumpectomy site healing well, no sign of infection noted LUNGS:  Clear to auscultation bilaterally.  No wheezes or rhonchi. HEART:  Regular rate and rhythm. No murmur appreciated. ABDOMEN:  Soft, nontender.  Positive, normoactive bowel  sounds. No organomegaly palpated. MSK:  No focal spinal tenderness to palpation. Full range of motion bilaterally in the upper extremities. EXTREMITIES:  No peripheral edema.   SKIN:  Clear with no obvious rashes or skin changes. No nail dyscrasia. Circular healing skin lesion, no erythema, scabbing noted NEURO:  Nonfocal. Well oriented.  Appropriate affect.     LAB RESULTS:  CMP     Component Value Date/Time   NA 138 08/28/2017 1011   K 4.0 08/28/2017 1011   CL 105 08/28/2017 1011   CO2 26 08/28/2017 1011   GLUCOSE 108 08/28/2017 1011   BUN 10 08/28/2017 1011   CREATININE 0.89 08/28/2017 1011   CREATININE 1.01 06/27/2017 0843   CALCIUM 9.5 08/28/2017 1011   PROT 7.2 08/28/2017 1011   ALBUMIN 3.7 08/28/2017 1011   AST 16 08/28/2017 1011   AST 16 06/27/2017 0843   ALT 21 08/28/2017 1011   ALT 14 06/27/2017 0843   ALKPHOS 73 08/28/2017 1011   BILITOT 0.2 08/28/2017 1011   BILITOT 0.2 06/27/2017 0843   GFRNONAA >60 08/28/2017 1011   GFRNONAA 56 (L) 06/27/2017 0843   GFRAA >60 08/28/2017 1011   GFRAA >60 06/27/2017 0843    No results found for: TOTALPROTELP, ALBUMINELP, A1GS, A2GS, BETS, BETA2SER, GAMS, MSPIKE, SPEI  No results found for: KPAFRELGTCHN, LAMBDASER, KAPLAMBRATIO  Lab Results  Component Value Date   WBC 3.6 (L) 08/28/2017   NEUTROABS 1.7 08/28/2017   HGB 10.2 (L) 08/28/2017   HCT 31.3 (L) 08/28/2017   MCV 88.9 08/28/2017   PLT 270 08/28/2017    '@LASTCHEMISTRY' @  No results found for: LABCA2  No components found for: WGYKZL935  No results for input(s): INR in the last 168 hours.  No results found for: LABCA2  No results found for: TSV779  No results found for: TJQ300  No results found for: PQZ300  No results found for: CA2729  No components found for: HGQUANT  No results found for: CEA1 / No results found for: CEA1   No results found for: AFPTUMOR  No results found for: CHROMOGRNA  No results found for: PSA1  Appointment on  08/28/2017  Component Date Value Ref Range Status  . Sodium 08/28/2017 138  136 - 145 mmol/L Final  . Potassium 08/28/2017 4.0  3.5 - 5.1 mmol/L Final  . Chloride  08/28/2017 105  98 - 109 mmol/L Final  . CO2 08/28/2017 26  22 - 29 mmol/L Final  . Glucose, Bld 08/28/2017 108  70 - 140 mg/dL Final  . BUN 08/28/2017 10  7 - 26 mg/dL Final  . Creatinine, Ser 08/28/2017 0.89  0.60 - 1.10 mg/dL Final  . Calcium 08/28/2017 9.5  8.4 - 10.4 mg/dL Final  . Total Protein 08/28/2017 7.2  6.4 - 8.3 g/dL Final  . Albumin 08/28/2017 3.7  3.5 - 5.0 g/dL Final  . AST 08/28/2017 16  5 - 34 U/L Final  . ALT 08/28/2017 21  0 - 55 U/L Final  . Alkaline Phosphatase 08/28/2017 73  40 - 150 U/L Final  . Total Bilirubin 08/28/2017 0.2  0.2 - 1.2 mg/dL Final  . GFR calc non Af Amer 08/28/2017 >60  >60 mL/min Final  . GFR calc Af Amer 08/28/2017 >60  >60 mL/min Final   Comment: (NOTE) The eGFR has been calculated using the CKD EPI equation. This calculation has not been validated in all clinical situations. eGFR's persistently <60 mL/min signify possible Chronic Kidney Disease.   Georgiann Hahn gap 08/28/2017 7  3 - 11 Final   Performed at [facility], [place] [address], [place], [place] [address]  . WBC 08/28/2017 3.6* 3.9 - 10.3 K/uL Final  . RBC 08/28/2017 3.52* 3.70 - 5.45 MIL/uL Final  . Hemoglobin 08/28/2017 10.2* 11.6 - 15.9 g/dL Final  . HCT 08/28/2017 31.3* 34.8 - 46.6 % Final  . MCV 08/28/2017 88.9  79.5 - 101.0 fL Final  . MCH 08/28/2017 29.0  25.1 - 34.0 pg Final  . MCHC 08/28/2017 32.6  31.5 - 36.0 g/dL Final  . RDW 08/28/2017 13.9  11.2 - 14.5 % Final  . Platelets 08/28/2017 270  145 - 400 K/uL Final  . Neutrophils Relative % 08/28/2017 48  % Final  . Neutro Abs 08/28/2017 1.7  1.5 - 6.5 K/uL Final  . Lymphocytes Relative 08/28/2017 40  % Final  . Lymphs Abs 08/28/2017 1.5  0.9 - 3.3 K/uL Final  . Monocytes Relative 08/28/2017 8  % Final  . Monocytes Absolute 08/28/2017 0.3   0.1 - 0.9 K/uL Final  . Eosinophils Relative 08/28/2017 3  % Final  . Eosinophils Absolute 08/28/2017 0.1  0.0 - 0.5 K/uL Final  . Basophils Relative 08/28/2017 1  % Final  . Basophils Absolute 08/28/2017 0.0  0.0 - 0.1 K/uL Final   Performed at [facility], [address] [place], [place] [address]    (this displays the last labs from the last 3 days)  No results found for: TOTALPROTELP, ALBUMINELP, A1GS, A2GS, BETS, BETA2SER, GAMS, MSPIKE, SPEI (this displays SPEP labs)  No results found for: KPAFRELGTCHN, LAMBDASER, KAPLAMBRATIO (kappa/lambda light chains)  No results found for: HGBA, HGBA2QUANT, HGBFQUANT, HGBSQUAN (Hemoglobinopathy evaluation)   No results found for: LDH  No results found for: IRON, TIBC, IRONPCTSAT (Iron and TIBC)  No results found for: FERRITIN  Urinalysis    Component Value Date/Time   COLORURINE COLORLESS (A) 08/09/2017 [place] 08/09/2017 2224   LABSPEC 1.004 (L) 08/09/2017 2224   PHURINE 6.0 08/09/2017 [place] 08/09/2017 2224   HGBUR NEGATIVE 08/09/2017 2224   BILIRUBINUR NEGATIVE 08/09/2017 2224   KETONESUR NEGATIVE 08/09/2017 2224   PROTEINUR NEGATIVE 08/09/2017 2224   NITRITE NEGATIVE 08/09/2017 2224   LEUKOCYTESUR NEGATIVE 08/09/2017 2224     STUDIES: Outside studies reviewed  ELIGIBLE FOR AVAILABLE RESEARCH PROTOCOL: no  ASSESSMENT: 67 y.o. [place] woman status post right breast upper outer quadrant biopsy 06/20/2017 for a clinical T1b N0, stage IA invasive ductal carcinoma, grade 3, estrogen and progesterone receptor negative, but HER-2 amplified, with an MIB-1 of 30%  (1) genetics testing 07/05/2017 through the Common Hereditary Cancer Panel offered by [facility] found no deleterious mutations in APC, ATM, AXIN2, BARD1, BMPR1A, BRCA1, BRCA2, BRIP1, CDH1, CDKN2A (p14ARF), CDKN2A (p16INK4a), CKD4, CHEK2, CTNNA1, DICER1, EPCAM (Deletion/duplication testing  only), GREM1 (promoter region deletion/duplication testing only), KIT, MEN1, MLH1, MSH2, MSH3, MSH6, MUTYH, NBN, NF1, NHTL1, PALB2, PDGFRA, PMS2, POLD1, POLE, PTEN, RAD50, RAD51C, RAD51D, SDHB, SDHC, SDHD, SMAD4, SMARCA4. STK11, TP53, TSC1, TSC2, and VHL.  The following genes were evaluated for sequence changes only: SDHA and HOXB13 c.251G>A variant only.  (a) A Variant of uncertain significance in MSH2 was identified c.1331G>T (p.Arg444Leu).   (2) status post right lumpectomy and sentinel lymph node sampling 07/17/2017 for a pT1c pN0, stage IA invasive ductal carcinoma, grade 2, with negative margins.  A total of 5 lymph nodes were removed  (3) adjuvant chemotherapy will consist of paclitaxel weekly x12 together with trastuzumab every 21 days starting 08/07/2017  (4) trastuzumab will be continued to total 1 year  (a) echo on 08/02/17 demonstrates an ejection fraction of 65-70%  (b) echo on 08/10/2017 (pt had TIA) demonstrates an ejection fraction of 65-70%  5) adjuvant radiation to follow  PLAN:  Kinesha is doing well today.  Her labs are stable and she will proceed with treatment today with both Paclitaxel and Trastuzumab.  She verbalizes understanding of this.  We called over to Dr. Haroldine Laws and Dr. Claris Gladden office for her to be seen as new patient.  She will f/u with them accordingly.  She will return weekly for treatment, and we will see her with every other treatment.  She knows to call for any questions or concerns prior to her next appointment with [place].    A total of (20) minutes of face-to-face time was spent with this patient with greater than 50% of that time in counseling and care-coordination.    Wilber Bihari, NP 08/29/17 9:52 AM Medical Oncology and Hematology [facility] [address] [place], [place] [address] Tel. [phone]    Fax. [phone]

## 2017-08-28 NOTE — Telephone Encounter (Signed)
Gave avs and calendar for march °

## 2017-08-29 ENCOUNTER — Encounter: Payer: Self-pay | Admitting: Adult Health

## 2017-08-30 ENCOUNTER — Telehealth (HOSPITAL_COMMUNITY): Payer: Self-pay | Admitting: *Deleted

## 2017-08-30 DIAGNOSIS — G459 Transient cerebral ischemic attack, unspecified: Secondary | ICD-10-CM

## 2017-08-30 DIAGNOSIS — C50411 Malignant neoplasm of upper-outer quadrant of right female breast: Secondary | ICD-10-CM

## 2017-08-30 NOTE — Telephone Encounter (Signed)
Pt referred over to cardio/onc clinic for br ca and recent TIA, per Dr Haroldine Laws ok to schedule for May with echo w/bubble study as she is f/u with neuro.  Order in

## 2017-08-31 ENCOUNTER — Telehealth (HOSPITAL_COMMUNITY): Payer: Self-pay | Admitting: Vascular Surgery

## 2017-08-31 NOTE — Telephone Encounter (Signed)
Left pt message to make NEW brst appt w. Echo w/ bubble study w/ db

## 2017-09-03 ENCOUNTER — Telehealth (HOSPITAL_COMMUNITY): Payer: Self-pay | Admitting: Vascular Surgery

## 2017-09-03 NOTE — Telephone Encounter (Signed)
Left pt second message to may New brst appt w/ echo w/ bubble in May w/ db

## 2017-09-04 ENCOUNTER — Inpatient Hospital Stay: Payer: Medicare Other

## 2017-09-04 ENCOUNTER — Other Ambulatory Visit: Payer: Medicare Other

## 2017-09-04 VITALS — BP 169/77 | HR 69 | Temp 98.1°F | Resp 17

## 2017-09-04 DIAGNOSIS — Z95828 Presence of other vascular implants and grafts: Secondary | ICD-10-CM

## 2017-09-04 DIAGNOSIS — C50411 Malignant neoplasm of upper-outer quadrant of right female breast: Secondary | ICD-10-CM

## 2017-09-04 DIAGNOSIS — Z171 Estrogen receptor negative status [ER-]: Principal | ICD-10-CM

## 2017-09-04 DIAGNOSIS — Z5112 Encounter for antineoplastic immunotherapy: Secondary | ICD-10-CM | POA: Diagnosis not present

## 2017-09-04 LAB — COMPREHENSIVE METABOLIC PANEL
ALT: 20 U/L (ref 0–55)
AST: 19 U/L (ref 5–34)
Albumin: 3.7 g/dL (ref 3.5–5.0)
Alkaline Phosphatase: 71 U/L (ref 40–150)
Anion gap: 8 (ref 3–11)
BUN: 12 mg/dL (ref 7–26)
CO2: 26 mmol/L (ref 22–29)
Calcium: 9.5 mg/dL (ref 8.4–10.4)
Chloride: 107 mmol/L (ref 98–109)
Creatinine, Ser: 0.85 mg/dL (ref 0.60–1.10)
GFR calc Af Amer: >60 mL/min (ref >60)
GFR calc non Af Amer: >60 mL/min (ref >60)
Glucose, Bld: 110 mg/dL (ref 70–140)
Potassium: 3.8 mmol/L (ref 3.5–5.1)
Sodium: 141 mmol/L (ref 136–145)
Total Bilirubin: 0.2 mg/dL (ref 0.2–1.2)
Total Protein: 6.9 g/dL (ref 6.4–8.3)

## 2017-09-04 LAB — CBC WITH DIFFERENTIAL/PLATELET
Basophils Absolute: 0 10*3/uL (ref 0.0–0.1)
Basophils Relative: 0 %
Eosinophils Absolute: 0.1 10*3/uL (ref 0.0–0.5)
Eosinophils Relative: 2 %
HCT: 31.5 % — ABNORMAL LOW (ref 34.8–46.6)
Hemoglobin: 10.2 g/dL — ABNORMAL LOW (ref 11.6–15.9)
Lymphocytes Relative: 38 %
Lymphs Abs: 1.9 10*3/uL (ref 0.9–3.3)
MCH: 28.9 pg (ref 25.1–34.0)
MCHC: 32.4 g/dL (ref 31.5–36.0)
MCV: 89.2 fL (ref 79.5–101.0)
Monocytes Absolute: 0.3 10*3/uL (ref 0.1–0.9)
Monocytes Relative: 5 %
Neutro Abs: 2.6 10*3/uL (ref 1.5–6.5)
Neutrophils Relative %: 55 %
Platelets: 289 10*3/uL (ref 145–400)
RBC: 3.53 MIL/uL — ABNORMAL LOW (ref 3.70–5.45)
RDW: 13.5 % (ref 11.2–14.5)
WBC: 4.9 10*3/uL (ref 3.9–10.3)

## 2017-09-04 MED ORDER — HEPARIN SOD (PORK) LOCK FLUSH 100 UNIT/ML IV SOLN
500.0000 [IU] | Freq: Once | INTRAVENOUS | Status: AC | PRN
  Administered 2017-09-04: 500 [IU]
  Filled 2017-09-04: qty 5

## 2017-09-04 MED ORDER — SODIUM CHLORIDE 0.9 % IV SOLN
20.0000 mg | Freq: Once | INTRAVENOUS | Status: AC
  Administered 2017-09-04: 20 mg via INTRAVENOUS
  Filled 2017-09-04: qty 2

## 2017-09-04 MED ORDER — FAMOTIDINE IN NACL 20-0.9 MG/50ML-% IV SOLN: 20.0000 mg | Freq: Once | INTRAVENOUS | Status: DC

## 2017-09-04 MED ORDER — SODIUM CHLORIDE 0.9 % IV SOLN
Freq: Once | INTRAVENOUS | Status: AC
  Administered 2017-09-04: 12:00:00 via INTRAVENOUS

## 2017-09-04 MED ORDER — DIPHENHYDRAMINE HCL 50 MG/ML IJ SOLN
50.0000 mg | Freq: Once | INTRAMUSCULAR | Status: AC
  Administered 2017-09-04: 50 mg via INTRAVENOUS

## 2017-09-04 MED ORDER — PACLITAXEL CHEMO INJECTION 300 MG/50ML
80.0000 mg/m2 | Freq: Once | INTRAVENOUS | Status: AC
  Administered 2017-09-04: 138 mg via INTRAVENOUS
  Filled 2017-09-04: qty 23

## 2017-09-04 MED ORDER — SODIUM CHLORIDE 0.9% FLUSH
10.0000 mL | INTRAVENOUS | Status: DC | PRN
  Administered 2017-09-04: 10 mL
  Filled 2017-09-04: qty 10

## 2017-09-04 MED ORDER — DIPHENHYDRAMINE HCL 50 MG/ML IJ SOLN
INTRAMUSCULAR | Status: AC
  Filled 2017-09-04: qty 1

## 2017-09-04 NOTE — Patient Instructions (Signed)
[facility] Discharge Instructions for Patients Receiving Chemotherapy  Today you received the following chemotherapy agents:  Taxol.  To help prevent nausea and vomiting after your treatment, we encourage you to take your nausea medication as directed.   If you develop nausea and vomiting that is not controlled by your nausea medication, call the clinic.   BELOW ARE SYMPTOMS THAT SHOULD BE REPORTED IMMEDIATELY:  *FEVER GREATER THAN 100.5 F  *CHILLS WITH OR WITHOUT FEVER  NAUSEA AND VOMITING THAT IS NOT CONTROLLED WITH YOUR NAUSEA MEDICATION  *UNUSUAL SHORTNESS OF BREATH  *UNUSUAL BRUISING OR BLEEDING  TENDERNESS IN MOUTH AND THROAT WITH OR WITHOUT PRESENCE OF ULCERS  *URINARY PROBLEMS  *BOWEL PROBLEMS  UNUSUAL RASH Items with * indicate a potential emergency and should be followed up as soon as possible.  Feel free to call the clinic should you have any questions or concerns. The clinic phone number is [phone].  Please show the CHEMO ALERT CARD at check-in to the Emergency Department and triage nurse.   

## 2017-09-11 ENCOUNTER — Encounter: Payer: Self-pay | Admitting: Adult Health

## 2017-09-11 ENCOUNTER — Other Ambulatory Visit: Payer: Medicare Other

## 2017-09-11 ENCOUNTER — Inpatient Hospital Stay: Payer: Medicare Other

## 2017-09-11 ENCOUNTER — Inpatient Hospital Stay (HOSPITAL_BASED_OUTPATIENT_CLINIC_OR_DEPARTMENT_OTHER): Payer: Medicare Other | Admitting: Adult Health

## 2017-09-11 VITALS — BP 151/68 | HR 84 | Temp 98.6°F | Resp 18 | Ht 63.5 in | Wt 153.2 lb

## 2017-09-11 DIAGNOSIS — Z5112 Encounter for antineoplastic immunotherapy: Secondary | ICD-10-CM | POA: Diagnosis not present

## 2017-09-11 DIAGNOSIS — R5383 Other fatigue: Secondary | ICD-10-CM | POA: Diagnosis not present

## 2017-09-11 DIAGNOSIS — C50411 Malignant neoplasm of upper-outer quadrant of right female breast: Secondary | ICD-10-CM

## 2017-09-11 DIAGNOSIS — Z171 Estrogen receptor negative status [ER-]: Secondary | ICD-10-CM | POA: Diagnosis not present

## 2017-09-11 DIAGNOSIS — Z95828 Presence of other vascular implants and grafts: Secondary | ICD-10-CM

## 2017-09-11 LAB — COMPREHENSIVE METABOLIC PANEL
ALT: 20 U/L (ref 0–55)
AST: 17 U/L (ref 5–34)
Albumin: 3.9 g/dL (ref 3.5–5.0)
Alkaline Phosphatase: 73 U/L (ref 40–150)
Anion gap: 8 (ref 3–11)
BUN: 15 mg/dL (ref 7–26)
CO2: 26 mmol/L (ref 22–29)
Calcium: 9.6 mg/dL (ref 8.4–10.4)
Chloride: 106 mmol/L (ref 98–109)
Creatinine, Ser: 0.85 mg/dL (ref 0.60–1.10)
GFR calc Af Amer: >60 mL/min (ref >60)
GFR calc non Af Amer: >60 mL/min (ref >60)
Glucose, Bld: 108 mg/dL (ref 70–140)
Potassium: 4.3 mmol/L (ref 3.5–5.1)
Sodium: 140 mmol/L (ref 136–145)
Total Bilirubin: 0.2 mg/dL (ref 0.2–1.2)
Total Protein: 7.2 g/dL (ref 6.4–8.3)

## 2017-09-11 LAB — CBC WITH DIFFERENTIAL/PLATELET
Basophils Absolute: 0.1 10*3/uL (ref 0.0–0.1)
Basophils Relative: 1 %
Eosinophils Absolute: 0.1 10*3/uL (ref 0.0–0.5)
Eosinophils Relative: 2 %
HCT: 31.1 % — ABNORMAL LOW (ref 34.8–46.6)
Hemoglobin: 10.4 g/dL — ABNORMAL LOW (ref 11.6–15.9)
Lymphocytes Relative: 35 %
Lymphs Abs: 1.6 10*3/uL (ref 0.9–3.3)
MCH: 29.5 pg (ref 25.1–34.0)
MCHC: 33.3 g/dL (ref 31.5–36.0)
MCV: 88.5 fL (ref 79.5–101.0)
Monocytes Absolute: 0.3 10*3/uL (ref 0.1–0.9)
Monocytes Relative: 7 %
Neutro Abs: 2.5 10*3/uL (ref 1.5–6.5)
Neutrophils Relative %: 55 %
Platelets: 309 10*3/uL (ref 145–400)
RBC: 3.52 MIL/uL — ABNORMAL LOW (ref 3.70–5.45)
RDW: 14.4 % (ref 11.2–14.5)
WBC: 4.6 10*3/uL (ref 3.9–10.3)

## 2017-09-11 MED ORDER — SODIUM CHLORIDE 0.9 % IV SOLN
Freq: Once | INTRAVENOUS | Status: AC
  Administered 2017-09-11: 13:00:00 via INTRAVENOUS

## 2017-09-11 MED ORDER — FAMOTIDINE IN NACL 20-0.9 MG/50ML-% IV SOLN
INTRAVENOUS | Status: AC
  Filled 2017-09-11: qty 50

## 2017-09-11 MED ORDER — FAMOTIDINE IN NACL 20-0.9 MG/50ML-% IV SOLN
20.0000 mg | Freq: Once | INTRAVENOUS | Status: AC
  Administered 2017-09-11: 20 mg via INTRAVENOUS

## 2017-09-11 MED ORDER — SODIUM CHLORIDE 0.9 % IV SOLN
80.0000 mg/m2 | Freq: Once | INTRAVENOUS | Status: AC
  Administered 2017-09-11: 138 mg via INTRAVENOUS
  Filled 2017-09-11: qty 23

## 2017-09-11 MED ORDER — SODIUM CHLORIDE 0.9% FLUSH
10.0000 mL | INTRAVENOUS | Status: DC | PRN
  Administered 2017-09-11: 10 mL
  Filled 2017-09-11: qty 10

## 2017-09-11 MED ORDER — DIPHENHYDRAMINE HCL 50 MG/ML IJ SOLN
INTRAMUSCULAR | Status: AC
  Filled 2017-09-11: qty 1

## 2017-09-11 MED ORDER — HEPARIN SOD (PORK) LOCK FLUSH 100 UNIT/ML IV SOLN
500.0000 [IU] | Freq: Once | INTRAVENOUS | Status: AC | PRN
  Administered 2017-09-11: 500 [IU]
  Filled 2017-09-11: qty 5

## 2017-09-11 MED ORDER — DIPHENHYDRAMINE HCL 50 MG/ML IJ SOLN
50.0000 mg | Freq: Once | INTRAMUSCULAR | Status: AC
  Administered 2017-09-11: 50 mg via INTRAVENOUS

## 2017-09-11 MED ORDER — SODIUM CHLORIDE 0.9 % IV SOLN
20.0000 mg | Freq: Once | INTRAVENOUS | Status: AC
  Administered 2017-09-11: 20 mg via INTRAVENOUS
  Filled 2017-09-11: qty 2

## 2017-09-11 NOTE — Patient Instructions (Signed)
[facility] Discharge Instructions for Patients Receiving Chemotherapy  Today you received the following chemotherapy agents: Paclitaxel (Taxol)  To help prevent nausea and vomiting after your treatment, we encourage you to take your nausea medication as prescribed. If you develop nausea and vomiting that is not controlled by your nausea medication, call the clinic.   BELOW ARE SYMPTOMS THAT SHOULD BE REPORTED IMMEDIATELY:  *FEVER GREATER THAN 100.5 F  *CHILLS WITH OR WITHOUT FEVER  NAUSEA AND VOMITING THAT IS NOT CONTROLLED WITH YOUR NAUSEA MEDICATION  *UNUSUAL SHORTNESS OF BREATH  *UNUSUAL BRUISING OR BLEEDING  TENDERNESS IN MOUTH AND THROAT WITH OR WITHOUT PRESENCE OF ULCERS  *URINARY PROBLEMS  *BOWEL PROBLEMS  UNUSUAL RASH Items with * indicate a potential emergency and should be followed up as soon as possible.  Feel free to call the clinic should you have any questions or concerns. The clinic phone number is [phone].  Please show the CHEMO ALERT CARD at check-in to the Emergency Department and triage nurse.   

## 2017-09-11 NOTE — Progress Notes (Signed)
[place]  [phone]) [phone] Fax:(336) [phone]     ID: April [name] DOB: [birth year]  MR#: [id number]  OIN#:[id number]  Patient Care Team: Kelton Pillar, MD as PCP - General (Family Medicine) Magrinat, Virgie Dad, MD as Consulting Physician (Oncology) Rolm Bookbinder, MD as Consulting Physician (General Surgery) Eppie Gibson, MD as Attending Physician (Radiation Oncology) Newt Minion, MD as Consulting Physician (Orthopedic Surgery) Regal, Tamala Fothergill, DPM as Consulting Physician (Podiatry) Neldon Mc, Donnamarie Poag, MD as Consulting Physician (Allergy and Immunology) Scot Dock, NP OTHER MD:  CHIEF COMPLAINT: Estrogen receptor negative breast cancer  CURRENT TREATMENT: Adjuvant chemotherapy/immunotherapy   HISTORY OF CURRENT ILLNESS: From the original intake note:  "April [name]" had bilateral screening mammography at [facility] 06/06/2017.  This showed a possible mass in the right breast at the 11 o'clock position.  On 06/13/2017 she underwent right diagnostic mammography and ultrasonography.  Breast density was category C.  In the right breast at the 11 o'clock position there was a 1 cm area by mammography.  By ultrasound this confirmed a 1.0 cm irregular mass with lobulated margins in the upper outer quadrant of the right breast.  There was a second, 0.4 cm lobulated mass in the same quadrant.  The right axilla was sonographically benign.  On 06/20/2017 biopsy of the 2 right breast masses in question was performed.  The final pathology (SAA [id number]) found the smaller mass to be only fibrocystic change.  This is felt to be concordant.  The larger mass however was an invasive ductal carcinoma, grade 3, estrogen and progesterone receptor negative, with an MIB-1 of 30%, and HER-2 amplified, with a signals ratio of 2.24.  The number per cell was 4.60.  The patient's subsequent history is as detailed below.  INTERVAL HISTORY: April [name] returns to the office today for  follow up of her estrogen receptor negative breast cancer, Her-2 positive breast cancer.  She continues on adjuvant chemotherapy for her HER-2 positive breast cancer, with Paclitaxel given weekly and Trastuzumab given every 3 weeks.   She is due for Paclitaxel today.   REVIEW OF SYSTEMS: April [name] is feeling well today.  She continues to tolerate chemotherapy well.  She denies peripheral neuropathy.  She is getting more fatigued however.  Otherwise, a detailed ROS was conducted and was non contributory today.      PAST MEDICAL HISTORY: Past Medical History:  Diagnosis Date  . Breast cancer ([place]) 05/2017   right breast  . Eczema   . Family history of breast cancer   . Family history of ovarian cancer   . Hypertension    toxemia during pregnancy, no meds now    PAST SURGICAL HISTORY: Past Surgical History:  Procedure Laterality Date  . ABDOMINAL HYSTERECTOMY    . BREAST LUMPECTOMY WITH RADIOACTIVE SEED AND SENTINEL LYMPH NODE BIOPSY Right 07/17/2017   Procedure: BREAST LUMPECTOMY WITH RADIOACTIVE SEED AND SENTINEL LYMPH NODE BIOPSY;  Surgeon: Rolm Bookbinder, MD;  Location: [facility];  Service: General;  Laterality: Right;  . CESAREAN SECTION     x4  . DILATION AND CURETTAGE OF UTERUS    . KNEE SURGERY Right   . PORTACATH PLACEMENT N/A 07/17/2017   Procedure: INSERTION PORT-A-CATH WITH [place];  Surgeon: Rolm Bookbinder, MD;  Location: [facility];  Service: General;  Laterality: N/A;    FAMILY HISTORY Family History  Problem Relation Age of Onset  . Breast cancer Mother 35       again at 57 in other breast   .  Heart attack Father 56  . Breast cancer Sister 61  . Breast cancer Maternal Grandmother 76       spread to lungs, died at 26  . Ovarian cancer Cousin 1  The patient's father died at age 13 from a heart attack.  The patient's mother is currently living at age 102 (as of January 2019).  The patient had 1 sister who was diagnosed with breast  cancer at age 55 and died from metastatic disease at age 69.  The patient has 1 brother.  In addition the patient's mother was diagnosed with breast cancer at age 74, on the left side, and now has a right-sided breast cancer diagnosed in January 2019.  There is in addition a cousin with ovarian cancer diagnosed when she was 68 years old  GYNECOLOGIC HISTORY:  No LMP recorded. Patient has had a hysterectomy. Menarche age 70, first live birth age 49, the patient had 3 live births, 1 of whom survived only 2 days.  She underwent hysterectomy without salpingo-oophorectomy September 05, 1978.  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  She is now retired.  Her husband April [name] his Theme park manager at [place].  The patient's daughter April [name] lives in [place] where she is an Tourist information centre manager.  The patient's daughter April [name] lives in [place] working for the department of defense.  The patient has 3 grandchildren.     ADVANCED DIRECTIVES: Not in place   HEALTH MAINTENANCE: Social History   Tobacco Use  . Smoking status: Never Smoker  . Smokeless tobacco: Never Used  Substance Use Topics  . Alcohol use: Yes    Comment: social  . Drug use: No     Colonoscopy: April 2018/[facility]  PAP: Status post hysterectomy  Bone density:   Allergies  Allergen Reactions  . Tape Itching and Rash  . Nsaids   . Sulfa Antibiotics Rash    Current Outpatient Medications  Medication Sig Dispense Refill  . aspirin 81 MG chewable tablet Chew 4 tablets (324 mg total) by mouth daily. (Patient taking differently: Chew 81 mg by mouth daily. ) 30 tablet 0  . CALCIUM-VITAMIN D PO Take 1 tablet by mouth daily.    . Coenzyme Q10 (COQ10 PO) Take by mouth daily.    Marland Kitchen lidocaine-prilocaine (EMLA) cream Apply to affected area once (Patient taking differently: Apply 1 application  topically as needed (for pain). ) 30 g 3  . lisinopril (PRINIVIL,ZESTRIL) 5 MG tablet Take 1 tablet (5 mg total) by mouth daily. (Patient taking differently: Take 10 mg by mouth daily. ) 30 tablet 0  . LORazepam (ATIVAN) 0.5 MG tablet Take 1 tablet (0.5 mg total) by mouth every 6 (six) hours as needed (Nausea or vomiting). 30 tablet 0  . ondansetron (ZOFRAN) 8 MG tablet Take 1 tablet (8 mg total) by mouth 2 (two) times daily as needed (Nausea or vomiting). 30 tablet 1  . pravastatin (PRAVACHOL) 80 MG tablet Take 1 tablet (80 mg total) by mouth daily at 6 PM. 60 tablet 0  . prochlorperazine (COMPAZINE) 10 MG tablet Take 1 tablet (10 mg total) by mouth every 6 (six) hours as needed (Nausea or vomiting). 30 tablet 1  . vitamin B-12 (CYANOCOBALAMIN) 1000 MCG tablet Take 1,000 mcg by mouth daily.     No current facility-administered medications for this visit.     OBJECTIVE:  Vitals:   09/11/17 1147  BP: (!) 151/68  Pulse: 84  Resp: 18  Temp: 98.6 F (37 C)  SpO2: 100%     Body mass index is 26.71 kg/m.   Wt Readings from Last 3 Encounters:  09/11/17 153 lb 3.2 oz (69.5 kg)  08/28/17 154 lb 9.6 oz (70.1 kg)  08/21/17 154 lb 4.8 oz (70 kg)  ECOG FS:0 - Asymptomatic GENERAL: Patient is a well appearing female in no acute distress HEENT:  Sclerae anicteric.  Oropharynx clear and moist. No ulcerations or evidence of oropharyngeal candidiasis. Neck is supple.  NODES:  No cervical, supraclavicular, or axillary lymphadenopathy palpated.  BREAST EXAM:  Right lumpectomy site healing well, no sign of infection noted LUNGS:  Clear to auscultation bilaterally.  No wheezes or rhonchi. HEART:  Regular rate and rhythm. No murmur appreciated. ABDOMEN:  Soft, nontender.  Positive, normoactive bowel sounds. No organomegaly palpated. MSK:  No focal spinal tenderness to palpation. Full range of motion bilaterally in the upper extremities. EXTREMITIES:  No peripheral edema.   SKIN:  Clear with no obvious  rashes or skin changes. No nail dyscrasia. Circular healing skin lesion, no erythema, scabbing noted NEURO:  Nonfocal. Well oriented.  Appropriate affect.     LAB RESULTS:  CMP     Component Value Date/Time   NA 141 09/04/2017 1027   K 3.8 09/04/2017 1027   CL 107 09/04/2017 1027   CO2 26 09/04/2017 1027   GLUCOSE 110 09/04/2017 1027   BUN 12 09/04/2017 1027   CREATININE 0.85 09/04/2017 1027   CREATININE 1.01 06/27/2017 0843   CALCIUM 9.5 09/04/2017 1027   PROT 6.9 09/04/2017 1027   ALBUMIN 3.7 09/04/2017 1027   AST 19 09/04/2017 1027   AST 16 06/27/2017 0843   ALT 20 09/04/2017 1027   ALT 14 06/27/2017 0843   ALKPHOS 71 09/04/2017 1027   BILITOT 0.2 09/04/2017 1027   BILITOT 0.2 06/27/2017 0843   GFRNONAA >60 09/04/2017 1027   GFRNONAA 56 (L) 06/27/2017 0843   GFRAA >60 09/04/2017 1027   GFRAA >60 06/27/2017 0843    No results found for: TOTALPROTELP, ALBUMINELP, A1GS, A2GS, BETS, BETA2SER, GAMS, MSPIKE, SPEI  No results found for: KPAFRELGTCHN, LAMBDASER, KAPLAMBRATIO  Lab Results  Component Value Date   WBC 4.6 09/11/2017   NEUTROABS 2.5 09/11/2017   HGB 10.4 (L) 09/11/2017   HCT 31.1 (L) 09/11/2017   MCV 88.5 09/11/2017   PLT 309 09/11/2017    _0 @  No results found for: LABCA2  No components found for: [id number]  No results for input(s): INR in the last 168 hours.  No results found for: LABCA2  No results found for: GYF749  No results found for: SWH675  No results found for: FFM384  No results found for: CA2729  No components found for: HGQUANT  No results found for: CEA1 / No results found for: CEA1   No results found for: AFPTUMOR  No results found for: CHROMOGRNA  No results found for: PSA1  Appointment on 09/11/2017  Component Date Value Ref Range Status  . WBC 09/11/2017 4.6  3.9 - 10.3 K/uL Final  . RBC 09/11/2017 3.52* 3.70 - 5.45 MIL/uL Final  . Hemoglobin 09/11/2017 10.4* 11.6 - 15.9 g/dL Final  . HCT  09/11/2017 31.1* 34.8 - 46.6 % Final  . MCV 09/11/2017 88.5  79.5 - 101.0 fL Final  . MCH 09/11/2017 29.5  25.1 - 34.0 pg Final  . MCHC 09/11/2017 33.3  31.5 - 36.0  g/dL Final  . RDW 09/11/2017 14.4  11.2 - 14.5 % Final  . Platelets 09/11/2017 309  145 - 400 K/uL Final  . Neutrophils Relative % 09/11/2017 55  % Final  . Neutro Abs 09/11/2017 2.5  1.5 - 6.5 K/uL Final  . Lymphocytes Relative 09/11/2017 35  % Final  . Lymphs Abs 09/11/2017 1.6  0.9 - 3.3 K/uL Final  . Monocytes Relative 09/11/2017 7  % Final  . Monocytes Absolute 09/11/2017 0.3  0.1 - 0.9 K/uL Final  . Eosinophils Relative 09/11/2017 2  % Final  . Eosinophils Absolute 09/11/2017 0.1  0.0 - 0.5 K/uL Final  . Basophils Relative 09/11/2017 1  % Final  . Basophils Absolute 09/11/2017 0.1  0.0 - 0.1 K/uL Final   Performed at [facility], [address] [place], [place] [address]    (this displays the last labs from the last 3 days)  No results found for: TOTALPROTELP, ALBUMINELP, A1GS, A2GS, BETS, BETA2SER, GAMS, MSPIKE, SPEI (this displays SPEP labs)  No results found for: KPAFRELGTCHN, LAMBDASER, KAPLAMBRATIO (kappa/lambda light chains)  No results found for: HGBA, HGBA2QUANT, HGBFQUANT, HGBSQUAN (Hemoglobinopathy evaluation)   No results found for: LDH  No results found for: IRON, TIBC, IRONPCTSAT (Iron and TIBC)  No results found for: FERRITIN  Urinalysis    Component Value Date/Time   COLORURINE COLORLESS (A) 08/09/2017 2224   APPEARANCEUR CLEAR 08/09/2017 2224   LABSPEC 1.004 (L) 08/09/2017 2224   PHURINE 6.0 08/09/2017 2224   GLUCOSEU NEGATIVE 08/09/2017 2224   HGBUR NEGATIVE 08/09/2017 2224   BILIRUBINUR NEGATIVE 08/09/2017 2224   KETONESUR NEGATIVE 08/09/2017 2224   PROTEINUR NEGATIVE 08/09/2017 2224   NITRITE NEGATIVE 08/09/2017 2224   LEUKOCYTESUR NEGATIVE 08/09/2017 2224     STUDIES: Outside studies reviewed  ELIGIBLE FOR AVAILABLE RESEARCH PROTOCOL:  no  ASSESSMENT: 67 y.o. [place] woman status post right breast upper outer quadrant biopsy 06/20/2017 for a clinical T1b N0, stage IA invasive ductal carcinoma, grade 3, estrogen and progesterone receptor negative, but HER-2 amplified, with an MIB-1 of 30%  (1) genetics testing 07/05/2017 through the Common Hereditary Cancer Panel offered by [facility] found no deleterious mutations in APC, ATM, AXIN2, BARD1, BMPR1A, BRCA1, BRCA2, BRIP1, CDH1, CDKN2A (p14ARF), CDKN2A (p16INK4a), CKD4, CHEK2, CTNNA1, DICER1, EPCAM (Deletion/duplication testing only), GREM1 (promoter region deletion/duplication testing only), KIT, MEN1, MLH1, MSH2, MSH3, MSH6, MUTYH, NBN, NF1, NHTL1, PALB2, PDGFRA, PMS2, POLD1, POLE, PTEN, RAD50, RAD51C, RAD51D, SDHB, SDHC, SDHD, SMAD4, SMARCA4. STK11, TP53, TSC1, TSC2, and VHL.  The following genes were evaluated for sequence changes only: SDHA and HOXB13 c.251G>A variant only.  (a) A Variant of uncertain significance in MSH2 was identified c.1331G>T (p.Arg444Leu).   (2) status post right lumpectomy and sentinel lymph node sampling 07/17/2017 for a pT1c pN0, stage IA invasive ductal carcinoma, grade 2, with negative margins.  A total of 5 lymph nodes were removed  (3) adjuvant chemotherapy will consist of paclitaxel weekly x12 together with trastuzumab every 21 days starting 08/07/2017  (4) trastuzumab will be continued to total 1 year  (a) echo on 08/02/17 demonstrates an ejection fraction of 65-70%  (b) echo on 08/10/2017 (pt had TIA) demonstrates an ejection fraction of 65-70%  5) adjuvant radiation to follow  PLAN:  April [name] is doing well today.  She continues to tolerate her treatment well.  She is due for Paclitaxel today and will proceed with this.  Her cbc is stable and I reviewed this with her.  We reviewed her  fatigue, and the fact that it may become cumulative at this point.  She understands..  She is managing it well and continues to be active.  She will  return weekly for treatment and every other week to see myself or Dr. Jana Hakim.  She knows to call for any questions or concerns prior to her next appointment with [place].     A total of (20) minutes of face-to-face time was spent with this patient with greater than 50% of that time in counseling and care-coordination.    Wilber Bihari, NP 09/11/17 11:54 AM Medical Oncology and Hematology [facility] [address] [place], [place] [address] Tel. [phone]    Fax. [phone]

## 2017-09-12 ENCOUNTER — Telehealth: Payer: Self-pay | Admitting: Adult Health

## 2017-09-12 NOTE — Telephone Encounter (Signed)
Per 3/26 no los 

## 2017-09-18 ENCOUNTER — Other Ambulatory Visit: Payer: Medicare Other

## 2017-09-18 ENCOUNTER — Inpatient Hospital Stay: Payer: Medicare Other

## 2017-09-18 ENCOUNTER — Inpatient Hospital Stay: Payer: Medicare Other | Attending: Oncology

## 2017-09-18 VITALS — BP 148/70 | HR 81 | Temp 98.3°F | Resp 17

## 2017-09-18 DIAGNOSIS — C50411 Malignant neoplasm of upper-outer quadrant of right female breast: Secondary | ICD-10-CM | POA: Diagnosis present

## 2017-09-18 DIAGNOSIS — G620 Drug-induced polyneuropathy: Secondary | ICD-10-CM | POA: Diagnosis not present

## 2017-09-18 DIAGNOSIS — R030 Elevated blood-pressure reading, without diagnosis of hypertension: Secondary | ICD-10-CM | POA: Insufficient documentation

## 2017-09-18 DIAGNOSIS — Z5112 Encounter for antineoplastic immunotherapy: Secondary | ICD-10-CM | POA: Insufficient documentation

## 2017-09-18 DIAGNOSIS — Z171 Estrogen receptor negative status [ER-]: Principal | ICD-10-CM

## 2017-09-18 DIAGNOSIS — Z95828 Presence of other vascular implants and grafts: Secondary | ICD-10-CM

## 2017-09-18 DIAGNOSIS — Z5111 Encounter for antineoplastic chemotherapy: Secondary | ICD-10-CM | POA: Insufficient documentation

## 2017-09-18 LAB — CBC WITH DIFFERENTIAL/PLATELET
Basophils Absolute: 0 10*3/uL (ref 0.0–0.1)
Basophils Relative: 1 %
Eosinophils Absolute: 0.1 10*3/uL (ref 0.0–0.5)
Eosinophils Relative: 2 %
HCT: 30.7 % — ABNORMAL LOW (ref 34.8–46.6)
Hemoglobin: 10.2 g/dL — ABNORMAL LOW (ref 11.6–15.9)
Lymphocytes Relative: 35 %
Lymphs Abs: 1.7 10*3/uL (ref 0.9–3.3)
MCH: 29.3 pg (ref 25.1–34.0)
MCHC: 33.2 g/dL (ref 31.5–36.0)
MCV: 88.4 fL (ref 79.5–101.0)
Monocytes Absolute: 0.4 10*3/uL (ref 0.1–0.9)
Monocytes Relative: 8 %
Neutro Abs: 2.7 10*3/uL (ref 1.5–6.5)
Neutrophils Relative %: 54 %
Platelets: 316 10*3/uL (ref 145–400)
RBC: 3.48 MIL/uL — ABNORMAL LOW (ref 3.70–5.45)
RDW: 14.8 % — ABNORMAL HIGH (ref 11.2–14.5)
WBC: 4.9 10*3/uL (ref 3.9–10.3)

## 2017-09-18 LAB — COMPREHENSIVE METABOLIC PANEL
ALT: 19 U/L (ref 0–55)
AST: 17 U/L (ref 5–34)
Albumin: 3.8 g/dL (ref 3.5–5.0)
Alkaline Phosphatase: 69 U/L (ref 40–150)
Anion gap: 8 (ref 3–11)
BUN: 15 mg/dL (ref 7–26)
CO2: 26 mmol/L (ref 22–29)
Calcium: 9.6 mg/dL (ref 8.4–10.4)
Chloride: 107 mmol/L (ref 98–109)
Creatinine, Ser: 0.84 mg/dL (ref 0.60–1.10)
GFR calc Af Amer: >60 mL/min (ref >60)
GFR calc non Af Amer: >60 mL/min (ref >60)
Glucose, Bld: 111 mg/dL (ref 70–140)
Potassium: 4 mmol/L (ref 3.5–5.1)
Sodium: 141 mmol/L (ref 136–145)
Total Bilirubin: 0.3 mg/dL (ref 0.2–1.2)
Total Protein: 6.9 g/dL (ref 6.4–8.3)

## 2017-09-18 MED ORDER — SODIUM CHLORIDE 0.9 % IV SOLN
20.0000 mg | Freq: Once | INTRAVENOUS | Status: AC
  Administered 2017-09-18: 20 mg via INTRAVENOUS
  Filled 2017-09-18: qty 2

## 2017-09-18 MED ORDER — FAMOTIDINE IN NACL 20-0.9 MG/50ML-% IV SOLN
INTRAVENOUS | Status: AC
  Filled 2017-09-18: qty 50

## 2017-09-18 MED ORDER — SODIUM CHLORIDE 0.9 % IV SOLN
Freq: Once | INTRAVENOUS | Status: AC
  Administered 2017-09-18: 13:00:00 via INTRAVENOUS

## 2017-09-18 MED ORDER — SODIUM CHLORIDE 0.9% FLUSH
10.0000 mL | INTRAVENOUS | Status: DC | PRN
  Administered 2017-09-18: 10 mL
  Filled 2017-09-18: qty 10

## 2017-09-18 MED ORDER — DIPHENHYDRAMINE HCL 50 MG/ML IJ SOLN
INTRAMUSCULAR | Status: AC
  Filled 2017-09-18: qty 1

## 2017-09-18 MED ORDER — DIPHENHYDRAMINE HCL 50 MG/ML IJ SOLN
50.0000 mg | Freq: Once | INTRAMUSCULAR | Status: AC
  Administered 2017-09-18: 50 mg via INTRAVENOUS

## 2017-09-18 MED ORDER — TRASTUZUMAB CHEMO 150 MG IV SOLR
450.0000 mg | Freq: Once | INTRAVENOUS | Status: AC
  Administered 2017-09-18: 450 mg via INTRAVENOUS
  Filled 2017-09-18: qty 21.43

## 2017-09-18 MED ORDER — DIPHENHYDRAMINE HCL 25 MG PO CAPS: 50.0000 mg | ORAL_CAPSULE | Freq: Once | ORAL | Status: DC

## 2017-09-18 MED ORDER — FAMOTIDINE IN NACL 20-0.9 MG/50ML-% IV SOLN
20.0000 mg | Freq: Once | INTRAVENOUS | Status: AC
  Administered 2017-09-18: 20 mg via INTRAVENOUS

## 2017-09-18 MED ORDER — ACETAMINOPHEN 325 MG PO TABS
ORAL_TABLET | ORAL | Status: AC
  Filled 2017-09-18: qty 2

## 2017-09-18 MED ORDER — SODIUM CHLORIDE 0.9 % IV SOLN
80.0000 mg/m2 | Freq: Once | INTRAVENOUS | Status: AC
  Administered 2017-09-18: 138 mg via INTRAVENOUS
  Filled 2017-09-18: qty 23

## 2017-09-18 MED ORDER — HEPARIN SOD (PORK) LOCK FLUSH 100 UNIT/ML IV SOLN
500.0000 [IU] | Freq: Once | INTRAVENOUS | Status: AC | PRN
  Administered 2017-09-18: 500 [IU]
  Filled 2017-09-18: qty 5

## 2017-09-18 MED ORDER — ACETAMINOPHEN 325 MG PO TABS
650.0000 mg | ORAL_TABLET | Freq: Once | ORAL | Status: AC
  Administered 2017-09-18: 650 mg via ORAL

## 2017-09-18 NOTE — Patient Instructions (Signed)
[facility] Discharge Instructions for Patients Receiving Chemotherapy  Today you received the following chemotherapy agents Taxol & Herceptin.  To help prevent nausea and vomiting after your treatment, we encourage you to take your nausea medication as directed.   If you develop nausea and vomiting that is not controlled by your nausea medication, call the clinic.   BELOW ARE SYMPTOMS THAT SHOULD BE REPORTED IMMEDIATELY:  *FEVER GREATER THAN 100.5 F  *CHILLS WITH OR WITHOUT FEVER  NAUSEA AND VOMITING THAT IS NOT CONTROLLED WITH YOUR NAUSEA MEDICATION  *UNUSUAL SHORTNESS OF BREATH  *UNUSUAL BRUISING OR BLEEDING  TENDERNESS IN MOUTH AND THROAT WITH OR WITHOUT PRESENCE OF ULCERS  *URINARY PROBLEMS  *BOWEL PROBLEMS  UNUSUAL RASH Items with * indicate a potential emergency and should be followed up as soon as possible.  Feel free to call the clinic should you have any questions or concerns. The clinic phone number is [phone].  Please show the [facility] at check-in to the Emergency Department and triage nurse.

## 2017-09-25 ENCOUNTER — Inpatient Hospital Stay: Payer: Medicare Other

## 2017-09-25 ENCOUNTER — Inpatient Hospital Stay (HOSPITAL_BASED_OUTPATIENT_CLINIC_OR_DEPARTMENT_OTHER): Payer: Medicare Other | Admitting: Adult Health

## 2017-09-25 ENCOUNTER — Other Ambulatory Visit: Payer: Medicare Other

## 2017-09-25 ENCOUNTER — Telehealth: Payer: Self-pay

## 2017-09-25 ENCOUNTER — Encounter: Payer: Self-pay | Admitting: Adult Health

## 2017-09-25 ENCOUNTER — Ambulatory Visit: Payer: Medicare Other

## 2017-09-25 VITALS — BP 172/80 | HR 90 | Temp 98.5°F | Resp 18 | Ht 63.5 in | Wt 155.1 lb

## 2017-09-25 DIAGNOSIS — C50411 Malignant neoplasm of upper-outer quadrant of right female breast: Secondary | ICD-10-CM | POA: Diagnosis not present

## 2017-09-25 DIAGNOSIS — Z171 Estrogen receptor negative status [ER-]: Principal | ICD-10-CM

## 2017-09-25 DIAGNOSIS — G620 Drug-induced polyneuropathy: Secondary | ICD-10-CM

## 2017-09-25 DIAGNOSIS — Z5112 Encounter for antineoplastic immunotherapy: Secondary | ICD-10-CM | POA: Diagnosis not present

## 2017-09-25 LAB — COMPREHENSIVE METABOLIC PANEL
ALT: 18 U/L (ref 0–55)
AST: 17 U/L (ref 5–34)
Albumin: 3.8 g/dL (ref 3.5–5.0)
Alkaline Phosphatase: 72 U/L (ref 40–150)
Anion gap: 8 (ref 3–11)
BUN: 14 mg/dL (ref 7–26)
CO2: 25 mmol/L (ref 22–29)
Calcium: 9.6 mg/dL (ref 8.4–10.4)
Chloride: 107 mmol/L (ref 98–109)
Creatinine, Ser: 0.83 mg/dL (ref 0.60–1.10)
GFR calc Af Amer: >60 mL/min (ref >60)
GFR calc non Af Amer: >60 mL/min (ref >60)
Glucose, Bld: 101 mg/dL (ref 70–140)
Potassium: 4.1 mmol/L (ref 3.5–5.1)
Sodium: 140 mmol/L (ref 136–145)
Total Bilirubin: <0.2 mg/dL — ABNORMAL LOW (ref 0.2–1.2)
Total Protein: 7.1 g/dL (ref 6.4–8.3)

## 2017-09-25 LAB — CBC WITH DIFFERENTIAL/PLATELET
Basophils Absolute: 0 10*3/uL (ref 0.0–0.1)
Basophils Relative: 1 %
Eosinophils Absolute: 0.1 10*3/uL (ref 0.0–0.5)
Eosinophils Relative: 2 %
HCT: 30.4 % — ABNORMAL LOW (ref 34.8–46.6)
Hemoglobin: 9.8 g/dL — ABNORMAL LOW (ref 11.6–15.9)
Lymphocytes Relative: 31 %
Lymphs Abs: 1.5 10*3/uL (ref 0.9–3.3)
MCH: 29 pg (ref 25.1–34.0)
MCHC: 32.2 g/dL (ref 31.5–36.0)
MCV: 89.9 fL (ref 79.5–101.0)
Monocytes Absolute: 0.4 10*3/uL (ref 0.1–0.9)
Monocytes Relative: 8 %
Neutro Abs: 2.9 10*3/uL (ref 1.5–6.5)
Neutrophils Relative %: 58 %
Platelets: 282 10*3/uL (ref 145–400)
RBC: 3.38 MIL/uL — ABNORMAL LOW (ref 3.70–5.45)
RDW: 14.5 % (ref 11.2–14.5)
WBC: 4.9 10*3/uL (ref 3.9–10.3)

## 2017-09-25 MED ORDER — FAMOTIDINE IN NACL 20-0.9 MG/50ML-% IV SOLN
20.0000 mg | Freq: Once | INTRAVENOUS | Status: AC
  Administered 2017-09-25: 20 mg via INTRAVENOUS

## 2017-09-25 MED ORDER — FAMOTIDINE IN NACL 20-0.9 MG/50ML-% IV SOLN
INTRAVENOUS | Status: AC
  Filled 2017-09-25: qty 50

## 2017-09-25 MED ORDER — DIPHENHYDRAMINE HCL 50 MG/ML IJ SOLN
INTRAMUSCULAR | Status: AC
  Filled 2017-09-25: qty 1

## 2017-09-25 MED ORDER — SODIUM CHLORIDE 0.9 % IV SOLN
80.0000 mg/m2 | Freq: Once | INTRAVENOUS | Status: AC
  Administered 2017-09-25: 138 mg via INTRAVENOUS
  Filled 2017-09-25: qty 23

## 2017-09-25 MED ORDER — SODIUM CHLORIDE 0.9 % IV SOLN
Freq: Once | INTRAVENOUS | Status: AC
  Administered 2017-09-25: 11:00:00 via INTRAVENOUS

## 2017-09-25 MED ORDER — SODIUM CHLORIDE 0.9% FLUSH
10.0000 mL | INTRAVENOUS | Status: DC | PRN
  Administered 2017-09-25: 10 mL
  Filled 2017-09-25: qty 10

## 2017-09-25 MED ORDER — HEPARIN SOD (PORK) LOCK FLUSH 100 UNIT/ML IV SOLN
500.0000 [IU] | Freq: Once | INTRAVENOUS | Status: AC | PRN
  Administered 2017-09-25: 500 [IU]
  Filled 2017-09-25: qty 5

## 2017-09-25 MED ORDER — SODIUM CHLORIDE 0.9 % IV SOLN
20.0000 mg | Freq: Once | INTRAVENOUS | Status: AC
  Administered 2017-09-25: 20 mg via INTRAVENOUS
  Filled 2017-09-25: qty 2

## 2017-09-25 MED ORDER — DIPHENHYDRAMINE HCL 50 MG/ML IJ SOLN
50.0000 mg | Freq: Once | INTRAMUSCULAR | Status: AC
  Administered 2017-09-25: 50 mg via INTRAVENOUS

## 2017-09-25 NOTE — Patient Instructions (Signed)
[facility] Discharge Instructions for Patients Receiving Chemotherapy  Today you received the following chemotherapy agents taxol  To help prevent nausea and vomiting after your treatment, we encourage you to take your nausea medication as directed   If you develop nausea and vomiting that is not controlled by your nausea medication, call the clinic.   BELOW ARE SYMPTOMS THAT SHOULD BE REPORTED IMMEDIATELY:  *FEVER GREATER THAN 100.5 F  *CHILLS WITH OR WITHOUT FEVER  NAUSEA AND VOMITING THAT IS NOT CONTROLLED WITH YOUR NAUSEA MEDICATION  *UNUSUAL SHORTNESS OF BREATH  *UNUSUAL BRUISING OR BLEEDING  TENDERNESS IN MOUTH AND THROAT WITH OR WITHOUT PRESENCE OF ULCERS  *URINARY PROBLEMS  *BOWEL PROBLEMS  UNUSUAL RASH Items with * indicate a potential emergency and should be followed up as soon as possible.  Feel free to call the clinic you have any questions or concerns. The clinic phone number is [phone].  

## 2017-09-25 NOTE — Telephone Encounter (Signed)
Spoke with patient to inform her that lorazepam 0.5 mg called in to [facility] with 2 refills per NP.  Patient voiced understanding.  No questions/concerns at this time.

## 2017-09-25 NOTE — Progress Notes (Signed)
[place]  [phone]) [phone] [phone]) [phone]     ID: [name] DOB: Mar 03, 1951  MR#: [id number]  CNO#:[id number]  Patient Care Team: Kelton Pillar, MD as PCP - General (Family Medicine) Magrinat, Virgie Dad, MD as Consulting Physician (Oncology) Rolm Bookbinder, MD as Consulting Physician (General Surgery) Eppie Gibson, MD as Attending Physician (Radiation Oncology) Newt Minion, MD as Consulting Physician (Orthopedic Surgery) Regal, Tamala Fothergill, DPM as Consulting Physician (Podiatry) Neldon Mc, Donnamarie Poag, MD as Consulting Physician (Allergy and Immunology) Scot Dock, NP OTHER MD:  CHIEF COMPLAINT: Estrogen receptor negative breast cancer  CURRENT TREATMENT: Adjuvant chemotherapy/immunotherapy   HISTORY OF CURRENT ILLNESS: From the original intake note:  "[name]" had bilateral screening mammography at [facility] 06/06/2017.  This showed a possible mass in the right breast at the 11 o'clock position.  On 06/13/2017 she underwent right diagnostic mammography and ultrasonography.  Breast density was category C.  In the right breast at the 11 o'clock position there was a 1 cm area by mammography.  By ultrasound this confirmed a 1.0 cm irregular mass with lobulated margins in the upper outer quadrant of the right breast.  There was a second, 0.4 cm lobulated mass in the same quadrant.  The right axilla was sonographically benign.  On 06/20/2017 biopsy of the 2 right breast masses in question was performed.  The final pathology (SAA [id number]) found the smaller mass to be only fibrocystic change.  This is felt to be concordant.  The larger mass however was an invasive ductal carcinoma, grade 3, estrogen and progesterone receptor negative, with an MIB-1 of 30%, and HER-2 amplified, with a signals ratio of 2.24.  The number per cell was 4.60.  The patient's subsequent history is as detailed below.  INTERVAL HISTORY: [name] returns to the office today for  follow up of her estrogen receptor negative breast cancer, Her-2 positive breast cancer.  She continues on adjuvant chemotherapy for her HER-2 positive breast cancer, with Paclitaxel given weekly and Trastuzumab given every 3 weeks.   She is due for Taxol today.  She last received Trastuzumab a week ago.     REVIEW OF SYSTEMS: [name] is feeling well today.   She had some foot cramps last week after receiving the trastuzumab.  She also noted some right sided abdominal pain. This pain is described as a shooting pain x 1 day for about 30 minutes.  She denies any associated symptoms with the pain such as constipation, diarrhea, nausea, vomiting, or pattern in regards to meal time. She notes some blood tinged nasal drainage when she blows her nose. She is fatigued about day 3.  She remains active, however will rest afterward when needed.  She also experienced a sore throat that has since resolved.    [name] has noted a new intermittent mild peripheral neuropathy in the very tips of her fingers and tips of her toes. She denies any motor deficits or continuous peripheral neuropathy, not current.  She needs a refill of Lorazepam today.      PAST MEDICAL HISTORY: Past Medical History:  Diagnosis Date  . Breast cancer ([place]) 05/2017   right breast  . Eczema   . Family history of breast cancer   . Family history of ovarian cancer   . Hypertension    toxemia during pregnancy, no meds now    PAST SURGICAL HISTORY: Past Surgical History:  Procedure Laterality Date  . ABDOMINAL HYSTERECTOMY    . BREAST LUMPECTOMY WITH RADIOACTIVE SEED AND SENTINEL  LYMPH NODE BIOPSY Right 07/17/2017   Procedure: BREAST LUMPECTOMY WITH RADIOACTIVE SEED AND SENTINEL LYMPH NODE BIOPSY;  Surgeon: Rolm Bookbinder, MD;  Location: [facility];  Service: General;  Laterality: Right;  . CESAREAN SECTION     x4  . DILATION AND CURETTAGE OF UTERUS    . KNEE SURGERY Right   . PORTACATH PLACEMENT N/A 07/17/2017    Procedure: INSERTION PORT-A-CATH WITH [place];  Surgeon: Rolm Bookbinder, MD;  Location: [place];  Service: General;  Laterality: N/A;    FAMILY HISTORY Family History  Problem Relation Age of Onset  . Breast cancer Mother 48       again at 30 in other breast   . Heart attack Father 2  . Breast cancer Sister 79  . Breast cancer Maternal Grandmother 34       spread to lungs, died at 30  . Ovarian cancer Cousin 39  The patient's father died at age 63 from a heart attack.  The patient's mother is currently living at age 30 (as of January 2019).  The patient had 1 sister who was diagnosed with breast cancer at age 82 and died from metastatic disease at age 24.  The patient has 1 brother.  In addition the patient's mother was diagnosed with breast cancer at age 54, on the left side, and now has a right-sided breast cancer diagnosed in January 2019.  There is in addition a cousin with ovarian cancer diagnosed when she was 68 years old  GYNECOLOGIC HISTORY:  No LMP recorded. Patient has had a hysterectomy. Menarche age 3, first live birth age 39, the patient had 3 live births, 1 of whom survived only 2 days.  She underwent hysterectomy without salpingo-oophorectomy September 05, 1978.  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  She is now retired.  Her husband [name] his Theme park manager at [employer].  The patient's daughter [name] lives in [name] where she is an Tourist information centre manager.  The patient's daughter [name] lives in [place] working for the department of defense.  The patient has 3 grandchildren.     ADVANCED DIRECTIVES: Not in place   HEALTH MAINTENANCE: Social History   Tobacco Use  . Smoking status: Never Smoker  . Smokeless tobacco: Never Used  Substance Use Topics  . Alcohol use: Yes    Comment: social  . Drug use: No      Colonoscopy: April 2018/[facility]  PAP: Status post hysterectomy  Bone density:   Allergies  Allergen Reactions  . Tape Itching and Rash  . Nsaids   . Sulfa Antibiotics Rash    Current Outpatient Medications  Medication Sig Dispense Refill  . aspirin 81 MG chewable tablet Chew 4 tablets (324 mg total) by mouth daily. (Patient taking differently: Chew 81 mg by mouth daily. ) 30 tablet 0  . CALCIUM-VITAMIN D PO Take 1 tablet by mouth daily.    . Coenzyme Q10 (COQ10 PO) Take by mouth daily.    Marland Kitchen lidocaine-prilocaine (EMLA) cream Apply to affected area once (Patient taking differently: Apply 1 application topically as needed (for pain). ) 30 g 3  . lisinopril (PRINIVIL,ZESTRIL) 5 MG tablet Take 1 tablet (5 mg total) by mouth daily. (Patient taking differently: Take 10 mg by mouth daily. ) 30 tablet 0  . LORazepam (ATIVAN) 0.5 MG tablet Take 1 tablet (  0.5 mg total) by mouth every 6 (six) hours as needed (Nausea or vomiting). 30 tablet 0  . ondansetron (ZOFRAN) 8 MG tablet Take 1 tablet (8 mg total) by mouth 2 (two) times daily as needed (Nausea or vomiting). 30 tablet 1  . pravastatin (PRAVACHOL) 80 MG tablet Take 1 tablet (80 mg total) by mouth daily at 6 PM. 60 tablet 0  . prochlorperazine (COMPAZINE) 10 MG tablet Take 1 tablet (10 mg total) by mouth every 6 (six) hours as needed (Nausea or vomiting). 30 tablet 1  . vitamin B-12 (CYANOCOBALAMIN) 1000 MCG tablet Take 1,000 mcg by mouth daily.     No current facility-administered medications for this visit.     OBJECTIVE:   Vitals:   09/25/17 0920  BP: (!) 172/80  Pulse: 90  Resp: 18  Temp: 98.5 F (36.9 C)  SpO2: 100%     Body mass index is 27.04 kg/m.   Wt Readings from Last 3 Encounters:  09/25/17 155 lb 1.6 oz (70.4 kg)  09/11/17 153 lb 3.2 oz (69.5 kg)  08/28/17 154 lb 9.6 oz (70.1 kg)  ECOG FS: 2 GENERAL: Patient is a well appearing female in no acute distress HEENT:  Sclerae anicteric.  Oropharynx clear and moist. No  ulcerations or evidence of oropharyngeal candidiasis. Neck is supple.  NODES:  No cervical, supraclavicular, or axillary lymphadenopathy palpated.  BREAST EXAM:  Right lumpectomy site healing well, no sign of infection noted LUNGS:  Clear to auscultation bilaterally.  No wheezes or rhonchi. HEART:  Regular rate and rhythm. No murmur appreciated. ABDOMEN:  Soft, nontender.  Positive, normoactive bowel sounds. No organomegaly palpated. MSK:  No focal spinal tenderness to palpation. Full range of motion bilaterally in the upper extremities. EXTREMITIES:  No peripheral edema.   SKIN:  Clear with no obvious rashes or skin changes. No nail dyscrasia. Circular healing skin lesion, no erythema, scabbing noted NEURO:  Nonfocal. Well oriented.  Appropriate affect.     LAB RESULTS:  CMP     Component Value Date/Time   NA 141 09/18/2017 1033   K 4.0 09/18/2017 1033   CL 107 09/18/2017 1033   CO2 26 09/18/2017 1033   GLUCOSE 111 09/18/2017 1033   BUN 15 09/18/2017 1033   CREATININE 0.84 09/18/2017 1033   CREATININE 1.01 06/27/2017 0843   CALCIUM 9.6 09/18/2017 1033   PROT 6.9 09/18/2017 1033   ALBUMIN 3.8 09/18/2017 1033   AST 17 09/18/2017 1033   AST 16 06/27/2017 0843   ALT 19 09/18/2017 1033   ALT 14 06/27/2017 0843   ALKPHOS 69 09/18/2017 1033   BILITOT 0.3 09/18/2017 1033   BILITOT 0.2 06/27/2017 0843   GFRNONAA >60 09/18/2017 1033   GFRNONAA 56 (L) 06/27/2017 0843   GFRAA >60 09/18/2017 1033   GFRAA >60 06/27/2017 0843    No results found for: TOTALPROTELP, ALBUMINELP, A1GS, A2GS, BETS, BETA2SER, GAMS, MSPIKE, SPEI  No results found for: KPAFRELGTCHN, LAMBDASER, KAPLAMBRATIO  Lab Results  Component Value Date   WBC 4.9 09/25/2017   NEUTROABS 2.9 09/25/2017   HGB 9.8 (L) 09/25/2017   HCT 30.4 (L) 09/25/2017   MCV 89.9 09/25/2017   PLT 282 09/25/2017    '@LASTCHEMISTRY' @  No results found for: LABCA2  No components found for: [id number]  No results for input(s): INR  in the last 168 hours.  No results found for: LABCA2  No results found for: LKG401  No results found for: UUV253  No results found for: GUY403  No  results found for: CA2729  No components found for: HGQUANT  No results found for: CEA1 / No results found for: CEA1   No results found for: AFPTUMOR  No results found for: CHROMOGRNA  No results found for: PSA1  Appointment on 09/25/2017  Component Date Value Ref Range Status  . WBC 09/25/2017 4.9  3.9 - 10.3 K/uL Final  . RBC 09/25/2017 3.38* 3.70 - 5.45 MIL/uL Final  . Hemoglobin 09/25/2017 9.8* 11.6 - 15.9 g/dL Final  . HCT 09/25/2017 30.4* 34.8 - 46.6 % Final  . MCV 09/25/2017 89.9  79.5 - 101.0 fL Final  . MCH 09/25/2017 29.0  25.1 - 34.0 pg Final  . MCHC 09/25/2017 32.2  31.5 - 36.0 g/dL Final  . RDW 09/25/2017 14.5  11.2 - 14.5 % Final  . Platelets 09/25/2017 282  145 - 400 K/uL Final  . Neutrophils Relative % 09/25/2017 58  % Final  . Neutro Abs 09/25/2017 2.9  1.5 - 6.5 K/uL Final  . Lymphocytes Relative 09/25/2017 31  % Final  . Lymphs Abs 09/25/2017 1.5  0.9 - 3.3 K/uL Final  . Monocytes Relative 09/25/2017 8  % Final  . Monocytes Absolute 09/25/2017 0.4  0.1 - 0.9 K/uL Final  . Eosinophils Relative 09/25/2017 2  % Final  . Eosinophils Absolute 09/25/2017 0.1  0.0 - 0.5 K/uL Final  . Basophils Relative 09/25/2017 1  % Final  . Basophils Absolute 09/25/2017 0.0  0.0 - 0.1 K/uL Final   Performed at [facility], [address] [place],  [address]    (this displays the last labs from the last 3 days)  No results found for: TOTALPROTELP, ALBUMINELP, A1GS, A2GS, BETS, BETA2SER, GAMS, MSPIKE, SPEI (this displays SPEP labs)  No results found for: KPAFRELGTCHN, LAMBDASER, KAPLAMBRATIO (kappa/lambda light chains)  No results found for: HGBA, HGBA2QUANT, HGBFQUANT, HGBSQUAN (Hemoglobinopathy evaluation)   No results found for: LDH  No results found for: IRON, TIBC,  IRONPCTSAT (Iron and TIBC)  No results found for: FERRITIN  Urinalysis    Component Value Date/Time   COLORURINE COLORLESS (A) 08/09/2017 2224   APPEARANCEUR CLEAR 08/09/2017 2224   LABSPEC 1.004 (L) 08/09/2017 2224   PHURINE 6.0 08/09/2017 2224   GLUCOSEU NEGATIVE 08/09/2017 2224   HGBUR NEGATIVE 08/09/2017 2224   BILIRUBINUR NEGATIVE 08/09/2017 2224   KETONESUR NEGATIVE 08/09/2017 2224   PROTEINUR NEGATIVE 08/09/2017 2224   NITRITE NEGATIVE 08/09/2017 2224   LEUKOCYTESUR NEGATIVE 08/09/2017 2224     STUDIES: Outside studies reviewed  ELIGIBLE FOR AVAILABLE RESEARCH PROTOCOL: no  ASSESSMENT: 67 y.o. [place] woman status post right breast upper outer quadrant biopsy 06/20/2017 for a clinical T1b N0, stage IA invasive ductal carcinoma, grade 3, estrogen and progesterone receptor negative, but HER-2 amplified, with an MIB-1 of 30%  (1) genetics testing 07/05/2017 through the Common Hereditary Cancer Panel offered by [facility] found no deleterious mutations in APC, ATM, AXIN2, BARD1, BMPR1A, BRCA1, BRCA2, BRIP1, CDH1, CDKN2A (p14ARF), CDKN2A (p16INK4a), CKD4, CHEK2, CTNNA1, DICER1, EPCAM (Deletion/duplication testing only), GREM1 (promoter region deletion/duplication testing only), KIT, MEN1, MLH1, MSH2, MSH3, MSH6, MUTYH, NBN, NF1, NHTL1, PALB2, PDGFRA, PMS2, POLD1, POLE, PTEN, RAD50, RAD51C, RAD51D, SDHB, SDHC, SDHD, SMAD4, SMARCA4. STK11, TP53, TSC1, TSC2, and VHL.  The following genes were evaluated for sequence changes only: SDHA and HOXB13 c.251G>A variant only.  (a) A Variant of uncertain significance in MSH2 was identified c.1331G>T (p.Arg444Leu).   (2) status post right lumpectomy and sentinel lymph node sampling 07/17/2017 for  a pT1c pN0, stage IA invasive ductal carcinoma, grade 2, with negative margins.  A total of 5 lymph nodes were removed  (3) adjuvant chemotherapy will consist of paclitaxel weekly x12 together with trastuzumab every 21 days starting  08/07/2017  (4) trastuzumab will be continued to total 1 year  (a) echo on 08/02/17 demonstrates an ejection fraction of 65-70%  (b) echo on 08/10/2017 (pt had TIA) demonstrates an ejection fraction of 65-70%  5) adjuvant radiation to follow  PLAN:  [name] is doing well today.  Her CBC is stable.  She has mild intermittent peripheral neuropathy.  She will proceed with treatment today, with the understanding that if it worsens or becomes continuous that she will let us know, as we will need to hold/change/stop chemotherapy depending on the severity.    I will refill Xariah's Lorazepam.  Her cramps are mild and likely related to Trastuzumab.  I also recommended that she continue to drink plenty of water, which she admits she has done less of this week.     She will return weekly for treatment and every other week to see myself or Dr. Jana Hakim.  She knows to call for any questions or concerns prior to her next appointment with [place].     A total of (20) minutes of face-to-face time was spent with this patient with greater than 50% of that time in counseling and care-coordination.    Wilber Bihari, NP 09/25/17 9:56 AM Medical Oncology and Hematology [facility] [address] [place], [place] [address] Tel. [phone]    Fax. [phone]

## 2017-09-26 ENCOUNTER — Telehealth: Payer: Self-pay | Admitting: Adult Health

## 2017-09-26 NOTE — Telephone Encounter (Signed)
Per 4/9 no los 

## 2017-10-01 ENCOUNTER — Other Ambulatory Visit: Payer: Self-pay | Admitting: *Deleted

## 2017-10-01 ENCOUNTER — Telehealth: Payer: Self-pay | Admitting: *Deleted

## 2017-10-01 DIAGNOSIS — Z171 Estrogen receptor negative status [ER-]: Principal | ICD-10-CM

## 2017-10-01 DIAGNOSIS — C50411 Malignant neoplasm of upper-outer quadrant of right female breast: Secondary | ICD-10-CM

## 2017-10-01 MED ORDER — LORAZEPAM 0.5 MG PO TABS: 0.5000 mg | ORAL_TABLET | Freq: Four times a day (QID) | ORAL | 0 refills | Status: DC | PRN

## 2017-10-01 NOTE — Telephone Encounter (Signed)
FYI "Saw Mendel Ryder last week.  She instructed me to call for continued neuropathy.  Having continued neuropathy to left hand and fingertips of right hand.  Return number [phone]."

## 2017-10-02 ENCOUNTER — Other Ambulatory Visit: Payer: Medicare Other

## 2017-10-02 ENCOUNTER — Inpatient Hospital Stay: Payer: Medicare Other

## 2017-10-02 ENCOUNTER — Encounter: Payer: Self-pay | Admitting: Adult Health

## 2017-10-02 ENCOUNTER — Telehealth: Payer: Self-pay | Admitting: Adult Health

## 2017-10-02 ENCOUNTER — Inpatient Hospital Stay (HOSPITAL_BASED_OUTPATIENT_CLINIC_OR_DEPARTMENT_OTHER): Payer: Medicare Other | Admitting: Adult Health

## 2017-10-02 ENCOUNTER — Other Ambulatory Visit: Payer: Self-pay | Admitting: *Deleted

## 2017-10-02 VITALS — BP 174/86 | HR 85 | Temp 98.6°F | Resp 18 | Ht 63.5 in | Wt 154.0 lb

## 2017-10-02 DIAGNOSIS — Z171 Estrogen receptor negative status [ER-]: Secondary | ICD-10-CM | POA: Diagnosis not present

## 2017-10-02 DIAGNOSIS — R030 Elevated blood-pressure reading, without diagnosis of hypertension: Secondary | ICD-10-CM

## 2017-10-02 DIAGNOSIS — C50411 Malignant neoplasm of upper-outer quadrant of right female breast: Secondary | ICD-10-CM

## 2017-10-02 DIAGNOSIS — G620 Drug-induced polyneuropathy: Secondary | ICD-10-CM

## 2017-10-02 DIAGNOSIS — Z95828 Presence of other vascular implants and grafts: Secondary | ICD-10-CM

## 2017-10-02 DIAGNOSIS — Z5112 Encounter for antineoplastic immunotherapy: Secondary | ICD-10-CM | POA: Diagnosis not present

## 2017-10-02 LAB — CBC WITH DIFFERENTIAL/PLATELET
Basophils Absolute: 0.1 10*3/uL (ref 0.0–0.1)
Basophils Relative: 1 %
Eosinophils Absolute: 0.1 10*3/uL (ref 0.0–0.5)
Eosinophils Relative: 2 %
HCT: 31.5 % — ABNORMAL LOW (ref 34.8–46.6)
Hemoglobin: 10.4 g/dL — ABNORMAL LOW (ref 11.6–15.9)
Lymphocytes Relative: 34 %
Lymphs Abs: 1.8 10*3/uL (ref 0.9–3.3)
MCH: 29.1 pg (ref 25.1–34.0)
MCHC: 32.9 g/dL (ref 31.5–36.0)
MCV: 88.7 fL (ref 79.5–101.0)
Monocytes Absolute: 0.4 10*3/uL (ref 0.1–0.9)
Monocytes Relative: 7 %
Neutro Abs: 3 10*3/uL (ref 1.5–6.5)
Neutrophils Relative %: 56 %
Platelets: 337 10*3/uL (ref 145–400)
RBC: 3.55 MIL/uL — ABNORMAL LOW (ref 3.70–5.45)
RDW: 15.1 % — ABNORMAL HIGH (ref 11.2–14.5)
WBC: 5.4 10*3/uL (ref 3.9–10.3)

## 2017-10-02 LAB — COMPREHENSIVE METABOLIC PANEL
ALT: 15 U/L (ref 0–55)
AST: 15 U/L (ref 5–34)
Albumin: 3.8 g/dL (ref 3.5–5.0)
Alkaline Phosphatase: 75 U/L (ref 40–150)
Anion gap: 7 (ref 3–11)
BUN: 13 mg/dL (ref 7–26)
CO2: 27 mmol/L (ref 22–29)
Calcium: 9.9 mg/dL (ref 8.4–10.4)
Chloride: 107 mmol/L (ref 98–109)
Creatinine, Ser: 0.85 mg/dL (ref 0.60–1.10)
GFR calc Af Amer: >60 mL/min (ref >60)
GFR calc non Af Amer: >60 mL/min (ref >60)
Glucose, Bld: 110 mg/dL (ref 70–140)
Potassium: 4.8 mmol/L (ref 3.5–5.1)
Sodium: 141 mmol/L (ref 136–145)
Total Bilirubin: <0.2 mg/dL — ABNORMAL LOW (ref 0.2–1.2)
Total Protein: 7.3 g/dL (ref 6.4–8.3)

## 2017-10-02 MED ORDER — SODIUM CHLORIDE 0.9% FLUSH
10.0000 mL | INTRAVENOUS | Status: DC | PRN
  Administered 2017-10-02: 10 mL
  Filled 2017-10-02: qty 10

## 2017-10-02 NOTE — Telephone Encounter (Signed)
Gave patient AVS and calendar of upcoming April through July appointments.

## 2017-10-02 NOTE — Progress Notes (Addendum)
[facility]  [phone] [phone]     ID: ZEOLA BRYS DOB: Oct 18, 1950  MR#: [id number]  PYK#:[id number]  Patient Care Team: Kelton Pillar, MD as PCP - General (Family Medicine) Magrinat, Virgie Dad, MD as Consulting Physician (Oncology) Rolm Bookbinder, MD as Consulting Physician (General Surgery) Eppie Gibson, MD as Attending Physician (Radiation Oncology) Newt Minion, MD as Consulting Physician (Orthopedic Surgery) Regal, Tamala Fothergill, DPM as Consulting Physician (Podiatry) Neldon Mc, Donnamarie Poag, MD as Consulting Physician (Allergy and Immunology) Scot Dock, NP OTHER MD:  CHIEF COMPLAINT: Estrogen receptor negative breast cancer  CURRENT TREATMENT: Adjuvant chemotherapy/immunotherapy   HISTORY OF CURRENT ILLNESS: From the original intake note:  "April [name]" had bilateral screening mammography at [facility] 06/06/2017.  This showed a possible mass in the right breast at the 11 o'clock position.  On 06/13/2017 she underwent right diagnostic mammography and ultrasonography.  Breast density was category C.  In the right breast at the 11 o'clock position there was a 1 cm area by mammography.  By ultrasound this confirmed a 1.0 cm irregular mass with lobulated margins in the upper outer quadrant of the right breast.  There was a second, 0.4 cm lobulated mass in the same quadrant.  The right axilla was sonographically benign.  On 06/20/2017 biopsy of the 2 right breast masses in question was performed.  The final pathology (SAA [id number]) found the smaller mass to be only fibrocystic change.  This is felt to be concordant.  The larger mass however was an invasive ductal carcinoma, grade 3, estrogen and progesterone receptor negative, with an MIB-1 of 30%, and HER-2 amplified, with a signals ratio of 2.24.  The number per cell was 4.60.  The patient's subsequent history is as detailed below.  INTERVAL HISTORY: April [name] returns to the office today for  follow up of her estrogen receptor negative breast cancer, Her-2 positive breast cancer.  She continues on adjuvant chemotherapy for her HER-2 positive breast cancer, with Paclitaxel given weekly and Trastuzumab given every 3 weeks.   She is due for her ninth dose of Paclitaxel today.  She last received Trastuzumab two weeks ago.     REVIEW OF SYSTEMS: April [name] is feeling well today. She called because since her last cycle her intermittent peripheral neuropathy became continuous.  It is continuous in the whole left hand, and in the right it is just in the tips of her fingers.  It is intermittent in tips of her toes.  She denies any motor difficulty, she notes she is more careful with picking things up as well.  She says that her legs have been more achy this time, and she has taken her time with walking.  The pain has since resolved.  She continues to have high blood pressure.  She checks her blood pressure at home and it is in the 539J systolic, however when she is here it is higher.  She is doing well otherwise and a detailed ROS is otherwise non contributory.      PAST MEDICAL HISTORY: Past Medical History:  Diagnosis Date  . Breast cancer ([place]) 05/2017   right breast  . Eczema   . Family history of breast cancer   . Family history of ovarian cancer   . Hypertension    toxemia during pregnancy, no meds now    PAST SURGICAL HISTORY: Past Surgical History:  Procedure Laterality Date  . ABDOMINAL HYSTERECTOMY    . BREAST LUMPECTOMY WITH RADIOACTIVE SEED AND SENTINEL LYMPH NODE BIOPSY Right  07/17/2017   Procedure: BREAST LUMPECTOMY WITH RADIOACTIVE SEED AND SENTINEL LYMPH NODE BIOPSY;  Surgeon: Rolm Bookbinder, MD;  Location: [facility];  Service: General;  Laterality: Right;  . CESAREAN SECTION     x4  . DILATION AND CURETTAGE OF UTERUS    . KNEE SURGERY Right   . PORTACATH PLACEMENT N/A 07/17/2017   Procedure: INSERTION PORT-A-CATH WITH [place];  Surgeon: Rolm Bookbinder,  MD;  Location: [facility];  Service: General;  Laterality: N/A;    FAMILY HISTORY Family History  Problem Relation Age of Onset  . Breast cancer Mother 35       again at 54 in other breast   . Heart attack Father 9  . Breast cancer Sister 84  . Breast cancer Maternal Grandmother 81       spread to lungs, died at 34  . Ovarian cancer Cousin 44  The patient's father died at age 66 from a heart attack.  The patient's mother is currently living at age 26 (as of January 2019).  The patient had 1 sister who was diagnosed with breast cancer at age 72 and died from metastatic disease at age 31.  The patient has 1 brother.  In addition the patient's mother was diagnosed with breast cancer at age 54, on the left side, and now has a right-sided breast cancer diagnosed in January 2019.  There is in addition a cousin with ovarian cancer diagnosed when she was 68 years old  GYNECOLOGIC HISTORY:  No LMP recorded. Patient has had a hysterectomy. Menarche age 73, first live birth age 49, the patient had 3 live births, 1 of whom survived only 2 days.  She underwent hysterectomy without salpingo-oophorectomy September 05, 1978.  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  She is now retired.  Her husband [name] his Theme park manager at [employer].  The patient's daughter [name] lives in [place] where she is an Tourist information centre manager.  The patient's daughter [name] lives in [place] working for the department of defense.  The patient has 3 grandchildren.    ADVANCED DIRECTIVES: Not in place   HEALTH MAINTENANCE: Social History   Tobacco Use  . Smoking status: Never Smoker  . Smokeless tobacco: Never Used  Substance Use Topics  . Alcohol use: Yes    Comment: social  . Drug use: No     Colonoscopy: April 2018/[facility]  PAP: Status post  hysterectomy  Bone density:   Allergies  Allergen Reactions  . Tape Itching and Rash  . Nsaids   . Sulfa Antibiotics Rash    Current Outpatient Medications  Medication Sig Dispense Refill  . aspirin 81 MG chewable tablet Chew 4 tablets (324 mg total) by mouth daily. (Patient taking differently: Chew 81 mg by mouth daily. ) 30 tablet 0  . CALCIUM-VITAMIN D PO Take 1 tablet by mouth daily.    . Coenzyme Q10 (COQ10 PO) Take by mouth daily.    Marland Kitchen lidocaine-prilocaine (EMLA) cream Apply to affected area once (Patient taking differently: Apply 1 application topically as needed (for pain). ) 30 g 3  . lisinopril (PRINIVIL,ZESTRIL) 10 MG tablet     . LORazepam (ATIVAN) 0.5 MG tablet Take 1 tablet (0.5 mg total) by mouth every 6 (six) hours as needed (Nausea or vomiting). 30 tablet 0  . ondansetron (ZOFRAN) 8 MG tablet  Take 1 tablet (8 mg total) by mouth 2 (two) times daily as needed (Nausea or vomiting). 30 tablet 1  . pravastatin (PRAVACHOL) 80 MG tablet Take 1 tablet (80 mg total) by mouth daily at 6 PM. 60 tablet 0  . prochlorperazine (COMPAZINE) 10 MG tablet Take 1 tablet (10 mg total) by mouth every 6 (six) hours as needed (Nausea or vomiting). 30 tablet 1  . vitamin B-12 (CYANOCOBALAMIN) 1000 MCG tablet Take 1,000 mcg by mouth daily.     No current facility-administered medications for this visit.     OBJECTIVE:   Vitals:   10/02/17 1129  BP: (!) 174/86  Pulse: 85  Resp: 18  Temp: 98.6 F (37 C)  SpO2: 100%     Body mass index is 26.85 kg/m.   Wt Readings from Last 3 Encounters:  10/02/17 154 lb (69.9 kg)  09/25/17 155 lb 1.6 oz (70.4 kg)  09/11/17 153 lb 3.2 oz (69.5 kg)  ECOG FS: 2 GENERAL: Patient is a well appearing female in no acute distress HEENT:  Sclerae anicteric.  Oropharynx clear and moist. No ulcerations or evidence of oropharyngeal candidiasis. Neck is supple.  NODES:  No cervical, supraclavicular, or axillary lymphadenopathy palpated.  BREAST EXAM:  Right  lumpectomy site healing well, no sign of infection noted LUNGS:  Clear to auscultation bilaterally.  No wheezes or rhonchi. HEART:  Regular rate and rhythm. No murmur appreciated. ABDOMEN:  Soft, nontender.  Positive, normoactive bowel sounds. No organomegaly palpated. MSK:  No focal spinal tenderness to palpation. Full range of motion bilaterally in the upper extremities. EXTREMITIES:  No peripheral edema.   SKIN:  Clear with no obvious rashes or skin changes. No nail dyscrasia. Circular healing skin lesion, no erythema, scabbing noted NEURO:  Nonfocal. Well oriented.  Appropriate affect.     LAB RESULTS:  CMP     Component Value Date/Time   NA 141 10/02/2017 1030   K 4.8 10/02/2017 1030   CL 107 10/02/2017 1030   CO2 27 10/02/2017 1030   GLUCOSE 110 10/02/2017 1030   BUN 13 10/02/2017 1030   CREATININE 0.85 10/02/2017 1030   CREATININE 1.01 06/27/2017 0843   CALCIUM 9.9 10/02/2017 1030   PROT 7.3 10/02/2017 1030   ALBUMIN 3.8 10/02/2017 1030   AST 15 10/02/2017 1030   AST 16 06/27/2017 0843   ALT 15 10/02/2017 1030   ALT 14 06/27/2017 0843   ALKPHOS 75 10/02/2017 1030   BILITOT <0.2 (L) 10/02/2017 1030   BILITOT 0.2 06/27/2017 0843   GFRNONAA >60 10/02/2017 1030   GFRNONAA 56 (L) 06/27/2017 0843   GFRAA >60 10/02/2017 1030   GFRAA >60 06/27/2017 0843    No results found for: TOTALPROTELP, ALBUMINELP, A1GS, A2GS, BETS, BETA2SER, GAMS, MSPIKE, SPEI  No results found for: KPAFRELGTCHN, LAMBDASER, KAPLAMBRATIO  Lab Results  Component Value Date   WBC 5.4 10/02/2017   NEUTROABS 3.0 10/02/2017   HGB 10.4 (L) 10/02/2017   HCT 31.5 (L) 10/02/2017   MCV 88.7 10/02/2017   PLT 337 10/02/2017    _0 @  No results found for: LABCA2  No components found for: [id number]  No results for input(s): INR in the last 168 hours.  No results found for: LABCA2  No results found for: MNO177  No results found for: NHA579  No results found for: UXY333  No  results found for: CA2729  No components found for: HGQUANT  No results found for: CEA1 / No results found for: CEA1   No  results found for: AFPTUMOR  No results found for: CHROMOGRNA  No results found for: PSA1  Appointment on 10/02/2017  Component Date Value Ref Range Status  . Sodium 10/02/2017 141  136 - 145 mmol/L Final  . Potassium 10/02/2017 4.8  3.5 - 5.1 mmol/L Final  . Chloride 10/02/2017 107  98 - 109 mmol/L Final  . CO2 10/02/2017 27  22 - 29 mmol/L Final  . Glucose, Bld 10/02/2017 110  70 - 140 mg/dL Final  . BUN 10/02/2017 13  7 - 26 mg/dL Final  . Creatinine, Ser 10/02/2017 0.85  0.60 - 1.10 mg/dL Final  . Calcium 10/02/2017 9.9  8.4 - 10.4 mg/dL Final  . Total Protein 10/02/2017 7.3  6.4 - 8.3 g/dL Final  . Albumin 10/02/2017 3.8  3.5 - 5.0 g/dL Final  . AST 10/02/2017 15  5 - 34 U/L Final  . ALT 10/02/2017 15  0 - 55 U/L Final  . Alkaline Phosphatase 10/02/2017 75  40 - 150 U/L Final  . Total Bilirubin 10/02/2017 <0.2* 0.2 - 1.2 mg/dL Final  . GFR calc non Af Amer 10/02/2017 >60  >60 mL/min Final  . GFR calc Af Amer 10/02/2017 >60  >60 mL/min Final   Comment: (NOTE) The eGFR has been calculated using the CKD EPI equation. This calculation has not been validated in all clinical situations. eGFR's persistently <60 mL/min signify possible Chronic Kidney Disease.   Georgiann Hahn gap 10/02/2017 7  3 - 11 Final   Performed at [facility], [place] [address], [place], [place] [address]  . WBC 10/02/2017 5.4  3.9 - 10.3 K/uL Final  . RBC 10/02/2017 3.55* 3.70 - 5.45 MIL/uL Final  . Hemoglobin 10/02/2017 10.4* 11.6 - 15.9 g/dL Final  . HCT 10/02/2017 31.5* 34.8 - 46.6 % Final  . MCV 10/02/2017 88.7  79.5 - 101.0 fL Final  . MCH 10/02/2017 29.1  25.1 - 34.0 pg Final  . MCHC 10/02/2017 32.9  31.5 - 36.0 g/dL Final  . RDW 10/02/2017 15.1* 11.2 - 14.5 % Final  . Platelets 10/02/2017 337  145 - 400 K/uL Final  . Neutrophils Relative % 10/02/2017 56  %  Final  . Neutro Abs 10/02/2017 3.0  1.5 - 6.5 K/uL Final  . Lymphocytes Relative 10/02/2017 34  % Final  . Lymphs Abs 10/02/2017 1.8  0.9 - 3.3 K/uL Final  . Monocytes Relative 10/02/2017 7  % Final  . Monocytes Absolute 10/02/2017 0.4  0.1 - 0.9 K/uL Final  . Eosinophils Relative 10/02/2017 2  % Final  . Eosinophils Absolute 10/02/2017 0.1  0.0 - 0.5 K/uL Final  . Basophils Relative 10/02/2017 1  % Final  . Basophils Absolute 10/02/2017 0.1  0.0 - 0.1 K/uL Final   Performed at [facility], [address] [place], [place] [address]    (this displays the last labs from the last 3 days)  No results found for: TOTALPROTELP, ALBUMINELP, A1GS, A2GS, BETS, BETA2SER, GAMS, MSPIKE, SPEI (this displays SPEP labs)  No results found for: KPAFRELGTCHN, LAMBDASER, KAPLAMBRATIO (kappa/lambda light chains)  No results found for: HGBA, HGBA2QUANT, HGBFQUANT, HGBSQUAN (Hemoglobinopathy evaluation)   No results found for: LDH  No results found for: IRON, TIBC, IRONPCTSAT (Iron and TIBC)  No results found for: FERRITIN  Urinalysis    Component Value Date/Time   COLORURINE COLORLESS (A) 08/09/2017 [place] 08/09/2017 2224   LABSPEC 1.004 (L) 08/09/2017 2224   PHURINE 6.0 08/09/2017 2224  GLUCOSEU NEGATIVE 08/09/2017 2224   HGBUR NEGATIVE 08/09/2017 2224   BILIRUBINUR NEGATIVE 08/09/2017 2224   KETONESUR NEGATIVE 08/09/2017 2224   PROTEINUR NEGATIVE 08/09/2017 2224   NITRITE NEGATIVE 08/09/2017 2224   LEUKOCYTESUR NEGATIVE 08/09/2017 2224     STUDIES: Outside studies reviewed  ELIGIBLE FOR AVAILABLE RESEARCH PROTOCOL: no  ASSESSMENT: 67 y.o. [place] woman status post right breast upper outer quadrant biopsy 06/20/2017 for a clinical T1b N0, stage IA invasive ductal carcinoma, grade 3, estrogen and progesterone receptor negative, but HER-2 amplified, with an MIB-1 of 30%  (1) genetics testing 07/05/2017 through the Common  Hereditary Cancer Panel offered by [facility] found no deleterious mutations in APC, ATM, AXIN2, BARD1, BMPR1A, BRCA1, BRCA2, BRIP1, CDH1, CDKN2A (p14ARF), CDKN2A (p16INK4a), CKD4, CHEK2, CTNNA1, DICER1, EPCAM (Deletion/duplication testing only), GREM1 (promoter region deletion/duplication testing only), KIT, MEN1, MLH1, MSH2, MSH3, MSH6, MUTYH, NBN, NF1, NHTL1, PALB2, PDGFRA, PMS2, POLD1, POLE, PTEN, RAD50, RAD51C, RAD51D, SDHB, SDHC, SDHD, SMAD4, SMARCA4. STK11, TP53, TSC1, TSC2, and VHL.  The following genes were evaluated for sequence changes only: SDHA and HOXB13 c.251G>A variant only.  (a) A Variant of uncertain significance in MSH2 was identified c.1331G>T (p.Arg444Leu).   (2) status post right lumpectomy and sentinel lymph node sampling 07/17/2017 for a pT1c pN0, stage IA invasive ductal carcinoma, grade 2, with negative margins.  A total of 5 lymph nodes were removed  (3) adjuvant chemotherapy will consist of paclitaxel weekly x12 together with trastuzumab every 21 days starting 08/07/2017. Paclitaxel stopped 10/05/17 due to peripheral neuropathy.  (4) trastuzumab will be continued to total 1 year  (a) echo on 08/02/17 demonstrates an ejection fraction of 65-70%  (b) echo on 08/10/2017 (pt had TIA) demonstrates an ejection fraction of 65-70%  5) adjuvant radiation to follow  PLAN:  April [name] is doing moderately well today.  Unfortunately, she has developed constant peripheral neuropathy.  I reviewed this with Dr. Jana Hakim.  He came into the appointment today and reviewed this with her.  She will stop taking the Paclitaxel chemotherapy due to peripheral neuropathy.  She was recommended to wear a wrist splint on the left wrist, as some of the numbness in the hand is likely related to carpal tunnel.  She will continue to receive Trastuzumab every three weeks.  She will go ahead and be re referred to Dr. Isidore Moos in radiation oncology to get started with adjuvant radiation.  She will monitor her blood  pressure at home and bring in a record to her next appointment in one week with Dr. Jana Hakim.    April [name] will return in one week for labs, f/u with Dr. Jana Hakim and Trastuzumab.  She knows to call for any questions or concerns prior to her next appointment with [place].      Wilber Bihari, NP 10/02/17 1:47 PM Medical Oncology and Hematology [facility] [address] [place], [place] [address] Tel. [phone]    Fax. [phone]     ADDENDUM: April [name] has developed consistent, persistent and convincing neuropathy symptoms and therefore we are stopping her chemotherapy.  She is now ready to start adjuvant radiation.    We are of course continuing the adjuvant trastuzumab as before.  I personally saw this patient and performed a substantive portion of this encounter with the listed APP documented above.   Chauncey Cruel, MD Medical Oncology and Hematology [facility] [address], [place] [address] Tel. [phone]    Fax. [phone]

## 2017-10-03 ENCOUNTER — Encounter: Payer: Self-pay | Admitting: Radiation Oncology

## 2017-10-03 NOTE — Progress Notes (Signed)
Location of Breast Cancer: Right Breast  Histology per Pathology Report:  06/20/17 Diagnosis 1. Breast, right, needle core biopsy - FIBROCYSTIC CHANGE. - NO MALIGNANCY IDENTIFIED. 2. Breast, right, needle core biopsy - INVASIVE DUCTAL CARCINOMA, SEE COMMENT. - DUCTAL CARCINOMA IN SITU.  2. Receptor Status: ER(NEG), PR (NEG), Her2-neu (POS), Ki-(30%)  07/17/17 Diagnosis 1. Breast, lumpectomy, Right - INVASIVE DUCTAL CARCINOMA, GRADE II/III, SPANNING 1.6 CM. - DUCTAL CARCINOMA IN SITU, LOW GRADE. - THE SURGICAL RESECTION MARGINS ARE NEGATIVE FOR CARCINOMA. - SEE ONCOLOGY TABLE BELOW. 2. Breast, excision, Right additional medial margin - RADIAL SCAR. - USUAL DUCTAL HYPERPLASIA. - THERE IS NO EVIDENCE OF MALIGNANCY. - SEE COMMENT. 3. Breast, excision, Right additional superior margin - RADIAL SCAR(S) - FIBROCYSTIC CHANGES. - USUAL DUCTAL HYPERPLASIA. - THERE IS NO EVIDENCE OF MALIGNANCY. - SEE COMMENT. 4. Breast, excision, Right additional inferior margin - BENIGN BREAST PARENCHYMA. - THERE IS NO EVIDENCE OF MALIGNANCY. - SEE COMMENT. 5. Lymph node, sentinel, biopsy, Right - THERE IS NO EVIDENCE OF CARICNOMA IN 1 OF 1 LYMPH NODE (0/1) 6. Lymph node, sentinel, biopsy, Right - THERE IS NO EVIDENCE OF CARICNOMA IN 1 OF 1 LYMPH NODE (0/1) 7. Lymph node, sentinel, biopsy, Right - THERE IS NO EVIDENCE OF CARICNOMA IN 1 OF 1 LYMPH NODE (0/1) 8. Lymph node, sentinel, biopsy, Right - BENIGN FIBROADIPOSE TISSUE. - NO LYMPH NODAL TISSUE IS IDENTIFIED. 9. Lymph node, sentinel, biopsy, Right - THERE IS NO EVIDENCE OF CARICNOMA IN 1 OF 1 LYMPH NODE (0/1) 10. Lymph node, sentinel, biopsy, Right - THERE IS NO EVIDENCE OF CARCINOMA IN 1 OF 1 LYMPH NODE (0/1).  Did patient present with symptoms or was this found on screening mammography?: It was discovered on a screening mammogram.   Past/Anticipated interventions by surgeon, if any: 07/17/17 Procedure:Right breast seed guided  lumpectomy Rightdeep axillary sentinel node biopsy Right IJ US guided port placement Surgeon: Dr April [name]  Past/Anticipated interventions by medical oncology, if any:  10/02/17 April Massed NP Paclitaxel weekly x12 together with trastuzumab every 21 days starting 08/07/2017. Paclitaxel stopped 10/05/17 due to peripheral neuropathy. Adjuvant radiation to follow  PLAN: April [name] is doing moderately well today.  Unfortunately, she has developed constant peripheral neuropathy.  I reviewed this with Dr. Jana [name].  He came into the appointment today and reviewed this with her.  She will stop taking the Paclitaxel chemotherapy due to peripheral neuropathy.  She was recommended to wear a wrist splint on the left wrist, as some of the numbness in the hand is likely related to carpal tunnel.  She will continue to receive Trastuzumab every three weeks. She will go ahead and be re referred to Dr. Isidore [name] in radiation oncology to get started with adjuvant radiation.  She will monitor her blood pressure at home and bring in a record to her next appointment in one week with Dr. Jana [name].    April [name] will return in one week for labs, f/u with Dr. Jana [name] and Trastuzumab.  She knows to call for any questions or concerns prior to her next appointment with [place].     Lymphedema issues, if any:  None  Pain issues, if any:  Denies  SAFETY ISSUES:  Prior radiation? No  Pacemaker/ICD? No  Possible current pregnancy? No  Is the patient on methotrexate? No  Current Complaints / other details:  67 year old female. Systolic bp elevated despite taking lisinopril this morning. Patient reports she brought her mother her 12 years ago for breast radiation.  April [name], Stephani Police, RN 10/03/2017,11:36 AM

## 2017-10-08 NOTE — Progress Notes (Signed)
[place]  [phone] Fax:([phone]     ID: DOREEN GARRETSON DOB: [birth year]  MR#: [id number]  DXA#:[id number]  Patient Care Team: Kelton Pillar, MD as PCP - General (Family Medicine) Keneisha Heckart, Virgie Dad, MD as Consulting Physician (Oncology) Rolm Bookbinder, MD as Consulting Physician (General Surgery) Eppie Gibson, MD as Attending Physician (Radiation Oncology) Newt Minion, MD as Consulting Physician (Orthopedic Surgery) Regal, Tamala Fothergill, DPM as Consulting Physician (Podiatry) Neldon Mc, Donnamarie Poag, MD as Consulting Physician (Allergy and Immunology) OTHER MD:  CHIEF COMPLAINT: Estrogen receptor negative breast cancer  CURRENT TREATMENT: trastuzumab   HISTORY OF CURRENT ILLNESS: From the original intake note:  "Areal" had bilateral screening mammography at [facility] 06/06/2017.  This showed a possible mass in the right breast at the 11 o'clock position.  On 06/13/2017 she underwent right diagnostic mammography and ultrasonography.  Breast density was category C.  In the right breast at the 11 o'clock position there was a 1 cm area by mammography.  By ultrasound this confirmed a 1.0 cm irregular mass with lobulated margins in the upper outer quadrant of the right breast.  There was a second, 0.4 cm lobulated mass in the same quadrant.  The right axilla was sonographically benign.  On 01/68/2019 biopsy of the 2 right breast masses in question was performed.  The final pathology (SAA [id number]) found the smaller mass to be only fibrocystic change.  This is felt to be concordant.  The larger mass however was an invasive ductal carcinoma, grade 3, estrogen and progesterone receptor negative, with an MIB-1 of 30%, and HER-2 amplified, with a signals ratio of 2.24.  The number per cell was 4.60.  The patient's subsequent history is as detailed below.  INTERVAL HISTORY: Desma returns to the office today for follow up of her estrogen receptor negative breast  cancer. Today is day 1 cycle 68 of adjuvant trastuzumab for her HER-2 positive breast cancer, with Trastuzumab given every 3 weeks. She is tolerating this treatment well. She has had minimal fatigue and voices concern for if herceptin is the cause of this. She has compazine, however she doesn't use this.   Since her last visit to the office, she had an echocardiogram completed on 2/68/2019 with a left ventricular ejection fraction of 65-70%.   Her paclitaxel treatments were discontinued last week because of peripheral neuropathy.  She also had some carpal tunnel symptoms on the left.  She is now using a wrist splint and the carpal tunnel symptoms are improved.  The peripheral neuropathy is minimal at the finger pads.  It does not keep her from doing activities of daily living.  She has no neuropathy in her feet that she is aware of.   REVIEW OF SYSTEMS: Kay reports that for exercise, she goes to the gym and utilizes the swimming pool and completes minimal weight lifting. She notes that she has been using a splint for her left hand neuropathy and she notes that she still has neuropathy to her bilateral thumbs and tips of her fingers. She also notes that with her splint, the neuropathy is only felt halfway up her hand. She followed up with Radiation Oncologist, Dr. Isidore Moos and will receive 20 treatments. She denies unusual headaches, visual changes, nausea, vomiting, or dizziness. There has been no unusual cough, phlegm production, or pleurisy. This been no change in bowel or bladder habits. She denies unexplained fatigue or unexplained weight loss, bleeding, rash, or fever. A detailed review of systems was otherwise stable.  PAST MEDICAL HISTORY: Past Medical History:  Diagnosis Date  . Breast cancer ([place]) 05/2017   right breast  . Eczema   . Family history of breast cancer   . Family history of ovarian cancer   . Hypertension    toxemia during pregnancy, no meds now    PAST SURGICAL  HISTORY: Past Surgical History:  Procedure Laterality Date  . ABDOMINAL HYSTERECTOMY    . BREAST LUMPECTOMY WITH RADIOACTIVE SEED AND SENTINEL LYMPH NODE BIOPSY Right 07/17/2017   Procedure: BREAST LUMPECTOMY WITH RADIOACTIVE SEED AND SENTINEL LYMPH NODE BIOPSY;  Surgeon: Rolm Bookbinder, MD;  Location: [place];  Service: General;  Laterality: Right;  . CESAREAN SECTION     x4  . DILATION AND CURETTAGE OF UTERUS    . KNEE SURGERY Right   . PORTACATH PLACEMENT N/A 07/17/2017   Procedure: INSERTION PORT-A-CATH WITH [place];  Surgeon: Rolm Bookbinder, MD;  Location: [facility];  Service: General;  Laterality: N/A;    FAMILY HISTORY Family History  Problem Relation Age of Onset  . Breast cancer Mother 72       again at 55 in other breast   . Heart attack Father 51  . Breast cancer Sister 33  . Breast cancer Maternal Grandmother 11       spread to lungs, died at 2  . Ovarian cancer Cousin 81  . Prostate cancer Cousin   The patient's father died at age 38 from a heart attack.  The patient's mother is currently living at age 23 (as of January 2019).  The patient had 1 sister who was diagnosed with breast cancer at age 24 and died from metastatic disease at age 41.  The patient has 1 brother.  In addition the patient's mother was diagnosed with breast cancer at age 43, on the left side, and now has a right-sided breast cancer diagnosed in January 2019.  There is in addition a cousin with ovarian cancer diagnosed when she was 68 years old  GYNECOLOGIC HISTORY:  No LMP recorded. Patient has had a hysterectomy. Menarche age 80, first live birth age 68, the patient had 3 live births, 1 of whom survived only 2 days.  She underwent hysterectomy without salpingo-oophorectomy September 05, 1978.  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  She is  now retired.  Her husband [name] his Theme park manager at [employer].  The patient's daughter [name] lives in [place] where she is an Tourist information centre manager.  The patient's daughter [name] lives in [place] working for the department of defense.  The patient has 3 grandchildren.     ADVANCED DIRECTIVES: Not in place   HEALTH MAINTENANCE: Social History   Tobacco Use  . Smoking status: Never Smoker  . Smokeless tobacco: Never Used  Substance Use Topics  . Alcohol use: Yes    Comment: social  . Drug use: No     Colonoscopy: April 2018/[facility]  PAP: Status post hysterectomy  Bone density:   Allergies  Allergen Reactions  . Tape Itching and Rash  . Nsaids   . Sulfa Antibiotics Rash    Current Outpatient Medications  Medication Sig Dispense Refill  . aspirin 81 MG chewable tablet Chew 4 tablets (324 mg total) by mouth daily. (Patient taking differently: Chew 81 mg by mouth daily. ) 30 tablet 0  . CALCIUM-VITAMIN D  PO Take 1 tablet by mouth daily.    . Coenzyme Q10 (COQ10 PO) Take by mouth daily.    Marland Kitchen lisinopril (PRINIVIL,ZESTRIL) 10 MG tablet     . LORazepam (ATIVAN) 0.5 MG tablet Take 0.5 mg by mouth every 6 (six) hours as needed for anxiety.    . pravastatin (PRAVACHOL) 80 MG tablet Take 1 tablet (80 mg total) by mouth daily at 6 PM. 60 tablet 0  . vitamin B-12 (CYANOCOBALAMIN) 1000 MCG tablet Take 1,000 mcg by mouth daily.     No current facility-administered medications for this visit.     OBJECTIVE: Middle-aged African-American woman in no acute distress  Vitals:   10/09/17 1044  BP: (!) 156/79  Pulse: 77  Resp: 18  Temp: 98.4 F (36.9 C)  SpO2: 98%     Body mass index is 26.78 kg/m.   Wt Readings from Last 3 Encounters:  10/09/17 153 lb 9.6 oz (69.7 kg)  10/09/17 153 lb 9.6 oz (69.7 kg)  10/02/17 154 lb (69.9 kg)      ECOG FS:1 - Symptomatic but completely ambulatory  Sclerae unicteric, EOMs intact Oropharynx clear and moist No cervical or  supraclavicular adenopathy Lungs no rales or rhonchi Heart regular rate and rhythm Abd soft, nontender, positive bowel sounds MSK no focal spinal tenderness, no upper extremity lymphedema Neuro: nonfocal, well oriented, appropriate affect Breasts: I do not palpate a mass in the right breast.  The left breast is benign.  Both axillae are benign  LAB RESULTS:  CMP     Component Value Date/Time   NA 142 10/09/2017 1000   K 4.7 10/09/2017 1000   CL 109 10/09/2017 1000   CO2 24 10/09/2017 1000   GLUCOSE 97 10/09/2017 1000   BUN 15 10/09/2017 1000   CREATININE 0.83 10/09/2017 1000   CREATININE 1.01 06/27/2017 0843   CALCIUM 10.0 10/09/2017 1000   PROT 7.3 10/09/2017 1000   ALBUMIN 3.9 10/09/2017 1000   AST 18 10/09/2017 1000   AST 16 06/27/2017 0843   ALT 15 10/09/2017 1000   ALT 14 06/27/2017 0843   ALKPHOS 77 10/09/2017 1000   BILITOT <0.2 (L) 10/09/2017 1000   BILITOT 0.2 06/27/2017 0843   GFRNONAA >60 10/09/2017 1000   GFRNONAA 56 (L) 06/27/2017 0843   GFRAA >60 10/09/2017 1000   GFRAA >60 06/27/2017 0843    No results found for: TOTALPROTELP, ALBUMINELP, A1GS, A2GS, BETS, BETA2SER, GAMS, MSPIKE, SPEI  No results found for: KPAFRELGTCHN, LAMBDASER, KAPLAMBRATIO  Lab Results  Component Value Date   WBC 5.8 10/09/2017   NEUTROABS 3.2 10/09/2017   HGB 10.0 (L) 10/09/2017   HCT 30.0 (L) 10/09/2017   MCV 87.0 10/09/2017   PLT 324 10/09/2017    _0 @  No results found for: LABCA2  No components found for: [id number]  No results for input(s): INR in the last 168 hours.  No results found for: LABCA2  No results found for: NWG956  No results found for: OZH086  No results found for: VHQ469  No results found for: CA2729  No components found for: HGQUANT  No results found for: CEA1 / No results found for: CEA1   No results found for: AFPTUMOR  No results found for: CHROMOGRNA  No results found for: PSA1  Appointment on 10/09/2017  Component  Date Value Ref Range Status  . Sodium 10/09/2017 142  136 - 145 mmol/L Final  . Potassium 10/09/2017 4.7  3.5 - 5.1 mmol/L Final  . Chloride 10/09/2017 109  98 - 109 mmol/L Final  . CO2 10/09/2017 24  22 - 29 mmol/L Final  . Glucose, Bld 10/09/2017 97  70 - 140 mg/dL Final  . BUN 10/09/2017 15  7 - 26 mg/dL Final  . Creatinine, Ser 10/09/2017 0.83  0.60 - 1.10 mg/dL Final  . Calcium 10/09/2017 10.0  8.4 - 10.4 mg/dL Final  . Total Protein 10/09/2017 7.3  6.4 - 8.3 g/dL Final  . Albumin 10/09/2017 3.9  3.5 - 5.0 g/dL Final  . AST 10/09/2017 18  5 - 34 U/L Final  . ALT 10/09/2017 15  0 - 55 U/L Final  . Alkaline Phosphatase 10/09/2017 77  40 - 150 U/L Final  . Total Bilirubin 10/09/2017 <0.2* 0.2 - 1.2 mg/dL Final  . GFR calc non Af Amer 10/09/2017 >60  >60 mL/min Final  . GFR calc Af Amer 10/09/2017 >60  >60 mL/min Final   Comment: (NOTE) The eGFR has been calculated using the CKD EPI equation. This calculation has not been validated in all clinical situations. eGFR's persistently <60 mL/min signify possible Chronic Kidney Disease.   Georgiann Hahn gap 10/09/2017 9  3 - 11 Final   Performed at [facility], [place] [address] [place], [place] [address]  . WBC 10/09/2017 5.8  3.9 - 10.3 K/uL Final  . RBC 10/09/2017 3.44* 3.70 - 5.45 MIL/uL Final  . Hemoglobin 10/09/2017 10.0* 11.6 - 15.9 g/dL Final  . HCT 10/09/2017 30.0* 34.8 - 46.6 % Final  . MCV 10/09/2017 87.0  79.5 - 101.0 fL Final  . MCH 10/09/2017 29.0  25.1 - 34.0 pg Final  . MCHC 10/09/2017 33.3  31.5 - 36.0 g/dL Final  . RDW 10/09/2017 15.2* 11.2 - 14.5 % Final  . Platelets 10/09/2017 324  145 - 400 K/uL Final  . Neutrophils Relative % 10/09/2017 55  % Final  . Neutro Abs 10/09/2017 3.2  1.5 - 6.5 K/uL Final  . Lymphocytes Relative 10/09/2017 30  % Final  . Lymphs Abs 10/09/2017 1.7  0.9 - 3.3 K/uL Final  . Monocytes Relative 10/09/2017 12  % Final  . Monocytes Absolute 10/09/2017 0.7  0.1 - 0.9 K/uL  Final  . Eosinophils Relative 10/09/2017 2  % Final  . Eosinophils Absolute 10/09/2017 0.1  0.0 - 0.5 K/uL Final  . Basophils Relative 10/09/2017 1  % Final  . Basophils Absolute 10/09/2017 0.1  0.0 - 0.1 K/uL Final   Performed at [facility], [address] [place],  [address]    (this displays the last labs from the last 3 days)  No results found for: TOTALPROTELP, ALBUMINELP, A1GS, A2GS, BETS, BETA2SER, GAMS, MSPIKE, SPEI (this displays SPEP labs)  No results found for: KPAFRELGTCHN, LAMBDASER, KAPLAMBRATIO (kappa/lambda light chains)  No results found for: HGBA, HGBA2QUANT, HGBFQUANT, HGBSQUAN (Hemoglobinopathy evaluation)   No results found for: LDH  No results found for: IRON, TIBC, IRONPCTSAT (Iron and TIBC)  No results found for: FERRITIN  Urinalysis    Component Value Date/Time   COLORURINE COLORLESS (A) 08/09/2017 [place] 08/09/2017 2224   LABSPEC 1.004 (L) 08/09/2017 2224   PHURINE 6.0 08/09/2017 [place] 08/09/2017 2224   HGBUR NEGATIVE 08/09/2017 2224   BILIRUBINUR NEGATIVE 08/09/2017 2224   KETONESUR NEGATIVE 08/09/2017 2224   PROTEINUR NEGATIVE 08/09/2017 2224   NITRITE NEGATIVE 08/09/2017 2224   LEUKOCYTESUR NEGATIVE 08/09/2017 2224     STUDIES: She had an echocardiogram completed on 08/10/2017 with  a left ventricular ejection fraction of 65-70%.   ELIGIBLE FOR AVAILABLE RESEARCH PROTOCOL: no  ASSESSMENT: 67 y.o. [place] woman status post right breast upper outer quadrant biopsy 06/20/2017 for a clinical T1b N0, stage IA invasive ductal carcinoma, grade 3, estrogen and progesterone receptor negative, but HER-2 amplified, with an MIB-1 of 30%  (1) genetics testing 07/05/2017 through the Common Hereditary Cancer Panel offered by [facility] found no deleterious mutations in APC, ATM, AXIN2, BARD1, BMPR1A, BRCA1, BRCA2, BRIP1, CDH1, CDKN2A (p14ARF), CDKN2A (p16INK4a), CKD4,  CHEK2, CTNNA1, DICER1, EPCAM (Deletion/duplication testing only), GREM1 (promoter region deletion/duplication testing only), KIT, MEN1, MLH1, MSH2, MSH3, MSH6, MUTYH, NBN, NF1, NHTL1, PALB2, PDGFRA, PMS2, POLD1, POLE, PTEN, RAD50, RAD51C, RAD51D, SDHB, SDHC, SDHD, SMAD4, SMARCA4. STK11, TP53, TSC1, TSC2, and VHL.  The following genes were evaluated for sequence changes only: SDHA and HOXB13 c.251G>A variant only.  (a) A Variant of uncertain significance in MSH2 was identified c.1331G>T (p.Arg444Leu).   (2) status post right lumpectomy and sentinel lymph node sampling 07/17/2017 for a pT1c pN0, stage IA invasive ductal carcinoma, grade 2, with negative margins.  A total of 5 lymph nodes were removed  (3) adjuvant chemotherapy will consist of paclitaxel weekly x12 together with trastuzumab every 21 days starting 08/07/2017  (a) paclitaxel stopped after 8 doses because of neuropathy (last dose 09/25/2017  (4) trastuzumab will be continued to total 1 year  (a) echo 08/10/2017 showed an ejection fraction in the 65-70% range  5) adjuvant radiation pending  PLAN: Iyauna is done with her chemotherapy.  Fortunately her peripheral neuropathy is very minimal and it is also improving.  She has some carpal tunnel symptoms on the left and those are also better since she started using a wrist splint.  The plan will be to continue the trastuzumab for a total of 1 year.  That will take [place] through February 2020  She will need a repeat echocardiogram sometime in May.  She is now ready to start her radiation.  She met with Dr. Lanell Persons today.  Hopefully she will be done with radiation sometime in May.  She will see me again mid July.  At that time we will start observation  She knows to call for any other issues that may develop before the next visit.  Kyan Yurkovich, Virgie Dad, MD  10/09/17 11:13 AM Medical Oncology and Hematology [facility] [address] [place],  [address] Tel.  [phone]    Fax. [phone]    This document serves as a record of services personally performed by Lurline Del, MD. It was created on his behalf by Steva Colder, a trained medical scribe. The creation of this record is based on the scribe's personal observations and the provider's statements to them.   I have reviewed the above documentation for accuracy and completeness, and I agree with the above.

## 2017-10-09 ENCOUNTER — Other Ambulatory Visit: Payer: Medicare Other

## 2017-10-09 ENCOUNTER — Ambulatory Visit
Admission: RE | Admit: 2017-10-09 | Discharge: 2017-10-09 | Disposition: A | Discharge status: Home / Self Care | Payer: Medicare Other | Source: Ambulatory Visit | Attending: Radiation Oncology | Admitting: Radiation Oncology

## 2017-10-09 ENCOUNTER — Inpatient Hospital Stay: Payer: Medicare Other

## 2017-10-09 ENCOUNTER — Other Ambulatory Visit: Payer: Self-pay

## 2017-10-09 ENCOUNTER — Inpatient Hospital Stay (HOSPITAL_BASED_OUTPATIENT_CLINIC_OR_DEPARTMENT_OTHER): Payer: Medicare Other | Admitting: Oncology

## 2017-10-09 ENCOUNTER — Encounter: Payer: Self-pay | Admitting: Radiation Oncology

## 2017-10-09 ENCOUNTER — Telehealth: Payer: Self-pay

## 2017-10-09 VITALS — BP 156/79 | HR 77 | Temp 98.4°F | Resp 18 | Ht 63.5 in | Wt 153.6 lb

## 2017-10-09 VITALS — BP 165/69 | HR 74 | Temp 98.6°F | Resp 18 | Wt 153.6 lb

## 2017-10-09 DIAGNOSIS — G620 Drug-induced polyneuropathy: Secondary | ICD-10-CM

## 2017-10-09 DIAGNOSIS — Z171 Estrogen receptor negative status [ER-]: Principal | ICD-10-CM

## 2017-10-09 DIAGNOSIS — Z9221 Personal history of antineoplastic chemotherapy: Secondary | ICD-10-CM | POA: Insufficient documentation

## 2017-10-09 DIAGNOSIS — Z95828 Presence of other vascular implants and grafts: Secondary | ICD-10-CM

## 2017-10-09 DIAGNOSIS — C50411 Malignant neoplasm of upper-outer quadrant of right female breast: Secondary | ICD-10-CM

## 2017-10-09 DIAGNOSIS — G629 Polyneuropathy, unspecified: Secondary | ICD-10-CM | POA: Diagnosis not present

## 2017-10-09 DIAGNOSIS — Z7982 Long term (current) use of aspirin: Secondary | ICD-10-CM | POA: Diagnosis not present

## 2017-10-09 DIAGNOSIS — Z79899 Other long term (current) drug therapy: Secondary | ICD-10-CM | POA: Insufficient documentation

## 2017-10-09 DIAGNOSIS — I10 Essential (primary) hypertension: Secondary | ICD-10-CM | POA: Diagnosis not present

## 2017-10-09 DIAGNOSIS — Z5112 Encounter for antineoplastic immunotherapy: Secondary | ICD-10-CM | POA: Diagnosis not present

## 2017-10-09 LAB — COMPREHENSIVE METABOLIC PANEL WITH GFR
ALT: 15 U/L (ref 0–55)
AST: 18 U/L (ref 5–34)
Albumin: 3.9 g/dL (ref 3.5–5.0)
Alkaline Phosphatase: 77 U/L (ref 40–150)
Anion gap: 9 (ref 3–11)
BUN: 15 mg/dL (ref 7–26)
CO2: 24 mmol/L (ref 22–29)
Calcium: 10 mg/dL (ref 8.4–10.4)
Chloride: 109 mmol/L (ref 98–109)
Creatinine, Ser: 0.83 mg/dL (ref 0.60–1.10)
GFR calc Af Amer: >60 mL/min
GFR calc non Af Amer: >60 mL/min
Glucose, Bld: 97 mg/dL (ref 70–140)
Potassium: 4.7 mmol/L (ref 3.5–5.1)
Sodium: 142 mmol/L (ref 136–145)
Total Bilirubin: <0.2 mg/dL — ABNORMAL LOW (ref 0.2–1.2)
Total Protein: 7.3 g/dL (ref 6.4–8.3)

## 2017-10-09 LAB — CBC WITH DIFFERENTIAL/PLATELET
Basophils Absolute: 0.1 10*3/uL (ref 0.0–0.1)
Basophils Relative: 1 %
Eosinophils Absolute: 0.1 10*3/uL (ref 0.0–0.5)
Eosinophils Relative: 2 %
HCT: 30 % — ABNORMAL LOW (ref 34.8–46.6)
Hemoglobin: 10 g/dL — ABNORMAL LOW (ref 11.6–15.9)
Lymphocytes Relative: 30 %
Lymphs Abs: 1.7 10*3/uL (ref 0.9–3.3)
MCH: 29 pg (ref 25.1–34.0)
MCHC: 33.3 g/dL (ref 31.5–36.0)
MCV: 87 fL (ref 79.5–101.0)
Monocytes Absolute: 0.7 10*3/uL (ref 0.1–0.9)
Monocytes Relative: 12 %
Neutro Abs: 3.2 10*3/uL (ref 1.5–6.5)
Neutrophils Relative %: 55 %
Platelets: 324 10*3/uL (ref 145–400)
RBC: 3.44 MIL/uL — ABNORMAL LOW (ref 3.70–5.45)
RDW: 15.2 % — ABNORMAL HIGH (ref 11.2–14.5)
WBC: 5.8 10*3/uL (ref 3.9–10.3)

## 2017-10-09 MED ORDER — DIPHENHYDRAMINE HCL 25 MG PO CAPS
ORAL_CAPSULE | ORAL | Status: AC
  Filled 2017-10-09: qty 2

## 2017-10-09 MED ORDER — FAMOTIDINE IN NACL 20-0.9 MG/50ML-% IV SOLN
20.0000 mg | Freq: Once | INTRAVENOUS | Status: AC
  Administered 2017-10-09: 20 mg via INTRAVENOUS

## 2017-10-09 MED ORDER — SODIUM CHLORIDE 0.9% FLUSH
10.0000 mL | INTRAVENOUS | Status: DC | PRN
  Administered 2017-10-09: 10 mL
  Filled 2017-10-09: qty 10

## 2017-10-09 MED ORDER — SODIUM CHLORIDE 0.9 % IV SOLN
Freq: Once | INTRAVENOUS | Status: AC
  Administered 2017-10-09: 12:00:00 via INTRAVENOUS

## 2017-10-09 MED ORDER — TRASTUZUMAB CHEMO 150 MG IV SOLR
450.0000 mg | Freq: Once | INTRAVENOUS | Status: AC
  Administered 2017-10-09: 450 mg via INTRAVENOUS
  Filled 2017-10-09: qty 21.43

## 2017-10-09 MED ORDER — DIPHENHYDRAMINE HCL 25 MG PO CAPS
50.0000 mg | ORAL_CAPSULE | Freq: Once | ORAL | Status: AC
  Administered 2017-10-09: 50 mg via ORAL

## 2017-10-09 MED ORDER — ACETAMINOPHEN 325 MG PO TABS
650.0000 mg | ORAL_TABLET | Freq: Once | ORAL | Status: AC
  Administered 2017-10-09: 650 mg via ORAL

## 2017-10-09 MED ORDER — FAMOTIDINE IN NACL 20-0.9 MG/50ML-% IV SOLN
INTRAVENOUS | Status: AC
  Filled 2017-10-09: qty 50

## 2017-10-09 MED ORDER — HEPARIN SOD (PORK) LOCK FLUSH 100 UNIT/ML IV SOLN
500.0000 [IU] | Freq: Once | INTRAVENOUS | Status: AC | PRN
  Administered 2017-10-09: 500 [IU]
  Filled 2017-10-09: qty 5

## 2017-10-09 MED ORDER — ACETAMINOPHEN 325 MG PO TABS
ORAL_TABLET | ORAL | Status: AC
  Filled 2017-10-09: qty 2

## 2017-10-09 NOTE — Progress Notes (Deleted)
  Radiation Oncology         [phone] ________________________________  Name: April [name] MRN: [id number]  Date: 10/09/2017  DOB: 06-Apr-1951  SIMULATION AND TREATMENT PLANNING NOTE    outpatient  DIAGNOSIS:     ICD-10-CM   1. Malignant neoplasm of upper-outer quadrant of right breast in female, estrogen receptor negative ([place]) C50.411    Z17.1     NARRATIVE:  The patient was brought to the [facility].  Identity was confirmed.  All relevant records and images related to the planned course of therapy were reviewed.  The patient freely provided informed written consent to proceed with treatment after reviewing the details related to the planned course of therapy. The consent form was witnessed and verified by the simulation staff.    Then, the patient was set-up in a stable reproducible supine position for radiation therapy with her ipsilateral arm over her head, and her upper body secured in a custom-made Vac-lok device.  CT images were obtained.  Surface markings were placed.  The CT images were loaded into the planning software.    TREATMENT PLANNING NOTE: Treatment planning then occurred.  The radiation prescription was entered and confirmed.     A total of 3 medically necessary complex treatment devices were fabricated and supervised by me: 2 fields with MLCs for custom blocks to protect heart, and lungs;  and, a Vac-lok. MORE COMPLEX DEVICES MAY BE MADE IN DOSIMETRY FOR FIELD IN FIELD BEAMS FOR DOSE HOMOGENEITY.  I have requested : 3D Simulation which is medically necessary to give adequate dose to at risk tissues while sparing lungs and heart.  I have requested a DVH of the following structures: lungs, heart, lumpectomy cavity.    The patient will receive 40.05 Gy in 15 fractions to the right breast with 2 tangential fields.   This will be followed by a boost.  Optical Surface Tracking Plan:  Since intensity modulated radiotherapy (IMRT) and 3D conformal  radiation treatment methods are predicated on accurate and precise positioning for treatment, intrafraction motion monitoring is medically necessary to ensure accurate and safe treatment delivery. The ability to quantify intrafraction motion without excessive ionizing radiation dose can only be performed with optical surface tracking. Accordingly, surface imaging offers the opportunity to obtain 3D measurements of patient position throughout IMRT and 3D treatments without excessive radiation exposure. I am ordering optical surface tracking for this patient's upcoming course of radiotherapy.  ________________________________   Reference:  Ursula Alert, J, et al. Surface imaging-based analysis of intrafraction motion for breast radiotherapy patients.Journal of [place], n. 6, nov. 2014. ISSN 91791505.  Available at: <http://www.jacmp.org/index.php/jacmp/article/view/4957>.    -----------------------------------  Eppie Gibson, MD

## 2017-10-09 NOTE — Telephone Encounter (Signed)
Per Tiffany RN, pt is in infusion today and is receiving Herceptin and premed Benadryl but pt says it gives her a lump in her stomach and would like the premed Pepcid to go along with her Benadryl even though pt doesn't receive Taxol today.  Per Wilber Bihari NP - pt can have the premed Pepcid as previously ordered with her benadryl today.  Notified Child psychotherapist.

## 2017-10-09 NOTE — Patient Instructions (Addendum)
[facility] Discharge Instructions for Patients Receiving Chemotherapy Today you received the following chemotherapy agents:  Herceptin To help prevent nausea and vomiting after your treatment, we encourage you to take your nausea medication as prescribed.   If you develop nausea and vomiting that is not controlled by your nausea medication, call the clinic.   BELOW ARE SYMPTOMS THAT SHOULD BE REPORTED IMMEDIATELY:  *FEVER GREATER THAN 100.5 F  *CHILLS WITH OR WITHOUT FEVER  NAUSEA AND VOMITING THAT IS NOT CONTROLLED WITH YOUR NAUSEA MEDICATION  *UNUSUAL SHORTNESS OF BREATH  *UNUSUAL BRUISING OR BLEEDING  TENDERNESS IN MOUTH AND THROAT WITH OR WITHOUT PRESENCE OF ULCERS  *URINARY PROBLEMS  *BOWEL PROBLEMS  UNUSUAL RASH Items with * indicate a potential emergency and should be followed up as soon as possible.  Feel free to call the clinic should you have any questions or concerns. The clinic phone number is [phone].  Please show the CHEMO ALERT CARD at check-in to the Emergency Department and triage nurse.   

## 2017-10-09 NOTE — Progress Notes (Signed)
Radiation Oncology         (336) [phone] ________________________________  Name: April [name] MRN: [id number]  Date: 10/09/2017  DOB: 1950-07-21  Follow-Up Visit Note  Outpatient  CC: Kelton Pillar, MD  Magrinat, Virgie Dad, MD  Diagnosis:      ICD-10-CM   1. Malignant neoplasm of upper-outer quadrant of right breast in female, estrogen receptor negative ([place]) C50.411    Z17.1     Stage IA pT1cN0 Right Breast UOQ Invasive Ductal Carcinoma with DCIS, ER(-) / PR(-) / Her2(+), Grade 3  CHIEF COMPLAINT: Here to discuss management of her right breast cancer  Narrative:  The patient returns today for follow-up.     Since consultation, she underwent right lumpectomy by Dr. Donne Hazel on 07/17/17. Pathology showed DCIS, low grade as well as invasive ductal carcinoma, Grade II, spanning 1.6 cm with characteristics as described above. All margins were clear by at least 2 mm. 0/5 lymph nodes were positive for carcinoma. Genetic testing found no deleterious mutations.   Dr. Jana Hakim placed the patient on paclitaxel weekly x12 with trastuzumab every 21 days beginning 08/07/17. Paclitaxel was discontinued on 09/25/17 due to peripheral neuropathy. Trastuzumab will be continued for a total of one year.  The patient has been referred back to radiation oncology to discuss the role of radiation therapy in the treatment of her breast cancer.  On review of systems, the patient denies lymphedema or any pain. She does endorse peripheral neuropathy since beginning chemotherapy. She is hypertensive today but does endorse lisinopril this morning.           ALLERGIES:  is allergic to tape; nsaids; antihistamines, diphenhydramine-type; and sulfa antibiotics.  Meds: Current Outpatient Medications  Medication Sig Dispense Refill  . aspirin 81 MG chewable tablet Chew 4 tablets (324 mg total) by mouth daily. (Patient taking differently: Chew 81 mg by mouth daily. ) 30 tablet 0  . CALCIUM-VITAMIN D PO Take 1  tablet by mouth daily.    . Coenzyme Q10 (COQ10 PO) Take by mouth daily.    Marland Kitchen lisinopril (PRINIVIL,ZESTRIL) 10 MG tablet     . LORazepam (ATIVAN) 0.5 MG tablet Take 0.5 mg by mouth every 6 (six) hours as needed for anxiety.    . pravastatin (PRAVACHOL) 80 MG tablet Take 1 tablet (80 mg total) by mouth daily at 6 PM. 60 tablet 0  . vitamin B-12 (CYANOCOBALAMIN) 1000 MCG tablet Take 1,000 mcg by mouth daily.     No current facility-administered medications for this encounter.     Physical Findings:  weight is 153 lb 9.6 oz (69.7 kg). Her oral temperature is 98.6 F (37 C). Her blood pressure is 165/69 (abnormal) and her pulse is 74. Her respiration is 18 and oxygen saturation is 100%. .     General: Alert and oriented, in no acute distress Neck: Neck is supple, no palpable cervical or supraclavicular lymphadenopathy. Heart: Regular in rate and rhythm with no murmurs, rubs, or gallops. Chest: Clear to auscultation bilaterally, with no rhonchi, wheezes, or rales. Lymphatics: see Neck Exam Psychiatric: Judgment and insight are intact. Affect is appropriate. Vascular: Portacath in right upper chest. Breast exam reveals right breast axillary and lumpectomy scars have healed well.   Lab Findings: Lab Results  Component Value Date   WBC 5.8 10/09/2017   HGB 10.0 (L) 10/09/2017   HCT 30.0 (L) 10/09/2017   MCV 87.0 10/09/2017   PLT 324 10/09/2017    _0 @  Radiographic Findings: No results found.  Impression/Plan: We  discussed adjuvant radiotherapy today.  I recommend radiation therapy directed at the right breast in order to reduce the risk of locoregional recurrence by 2/3s.  The risks, benefits and side effects of this treatment were discussed in detail.  She understands that radiotherapy is associated with skin irritation and fatigue in the acute setting. Late effects can include cosmetic changes and rare injury to internal organs.   She is enthusiastic about proceeding with  treatment. A consent form has been signed and placed in her chart.  We will simulate her tomorrow and begin treatment in about a week.  A total of 3 medically necessary complex treatment devices will be fabricated and supervised by me: 2 fields with MLCs for custom blocks to protect heart, and lungs;  and, a Vac-lok. MORE COMPLEX DEVICES MAY BE MADE IN DOSIMETRY FOR FIELD IN FIELD BEAMS FOR DOSE HOMOGENEITY.  I have requested : 3D Simulation which is medically necessary to give adequate dose to at risk tissues while sparing lungs and heart.  I have requested a DVH of the following structures: lungs, heart,  lumpectomy cavity.    The patient will receive 40.05 Gy in 15 fractions to the right breast with 2 fields.  This will be  followed by a boost.  Will defer to PCP on hypertensive management.  I spent over 25 minutes minutes face to face with the patient and more than 50% of that time was spent in counseling and/or coordination of care.    _____________________________________   Eppie Gibson, MD  This document serves as a record of services personally performed by Eppie Gibson, MD. It was created on his behalf by Linward Natal, a trained medical scribe. The creation of this record is based on the scribe's personal observations and the provider's statements to them. This document has been checked and approved by the attending provider.

## 2017-10-10 ENCOUNTER — Encounter: Payer: Self-pay | Admitting: Neurology

## 2017-10-10 ENCOUNTER — Ambulatory Visit
Admission: RE | Admit: 2017-10-10 | Discharge: 2017-10-10 | Disposition: A | Discharge status: Home / Self Care | Payer: Medicare Other | Source: Ambulatory Visit | Attending: Radiation Oncology | Admitting: Radiation Oncology

## 2017-10-10 ENCOUNTER — Encounter: Payer: Self-pay | Admitting: Radiation Oncology

## 2017-10-10 ENCOUNTER — Ambulatory Visit: Payer: BC Managed Care – PPO | Admitting: Neurology

## 2017-10-10 VITALS — BP 166/84 | HR 86 | Ht 63.0 in | Wt 154.4 lb

## 2017-10-10 DIAGNOSIS — Z510 Encounter for antineoplastic radiation therapy: Secondary | ICD-10-CM | POA: Insufficient documentation

## 2017-10-10 DIAGNOSIS — Z171 Estrogen receptor negative status [ER-]: Secondary | ICD-10-CM | POA: Insufficient documentation

## 2017-10-10 DIAGNOSIS — C50411 Malignant neoplasm of upper-outer quadrant of right female breast: Secondary | ICD-10-CM | POA: Insufficient documentation

## 2017-10-10 DIAGNOSIS — G459 Transient cerebral ischemic attack, unspecified: Secondary | ICD-10-CM | POA: Diagnosis not present

## 2017-10-10 NOTE — Patient Instructions (Addendum)
I had a long d/w patient and her husband about her recent  TIa versus hypertensive urgency episode, risk for recurrent stroke/TIAs, personally independently reviewed imaging studies and stroke evaluation results and answered questions.Continue aspirin 81 mg daily  for secondary stroke prevention and maintain strict control of hypertension with blood pressure goal below 130/90, diabetes with hemoglobin A1c goal below 6.5% and lipids with LDL cholesterol goal below 70 mg/dL. I also advised the patient to eat a healthy diet with plenty of whole grains, cereals, fruits and vegetables, exercise regularly and maintain ideal body weight I advised the patient to maintain a strict log of daily blood pressure recordings and to discuss  this with her primary physician and upcoming visit to consider increasing blood pressure medicines. I also expect her tingling numbness in her hands to improve as it is likely small fiber neuropathy related to chemotherapy which she has just finished..Followup in the future with my nurse practitioner Janett Billow in 6 months or call earlier if necessary

## 2017-10-10 NOTE — Progress Notes (Signed)
[facility] [address] [place]. [place] [address] [phone]       OFFICE FOLLOW-UP NOTE  [name] Date of Birth:  [birth year] Medical Record Number:  [id number]   HPI:  [name] is a .29 year  African american lady seen today for the initial office follow-up visit following hospital admission for TIA in..Feb 2019.History obtained from   the patient and  review of electronic medical records. I have personally reviewed the available imaging films.April [name] is a 67 y.o. female with a history of grade 3 invasive ductal carcinoma, stage IA. She was in her normal state of health until tonight around 8:00 pm when she began slurring her words.  This is been coming and going since it began, sometimes being almost completely gone and sometimes being very prominent.  She has always had at least a mild left facial droop since it began, however. She was started chemotherapy with Paclitaxel and Trastuzumab on Tuesday. Patient was found to have elevated blood pressure 232/83 on admission LKW: 8:00 PM on 08/09/17 tpa given?: no, mild symptoms Premorbid modified rankin scale: 0.MRI scan of the brain was negative for acute infarct. MRA of the brain was unremarkable. Carotid ultrasound showed no significant extracranial stenosis. LDL cholesterol was elevated 160. Hemoglobin A1c was normal. Patient was started on aspirin and statin. She states she's done well since discharge. Her blood pressure still remains in the 150-160 range despite being started on lisinopril 10 mg daily. She is tolerating Pravachol 80 mg well but does have muscle aches and pains which may have been related to her chemotherapy which finished a week ago. She does complain of tingling numbness in the fingertips to likely from small fiber neuropathy from her chemotherapy. She is tolerating aspirin well without bleeding or bruising. She has had no recurrent stroke or TIA symptoms. He plans to see  primary care physician in a few weeks and has been advised to keep log of her blood pressure recordings.     ROS:   14 system review of systems is positive for  Eye discharge, blurred vision, cough, palpitations, numbness and all other systems negative  PMH:  Past Medical History:  Diagnosis Date  . Breast cancer ([place]) 05/2017   right breast  . Eczema   . Family history of breast cancer   . Family history of ovarian cancer   . Hypertension    toxemia during pregnancy, no meds now    Social History:  Social History   Socioeconomic History  . Marital status: Married    Spouse name: Not on file  . Number of children: Not on file  . Years of education: Not on file  . Highest education level: Not on file  Occupational History  . Not on file  Social Needs  . Financial resource strain: Not on file  . Food insecurity:    Worry: Not on file    Inability: Not on file  . Transportation needs:    Medical: Not on file    Non-medical: Not on file  Tobacco Use  . Smoking status: Never Smoker  . Smokeless tobacco: Never Used  Substance and Sexual Activity  . Alcohol use: Yes    Comment: social  . Drug use: No  . Sexual activity: Not Currently  Lifestyle  . Physical activity:    Days per week: Not on file    Minutes per session: Not on file  . Stress: Not on file  Relationships  .  Social connections:    Talks on phone: Not on file    Gets together: Not on file    Attends religious service: Not on file    Active member of club or organization: Not on file    Attends meetings of clubs or organizations: Not on file    Relationship status: Not on file  . Intimate partner violence:    Fear of current or ex partner: Not on file    Emotionally abused: Not on file    Physically abused: Not on file    Forced sexual activity: Not on file  Other Topics Concern  . Not on file  Social History Narrative  . Not on file    Medications:   Current Outpatient Medications on File  Prior to Visit  Medication Sig Dispense Refill  . aspirin 81 MG chewable tablet Chew 4 tablets (324 mg total) by mouth daily. (Patient taking differently: Chew 81 mg by mouth daily. ) 30 tablet 0  . CALCIUM-VITAMIN D PO Take 1 tablet by mouth daily.    . Coenzyme Q10 (COQ10 PO) Take by mouth daily.    Marland Kitchen lisinopril (PRINIVIL,ZESTRIL) 10 MG tablet     . pravastatin (PRAVACHOL) 80 MG tablet Take 1 tablet (80 mg total) by mouth daily at 6 PM. 60 tablet 0  . vitamin B-12 (CYANOCOBALAMIN) 1000 MCG tablet Take 1,000 mcg by mouth daily.    . [DISCONTINUED] prochlorperazine (COMPAZINE) 10 MG tablet Take 1 tablet (10 mg total) by mouth every 6 (six) hours as needed (Nausea or vomiting). 30 tablet 1   No current facility-administered medications on file prior to visit.     Allergies:   Allergies  Allergen Reactions  . Tape Itching and Rash  . Nsaids   . Antihistamines, Diphenhydramine-Type Other (See Comments)    Pt stated she gets lump in throat and stomach when taking histamines.   . Sulfa Antibiotics Rash    Physical Exam General: well developed, well nourished pleasant middle-aged African-American lady, seated, in no evident distress Head: head normocephalic and atraumatic.  Neck: supple with no carotid or supraclavicular bruits Cardiovascular: regular rate and rhythm, no murmurs Musculoskeletal: no deformity Skin:  no rash/petichiae Vascular:  Normal pulses all extremities Vitals:   10/10/17 0840  BP: (!) 166/84  Pulse: 86   Neurologic Exam Mental Status: Awake and fully alert. Oriented to place and time. Recent and remote memory intact. Attention span, concentration and fund of knowledge appropriate. Mood and affect appropriate.  Cranial Nerves: Fundoscopic exam reveals sharp disc margins. Pupils equal, briskly reactive to light. Extraocular movements full without nystagmus. Visual fields full to confrontation. Hearing intact. Facial sensation intact. Face, tongue, palate moves  normally and symmetrically.  Motor: Normal bulk and tone. Normal strength in all tested extremity muscles. Sensory.: intact to touch ,pinprick .position and vibratory sensation.  Coordination: Rapid alternating movements normal in all extremities. Finger-to-nose and heel-to-shin performed accurately bilaterally. Gait and Station: Arises from chair without difficulty. Stance is normal. Gait demonstrates normal stride length and balance . Able to heel, toe and tandem walk without difficulty.  Reflexes: 1+ and symmetric. Toes downgoing.   NIHSS  0 Modified Rankin  1   ASSESSMENT: 67 year old African-American lady with transient episodes of slurred speech and facial droop in the setting of hypertensive urgency possible TIAs due to small vessel disease. She also has hand paresthesias likely from small fiber neuropathy related to recent chemotherapy. Vascular risk factors of hypertension, hyperlipidemia and mild obesity  PLAN: I had a long d/w patient and her husband about her recent  TIa versus hypertensive urgency episode, risk for recurrent stroke/TIAs, personally independently reviewed imaging studies and stroke evaluation results and answered questions.Continue aspirin 81 mg daily  for secondary stroke prevention and maintain strict control of hypertension with blood pressure goal below 130/90, diabetes with hemoglobin A1c goal below 6.5% and lipids with LDL cholesterol goal below 70 mg/dL. I also advised the patient to eat a healthy diet with plenty of whole grains, cereals, fruits and vegetables, exercise regularly and maintain ideal body weight I advised the patient to maintain a strict log of daily blood pressure recordings and to discuss  this with her primary physician and upcoming visit to consider increasing blood pressure medicines. I also expect her tingling numbness in her hands to improve as it is likely small fiber neuropathy related to chemotherapy which she has just  finished..Followup in the future with my nurse practitioner Janett Billow in 6 months or call earlier if necessary.Greater than 50% of time during this 25 minute visit was spent on counseling,explanation of diagnosis TIA, paresthesias, planning of further management, discussion with patient and family and coordination of care Antony Contras, MD  [facility] [address] [place], [place] [address]  Phone [phone] Fax [phone] Note: This document was prepared with digital dictation and possible smart phrase technology. Any transcriptional errors that result from this process are unintentional

## 2017-10-10 NOTE — Progress Notes (Signed)
  Radiation Oncology         (336) [phone] ________________________________  Name: April [name] MRN: [id number]  Date: 10/10/2017  DOB: 1951/03/24  SIMULATION AND TREATMENT PLANNING NOTE    Outpatient  DIAGNOSIS:     ICD-10-CM   1. Malignant neoplasm of upper-outer quadrant of right breast in female, estrogen receptor negative ([place]) C50.411    Z17.1   Cancer Staging Malignant neoplasm of upper-outer quadrant of right breast in female, estrogen receptor negative ([place]) Staging form: Breast, AJCC 8th Edition - Clinical stage from 06/27/2017: Stage IA (cT1b, cN0, cM0, G3, ER: Negative, PR: Negative, HER2: Positive) - Signed by Eppie Gibson, MD on 06/27/2017 Staging comments: Staged at breast conference on 1.9.19    NARRATIVE:  The patient was brought to the [facility].  Identity was confirmed.  All relevant records and images related to the planned course of therapy were reviewed.  The patient freely provided informed written consent to proceed with treatment after reviewing the details related to the planned course of therapy. The consent form was witnessed and verified by the simulation staff.    Then, the patient was set-up in a stable reproducible supine position for radiation therapy with her ipsilateral arm over her head, and her upper body secured in a custom-made Vac-lok device.  CT images were obtained.  Surface markings were placed.  The CT images were loaded into the planning software.    TREATMENT PLANNING NOTE: Treatment planning then occurred.  The radiation prescription was entered and confirmed.     A total of 3 medically necessary complex treatment devices were fabricated and supervised by me: 2 fields with MLCs for custom blocks to protect heart, and lungs;  and, a Vac-lok. MORE COMPLEX DEVICES MAY BE MADE IN DOSIMETRY FOR FIELD IN FIELD BEAMS FOR DOSE HOMOGENEITY.  I have requested : 3D Simulation which is medically necessary to give adequate dose to at  risk tissues while sparing lungs and heart.  I have requested a DVH of the following structures: lungs, heart, lumpectomy cavity.    The patient will receive 40.05 Gy in 15 fractions to the right breast with 2 tangential fields.  This will be followed by a boost.  Optical Surface Tracking Plan:  Since intensity modulated radiotherapy (IMRT) and 3D conformal radiation treatment methods are predicated on accurate and precise positioning for treatment, intrafraction motion monitoring is medically necessary to ensure accurate and safe treatment delivery. The ability to quantify intrafraction motion without excessive ionizing radiation dose can only be performed with optical surface tracking. Accordingly, surface imaging offers the opportunity to obtain 3D measurements of patient position throughout IMRT and 3D treatments without excessive radiation exposure. I am ordering optical surface tracking for this patient's upcoming course of radiotherapy.  ________________________________   Reference:  Ursula Alert, J, et al. Surface imaging-based analysis of intrafraction motion for breast radiotherapy patients.Journal of [place], n. 6, nov. 2014. ISSN [id number].  Available at: <http://www.jacmp.org/index.php/jacmp/article/view/4957>.    -----------------------------------  Eppie Gibson, MD

## 2017-10-11 ENCOUNTER — Telehealth: Payer: Self-pay | Admitting: Neurology

## 2017-10-11 ENCOUNTER — Other Ambulatory Visit: Payer: Self-pay

## 2017-10-11 MED ORDER — PRAVASTATIN SODIUM 80 MG PO TABS: 80.0000 mg | ORAL_TABLET | Freq: Every day | ORAL | 0 refills | Status: DC

## 2017-10-11 NOTE — Telephone Encounter (Signed)
Rn call patient about needing refill for pravastatin. Rn stated a 30 day refill can be done. Pt has an appt in May 2019 with her primary doctor. Rn advised pt to notify her MD at visit in May to start refilling the medication. Pt verbalized understanding.

## 2017-10-11 NOTE — Telephone Encounter (Signed)
Pt requesting a refill for pravastatin (PRAVACHOL) 80 MG tablet sent to [facility]. Pt unsure if Dr. Leonie Man is willing to pick up this medication for her. Please call to advise

## 2017-10-12 DIAGNOSIS — C50411 Malignant neoplasm of upper-outer quadrant of right female breast: Secondary | ICD-10-CM | POA: Diagnosis not present

## 2017-10-16 ENCOUNTER — Ambulatory Visit: Payer: Medicare Other

## 2017-10-16 ENCOUNTER — Other Ambulatory Visit: Payer: Medicare Other

## 2017-10-17 ENCOUNTER — Ambulatory Visit
Admission: RE | Admit: 2017-10-17 | Discharge: 2017-10-17 | Disposition: A | Discharge status: Home / Self Care | Payer: Medicare Other | Source: Ambulatory Visit | Attending: Radiation Oncology | Admitting: Radiation Oncology

## 2017-10-17 DIAGNOSIS — Z510 Encounter for antineoplastic radiation therapy: Secondary | ICD-10-CM | POA: Insufficient documentation

## 2017-10-17 DIAGNOSIS — Z170 Estrogen receptor positive status [ER+]: Secondary | ICD-10-CM | POA: Insufficient documentation

## 2017-10-17 DIAGNOSIS — C50411 Malignant neoplasm of upper-outer quadrant of right female breast: Secondary | ICD-10-CM | POA: Insufficient documentation

## 2017-10-18 ENCOUNTER — Ambulatory Visit
Admission: RE | Admit: 2017-10-18 | Discharge: 2017-10-18 | Disposition: A | Discharge status: Home / Self Care | Payer: Medicare Other | Source: Ambulatory Visit | Attending: Radiation Oncology | Admitting: Radiation Oncology

## 2017-10-18 DIAGNOSIS — C50411 Malignant neoplasm of upper-outer quadrant of right female breast: Secondary | ICD-10-CM | POA: Diagnosis not present

## 2017-10-18 DIAGNOSIS — Z171 Estrogen receptor negative status [ER-]: Principal | ICD-10-CM

## 2017-10-18 MED ORDER — RADIAPLEXRX EX GEL
Freq: Two times a day (BID) | CUTANEOUS | Status: DC
  Administered 2017-10-18: 12:00:00 via TOPICAL

## 2017-10-18 MED ORDER — ALRA NON-METALLIC DEODORANT (RAD-ONC)
1.0000 "application " | Freq: Once | TOPICAL | Status: AC
  Administered 2017-10-18: 1 via TOPICAL

## 2017-10-19 ENCOUNTER — Ambulatory Visit
Admission: RE | Admit: 2017-10-19 | Discharge: 2017-10-19 | Disposition: A | Discharge status: Home / Self Care | Payer: Medicare Other | Source: Ambulatory Visit | Attending: Radiation Oncology | Admitting: Radiation Oncology

## 2017-10-19 DIAGNOSIS — C50411 Malignant neoplasm of upper-outer quadrant of right female breast: Secondary | ICD-10-CM | POA: Diagnosis not present

## 2017-10-22 ENCOUNTER — Ambulatory Visit
Admission: RE | Admit: 2017-10-22 | Discharge: 2017-10-22 | Disposition: A | Discharge status: Home / Self Care | Payer: Medicare Other | Source: Ambulatory Visit | Attending: Radiation Oncology | Admitting: Radiation Oncology

## 2017-10-22 DIAGNOSIS — C50411 Malignant neoplasm of upper-outer quadrant of right female breast: Secondary | ICD-10-CM | POA: Diagnosis not present

## 2017-10-23 ENCOUNTER — Ambulatory Visit
Admission: RE | Admit: 2017-10-23 | Discharge: 2017-10-23 | Disposition: A | Discharge status: Home / Self Care | Payer: Medicare Other | Source: Ambulatory Visit | Attending: Radiation Oncology | Admitting: Radiation Oncology

## 2017-10-23 DIAGNOSIS — C50411 Malignant neoplasm of upper-outer quadrant of right female breast: Secondary | ICD-10-CM | POA: Diagnosis not present

## 2017-10-24 ENCOUNTER — Ambulatory Visit
Admission: RE | Admit: 2017-10-24 | Discharge: 2017-10-24 | Disposition: A | Discharge status: Home / Self Care | Payer: Medicare Other | Source: Ambulatory Visit | Attending: Radiation Oncology | Admitting: Radiation Oncology

## 2017-10-24 DIAGNOSIS — C50411 Malignant neoplasm of upper-outer quadrant of right female breast: Secondary | ICD-10-CM | POA: Diagnosis not present

## 2017-10-25 ENCOUNTER — Ambulatory Visit
Admission: RE | Admit: 2017-10-25 | Discharge: 2017-10-25 | Disposition: A | Discharge status: Home / Self Care | Payer: Medicare Other | Source: Ambulatory Visit | Attending: Radiation Oncology | Admitting: Radiation Oncology

## 2017-10-25 DIAGNOSIS — C50411 Malignant neoplasm of upper-outer quadrant of right female breast: Secondary | ICD-10-CM | POA: Diagnosis not present

## 2017-10-26 ENCOUNTER — Ambulatory Visit
Admission: RE | Admit: 2017-10-26 | Discharge: 2017-10-26 | Disposition: A | Discharge status: Home / Self Care | Payer: Medicare Other | Source: Ambulatory Visit | Attending: Radiation Oncology | Admitting: Radiation Oncology

## 2017-10-26 DIAGNOSIS — C50411 Malignant neoplasm of upper-outer quadrant of right female breast: Secondary | ICD-10-CM | POA: Diagnosis not present

## 2017-10-29 ENCOUNTER — Ambulatory Visit
Admission: RE | Admit: 2017-10-29 | Discharge: 2017-10-29 | Disposition: A | Discharge status: Home / Self Care | Payer: Medicare Other | Source: Ambulatory Visit | Attending: Radiation Oncology | Admitting: Radiation Oncology

## 2017-10-29 DIAGNOSIS — C50411 Malignant neoplasm of upper-outer quadrant of right female breast: Secondary | ICD-10-CM | POA: Diagnosis not present

## 2017-10-30 ENCOUNTER — Other Ambulatory Visit: Payer: Self-pay | Admitting: Oncology

## 2017-10-30 ENCOUNTER — Inpatient Hospital Stay: Payer: Medicare Other | Attending: Oncology

## 2017-10-30 ENCOUNTER — Ambulatory Visit
Admission: RE | Admit: 2017-10-30 | Discharge: 2017-10-30 | Disposition: A | Discharge status: Home / Self Care | Payer: Medicare Other | Source: Ambulatory Visit | Attending: Radiation Oncology | Admitting: Radiation Oncology

## 2017-10-30 ENCOUNTER — Inpatient Hospital Stay: Payer: Medicare Other

## 2017-10-30 VITALS — BP 157/76 | HR 70 | Temp 98.7°F | Resp 17

## 2017-10-30 DIAGNOSIS — Z5112 Encounter for antineoplastic immunotherapy: Secondary | ICD-10-CM | POA: Insufficient documentation

## 2017-10-30 DIAGNOSIS — C50411 Malignant neoplasm of upper-outer quadrant of right female breast: Secondary | ICD-10-CM | POA: Insufficient documentation

## 2017-10-30 DIAGNOSIS — Z95828 Presence of other vascular implants and grafts: Secondary | ICD-10-CM

## 2017-10-30 DIAGNOSIS — Z171 Estrogen receptor negative status [ER-]: Secondary | ICD-10-CM

## 2017-10-30 LAB — COMPREHENSIVE METABOLIC PANEL
ALT: 18 U/L (ref 0–55)
AST: 22 U/L (ref 5–34)
Albumin: 4.2 g/dL (ref 3.5–5.0)
Alkaline Phosphatase: 71 U/L (ref 40–150)
Anion gap: 7 (ref 3–11)
BUN: 17 mg/dL (ref 7–26)
CO2: 26 mmol/L (ref 22–29)
Calcium: 9.8 mg/dL (ref 8.4–10.4)
Chloride: 107 mmol/L (ref 98–109)
Creatinine, Ser: 0.9 mg/dL (ref 0.60–1.10)
GFR calc Af Amer: >60 mL/min (ref >60)
GFR calc non Af Amer: >60 mL/min (ref >60)
Glucose, Bld: 119 mg/dL (ref 70–140)
Potassium: 4.4 mmol/L (ref 3.5–5.1)
Sodium: 140 mmol/L (ref 136–145)
Total Bilirubin: 0.3 mg/dL (ref 0.2–1.2)
Total Protein: 7.5 g/dL (ref 6.4–8.3)

## 2017-10-30 LAB — CBC WITH DIFFERENTIAL/PLATELET
Basophils Absolute: 0 10*3/uL (ref 0.0–0.1)
Basophils Relative: 1 %
Eosinophils Absolute: 0.1 10*3/uL (ref 0.0–0.5)
Eosinophils Relative: 4 %
HCT: 32.5 % — ABNORMAL LOW (ref 34.8–46.6)
Hemoglobin: 10.9 g/dL — ABNORMAL LOW (ref 11.6–15.9)
Lymphocytes Relative: 27 %
Lymphs Abs: 1.1 10*3/uL (ref 0.9–3.3)
MCH: 29.2 pg (ref 25.1–34.0)
MCHC: 33.4 g/dL (ref 31.5–36.0)
MCV: 87.4 fL (ref 79.5–101.0)
Monocytes Absolute: 0.4 10*3/uL (ref 0.1–0.9)
Monocytes Relative: 11 %
Neutro Abs: 2.3 10*3/uL (ref 1.5–6.5)
Neutrophils Relative %: 57 %
Platelets: 267 10*3/uL (ref 145–400)
RBC: 3.72 MIL/uL (ref 3.70–5.45)
RDW: 15.3 % — ABNORMAL HIGH (ref 11.2–14.5)
WBC: 4 10*3/uL (ref 3.9–10.3)

## 2017-10-30 MED ORDER — TRASTUZUMAB CHEMO 150 MG IV SOLR
450.0000 mg | Freq: Once | INTRAVENOUS | Status: AC
  Administered 2017-10-30: 450 mg via INTRAVENOUS
  Filled 2017-10-30: qty 21.43

## 2017-10-30 MED ORDER — ACETAMINOPHEN 325 MG PO TABS
650.0000 mg | ORAL_TABLET | Freq: Once | ORAL | Status: AC
  Administered 2017-10-30: 650 mg via ORAL

## 2017-10-30 MED ORDER — ACETAMINOPHEN 325 MG PO TABS
ORAL_TABLET | ORAL | Status: AC
  Filled 2017-10-30: qty 2

## 2017-10-30 MED ORDER — SODIUM CHLORIDE 0.9% FLUSH
10.0000 mL | INTRAVENOUS | Status: DC | PRN
  Administered 2017-10-30: 10 mL
  Filled 2017-10-30: qty 10

## 2017-10-30 MED ORDER — HEPARIN SOD (PORK) LOCK FLUSH 100 UNIT/ML IV SOLN
500.0000 [IU] | Freq: Once | INTRAVENOUS | Status: AC | PRN
  Administered 2017-10-30: 500 [IU]
  Filled 2017-10-30: qty 5

## 2017-10-30 MED ORDER — DIPHENHYDRAMINE HCL 25 MG PO CAPS
50.0000 mg | ORAL_CAPSULE | Freq: Once | ORAL | Status: AC
  Administered 2017-10-30: 50 mg via ORAL

## 2017-10-30 MED ORDER — SODIUM CHLORIDE 0.9 % IV SOLN
Freq: Once | INTRAVENOUS | Status: AC
  Administered 2017-10-30: 12:00:00 via INTRAVENOUS

## 2017-10-30 MED ORDER — DIPHENHYDRAMINE HCL 25 MG PO CAPS
ORAL_CAPSULE | ORAL | Status: AC
  Filled 2017-10-30: qty 2

## 2017-10-30 NOTE — Patient Instructions (Signed)
[facility] Discharge Instructions for Patients Receiving Chemotherapy Today you received the following chemotherapy agents:  Herceptin To help prevent nausea and vomiting after your treatment, we encourage you to take your nausea medication as prescribed.   If you develop nausea and vomiting that is not controlled by your nausea medication, call the clinic.   BELOW ARE SYMPTOMS THAT SHOULD BE REPORTED IMMEDIATELY:  *FEVER GREATER THAN 100.5 F  *CHILLS WITH OR WITHOUT FEVER  NAUSEA AND VOMITING THAT IS NOT CONTROLLED WITH YOUR NAUSEA MEDICATION  *UNUSUAL SHORTNESS OF BREATH  *UNUSUAL BRUISING OR BLEEDING  TENDERNESS IN MOUTH AND THROAT WITH OR WITHOUT PRESENCE OF ULCERS  *URINARY PROBLEMS  *BOWEL PROBLEMS  UNUSUAL RASH Items with * indicate a potential emergency and should be followed up as soon as possible.  Feel free to call the clinic should you have any questions or concerns. The clinic phone number is [phone].  Please show the CHEMO ALERT CARD at check-in to the Emergency Department and triage nurse.   

## 2017-10-31 ENCOUNTER — Ambulatory Visit
Admission: RE | Admit: 2017-10-31 | Discharge: 2017-10-31 | Disposition: A | Discharge status: Home / Self Care | Payer: Medicare Other | Source: Ambulatory Visit | Attending: Radiation Oncology | Admitting: Radiation Oncology

## 2017-10-31 DIAGNOSIS — C50411 Malignant neoplasm of upper-outer quadrant of right female breast: Secondary | ICD-10-CM | POA: Diagnosis not present

## 2017-11-01 ENCOUNTER — Ambulatory Visit (HOSPITAL_COMMUNITY)
Admission: RE | Admit: 2017-11-01 | Discharge: 2017-11-01 | Disposition: A | Discharge status: Home / Self Care | Payer: Medicare Other | Source: Ambulatory Visit | Attending: Internal Medicine | Admitting: Internal Medicine

## 2017-11-01 ENCOUNTER — Ambulatory Visit (HOSPITAL_BASED_OUTPATIENT_CLINIC_OR_DEPARTMENT_OTHER)
Admission: RE | Admit: 2017-11-01 | Discharge: 2017-11-01 | Disposition: A | Discharge status: Home / Self Care | Payer: Medicare Other | Source: Ambulatory Visit | Attending: Internal Medicine | Admitting: Internal Medicine

## 2017-11-01 ENCOUNTER — Ambulatory Visit
Admission: RE | Admit: 2017-11-01 | Discharge: 2017-11-01 | Disposition: A | Discharge status: Home / Self Care | Payer: Medicare Other | Source: Ambulatory Visit | Attending: Radiation Oncology | Admitting: Radiation Oncology

## 2017-11-01 ENCOUNTER — Encounter (HOSPITAL_COMMUNITY): Payer: Self-pay | Admitting: Internal Medicine

## 2017-11-01 VITALS — BP 162/88 | HR 67 | Wt 152.4 lb

## 2017-11-01 DIAGNOSIS — C50411 Malignant neoplasm of upper-outer quadrant of right female breast: Secondary | ICD-10-CM

## 2017-11-01 DIAGNOSIS — I340 Nonrheumatic mitral (valve) insufficiency: Secondary | ICD-10-CM | POA: Diagnosis not present

## 2017-11-01 DIAGNOSIS — Z888 Allergy status to other drugs, medicaments and biological substances status: Secondary | ICD-10-CM | POA: Insufficient documentation

## 2017-11-01 DIAGNOSIS — Z886 Allergy status to analgesic agent status: Secondary | ICD-10-CM | POA: Insufficient documentation

## 2017-11-01 DIAGNOSIS — Z803 Family history of malignant neoplasm of breast: Secondary | ICD-10-CM | POA: Diagnosis not present

## 2017-11-01 DIAGNOSIS — I10 Essential (primary) hypertension: Secondary | ICD-10-CM

## 2017-11-01 DIAGNOSIS — Z7982 Long term (current) use of aspirin: Secondary | ICD-10-CM | POA: Diagnosis not present

## 2017-11-01 DIAGNOSIS — I119 Hypertensive heart disease without heart failure: Secondary | ICD-10-CM | POA: Insufficient documentation

## 2017-11-01 DIAGNOSIS — Z882 Allergy status to sulfonamides status: Secondary | ICD-10-CM | POA: Diagnosis not present

## 2017-11-01 DIAGNOSIS — Z8041 Family history of malignant neoplasm of ovary: Secondary | ICD-10-CM | POA: Insufficient documentation

## 2017-11-01 DIAGNOSIS — C50011 Malignant neoplasm of nipple and areola, right female breast: Secondary | ICD-10-CM

## 2017-11-01 DIAGNOSIS — G459 Transient cerebral ischemic attack, unspecified: Secondary | ICD-10-CM | POA: Insufficient documentation

## 2017-11-01 DIAGNOSIS — Z79899 Other long term (current) drug therapy: Secondary | ICD-10-CM | POA: Insufficient documentation

## 2017-11-01 MED ORDER — HYDROCHLOROTHIAZIDE 12.5 MG PO CAPS: 12.5000 mg | ORAL_CAPSULE | Freq: Every day | ORAL | 3 refills | Status: DC

## 2017-11-01 NOTE — Progress Notes (Signed)
  Echocardiogram 2D Echocardiogram has been performed.  Latika Kronick T Danaria Larsen 11/01/2017, 11:54 AM

## 2017-11-01 NOTE — Progress Notes (Signed)
CARDIO-ONCOLOGY CLINIC CONSULT NOTE  Referring Physician: Magrinat    HPI:  [name] is 67 y.o. female (former April [name]) with right breast cancer referred by Dr. Jana [name] for enrollment into the Cardio-Oncology program for herceptin surveillance. .  Denies h/o of major cardiac problems. Was having palpitations in distant past and thought she had MVP. Followed with Dr. Melvern [name] who felt palpitations were stress-related. Echo ok.   In 1/19 diagnosed with clinical T1b N0, stage IA invasive ductal carcinoma, grade 3, estrogen and progesterone receptor negative, but HER-2 amplified, with an MIB-1 of 30% right breast CA.   Breast cancer history as below   (1) status post right lumpectomy and sentinel lymph node sampling 07/17/2017 for a pT1c pN0, stage IA invasive ductal carcinoma, grade 2, with negative margins.  A total of 5 lymph nodes were removed  (2) adjuvant chemotherapy will consist of paclitaxel weekly x12 together with trastuzumab every 21 days starting 08/07/2017             (a) paclitaxel stopped after 8 doses because of neuropathy (last dose 04/09/201  (3) trastuzumab will be continued to total 1 year             (a) echo 08/10/2017 showed an ejection fraction in the 65-70% range  (4) XRT started in 10/17/17  Admitted to April [name] in 2/19 with ? TIA. Was slurring her words. SBP > 200 at the time (says this was new diagnosis of HTN). Seen by Neurology and w/u negative including MRI. Was started on lisinopril and has been titrated up. SBP 135-150 at home. No further neuro symptom. Denies CP or SOB. No edema, orthopnea or PND. Has one week left of XRT.  Says she has read up on lisinopril and feels it might not work as well for African Americans to prevent stroke.    Echo today 60-65% GLS -20.9% Bubble study negative Personally reviewed    Review of Systems: [y] = yes, '[ ]'  = no   General: Weight gain '[ ]' ; Weight loss '[ ]' ; Anorexia '[ ]' ; Fatigue '[ ]' ; Fever '[ ]' ;  Chills '[ ]' ; Weakness '[ ]'   Cardiac: Chest pain/pressure '[ ]' ; Resting SOB '[ ]' ; Exertional SOB '[ ]' ; Orthopnea '[ ]' ; Pedal Edema '[ ]' ; Palpitations '[ ]' ; Syncope '[ ]' ; Presyncope '[ ]' ; Paroxysmal nocturnal dyspnea'[ ]'   Pulmonary: Cough '[ ]' ; Wheezing'[ ]' ; Hemoptysis'[ ]' ; Sputum '[ ]' ; Snoring '[ ]'   GI: Vomiting'[ ]' ; Dysphagia'[ ]' ; Melena'[ ]' ; Hematochezia '[ ]' ; Heartburn'[ ]' ; Abdominal pain '[ ]' ; Constipation '[ ]' ; Diarrhea '[ ]' ; BRBPR '[ ]'   GU: Hematuria'[ ]' ; Dysuria '[ ]' ; Nocturia'[ ]'   Vascular: Pain in legs with walking '[ ]' ; Pain in feet with lying flat '[ ]' ; Non-healing sores '[ ]' ; Stroke '[ ]' ; TIA '[ ]' ; Slurred speech '[ ]' ;  Neuro: Headaches'[ ]' ; Vertigo'[ ]' ; Seizures'[ ]' ; Paresthesias'[ ]' ;Blurred vision '[ ]' ; Diplopia '[ ]' ; Vision changes '[ ]'   Ortho/Skin: Arthritis [ y]; Joint pain [ y]; Muscle pain '[ ]' ; Joint swelling '[ ]' ; Back Pain '[ ]' ; Rash '[ ]'   Psych: Depression'[ ]' ; Anxiety'[ ]'   Heme: Bleeding problems '[ ]' ; Clotting disorders '[ ]' ; Anemia '[ ]'   Endocrine: Diabetes '[ ]' ; Thyroid dysfunction'[ ]'    Past Medical History:  Diagnosis Date  . Breast cancer (April [name]) 05/2017   right breast  . Eczema   . Family history of breast cancer   . Family history of ovarian cancer   . Hypertension    toxemia  during pregnancy, no meds now    Current Outpatient Medications  Medication Sig Dispense Refill  . aspirin 81 MG chewable tablet Chew 81 mg by mouth daily.    Marland Kitchen CALCIUM-VITAMIN D PO Take 1 tablet by mouth daily.    . Coenzyme Q10 (COQ10 PO) Take by mouth daily.    . ergocalciferol (VITAMIN D2) 50000 units capsule Take 50,000 Units by mouth once a week.    Marland Kitchen lisinopril (PRINIVIL,ZESTRIL) 20 MG tablet Take 20 mg by mouth daily.    . pravastatin (PRAVACHOL) 80 MG tablet Take 1 tablet (80 mg total) by mouth daily at 6 PM. 60 tablet 0  . vitamin B-12 (CYANOCOBALAMIN) 1000 MCG tablet Take 1,000 mcg by mouth daily.     No current facility-administered medications for this encounter.     Allergies  Allergen Reactions  . Tape Itching and  Rash  . Nsaids   . Antihistamines, Diphenhydramine-Type Other (See Comments)    Pt stated she gets lump in throat and stomach when taking histamines.   . Sulfa Antibiotics Rash      Social History   Socioeconomic History  . Marital status: Married    Spouse name: Not on file  . Number of children: Not on file  . Years of education: Not on file  . Highest education level: Not on file  Occupational History  . Not on file  Social Needs  . Financial resource strain: Not on file  . Food insecurity:    Worry: Not on file    Inability: Not on file  . Transportation needs:    Medical: Not on file    Non-medical: Not on file  Tobacco Use  . Smoking status: Never Smoker  . Smokeless tobacco: Never Used  Substance and Sexual Activity  . Alcohol use: Yes    Comment: social  . Drug use: No  . Sexual activity: Not Currently  Lifestyle  . Physical activity:    Days per week: Not on file    Minutes per session: Not on file  . Stress: Not on file  Relationships  . Social connections:    Talks on phone: Not on file    Gets together: Not on file    Attends religious service: Not on file    Active member of club or organization: Not on file    Attends meetings of clubs or organizations: Not on file    Relationship status: Not on file  . Intimate partner violence:    Fear of current or ex partner: Not on file    Emotionally abused: Not on file    Physically abused: Not on file    Forced sexual activity: Not on file  Other Topics Concern  . Not on file  Social History Narrative  . Not on file      Family History  Problem Relation Age of Onset  . Breast cancer Mother 27       again at 75 in other breast   . Heart attack Father 19  . Breast cancer Sister 79  . Breast cancer Maternal Grandmother 65       spread to lungs, died at 80  . Ovarian cancer Cousin 64  . Prostate cancer Cousin     Vitals:   11/01/17 1154  BP: (!) 162/88  Pulse: 67  SpO2: 98%  Weight: 152 lb  6.4 oz (69.1 kg)    PHYSICAL EXAM: General:  Well appearing. No respiratory difficulty HEENT: normal Neck: supple. no  JVD. Carotids 2+ bilat; no bruits. No lymphadenopathy or thryomegaly appreciated. Right port-a-cath  Cor: PMI nondisplaced. Regular rate & rhythm. No rubs, gallops or murmurs. Lungs: clear Abdomen: soft, nontender, nondistended. No hepatosplenomegaly. No bruits or masses. Good bowel sounds. Extremities: no cyanosis, clubbing, rash, edema Neuro: alert & oriented x 3, cranial nerves grossly intact. moves all 4 extremities w/o difficulty. Affect pleasant.   ASSESSMENT & PLAN: 1. Right Breast Cancer - diagnosed 1/19 - clinical T1b N0, stage IA invasive ductal carcinoma, grade 3, estrogen and progesterone receptor negative, but HER-2 amplified, with an MIB-1 of 30%  - Explained incidence of Herceptin cardiotoxicity and role of Cardio-oncology clinic at length. Echo images reviewed personally. All parameters stable. Reviewed signs and symptoms of HF to look for. Continue Herceptin. Follow-up with echo in 3 months.  2. HTN  - BP improved but still elevated. Long talk about pros/cons of various anti-HTN agents - Will add HCTZ 12.5 - Continue lisinopril 20 for now  Glori Bickers, MD  12:24 PM

## 2017-11-01 NOTE — Patient Instructions (Signed)
Start HCTZ 12.5 mg daily  Your physician recommends that you schedule a follow-up appointment in: 3 months with echocardiogram

## 2017-11-02 ENCOUNTER — Ambulatory Visit
Admission: RE | Admit: 2017-11-02 | Discharge: 2017-11-02 | Disposition: A | Discharge status: Home / Self Care | Payer: Medicare Other | Source: Ambulatory Visit | Attending: Radiation Oncology | Admitting: Radiation Oncology

## 2017-11-02 DIAGNOSIS — C50411 Malignant neoplasm of upper-outer quadrant of right female breast: Secondary | ICD-10-CM | POA: Diagnosis not present

## 2017-11-05 ENCOUNTER — Ambulatory Visit
Admission: RE | Admit: 2017-11-05 | Discharge: 2017-11-05 | Disposition: A | Discharge status: Home / Self Care | Payer: Medicare Other | Source: Ambulatory Visit | Attending: Radiation Oncology | Admitting: Radiation Oncology

## 2017-11-05 DIAGNOSIS — C50411 Malignant neoplasm of upper-outer quadrant of right female breast: Secondary | ICD-10-CM | POA: Diagnosis not present

## 2017-11-06 ENCOUNTER — Ambulatory Visit
Admission: RE | Admit: 2017-11-06 | Discharge: 2017-11-06 | Disposition: A | Discharge status: Home / Self Care | Payer: Medicare Other | Source: Ambulatory Visit | Attending: Radiation Oncology | Admitting: Radiation Oncology

## 2017-11-06 DIAGNOSIS — C50411 Malignant neoplasm of upper-outer quadrant of right female breast: Secondary | ICD-10-CM | POA: Diagnosis not present

## 2017-11-07 ENCOUNTER — Ambulatory Visit
Admission: RE | Admit: 2017-11-07 | Discharge: 2017-11-07 | Disposition: A | Discharge status: Home / Self Care | Payer: Medicare Other | Source: Ambulatory Visit | Attending: Radiation Oncology | Admitting: Radiation Oncology

## 2017-11-07 DIAGNOSIS — C50411 Malignant neoplasm of upper-outer quadrant of right female breast: Secondary | ICD-10-CM | POA: Diagnosis not present

## 2017-11-08 ENCOUNTER — Ambulatory Visit
Admission: RE | Admit: 2017-11-08 | Discharge: 2017-11-08 | Disposition: A | Discharge status: Home / Self Care | Payer: Medicare Other | Source: Ambulatory Visit | Attending: Radiation Oncology | Admitting: Radiation Oncology

## 2017-11-08 DIAGNOSIS — C50411 Malignant neoplasm of upper-outer quadrant of right female breast: Secondary | ICD-10-CM | POA: Diagnosis not present

## 2017-11-09 ENCOUNTER — Ambulatory Visit
Admission: RE | Admit: 2017-11-09 | Discharge: 2017-11-09 | Disposition: A | Discharge status: Home / Self Care | Payer: Medicare Other | Source: Ambulatory Visit | Attending: Radiation Oncology | Admitting: Radiation Oncology

## 2017-11-09 DIAGNOSIS — C50411 Malignant neoplasm of upper-outer quadrant of right female breast: Secondary | ICD-10-CM | POA: Diagnosis not present

## 2017-11-13 ENCOUNTER — Ambulatory Visit
Admission: RE | Admit: 2017-11-13 | Discharge: 2017-11-13 | Disposition: A | Discharge status: Home / Self Care | Payer: Medicare Other | Source: Ambulatory Visit | Attending: Radiation Oncology | Admitting: Radiation Oncology

## 2017-11-13 DIAGNOSIS — C50411 Malignant neoplasm of upper-outer quadrant of right female breast: Secondary | ICD-10-CM | POA: Diagnosis not present

## 2017-11-13 DIAGNOSIS — Z171 Estrogen receptor negative status [ER-]: Principal | ICD-10-CM

## 2017-11-13 MED ORDER — RADIAPLEXRX EX GEL
Freq: Once | CUTANEOUS | Status: AC
  Administered 2017-11-13: 11:00:00 via TOPICAL

## 2017-11-14 ENCOUNTER — Ambulatory Visit
Admission: RE | Admit: 2017-11-14 | Discharge: 2017-11-14 | Disposition: A | Discharge status: Home / Self Care | Payer: Medicare Other | Source: Ambulatory Visit | Attending: Radiation Oncology | Admitting: Radiation Oncology

## 2017-11-14 ENCOUNTER — Encounter: Payer: Self-pay | Admitting: Radiation Oncology

## 2017-11-14 DIAGNOSIS — C50411 Malignant neoplasm of upper-outer quadrant of right female breast: Secondary | ICD-10-CM | POA: Diagnosis not present

## 2017-11-16 NOTE — Progress Notes (Signed)
  Radiation Oncology         [phone] ________________________________  Name: April [name] MRN: [id number]  Date: 11/14/2017  DOB: [birth year]  End of Treatment Note  Diagnosis:   StageIApT1cN0 RightBreast UOQ Invasive Ductal Carcinomawith DCIS, ER(-)/ PR(-)/ Her2(+), Grade3.      Indication for treatment:  Curative       Radiation treatment dates:   10/17/17 - 11/14/17  Site/dose:   1) 40.05 Gy directed to the Right Breast in 15 fractions, 2) followed by a boost of 10 Gy given in 5 fractions.  Beams/energy:   1) Photon // 3D// 6X 2) electrons / 15 MeV  Narrative: The patient tolerated radiation treatment relatively well.   She denies having pain, but reports having fatigue. She has hyperpigmentation present to her Right Breast. She reported that she has some swelling to her right breast and areola. She has been using Radiaplex.  Plan: The patient has completed radiation treatment. The patient will return to radiation oncology clinic for routine followup in one month. I advised them to call or return sooner if they have any questions or concerns related to their recovery or treatment.  -----------------------------------  Eppie Gibson, MD   This document serves as a record of services personally performed by Eppie Gibson MD. It was created on her behalf by Delton Coombes, a trained medical scribe. The creation of this record is based on the scribe's personal observations and the provider's statements to them.

## 2017-11-20 ENCOUNTER — Encounter: Payer: Self-pay | Admitting: Adult Health

## 2017-11-20 ENCOUNTER — Inpatient Hospital Stay: Payer: Medicare Other | Attending: Oncology

## 2017-11-20 ENCOUNTER — Inpatient Hospital Stay: Payer: Medicare Other

## 2017-11-20 ENCOUNTER — Inpatient Hospital Stay (HOSPITAL_BASED_OUTPATIENT_CLINIC_OR_DEPARTMENT_OTHER): Payer: Medicare Other | Admitting: Adult Health

## 2017-11-20 VITALS — BP 143/66 | HR 74 | Temp 98.4°F | Resp 18 | Ht 63.0 in | Wt 150.7 lb

## 2017-11-20 DIAGNOSIS — C50411 Malignant neoplasm of upper-outer quadrant of right female breast: Secondary | ICD-10-CM

## 2017-11-20 DIAGNOSIS — G620 Drug-induced polyneuropathy: Secondary | ICD-10-CM | POA: Diagnosis not present

## 2017-11-20 DIAGNOSIS — Z923 Personal history of irradiation: Secondary | ICD-10-CM | POA: Insufficient documentation

## 2017-11-20 DIAGNOSIS — Z171 Estrogen receptor negative status [ER-]: Secondary | ICD-10-CM

## 2017-11-20 DIAGNOSIS — Z5112 Encounter for antineoplastic immunotherapy: Secondary | ICD-10-CM | POA: Insufficient documentation

## 2017-11-20 DIAGNOSIS — R5383 Other fatigue: Secondary | ICD-10-CM | POA: Insufficient documentation

## 2017-11-20 DIAGNOSIS — Z95828 Presence of other vascular implants and grafts: Secondary | ICD-10-CM

## 2017-11-20 LAB — COMPREHENSIVE METABOLIC PANEL
ALT: 20 U/L (ref 0–55)
AST: 24 U/L (ref 5–34)
Albumin: 4.2 g/dL (ref 3.5–5.0)
Alkaline Phosphatase: 77 U/L (ref 40–150)
Anion gap: 8 (ref 3–11)
BUN: 15 mg/dL (ref 7–26)
CO2: 28 mmol/L (ref 22–29)
Calcium: 9.7 mg/dL (ref 8.4–10.4)
Chloride: 104 mmol/L (ref 98–109)
Creatinine, Ser: 1.13 mg/dL — ABNORMAL HIGH (ref 0.60–1.10)
GFR calc Af Amer: 57 mL/min — ABNORMAL LOW (ref >60)
GFR calc non Af Amer: 49 mL/min — ABNORMAL LOW (ref >60)
Glucose, Bld: 100 mg/dL (ref 70–140)
Potassium: 4.3 mmol/L (ref 3.5–5.1)
Sodium: 140 mmol/L (ref 136–145)
Total Bilirubin: <0.2 mg/dL — ABNORMAL LOW (ref 0.2–1.2)
Total Protein: 7.4 g/dL (ref 6.4–8.3)

## 2017-11-20 LAB — CBC WITH DIFFERENTIAL/PLATELET
Basophils Absolute: 0 10*3/uL (ref 0.0–0.1)
Basophils Relative: 1 %
Eosinophils Absolute: 0.1 10*3/uL (ref 0.0–0.5)
Eosinophils Relative: 3 %
HCT: 32.3 % — ABNORMAL LOW (ref 34.8–46.6)
Hemoglobin: 10.6 g/dL — ABNORMAL LOW (ref 11.6–15.9)
Lymphocytes Relative: 26 %
Lymphs Abs: 1 10*3/uL (ref 0.9–3.3)
MCH: 28.7 pg (ref 25.1–34.0)
MCHC: 32.9 g/dL (ref 31.5–36.0)
MCV: 87.3 fL (ref 79.5–101.0)
Monocytes Absolute: 0.5 10*3/uL (ref 0.1–0.9)
Monocytes Relative: 12 %
Neutro Abs: 2.3 10*3/uL (ref 1.5–6.5)
Neutrophils Relative %: 58 %
Platelets: 238 10*3/uL (ref 145–400)
RBC: 3.7 MIL/uL (ref 3.70–5.45)
RDW: 14.9 % — ABNORMAL HIGH (ref 11.2–14.5)
WBC: 3.9 10*3/uL (ref 3.9–10.3)

## 2017-11-20 MED ORDER — ACETAMINOPHEN 325 MG PO TABS
650.0000 mg | ORAL_TABLET | Freq: Once | ORAL | Status: AC
  Administered 2017-11-20: 650 mg via ORAL

## 2017-11-20 MED ORDER — SODIUM CHLORIDE 0.9 % IV SOLN
Freq: Once | INTRAVENOUS | Status: AC
  Administered 2017-11-20: 14:00:00 via INTRAVENOUS

## 2017-11-20 MED ORDER — HEPARIN SOD (PORK) LOCK FLUSH 100 UNIT/ML IV SOLN
500.0000 [IU] | Freq: Once | INTRAVENOUS | Status: AC | PRN
  Administered 2017-11-20: 500 [IU]
  Filled 2017-11-20: qty 5

## 2017-11-20 MED ORDER — DIPHENHYDRAMINE HCL 25 MG PO CAPS
ORAL_CAPSULE | ORAL | Status: AC
  Filled 2017-11-20: qty 2

## 2017-11-20 MED ORDER — DIPHENHYDRAMINE HCL 25 MG PO CAPS
50.0000 mg | ORAL_CAPSULE | Freq: Once | ORAL | Status: AC
  Administered 2017-11-20: 50 mg via ORAL

## 2017-11-20 MED ORDER — SODIUM CHLORIDE 0.9% FLUSH
10.0000 mL | INTRAVENOUS | Status: DC | PRN
  Administered 2017-11-20: 10 mL
  Filled 2017-11-20: qty 10

## 2017-11-20 MED ORDER — ACETAMINOPHEN 325 MG PO TABS
ORAL_TABLET | ORAL | Status: AC
  Filled 2017-11-20: qty 2

## 2017-11-20 MED ORDER — TRASTUZUMAB CHEMO 150 MG IV SOLR
450.0000 mg | Freq: Once | INTRAVENOUS | Status: AC
  Administered 2017-11-20: 450 mg via INTRAVENOUS
  Filled 2017-11-20: qty 21.43

## 2017-11-20 MED ORDER — LIDOCAINE-PRILOCAINE 2.5-2.5 % EX CREA: 1.0000 "application " | TOPICAL_CREAM | CUTANEOUS | 0 refills | Status: DC | PRN

## 2017-11-20 MED ORDER — GABAPENTIN 100 MG PO CAPS: 100.0000 mg | ORAL_CAPSULE | Freq: Three times a day (TID) | ORAL | 0 refills | Status: DC

## 2017-11-20 NOTE — Progress Notes (Signed)
[facility]  [phone] [phone]     ID: April [name] DOB: 1950/12/04  MR#: [id number]  OIN#:[id number]  Patient Care Team: Kelton Pillar, MD as PCP - General (Family Medicine) Magrinat, Virgie Dad, MD as Consulting Physician (Oncology) Rolm Bookbinder, MD as Consulting Physician (General Surgery) Eppie Gibson, MD as Attending Physician (Radiation Oncology) Newt Minion, MD as Consulting Physician (Orthopedic Surgery) Regal, Tamala Fothergill, DPM as Consulting Physician (Podiatry) Neldon Mc, Donnamarie Poag, MD as Consulting Physician (Allergy and Immunology) OTHER MD:  CHIEF COMPLAINT: Estrogen receptor negative breast cancer  CURRENT TREATMENT: trastuzumab   HISTORY OF CURRENT ILLNESS: From the original intake note:  "April [name]" had bilateral screening mammography at [facility] 06/06/2017.  This showed a possible mass in the right breast at the 11 o'clock position.  On 06/13/2017 she underwent right diagnostic mammography and ultrasonography.  Breast density was category C.  In the right breast at the 11 o'clock position there was a 1 cm area by mammography.  By ultrasound this confirmed a 1.0 cm irregular mass with lobulated margins in the upper outer quadrant of the right breast.  There was a second, 0.4 cm lobulated mass in the same quadrant.  The right axilla was sonographically benign.  On 06/20/2017 biopsy of the 2 right breast masses in question was performed.  The final pathology (SAA 19-36) found the smaller mass to be only fibrocystic change.  This is felt to be concordant.  The larger mass however was an invasive ductal carcinoma, grade 3, estrogen and progesterone receptor negative, with an MIB-1 of 30%, and HER-2 amplified, with a signals ratio of 2.24.  The number per cell was 4.60.  The patient's subsequent history is as detailed below.  INTERVAL HISTORY: April [name] returns to the office today for follow up of her estrogen receptor negative breast  cancer. Today is day 1 cycle 4 of adjuvant trastuzumab for her HER-2 positive breast cancer, with Trastuzumab given every 3 weeks.  April [name] is doing well today.  She has completed radiation on 11/14/2017.  She also underwent an echocardiogram on 11/01/2017 that was normal.  REVIEW OF SYSTEMS: April [name] is doing well today.  She is fatigued following radiation.  She isn't sleeping any more than she usually does, her feeling tired has however become more noticeable.  She continues to have neuropathy.  She wants something for it to see if it may help.  She says she is very aware of her neuropathy.  April [name] is doing well otherwise and denies headaches, vision changes, nausea, vomiting, constipation, diarrhea, chest pain, palpitations, shortness of breath.  A detailed ROS was otherwise non contributory.     PAST MEDICAL HISTORY: Past Medical History:  Diagnosis Date  . Breast cancer ([place]) 05/2017   right breast  . Eczema   . Family history of breast cancer   . Family history of ovarian cancer   . Hypertension    toxemia during pregnancy, no meds now    PAST SURGICAL HISTORY: Past Surgical History:  Procedure Laterality Date  . ABDOMINAL HYSTERECTOMY    . BREAST LUMPECTOMY WITH RADIOACTIVE SEED AND SENTINEL LYMPH NODE BIOPSY Right 07/17/2017   Procedure: BREAST LUMPECTOMY WITH RADIOACTIVE SEED AND SENTINEL LYMPH NODE BIOPSY;  Surgeon: Rolm Bookbinder, MD;  Location: [facility];  Service: General;  Laterality: Right;  . CESAREAN SECTION     x4  . DILATION AND CURETTAGE OF UTERUS    . KNEE SURGERY Right   . PORTACATH PLACEMENT N/A 07/17/2017  Procedure: INSERTION PORT-A-CATH WITH [place];  Surgeon: Rolm Bookbinder, MD;  Location: [facility];  Service: General;  Laterality: N/A;    FAMILY HISTORY Family History  Problem Relation Age of Onset  . Breast cancer Mother 96       again at 51 in other breast   . Heart attack Father 42  . Breast cancer Sister 19  .  Breast cancer Maternal Grandmother 73       spread to lungs, died at 70  . Ovarian cancer Cousin 90  . Prostate cancer Cousin   The patient's father died at age 12 from a heart attack.  The patient's mother is currently living at age 77 (as of January 2019).  The patient had 1 sister who was diagnosed with breast cancer at age 67 and died from metastatic disease at age 77.  The patient has 1 brother.  In addition the patient's mother was diagnosed with breast cancer at age 8, on the left side, and now has a right-sided breast cancer diagnosed in January 2019.  There is in addition a cousin with ovarian cancer diagnosed when she was 68 years old  GYNECOLOGIC HISTORY:  No LMP recorded. Patient has had a hysterectomy. Menarche age 70, first live birth age 77, the patient had 3 live births, 1 of whom survived only 2 days.  She underwent hysterectomy without salpingo-oophorectomy September 05, 1978.  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  She is now retired.  Her husband April [name] his Theme park manager at [place].  The patient's daughter April [name] lives in [place] where she is an Tourist information centre manager.  The patient's daughter April [name] lives in [place] working for the department of defense.  The patient has 3 grandchildren.     ADVANCED DIRECTIVES: Not in place   HEALTH MAINTENANCE: Social History   Tobacco Use  . Smoking status: Never Smoker  . Smokeless tobacco: Never Used  Substance Use Topics  . Alcohol use: Yes    Comment: social  . Drug use: No     Colonoscopy: April 2018/[facility]  PAP: Status post hysterectomy  Bone density:   Allergies  Allergen Reactions  . Tape Itching and Rash  . Nsaids   . Antihistamines, Diphenhydramine-Type Other (See Comments)    Pt stated she gets lump in throat and stomach when taking histamines.   . Sulfa  Antibiotics Rash    Current Outpatient Medications  Medication Sig Dispense Refill  . aspirin 81 MG chewable tablet Chew 81 mg by mouth daily.    Marland Kitchen CALCIUM-VITAMIN D PO Take 1 tablet by mouth daily.    . Coenzyme Q10 (COQ10 PO) Take by mouth daily.    . ergocalciferol (VITAMIN D2) 50000 units capsule Take 50,000 Units by mouth once a week.    . hydrochlorothiazide (MICROZIDE) 12.5 MG capsule Take 1 capsule (12.5 mg total) by mouth daily. 90 capsule 3  . lisinopril (PRINIVIL,ZESTRIL) 20 MG tablet Take 20 mg by mouth daily.    . pravastatin (PRAVACHOL) 80 MG tablet Take 1 tablet (80 mg total) by mouth daily at 6 PM. 60 tablet 0  . vitamin B-12 (CYANOCOBALAMIN) 1000 MCG tablet Take 1,000 mcg by mouth daily.     No current facility-administered medications for this visit.     OBJECTIVE:   Vitals:   11/20/17 1317  BP: (!) 143/66  Pulse: 74  Resp: 18  Temp: 98.4 F (36.9 C)  SpO2: 100%     Body mass index is 26.7 kg/m.   Wt Readings from Last 3 Encounters:  11/20/17 150 lb 11.2 oz (68.4 kg)  11/01/17 152 lb 6.4 oz (69.1 kg)  10/10/17 154 lb 6.4 oz (70 kg)  ECOG FS:1 - Symptomatic but completely ambulatory GENERAL: Patient is a well appearing female in no acute distress HEENT:  Sclerae anicteric.  Oropharynx clear and moist. No ulcerations or evidence of oropharyngeal candidiasis. Neck is supple.  NODES:  No cervical, supraclavicular, or axillary lymphadenopathy palpated.  BREAST EXAM:  Right breast is erythematous and slightly swollen s/p radiation, no skin peeling, inspected only LUNGS:  Clear to auscultation bilaterally.  No wheezes or rhonchi. HEART:  Regular rate and rhythm. No murmur appreciated. ABDOMEN:  Soft, nontender.  Positive, normoactive bowel sounds. No organomegaly palpated. MSK:  No focal spinal tenderness to palpation. Full range of motion bilaterally in the upper extremities. EXTREMITIES:  No peripheral edema.   SKIN:  Clear with no obvious rashes or skin  changes. No nail dyscrasia. NEURO:  Nonfocal. Well oriented.  Appropriate affect.    LAB RESULTS:  CMP     Component Value Date/Time   NA 140 10/30/2017 1048   K 4.4 10/30/2017 1048   CL 107 10/30/2017 1048   CO2 26 10/30/2017 1048   GLUCOSE 119 10/30/2017 1048   BUN 17 10/30/2017 1048   CREATININE 0.90 10/30/2017 1048   CREATININE 1.01 06/27/2017 0843   CALCIUM 9.8 10/30/2017 1048   PROT 7.5 10/30/2017 1048   ALBUMIN 4.2 10/30/2017 1048   AST 22 10/30/2017 1048   AST 16 06/27/2017 0843   ALT 18 10/30/2017 1048   ALT 14 06/27/2017 0843   ALKPHOS 71 10/30/2017 1048   BILITOT 0.3 10/30/2017 1048   BILITOT 0.2 06/27/2017 0843   GFRNONAA >60 10/30/2017 1048   GFRNONAA 56 (L) 06/27/2017 0843   GFRAA >60 10/30/2017 1048   GFRAA >60 06/27/2017 0843    No results found for: TOTALPROTELP, ALBUMINELP, A1GS, A2GS, BETS, BETA2SER, GAMS, MSPIKE, SPEI  No results found for: KPAFRELGTCHN, LAMBDASER, KAPLAMBRATIO  Lab Results  Component Value Date   WBC 3.9 11/20/2017   NEUTROABS 2.3 11/20/2017   HGB 10.6 (L) 11/20/2017   HCT 32.3 (L) 11/20/2017   MCV 87.3 11/20/2017   PLT 238 11/20/2017    _0 @  No results found for: LABCA2  No components found for: YPPJKD326  No results for input(s): INR in the last 168 hours.  No results found for: LABCA2  No results found for: ZTI458  No results found for: KDX833  No results found for: ASN053  No results found for: CA2729  No components found for: HGQUANT  No results found for: CEA1 / No results found for: CEA1   No results found for: AFPTUMOR  No results found for: CHROMOGRNA  No results found for: PSA1  Appointment on 11/20/2017  Component Date Value Ref Range Status  . WBC 11/20/2017 3.9  3.9 - 10.3 K/uL Final  . RBC 11/20/2017 3.70  3.70 - 5.45 MIL/uL Final  . Hemoglobin 11/20/2017 10.6* 11.6 - 15.9 g/dL Final  . HCT 11/20/2017 32.3* 34.8 - 46.6 % Final  . MCV 11/20/2017 87.3  79.5 - 101.0 fL Final    . MCH 11/20/2017 28.7  25.1 - 34.0 pg Final  . MCHC 11/20/2017 32.9  31.5 - 36.0 g/dL Final  . RDW 11/20/2017 14.9* 11.2 - 14.5 %  Final  . Platelets 11/20/2017 238  145 - 400 K/uL Final  . Neutrophils Relative % 11/20/2017 58  % Final  . Neutro Abs 11/20/2017 2.3  1.5 - 6.5 K/uL Final  . Lymphocytes Relative 11/20/2017 26  % Final  . Lymphs Abs 11/20/2017 1.0  0.9 - 3.3 K/uL Final  . Monocytes Relative 11/20/2017 12  % Final  . Monocytes Absolute 11/20/2017 0.5  0.1 - 0.9 K/uL Final  . Eosinophils Relative 11/20/2017 3  % Final  . Eosinophils Absolute 11/20/2017 0.1  0.0 - 0.5 K/uL Final  . Basophils Relative 11/20/2017 1  % Final  . Basophils Absolute 11/20/2017 0.0  0.0 - 0.1 K/uL Final   Performed at [facility], [address] [place], [place] [address]    (this displays the last labs from the last 3 days)  No results found for: TOTALPROTELP, ALBUMINELP, A1GS, A2GS, BETS, BETA2SER, GAMS, MSPIKE, SPEI (this displays SPEP labs)  No results found for: KPAFRELGTCHN, LAMBDASER, KAPLAMBRATIO (kappa/lambda light chains)  No results found for: HGBA, HGBA2QUANT, HGBFQUANT, HGBSQUAN (Hemoglobinopathy evaluation)   No results found for: LDH  No results found for: IRON, TIBC, IRONPCTSAT (Iron and TIBC)  No results found for: FERRITIN  Urinalysis    Component Value Date/Time   COLORURINE COLORLESS (A) 08/09/2017 [place] 08/09/2017 2224   LABSPEC 1.004 (L) 08/09/2017 2224   PHURINE 6.0 08/09/2017 2224   GLUCOSEU NEGATIVE 08/09/2017 2224   HGBUR NEGATIVE 08/09/2017 2224   BILIRUBINUR NEGATIVE 08/09/2017 2224   KETONESUR NEGATIVE 08/09/2017 2224   PROTEINUR NEGATIVE 08/09/2017 2224   NITRITE NEGATIVE 08/09/2017 2224   LEUKOCYTESUR NEGATIVE 08/09/2017 2224     STUDIES: She had an echocardiogram completed on 08/10/2017 with a left ventricular ejection fraction of 65-70%.   ELIGIBLE FOR AVAILABLE RESEARCH PROTOCOL: no  ASSESSMENT:  67 y.o. [place] woman status post right breast upper outer quadrant biopsy 06/20/2017 for a clinical T1b N0, stage IA invasive ductal carcinoma, grade 3, estrogen and progesterone receptor negative, but HER-2 amplified, with an MIB-1 of 30%  (1) genetics testing 07/05/2017 through the Common Hereditary Cancer Panel offered by [facility] found no deleterious mutations in APC, ATM, AXIN2, BARD1, BMPR1A, BRCA1, BRCA2, BRIP1, CDH1, CDKN2A (p14ARF), CDKN2A (p16INK4a), CKD4, CHEK2, CTNNA1, DICER1, EPCAM (Deletion/duplication testing only), GREM1 (promoter region deletion/duplication testing only), KIT, MEN1, MLH1, MSH2, MSH3, MSH6, MUTYH, NBN, NF1, NHTL1, PALB2, PDGFRA, PMS2, POLD1, POLE, PTEN, RAD50, RAD51C, RAD51D, SDHB, SDHC, SDHD, SMAD4, SMARCA4. STK11, TP53, TSC1, TSC2, and VHL.  The following genes were evaluated for sequence changes only: SDHA and HOXB13 c.251G>A variant only.  (a) A Variant of uncertain significance in MSH2 was identified c.1331G>T (p.Arg444Leu).   (2) status post right lumpectomy and sentinel lymph node sampling 07/17/2017 for a pT1c pN0, stage IA invasive ductal carcinoma, grade 2, with negative margins.  A total of 5 lymph nodes were removed  (3) adjuvant chemotherapy will consist of paclitaxel weekly x12 together with trastuzumab every 21 days starting 08/07/2017  (a) paclitaxel stopped after 8 doses because of neuropathy (last dose 09/25/2017  (4) trastuzumab will be continued to total 1 year  (a) echo 08/10/2017 showed an ejection fraction in the 65-70% range  (b) echo on 11/01/2017 shows EF of 60-65%  5) adjuvant radiation 10/17/2017-11/14/2016: 40.05 Gy directed to the Right Breast in 15 fractions, followed by a boost of 10 Gy given in 5 fractions   PLAN:  April [name] is doing well today. She has completed radiation.  She has also undergone another echocardiogram which was normal.  She continues on Trastuzumab every 3 weeks with good tolerance.  She is still  experiencing peripheral neuropathy.  I prescribed Gabapentin 124m po TID for her to take.  She will try this and let me know if it helps.  DDyanawill continue to receive Trastuzumab every three weeks  And will see Dr. MJana Hakimagain in August.  She will f/u with Dr. BHaroldine Lawsfor repeat echocardiogram in August as well.    She knows to call for any other issues that may develop before the next visit.  A total of (20) minutes of face-to-face time was spent with this patient with greater than 50% of that time in counseling and care-coordination.   LWilber Bihari NP 11/20/17 1:25 PM Medical Oncology and Hematology [facility] [id number]. [phone]   Fax. [phone]

## 2017-11-20 NOTE — Patient Instructions (Signed)
[facility] Discharge Instructions for Patients Receiving Chemotherapy  Today you received the following chemotherapy agents Herceptin  To help prevent nausea and vomiting after your treatment, we encourage you to take your nausea medication as directed   If you develop nausea and vomiting that is not controlled by your nausea medication, call the clinic.   BELOW ARE SYMPTOMS THAT SHOULD BE REPORTED IMMEDIATELY:  *FEVER GREATER THAN 100.5 F  *CHILLS WITH OR WITHOUT FEVER  NAUSEA AND VOMITING THAT IS NOT CONTROLLED WITH YOUR NAUSEA MEDICATION  *UNUSUAL SHORTNESS OF BREATH  *UNUSUAL BRUISING OR BLEEDING  TENDERNESS IN MOUTH AND THROAT WITH OR WITHOUT PRESENCE OF ULCERS  *URINARY PROBLEMS  *BOWEL PROBLEMS  UNUSUAL RASH Items with * indicate a potential emergency and should be followed up as soon as possible.  Feel free to call the clinic should you have any questions or concerns. The clinic phone number is [phone].  Please show the CHEMO ALERT CARD at check-in to the Emergency Department and triage nurse.   

## 2017-11-20 NOTE — Patient Instructions (Signed)
Gabapentin capsules or tablets What is this medicine? GABAPENTIN (GA ba pen tin) is used to control partial seizures in adults with epilepsy. It is also used to treat certain types of nerve pain. This medicine may be used for other purposes; ask your health care provider or pharmacist if you have questions. COMMON BRAND NAME(S): Active-PAC with Gabapentin, Gabarone, Neurontin What should I tell my health care provider before I take this medicine? They need to know if you have any of these conditions: -kidney disease -suicidal thoughts, plans, or attempt; a previous suicide attempt by you or a family member -an unusual or allergic reaction to gabapentin, other medicines, foods, dyes, or preservatives -pregnant or trying to get pregnant -breast-feeding How should I use this medicine? Take this medicine by mouth with a glass of water. Follow the directions on the prescription label. You can take it with or without food. If it upsets your stomach, take it with food.Take your medicine at regular intervals. Do not take it more often than directed. Do not stop taking except on your doctor's advice. If you are directed to break the 600 or 800 mg tablets in half as part of your dose, the extra half tablet should be used for the next dose. If you have not used the extra half tablet within 28 days, it should be thrown away. A special MedGuide will be given to you by the pharmacist with each prescription and refill. Be sure to read this information carefully each time. Talk to your pediatrician regarding the use of this medicine in children. Special care may be needed. Overdosage: If you think you have taken too much of this medicine contact a poison control center or emergency room at once. NOTE: This medicine is only for you. Do not share this medicine with others. What if I miss a dose? If you miss a dose, take it as soon as you can. If it is almost time for your next dose, take only that dose. Do not  take double or extra doses. What may interact with this medicine? Do not take this medicine with any of the following medications: -other gabapentin products This medicine may also interact with the following medications: -alcohol -antacids -antihistamines for allergy, cough and cold -certain medicines for anxiety or sleep -certain medicines for depression or psychotic disturbances -homatropine; hydrocodone -naproxen -narcotic medicines (opiates) for pain -phenothiazines like chlorpromazine, mesoridazine, prochlorperazine, thioridazine This list may not describe all possible interactions. Give your health care provider a list of all the medicines, herbs, non-prescription drugs, or dietary supplements you use. Also tell them if you smoke, drink alcohol, or use illegal drugs. Some items may interact with your medicine. What should I watch for while using this medicine? Visit your doctor or health care professional for regular checks on your progress. You may want to keep a record at home of how you feel your condition is responding to treatment. You may want to share this information with your doctor or health care professional at each visit. You should contact your doctor or health care professional if your seizures get worse or if you have any new types of seizures. Do not stop taking this medicine or any of your seizure medicines unless instructed by your doctor or health care professional. Stopping your medicine suddenly can increase your seizures or their severity. Wear a medical identification bracelet or chain if you are taking this medicine for seizures, and carry a card that lists all your medications. You may get drowsy, dizzy,   or have blurred vision. Do not drive, use machinery, or do anything that needs mental alertness until you know how this medicine affects you. To reduce dizzy or fainting spells, do not sit or stand up quickly, especially if you are an older patient. Alcohol can  increase drowsiness and dizziness. Avoid alcoholic drinks. Your mouth may get dry. Chewing sugarless gum or sucking hard candy, and drinking plenty of water will help. The use of this medicine may increase the chance of suicidal thoughts or actions. Pay special attention to how you are responding while on this medicine. Any worsening of mood, or thoughts of suicide or dying should be reported to your health care professional right away. Women who become pregnant while using this medicine may enroll in the [place] Antiepileptic Drug Pregnancy Registry by calling 1-888-233-2334. This registry collects information about the safety of antiepileptic drug use during pregnancy. What side effects may I notice from receiving this medicine? Side effects that you should report to your doctor or health care professional as soon as possible: -allergic reactions like skin rash, itching or hives, swelling of the face, lips, or tongue -worsening of mood, thoughts or actions of suicide or dying Side effects that usually do not require medical attention (report to your doctor or health care professional if they continue or are bothersome): -constipation -difficulty walking or controlling muscle movements -dizziness -nausea -slurred speech -tiredness -tremors -weight gain This list may not describe all possible side effects. Call your doctor for medical advice about side effects. You may report side effects to FDA at 1-800-FDA-1088. Where should I keep my medicine? Keep out of reach of children. This medicine may cause accidental overdose and death if it taken by other adults, children, or pets. Mix any unused medicine with a substance like cat litter or coffee grounds. Then throw the medicine away in a sealed container like a sealed bag or a coffee can with a lid. Do not use the medicine after the expiration date. Store at room temperature between 15 and 30 degrees C (59 and 86 degrees F). NOTE: This  sheet is a summary. It may not cover all possible information. If you have questions about this medicine, talk to your doctor, pharmacist, or health care provider.  2018 Elsevier/Gold Standard (2013-08-01 15:26:50)  

## 2017-11-21 ENCOUNTER — Telehealth: Payer: Self-pay | Admitting: Adult Health

## 2017-11-21 NOTE — Telephone Encounter (Signed)
Per 6/4 no los °

## 2017-12-07 ENCOUNTER — Other Ambulatory Visit: Payer: Self-pay | Admitting: Neurology

## 2017-12-11 ENCOUNTER — Inpatient Hospital Stay: Payer: Medicare Other

## 2017-12-11 VITALS — BP 129/67 | HR 67 | Temp 98.2°F | Resp 19

## 2017-12-11 DIAGNOSIS — Z95828 Presence of other vascular implants and grafts: Secondary | ICD-10-CM

## 2017-12-11 DIAGNOSIS — C50411 Malignant neoplasm of upper-outer quadrant of right female breast: Secondary | ICD-10-CM

## 2017-12-11 DIAGNOSIS — Z5112 Encounter for antineoplastic immunotherapy: Secondary | ICD-10-CM | POA: Diagnosis not present

## 2017-12-11 DIAGNOSIS — Z171 Estrogen receptor negative status [ER-]: Principal | ICD-10-CM

## 2017-12-11 LAB — COMPREHENSIVE METABOLIC PANEL
ALT: 19 U/L (ref 0–44)
AST: 23 U/L (ref 15–41)
Albumin: 4 g/dL (ref 3.5–5.0)
Alkaline Phosphatase: 87 U/L (ref 38–126)
Anion gap: 9 (ref 5–15)
BUN: 16 mg/dL (ref 8–23)
CO2: 28 mmol/L (ref 22–32)
Calcium: 10 mg/dL (ref 8.9–10.3)
Chloride: 103 mmol/L (ref 98–111)
Creatinine, Ser: 1.02 mg/dL — ABNORMAL HIGH (ref 0.44–1.00)
GFR calc Af Amer: >60 mL/min (ref >60)
GFR calc non Af Amer: 56 mL/min — ABNORMAL LOW (ref >60)
Glucose, Bld: 109 mg/dL — ABNORMAL HIGH (ref 70–99)
Potassium: 4.1 mmol/L (ref 3.5–5.1)
Sodium: 140 mmol/L (ref 135–145)
Total Bilirubin: 0.3 mg/dL (ref 0.3–1.2)
Total Protein: 7.5 g/dL (ref 6.5–8.1)

## 2017-12-11 LAB — CBC WITH DIFFERENTIAL/PLATELET
Basophils Absolute: 0 10*3/uL (ref 0.0–0.1)
Basophils Relative: 1 %
Eosinophils Absolute: 0.1 10*3/uL (ref 0.0–0.5)
Eosinophils Relative: 3 %
HCT: 31.4 % — ABNORMAL LOW (ref 34.8–46.6)
Hemoglobin: 10.4 g/dL — ABNORMAL LOW (ref 11.6–15.9)
Lymphocytes Relative: 33 %
Lymphs Abs: 1.2 10*3/uL (ref 0.9–3.3)
MCH: 29 pg (ref 25.1–34.0)
MCHC: 33.1 g/dL (ref 31.5–36.0)
MCV: 87.5 fL (ref 79.5–101.0)
Monocytes Absolute: 0.4 10*3/uL (ref 0.1–0.9)
Monocytes Relative: 10 %
Neutro Abs: 2 10*3/uL (ref 1.5–6.5)
Neutrophils Relative %: 53 %
Platelets: 252 10*3/uL (ref 145–400)
RBC: 3.59 MIL/uL — ABNORMAL LOW (ref 3.70–5.45)
RDW: 14.3 % (ref 11.2–14.5)
WBC: 3.7 10*3/uL — ABNORMAL LOW (ref 3.9–10.3)

## 2017-12-11 MED ORDER — DIPHENHYDRAMINE HCL 25 MG PO CAPS
ORAL_CAPSULE | ORAL | Status: AC
  Filled 2017-12-11: qty 2

## 2017-12-11 MED ORDER — HEPARIN SOD (PORK) LOCK FLUSH 100 UNIT/ML IV SOLN
500.0000 [IU] | Freq: Once | INTRAVENOUS | Status: AC | PRN
  Administered 2017-12-11: 500 [IU]
  Filled 2017-12-11: qty 5

## 2017-12-11 MED ORDER — DIPHENHYDRAMINE HCL 25 MG PO CAPS
50.0000 mg | ORAL_CAPSULE | Freq: Once | ORAL | Status: AC
  Administered 2017-12-11: 50 mg via ORAL

## 2017-12-11 MED ORDER — SODIUM CHLORIDE 0.9% FLUSH
10.0000 mL | INTRAVENOUS | Status: DC | PRN
  Administered 2017-12-11: 10 mL
  Filled 2017-12-11: qty 10

## 2017-12-11 MED ORDER — SODIUM CHLORIDE 0.9 % IV SOLN
450.0000 mg | Freq: Once | INTRAVENOUS | Status: AC
  Administered 2017-12-11: 450 mg via INTRAVENOUS
  Filled 2017-12-11: qty 21.43

## 2017-12-11 MED ORDER — SODIUM CHLORIDE 0.9 % IV SOLN
Freq: Once | INTRAVENOUS | Status: AC
  Administered 2017-12-11: 13:00:00 via INTRAVENOUS

## 2017-12-11 MED ORDER — ACETAMINOPHEN 325 MG PO TABS
ORAL_TABLET | ORAL | Status: AC
  Filled 2017-12-11: qty 2

## 2017-12-11 MED ORDER — ACETAMINOPHEN 325 MG PO TABS
650.0000 mg | ORAL_TABLET | Freq: Once | ORAL | Status: AC
  Administered 2017-12-11: 650 mg via ORAL

## 2017-12-11 NOTE — Patient Instructions (Signed)
[facility] Discharge Instructions for Patients Receiving Chemotherapy Today you received the following chemotherapy agents:  Herceptin To help prevent nausea and vomiting after your treatment, we encourage you to take your nausea medication as prescribed.   If you develop nausea and vomiting that is not controlled by your nausea medication, call the clinic.   BELOW ARE SYMPTOMS THAT SHOULD BE REPORTED IMMEDIATELY:  *FEVER GREATER THAN 100.5 F  *CHILLS WITH OR WITHOUT FEVER  NAUSEA AND VOMITING THAT IS NOT CONTROLLED WITH YOUR NAUSEA MEDICATION  *UNUSUAL SHORTNESS OF BREATH  *UNUSUAL BRUISING OR BLEEDING  TENDERNESS IN MOUTH AND THROAT WITH OR WITHOUT PRESENCE OF ULCERS  *URINARY PROBLEMS  *BOWEL PROBLEMS  UNUSUAL RASH Items with * indicate a potential emergency and should be followed up as soon as possible.  Feel free to call the clinic should you have any questions or concerns. The clinic phone number is [phone].  Please show the CHEMO ALERT CARD at check-in to the Emergency Department and triage nurse.   

## 2017-12-12 ENCOUNTER — Encounter: Payer: Self-pay | Admitting: Radiation Oncology

## 2017-12-12 NOTE — Progress Notes (Signed)
Ms. Mcgonagle presents for follow up of radiation completed 11/14/17 to her Right Breast. She reports that her skin has healed well. She continues to use radiaplex daily, and will switch to a vitamin E containing lotion when completed.  She reports soreness to her incision site. She also reports sharp, shooting pains to her bilateral breasts. She continues to receive Trastuzumab every 3 weeks. She will see Dr. Jana Hakim next on 01/01/18.   BP 128/75 (BP Location: Left Arm, Patient Position: Sitting, Cuff Size: Normal)   Pulse 75   Temp 98.3 F (36.8 C) (Oral)   Resp 20   Wt 151 lb 12.8 oz (68.9 kg)   SpO2 100%   BMI 26.89 kg/m    Wt Readings from Last 3 Encounters:  12/14/17 151 lb 12.8 oz (68.9 kg)  11/20/17 150 lb 11.2 oz (68.4 kg)  11/01/17 152 lb 6.4 oz (69.1 kg)

## 2017-12-14 ENCOUNTER — Other Ambulatory Visit: Payer: Self-pay

## 2017-12-14 ENCOUNTER — Encounter: Payer: Self-pay | Admitting: Radiation Oncology

## 2017-12-14 ENCOUNTER — Ambulatory Visit
Admission: RE | Admit: 2017-12-14 | Discharge: 2017-12-14 | Disposition: A | Discharge status: Home / Self Care | Payer: Medicare Other | Source: Ambulatory Visit | Attending: Radiation Oncology | Admitting: Radiation Oncology

## 2017-12-14 VITALS — BP 128/75 | HR 75 | Temp 98.3°F | Resp 20 | Wt 151.8 lb

## 2017-12-14 DIAGNOSIS — Z882 Allergy status to sulfonamides status: Secondary | ICD-10-CM | POA: Insufficient documentation

## 2017-12-14 DIAGNOSIS — Z08 Encounter for follow-up examination after completed treatment for malignant neoplasm: Secondary | ICD-10-CM | POA: Insufficient documentation

## 2017-12-14 DIAGNOSIS — Z171 Estrogen receptor negative status [ER-]: Secondary | ICD-10-CM | POA: Diagnosis present

## 2017-12-14 DIAGNOSIS — Z886 Allergy status to analgesic agent status: Secondary | ICD-10-CM | POA: Diagnosis not present

## 2017-12-14 DIAGNOSIS — Y842 Radiological procedure and radiotherapy as the cause of abnormal reaction of the patient, or of later complication, without mention of misadventure at the time of the procedure: Secondary | ICD-10-CM | POA: Insufficient documentation

## 2017-12-14 DIAGNOSIS — C50411 Malignant neoplasm of upper-outer quadrant of right female breast: Secondary | ICD-10-CM | POA: Diagnosis present

## 2017-12-14 DIAGNOSIS — Z923 Personal history of irradiation: Secondary | ICD-10-CM | POA: Diagnosis not present

## 2017-12-14 DIAGNOSIS — Z888 Allergy status to other drugs, medicaments and biological substances status: Secondary | ICD-10-CM | POA: Insufficient documentation

## 2017-12-14 DIAGNOSIS — Z853 Personal history of malignant neoplasm of breast: Secondary | ICD-10-CM | POA: Diagnosis not present

## 2017-12-14 HISTORY — DX: Personal history of irradiation: Z92.3

## 2017-12-14 NOTE — Progress Notes (Signed)
Radiation Oncology         [phone] ________________________________  Name: April [name] MRN: [id number]  Date: 12/14/2017  DOB: 11-Nov-1950  Follow-Up Visit Note  Outpatient  CC: Kelton Pillar, MD  Magrinat, Virgie Dad, MD  Diagnosis and Prior Radiotherapy:    ICD-10-CM   1. Malignant neoplasm of upper-outer quadrant of right breast in female, estrogen receptor negative ([place]) C50.411    Z17.1    [id number] UOQ Invasive Ductal Carcinomawith DCIS, ER(-)/ PR(-)/ Her2(+), Grade3.  10/17/17-11/14/17: 40.05 Gy directed to the Right Breast in 15 fractions, followed by a boost of 10 Gy given in 5 fractions.  CHIEF COMPLAINT: Here for follow-up and surveillance of right breast cancer  Narrative:  The patient returns today for routine follow-up. She reports her skin has healed well. She continues to apply Radiaplex daily and will switch to Vitamin E containing lotion when completed. She complains of soreness to her incision site as well as sharp, shooting pains to bilateral breasts, right>left. She reports she hasn't had this pain in several days.  She continues on Trastuzumab every 3 weeks. She will follow-up with Dr. Jana Hakim on 01/01/18.                             ALLERGIES:  is allergic to tape; nsaids; antihistamines, diphenhydramine-type; and sulfa antibiotics.  Meds: Current Outpatient Medications  Medication Sig Dispense Refill  . aspirin 81 MG chewable tablet Chew 81 mg by mouth daily.    Marland Kitchen CALCIUM-VITAMIN D PO Take 1 tablet by mouth daily.    . Coenzyme Q10 (COQ10 PO) Take by mouth daily.    . ergocalciferol (VITAMIN D2) 50000 units capsule Take 50,000 Units by mouth once a week.    . gabapentin (NEURONTIN) 100 MG capsule Take 1 capsule (100 mg total) by mouth 3 (three) times daily. 90 capsule 0  . hydrochlorothiazide (MICROZIDE) 12.5 MG capsule Take 1 capsule (12.5 mg total) by mouth daily. 90 capsule 3  . lidocaine-prilocaine (EMLA) cream  Apply 1 application topically as needed. 30 g 0  . lisinopril (PRINIVIL,ZESTRIL) 20 MG tablet Take 20 mg by mouth daily.    . pravastatin (PRAVACHOL) 80 MG tablet Take 1 tablet (80 mg total) by mouth daily at 6 PM. 60 tablet 0  . vitamin B-12 (CYANOCOBALAMIN) 1000 MCG tablet Take 1,000 mcg by mouth daily.     No current facility-administered medications for this encounter.     Physical Findings: The patient is in no acute distress. Patient is alert and oriented.  weight is 151 lb 12.8 oz (68.9 kg). Her oral temperature is 98.3 F (36.8 C). Her blood pressure is 128/75 and her pulse is 75. Her respiration is 20 and oxygen saturation is 100%.    Satisfactory skin healing in radiotherapy fields. Mild hyperpigmentation remaining over the right breast. She has expected scarred tissue palpated under lumpectomy and biopsy scars on the right.   Lab Findings: Lab Results  Component Value Date   WBC 3.7 (L) 12/11/2017   HGB 10.4 (L) 12/11/2017   HCT 31.4 (L) 12/11/2017   MCV 87.5 12/11/2017   PLT 252 12/11/2017    Radiographic Findings: No results found.  Impression/Plan: Healing well from radiotherapy to the breast tissue.  Continue skin care with topical Vitamin E Oil and / or lotion for at least 2 more months for further healing.  I encouraged her to continue with yearly mammography as appropriate (for intact breast  tissue) and followup with medical oncology. I will see her back on an as-needed basis. I have encouraged her to call if she has any issues or concerns in the future. I wished her the very best.   I spent 10 minutes face to face with the patient and more than 50% of that time was spent in counseling and/or coordination of care. _____________________________________   Eppie Gibson, MD  This document serves as a record of services personally performed by Eppie Gibson, MD. It was created on her behalf by Bethann Humble, a trained medical scribe. The creation of this record is  based on the scribe's personal observations and the provider's statements to them. This document has been checked and approved by the attending provider.

## 2017-12-31 NOTE — Progress Notes (Signed)
[place]  [phone]) [phone] [phone]     ID: April [name] DOB: May 07, 1951  MR#: [id number]  YIR#:[id number]  Patient Care Team: Kelton Pillar, MD as PCP - General (Family Medicine) Magrinat, Virgie Dad, MD as Consulting Physician (Oncology) Rolm Bookbinder, MD as Consulting Physician (General Surgery) Eppie Gibson, MD as Attending Physician (Radiation Oncology) Newt Minion, MD as Consulting Physician (Orthopedic Surgery) Regal, Tamala Fothergill, DPM as Consulting Physician (Podiatry) Neldon Mc, Donnamarie Poag, MD as Consulting Physician (Allergy and Immunology) OTHER MD:  CHIEF COMPLAINT: Estrogen receptor negative breast cancer  CURRENT TREATMENT: trastuzumab   HISTORY OF CURRENT ILLNESS: From the original intake note:  "April [name]" had bilateral screening mammography at [facility] 06/06/2017.  This showed a possible mass in the right breast at the 11 o'clock position.  On 06/13/2017 she underwent right diagnostic mammography and ultrasonography.  Breast density was category C.  In the right breast at the 11 o'clock position there was a 1 cm area by mammography.  By ultrasound this confirmed a 1.0 cm irregular mass with lobulated margins in the upper outer quadrant of the right breast.  There was a second, 0.4 cm lobulated mass in the same quadrant.  The right axilla was sonographically benign.  On 06/20/2017 biopsy of the 2 right breast masses in question was performed.  The final pathology (SAA [id number]) found the smaller mass to be only fibrocystic change.  This is felt to be concordant.  The larger mass however was an invasive ductal carcinoma, grade 3, estrogen and progesterone receptor negative, with an MIB-1 of 30%, and HER-2 amplified, with a signals ratio of 2.24.  The number per cell was 4.60.  The patient's subsequent history is as detailed below.  INTERVAL HISTORY: April [name] returns to the office today for follow up of her estrogen receptor negative breast  cancer. She receives trastuzumab every 21 days, with a dose due today. She tolerates this well.  She notes that she still feels numbness and tingling in her fingertips and toes from chemotherapy. She started doing ROM and stretching exercises to help. She also took low dose gabapentin, but it didn't help much  Her echocardiogram late May showed a well-preserved ejection fraction.  Her next echocardiogram is already scheduled for late August.   REVIEW OF SYSTEMS: April [name] reports that she has been retired since July 2018. She is planning to go on a cruise from 09/17-/09/22. She takes 800 units of vitamin D. She denies having constipation. For exercise, she goes to the gym about 3 times per week. She walks for about  0.5 miles, although the neuropathy in her feet sometimes feel like burning. She also rides the stationary bike. She denies unusual headaches, visual changes, nausea, vomiting, or dizziness. There has been no unusual cough, phlegm production, or pleurisy. This been no change in bowel or bladder habits. She denies unexplained fatigue or unexplained weight loss, bleeding, rash, or fever. A detailed review of systems was otherwise stable.    PAST MEDICAL HISTORY: Past Medical History:  Diagnosis Date  . Breast cancer ([place]) 05/2017   right breast  . Eczema   . Family history of breast cancer   . Family history of ovarian cancer   . History of radiation therapy 10/17/17- 11/14/17   40.05 directed to the right breast in 15 fractions, followed by a boost of 10 gy given in 5 fractions.   . Hypertension    toxemia during pregnancy, no meds now    PAST SURGICAL HISTORY: Past  Surgical History:  Procedure Laterality Date  . ABDOMINAL HYSTERECTOMY    . BREAST LUMPECTOMY WITH RADIOACTIVE SEED AND SENTINEL LYMPH NODE BIOPSY Right 07/17/2017   Procedure: BREAST LUMPECTOMY WITH RADIOACTIVE SEED AND SENTINEL LYMPH NODE BIOPSY;  Surgeon: Rolm Bookbinder, MD;  Location: [facility];   Service: General;  Laterality: Right;  . CESAREAN SECTION     x4  . DILATION AND CURETTAGE OF UTERUS    . KNEE SURGERY Right   . PORTACATH PLACEMENT N/A 07/17/2017   Procedure: INSERTION PORT-A-CATH WITH [place];  Surgeon: Rolm Bookbinder, MD;  Location: [facility];  Service: General;  Laterality: N/A;    FAMILY HISTORY Family History  Problem Relation Age of Onset  . Breast cancer Mother 80       again at 72 in other breast   . Heart attack Father 54  . Breast cancer Sister 21  . Breast cancer Maternal Grandmother 15       spread to lungs, died at 56  . Ovarian cancer Cousin 6  . Prostate cancer Cousin   The patient's father died at age 58 from a heart attack.  The patient's mother is currently living at age 5 (as of January 2019).  The patient had 1 sister who was diagnosed with breast cancer at age 68 and died from metastatic disease at age 58.  The patient has 1 brother.  In addition the patient's mother was diagnosed with breast cancer at age 37, on the left side, and now has a right-sided breast cancer diagnosed in January 2019.  There is in addition a cousin with ovarian cancer diagnosed when she was 68 years old  GYNECOLOGIC HISTORY:  No LMP recorded. Patient has had a hysterectomy. Menarche age 73, first live birth age 19, the patient had 3 live births, 1 of whom survived only 2 days.  She underwent hysterectomy without salpingo-oophorectomy September 05, 1978.  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  She is now retired.  Her husband April [name] his Theme park manager at [employer].  The patient's daughter April [name] lives in [place] where she is an Tourist information centre manager.  The patient's daughter April [name] lives in [place] working for the department of defense.  The patient has 3 grandchildren.     ADVANCED DIRECTIVES: Not in  place   HEALTH MAINTENANCE: Social History   Tobacco Use  . Smoking status: Never Smoker  . Smokeless tobacco: Never Used  Substance Use Topics  . Alcohol use: Yes    Comment: social  . Drug use: No     Colonoscopy: April 2018/[facility]  PAP: Status post hysterectomy  Bone density:   Allergies  Allergen Reactions  . Tape Itching and Rash  . Nsaids   . Antihistamines, Diphenhydramine-Type Other (See Comments)    Pt stated she gets lump in throat and stomach when taking histamines.   . Sulfa Antibiotics Rash    Current Outpatient Medications  Medication Sig Dispense Refill  . aspirin 81 MG chewable tablet Chew 81 mg by mouth daily.    Marland Kitchen CALCIUM-VITAMIN D PO Take 1 tablet by mouth daily.    . Coenzyme Q10 (COQ10 PO) Take by mouth daily.    . ergocalciferol (VITAMIN D2) 50000 units capsule Take 50,000 Units by mouth once a week.    . gabapentin (NEURONTIN) 100 MG capsule Take 1 capsule (  100 mg total) by mouth 3 (three) times daily. 90 capsule 0  . hydrochlorothiazide (MICROZIDE) 12.5 MG capsule Take 1 capsule (12.5 mg total) by mouth daily. 90 capsule 3  . lidocaine-prilocaine (EMLA) cream Apply 1 application topically as needed. 30 g 0  . lisinopril (PRINIVIL,ZESTRIL) 20 MG tablet Take 20 mg by mouth daily.    . pravastatin (PRAVACHOL) 80 MG tablet Take 1 tablet (80 mg total) by mouth daily at 6 PM. 60 tablet 0  . vitamin B-12 (CYANOCOBALAMIN) 1000 MCG tablet Take 1,000 mcg by mouth daily.     No current facility-administered medications for this visit.     OBJECTIVE: Middle-aged African-American woman who appears well  Vitals:   01/01/18 0819  BP: (!) 152/61  Pulse: 91  Resp: 18  Temp: 98.1 F (36.7 C)  SpO2: 100%     Body mass index is 27.23 kg/m.    Wt Readings from Last 3 Encounters:  01/01/18 153 lb 11.2 oz (69.7 kg)  12/14/17 151 lb 12.8 oz (68.9 kg)  11/20/17 150 lb 11.2 oz (68.4 kg)  ECOG FS:0 - Asymptomatic   Sclerae unicteric, EOMs intact Oropharynx  clear and moist No cervical or supraclavicular adenopathy Lungs no rales or rhonchi Heart regular rate and rhythm Abd soft, nontender, positive bowel sounds MSK no focal spinal tenderness, no upper extremity lymphedema Neuro: nonfocal, well oriented, appropriate affect Breasts: The right breast is status post lumpectomy and radiation.  The incisions have healed very nicely.  The cosmetic result is excellent.  The hyperpigmentation is feeding.  There is no evidence of disease recurrence.  The left breast is benign.  Both axillae are benign.  LAB RESULTS:  CMP     Component Value Date/Time   NA 140 12/11/2017 1059   K 4.1 12/11/2017 1059   CL 103 12/11/2017 1059   CO2 28 12/11/2017 1059   GLUCOSE 109 (H) 12/11/2017 1059   BUN 16 12/11/2017 1059   CREATININE 1.02 (H) 12/11/2017 1059   CREATININE 1.01 06/27/2017 0843   CALCIUM 10.0 12/11/2017 1059   PROT 7.5 12/11/2017 1059   ALBUMIN 4.0 12/11/2017 1059   AST 23 12/11/2017 1059   AST 16 06/27/2017 0843   ALT 19 12/11/2017 1059   ALT 14 06/27/2017 0843   ALKPHOS 87 12/11/2017 1059   BILITOT 0.3 12/11/2017 1059   BILITOT 0.2 06/27/2017 0843   GFRNONAA 56 (L) 12/11/2017 1059   GFRNONAA 56 (L) 06/27/2017 0843   GFRAA >60 12/11/2017 1059   GFRAA >60 06/27/2017 0843    No results found for: TOTALPROTELP, ALBUMINELP, A1GS, A2GS, BETS, BETA2SER, GAMS, MSPIKE, SPEI  No results found for: KPAFRELGTCHN, LAMBDASER, KAPLAMBRATIO  Lab Results  Component Value Date   WBC 3.9 01/01/2018   NEUTROABS 1.8 01/01/2018   HGB 10.0 (L) 01/01/2018   HCT 29.7 (L) 01/01/2018   MCV 86.1 01/01/2018   PLT 226 01/01/2018    _0 @  No results found for: LABCA2  No components found for: GEZMOQ947  No results for input(s): INR in the last 168 hours.  No results found for: LABCA2  No results found for: MLY650  No results found for: PTW656  No results found for: CLE751  No results found for: CA2729  No components found for:  HGQUANT  No results found for: CEA1 / No results found for: CEA1   No results found for: AFPTUMOR  No results found for: CHROMOGRNA  No results found for: PSA1  Appointment on 01/01/2018  Component Date Value Ref  Range Status  . WBC 01/01/2018 3.9  3.9 - 10.3 K/uL Final  . RBC 01/01/2018 3.45* 3.70 - 5.45 MIL/uL Final  . Hemoglobin 01/01/2018 10.0* 11.6 - 15.9 g/dL Final  . HCT 01/01/2018 29.7* 34.8 - 46.6 % Final  . MCV 01/01/2018 86.1  79.5 - 101.0 fL Final  . MCH 01/01/2018 29.0  25.1 - 34.0 pg Final  . MCHC 01/01/2018 33.7  31.5 - 36.0 g/dL Final  . RDW 01/01/2018 13.7  11.2 - 14.5 % Final  . Platelets 01/01/2018 226  145 - 400 K/uL Final  . Neutrophils Relative % 01/01/2018 47  % Final  . Neutro Abs 01/01/2018 1.8  1.5 - 6.5 K/uL Final  . Lymphocytes Relative 01/01/2018 38  % Final  . Lymphs Abs 01/01/2018 1.5  0.9 - 3.3 K/uL Final  . Monocytes Relative 01/01/2018 10  % Final  . Monocytes Absolute 01/01/2018 0.4  0.1 - 0.9 K/uL Final  . Eosinophils Relative 01/01/2018 4  % Final  . Eosinophils Absolute 01/01/2018 0.2  0.0 - 0.5 K/uL Final  . Basophils Relative 01/01/2018 1  % Final  . Basophils Absolute 01/01/2018 0.0  0.0 - 0.1 K/uL Final   Performed at [facility], [address] [place], [place] [address]    (this displays the last labs from the last 3 days)  No results found for: TOTALPROTELP, ALBUMINELP, A1GS, A2GS, BETS, BETA2SER, GAMS, MSPIKE, SPEI (this displays SPEP labs)  No results found for: KPAFRELGTCHN, LAMBDASER, KAPLAMBRATIO (kappa/lambda light chains)  No results found for: HGBA, HGBA2QUANT, HGBFQUANT, HGBSQUAN (Hemoglobinopathy evaluation)   No results found for: LDH  No results found for: IRON, TIBC, IRONPCTSAT (Iron and TIBC)  No results found for: FERRITIN  Urinalysis    Component Value Date/Time   COLORURINE COLORLESS (A) 08/09/2017 2224   APPEARANCEUR CLEAR 08/09/2017 2224   LABSPEC 1.004 (L) 08/09/2017  2224   PHURINE 6.0 08/09/2017 2224   GLUCOSEU NEGATIVE 08/09/2017 2224   HGBUR NEGATIVE 08/09/2017 2224   BILIRUBINUR NEGATIVE 08/09/2017 2224   KETONESUR NEGATIVE 08/09/2017 2224   PROTEINUR NEGATIVE 08/09/2017 2224   NITRITE NEGATIVE 08/09/2017 2224   LEUKOCYTESUR NEGATIVE 08/09/2017 2224     STUDIES: Echocardiogram results discussed with the patient  ELIGIBLE FOR AVAILABLE RESEARCH PROTOCOL: no  ASSESSMENT: 67 y.o. [place] woman status post right breast upper outer quadrant biopsy 06/20/2017 for a clinical T1b N0, stage IA invasive ductal carcinoma, grade 3, estrogen and progesterone receptor negative, but HER-2 amplified, with an MIB-1 of 30%  (1) genetics testing 07/05/2017 through the Common Hereditary Cancer Panel offered by [facility] found no deleterious mutations in APC, ATM, AXIN2, BARD1, BMPR1A, BRCA1, BRCA2, BRIP1, CDH1, CDKN2A (p14ARF), CDKN2A (p16INK4a), CKD4, CHEK2, CTNNA1, DICER1, EPCAM (Deletion/duplication testing only), GREM1 (promoter region deletion/duplication testing only), KIT, MEN1, MLH1, MSH2, MSH3, MSH6, MUTYH, NBN, NF1, NHTL1, PALB2, PDGFRA, PMS2, POLD1, POLE, PTEN, RAD50, RAD51C, RAD51D, SDHB, SDHC, SDHD, SMAD4, SMARCA4. STK11, TP53, TSC1, TSC2, and VHL.  The following genes were evaluated for sequence changes only: SDHA and HOXB13 c.251G>A variant only.  (a) a Variant of uncertain significance in MSH2 was identified c.1331G>T (p.Arg444Leu).   (2) status post right lumpectomy and sentinel lymph node sampling 07/17/2017 for a pT1c pN0, stage IA invasive ductal carcinoma, grade 2, with negative margins.  A total of 5 lymph nodes were removed  (3) adjuvant chemotherapy consisting of paclitaxel weekly x12 together with trastuzumab every 21 days starting 08/07/2017  (a) paclitaxel stopped after 8 doses because  of neuropathy (last dose 09/25/2017  (4) trastuzumab will be continued to total 1 year (through mid FEB 2020)  (a) echo 08/10/2017 showed an  ejection fraction in the 65-70% range  (b) echo on 11/01/2017 shows EF of 60-65%  5) adjuvant radiation 10/17/2017-11/14/2016: 40.05 Gy directed to the Right Breast in 15 fractions, followed by a boost of 10 Gy given in 5 fractions   PLAN:  Adriyana is now half a year out from definitive surgery for her breast cancer.  She is tolerating the trastuzumab well.  The plan will be to continue that to total a year.  She has a cruise coming in July and I have adjusted her Herceptin doses and treatment times accordingly  She still has a little bit of neuropathy.  The gabapentin she feels is helping.  I am changing it to 300 mg 1 tablet at bedtime as needed  She is going to see me again in November.  She will be having another echocardiogram around that time.  We will then adjust the times for her remaining treatments as well as her December mammography  She knows to call for any other issues that may develop before the next visit.  Magrinat, Virgie Dad, MD  01/01/18 8:39 AM Medical Oncology and Hematology [facility] [address] [place], [place] [address] Tel. [phone]    Fax. [phone]  Alice Rieger, am acting as scribe for Chauncey Cruel MD.  I, Lurline Del MD, have reviewed the above documentation for accuracy and completeness, and I agree with the above.

## 2018-01-01 ENCOUNTER — Ambulatory Visit: Payer: Medicare Other

## 2018-01-01 ENCOUNTER — Other Ambulatory Visit: Payer: Medicare Other

## 2018-01-01 ENCOUNTER — Ambulatory Visit: Payer: Medicare Other | Admitting: Oncology

## 2018-01-01 ENCOUNTER — Inpatient Hospital Stay: Payer: Medicare Other

## 2018-01-01 ENCOUNTER — Inpatient Hospital Stay (HOSPITAL_BASED_OUTPATIENT_CLINIC_OR_DEPARTMENT_OTHER): Payer: Medicare Other | Admitting: Oncology

## 2018-01-01 ENCOUNTER — Inpatient Hospital Stay: Payer: Medicare Other | Attending: Oncology

## 2018-01-01 VITALS — BP 152/61 | HR 91 | Temp 98.1°F | Resp 18 | Ht 63.0 in | Wt 153.7 lb

## 2018-01-01 DIAGNOSIS — Z95828 Presence of other vascular implants and grafts: Secondary | ICD-10-CM

## 2018-01-01 DIAGNOSIS — Z5112 Encounter for antineoplastic immunotherapy: Secondary | ICD-10-CM | POA: Insufficient documentation

## 2018-01-01 DIAGNOSIS — C50411 Malignant neoplasm of upper-outer quadrant of right female breast: Secondary | ICD-10-CM | POA: Insufficient documentation

## 2018-01-01 DIAGNOSIS — Z79899 Other long term (current) drug therapy: Secondary | ICD-10-CM | POA: Diagnosis not present

## 2018-01-01 DIAGNOSIS — G629 Polyneuropathy, unspecified: Secondary | ICD-10-CM | POA: Diagnosis not present

## 2018-01-01 DIAGNOSIS — Z171 Estrogen receptor negative status [ER-]: Secondary | ICD-10-CM

## 2018-01-01 LAB — COMPREHENSIVE METABOLIC PANEL
ALT: 15 U/L (ref 0–44)
AST: 16 U/L (ref 15–41)
Albumin: 4 g/dL (ref 3.5–5.0)
Alkaline Phosphatase: 80 U/L (ref 38–126)
Anion gap: 9 (ref 5–15)
BUN: 12 mg/dL (ref 8–23)
CO2: 28 mmol/L (ref 22–32)
Calcium: 9.7 mg/dL (ref 8.9–10.3)
Chloride: 104 mmol/L (ref 98–111)
Creatinine, Ser: 0.98 mg/dL (ref 0.44–1.00)
GFR calc Af Amer: >60 mL/min (ref >60)
GFR calc non Af Amer: 58 mL/min — ABNORMAL LOW (ref >60)
Glucose, Bld: 110 mg/dL — ABNORMAL HIGH (ref 70–99)
Potassium: 3.8 mmol/L (ref 3.5–5.1)
Sodium: 141 mmol/L (ref 135–145)
Total Bilirubin: <0.2 mg/dL — ABNORMAL LOW (ref 0.3–1.2)
Total Protein: 7.2 g/dL (ref 6.5–8.1)

## 2018-01-01 LAB — CBC WITH DIFFERENTIAL/PLATELET
Basophils Absolute: 0 10*3/uL (ref 0.0–0.1)
Basophils Relative: 1 %
Eosinophils Absolute: 0.2 10*3/uL (ref 0.0–0.5)
Eosinophils Relative: 4 %
HCT: 29.7 % — ABNORMAL LOW (ref 34.8–46.6)
Hemoglobin: 10 g/dL — ABNORMAL LOW (ref 11.6–15.9)
Lymphocytes Relative: 38 %
Lymphs Abs: 1.5 10*3/uL (ref 0.9–3.3)
MCH: 29 pg (ref 25.1–34.0)
MCHC: 33.7 g/dL (ref 31.5–36.0)
MCV: 86.1 fL (ref 79.5–101.0)
Monocytes Absolute: 0.4 10*3/uL (ref 0.1–0.9)
Monocytes Relative: 10 %
Neutro Abs: 1.8 10*3/uL (ref 1.5–6.5)
Neutrophils Relative %: 47 %
Platelets: 226 10*3/uL (ref 145–400)
RBC: 3.45 MIL/uL — ABNORMAL LOW (ref 3.70–5.45)
RDW: 13.7 % (ref 11.2–14.5)
WBC: 3.9 10*3/uL (ref 3.9–10.3)

## 2018-01-01 MED ORDER — GABAPENTIN 300 MG PO CAPS: 300.0000 mg | ORAL_CAPSULE | Freq: Every day | ORAL | 4 refills | Status: DC

## 2018-01-01 MED ORDER — ACETAMINOPHEN 325 MG PO TABS
ORAL_TABLET | ORAL | Status: AC
  Filled 2018-01-01: qty 2

## 2018-01-01 MED ORDER — HEPARIN SOD (PORK) LOCK FLUSH 100 UNIT/ML IV SOLN
500.0000 [IU] | Freq: Once | INTRAVENOUS | Status: AC | PRN
  Administered 2018-01-01: 500 [IU]
  Filled 2018-01-01: qty 5

## 2018-01-01 MED ORDER — DIPHENHYDRAMINE HCL 25 MG PO CAPS
25.0000 mg | ORAL_CAPSULE | Freq: Once | ORAL | Status: AC
  Administered 2018-01-01: 25 mg via ORAL

## 2018-01-01 MED ORDER — SODIUM CHLORIDE 0.9% FLUSH
10.0000 mL | INTRAVENOUS | Status: DC | PRN
  Administered 2018-01-01: 10 mL
  Filled 2018-01-01: qty 10

## 2018-01-01 MED ORDER — ACETAMINOPHEN 325 MG PO TABS
650.0000 mg | ORAL_TABLET | Freq: Once | ORAL | Status: AC
  Administered 2018-01-01: 650 mg via ORAL

## 2018-01-01 MED ORDER — DIPHENHYDRAMINE HCL 25 MG PO CAPS
ORAL_CAPSULE | ORAL | Status: AC
  Filled 2018-01-01: qty 1

## 2018-01-01 MED ORDER — SODIUM CHLORIDE 0.9 % IV SOLN
450.0000 mg | Freq: Once | INTRAVENOUS | Status: AC
  Administered 2018-01-01: 450 mg via INTRAVENOUS
  Filled 2018-01-01: qty 21.43

## 2018-01-01 MED ORDER — SODIUM CHLORIDE 0.9 % IV SOLN
Freq: Once | INTRAVENOUS | Status: AC
  Administered 2018-01-01: 09:00:00 via INTRAVENOUS

## 2018-01-01 NOTE — Addendum Note (Signed)
Addended by: Chauncey Cruel on: 01/01/2018 08:58 AM   Modules accepted: Orders

## 2018-01-02 ENCOUNTER — Telehealth: Payer: Self-pay | Admitting: Oncology

## 2018-01-02 NOTE — Telephone Encounter (Signed)
Per 7/16 mailed calendar

## 2018-01-03 ENCOUNTER — Other Ambulatory Visit: Payer: Self-pay | Admitting: *Deleted

## 2018-01-14 ENCOUNTER — Other Ambulatory Visit: Payer: Self-pay | Admitting: Neurology

## 2018-01-14 NOTE — Telephone Encounter (Signed)
Patient was given a one time refill for pravastatin. Pt was schedule to see her PCP in May 2019 for cholesterol management, and take over refills. Medication denied.

## 2018-01-22 ENCOUNTER — Inpatient Hospital Stay: Payer: Medicare Other | Attending: Oncology

## 2018-01-22 ENCOUNTER — Inpatient Hospital Stay: Payer: Medicare Other

## 2018-01-22 VITALS — BP 142/80 | HR 65 | Temp 98.2°F | Resp 18

## 2018-01-22 DIAGNOSIS — Z5112 Encounter for antineoplastic immunotherapy: Secondary | ICD-10-CM | POA: Insufficient documentation

## 2018-01-22 DIAGNOSIS — Z171 Estrogen receptor negative status [ER-]: Secondary | ICD-10-CM

## 2018-01-22 DIAGNOSIS — Z95828 Presence of other vascular implants and grafts: Secondary | ICD-10-CM

## 2018-01-22 DIAGNOSIS — C50411 Malignant neoplasm of upper-outer quadrant of right female breast: Secondary | ICD-10-CM | POA: Insufficient documentation

## 2018-01-22 LAB — CBC WITH DIFFERENTIAL/PLATELET
Basophils Absolute: 0 10*3/uL (ref 0.0–0.1)
Basophils Relative: 1 %
Eosinophils Absolute: 0.1 10*3/uL (ref 0.0–0.5)
Eosinophils Relative: 3 %
HCT: 30.5 % — ABNORMAL LOW (ref 34.8–46.6)
Hemoglobin: 10.2 g/dL — ABNORMAL LOW (ref 11.6–15.9)
Lymphocytes Relative: 39 %
Lymphs Abs: 1.5 10*3/uL (ref 0.9–3.3)
MCH: 29 pg (ref 25.1–34.0)
MCHC: 33.6 g/dL (ref 31.5–36.0)
MCV: 86.2 fL (ref 79.5–101.0)
Monocytes Absolute: 0.4 10*3/uL (ref 0.1–0.9)
Monocytes Relative: 10 %
Neutro Abs: 1.8 10*3/uL (ref 1.5–6.5)
Neutrophils Relative %: 47 %
Platelets: 234 10*3/uL (ref 145–400)
RBC: 3.53 MIL/uL — ABNORMAL LOW (ref 3.70–5.45)
RDW: 13.9 % (ref 11.2–14.5)
WBC: 3.8 10*3/uL — ABNORMAL LOW (ref 3.9–10.3)

## 2018-01-22 LAB — COMPREHENSIVE METABOLIC PANEL
ALT: 15 U/L (ref 0–44)
AST: 18 U/L (ref 15–41)
Albumin: 3.9 g/dL (ref 3.5–5.0)
Alkaline Phosphatase: 82 U/L (ref 38–126)
Anion gap: 11 (ref 5–15)
BUN: 20 mg/dL (ref 8–23)
CO2: 25 mmol/L (ref 22–32)
Calcium: 9.5 mg/dL (ref 8.9–10.3)
Chloride: 105 mmol/L (ref 98–111)
Creatinine, Ser: 1.09 mg/dL — ABNORMAL HIGH (ref 0.44–1.00)
GFR calc Af Amer: 59 mL/min — ABNORMAL LOW (ref >60)
GFR calc non Af Amer: 51 mL/min — ABNORMAL LOW (ref >60)
Glucose, Bld: 94 mg/dL (ref 70–99)
Potassium: 4 mmol/L (ref 3.5–5.1)
Sodium: 141 mmol/L (ref 135–145)
Total Bilirubin: 0.2 mg/dL — ABNORMAL LOW (ref 0.3–1.2)
Total Protein: 7.4 g/dL (ref 6.5–8.1)

## 2018-01-22 MED ORDER — HEPARIN SOD (PORK) LOCK FLUSH 100 UNIT/ML IV SOLN
500.0000 [IU] | Freq: Once | INTRAVENOUS | Status: AC | PRN
  Administered 2018-01-22: 500 [IU]
  Filled 2018-01-22: qty 5

## 2018-01-22 MED ORDER — ACETAMINOPHEN 325 MG PO TABS
650.0000 mg | ORAL_TABLET | Freq: Once | ORAL | Status: AC
  Administered 2018-01-22: 650 mg via ORAL

## 2018-01-22 MED ORDER — SODIUM CHLORIDE 0.9 % IV SOLN
Freq: Once | INTRAVENOUS | Status: AC
  Administered 2018-01-22: 12:00:00 via INTRAVENOUS
  Filled 2018-01-22: qty 250

## 2018-01-22 MED ORDER — SODIUM CHLORIDE 0.9% FLUSH
10.0000 mL | INTRAVENOUS | Status: DC | PRN
  Administered 2018-01-22: 10 mL
  Filled 2018-01-22: qty 10

## 2018-01-22 MED ORDER — TRASTUZUMAB CHEMO 150 MG IV SOLR
450.0000 mg | Freq: Once | INTRAVENOUS | Status: AC
  Administered 2018-01-22: 450 mg via INTRAVENOUS
  Filled 2018-01-22: qty 21.43

## 2018-01-22 MED ORDER — ACETAMINOPHEN 325 MG PO TABS
ORAL_TABLET | ORAL | Status: AC
  Filled 2018-01-22: qty 2

## 2018-01-22 MED ORDER — DIPHENHYDRAMINE HCL 25 MG PO CAPS
25.0000 mg | ORAL_CAPSULE | Freq: Once | ORAL | Status: AC
  Administered 2018-01-22: 25 mg via ORAL

## 2018-01-22 MED ORDER — DIPHENHYDRAMINE HCL 25 MG PO CAPS
ORAL_CAPSULE | ORAL | Status: AC
  Filled 2018-01-22: qty 1

## 2018-01-22 NOTE — Patient Instructions (Signed)
[facility] Discharge Instructions for Patients Receiving Chemotherapy Today you received the following chemotherapy agents:  Herceptin To help prevent nausea and vomiting after your treatment, we encourage you to take your nausea medication as prescribed.   If you develop nausea and vomiting that is not controlled by your nausea medication, call the clinic.   BELOW ARE SYMPTOMS THAT SHOULD BE REPORTED IMMEDIATELY:  *FEVER GREATER THAN 100.5 F  *CHILLS WITH OR WITHOUT FEVER  NAUSEA AND VOMITING THAT IS NOT CONTROLLED WITH YOUR NAUSEA MEDICATION  *UNUSUAL SHORTNESS OF BREATH  *UNUSUAL BRUISING OR BLEEDING  TENDERNESS IN MOUTH AND THROAT WITH OR WITHOUT PRESENCE OF ULCERS  *URINARY PROBLEMS  *BOWEL PROBLEMS  UNUSUAL RASH Items with * indicate a potential emergency and should be followed up as soon as possible.  Feel free to call the clinic should you have any questions or concerns. The clinic phone number is [phone].  Please show the CHEMO ALERT CARD at check-in to the Emergency Department and triage nurse.   

## 2018-01-22 NOTE — Patient Instructions (Signed)
Implanted Port Home Guide An implanted port is a type of central line that is placed under the skin. Central lines are used to provide IV access when treatment or nutrition needs to be given through a person's veins. Implanted ports are used for long-term IV access. An implanted port may be placed because:  You need IV medicine that would be irritating to the small veins in your hands or arms.  You need long-term IV medicines, such as antibiotics.  You need IV nutrition for a long period.  You need frequent blood draws for lab tests.  You need dialysis.  Implanted ports are usually placed in the chest area, but they can also be placed in the upper arm, the abdomen, or the leg. An implanted port has two main parts:  Reservoir. The reservoir is round and will appear as a small, raised area under your skin. The reservoir is the part where a needle is inserted to give medicines or draw blood.  Catheter. The catheter is a thin, flexible tube that extends from the reservoir. The catheter is placed into a large vein. Medicine that is inserted into the reservoir goes into the catheter and then into the vein.  How will I care for my incision site? Do not get the incision site wet. Bathe or shower as directed by your health care provider. How is my port accessed? Special steps must be taken to access the port:  Before the port is accessed, a numbing cream can be placed on the skin. This helps numb the skin over the port site.  Your health care provider uses a sterile technique to access the port. ? Your health care provider must put on a mask and sterile gloves. ? The skin over your port is cleaned carefully with an antiseptic and allowed to dry. ? The port is gently pinched between sterile gloves, and a needle is inserted into the port.  Only "non-coring" port needles should be used to access the port. Once the port is accessed, a blood return should be checked. This helps ensure that the port  is in the vein and is not clogged.  If your port needs to remain accessed for a constant infusion, a clear (transparent) bandage will be placed over the needle site. The bandage and needle will need to be changed every week, or as directed by your health care provider.  Keep the bandage covering the needle clean and dry. Do not get it wet. Follow your health care provider's instructions on how to take a shower or bath while the port is accessed.  If your port does not need to stay accessed, no bandage is needed over the port.  What is flushing? Flushing helps keep the port from getting clogged. Follow your health care provider's instructions on how and when to flush the port. Ports are usually flushed with saline solution or a medicine called heparin. The need for flushing will depend on how the port is used.  If the port is used for intermittent medicines or blood draws, the port will need to be flushed: ? After medicines have been given. ? After blood has been drawn. ? As part of routine maintenance.  If a constant infusion is running, the port may not need to be flushed.  How long will my port stay implanted? The port can stay in for as long as your health care provider thinks it is needed. When it is time for the port to come out, surgery will be   done to remove it. The procedure is similar to the one performed when the port was put in. When should I seek immediate medical care? When you have an implanted port, you should seek immediate medical care if:  You notice a bad smell coming from the incision site.  You have swelling, redness, or drainage at the incision site.  You have more swelling or pain at the port site or the surrounding area.  You have a fever that is not controlled with medicine.  This information is not intended to replace advice given to you by your health care provider. Make sure you discuss any questions you have with your health care provider. Document  Released: 06/05/2005 Document Revised: 11/11/2015 Document Reviewed: 02/10/2013 [employer] Interactive Patient Education  2017 [employer]  

## 2018-02-05 ENCOUNTER — Ambulatory Visit (HOSPITAL_COMMUNITY)
Admission: RE | Admit: 2018-02-05 | Discharge: 2018-02-05 | Disposition: A | Discharge status: Home / Self Care | Payer: Medicare Other | Source: Ambulatory Visit | Attending: Family Medicine | Admitting: Family Medicine

## 2018-02-05 ENCOUNTER — Ambulatory Visit (HOSPITAL_BASED_OUTPATIENT_CLINIC_OR_DEPARTMENT_OTHER)
Admission: RE | Admit: 2018-02-05 | Discharge: 2018-02-05 | Disposition: A | Discharge status: Home / Self Care | Payer: Medicare Other | Source: Ambulatory Visit | Attending: Internal Medicine | Admitting: Internal Medicine

## 2018-02-05 VITALS — BP 130/68 | HR 79 | Wt 150.8 lb

## 2018-02-05 DIAGNOSIS — Z888 Allergy status to other drugs, medicaments and biological substances status: Secondary | ICD-10-CM | POA: Insufficient documentation

## 2018-02-05 DIAGNOSIS — Z7982 Long term (current) use of aspirin: Secondary | ICD-10-CM | POA: Insufficient documentation

## 2018-02-05 DIAGNOSIS — Z886 Allergy status to analgesic agent status: Secondary | ICD-10-CM | POA: Diagnosis not present

## 2018-02-05 DIAGNOSIS — I10 Essential (primary) hypertension: Secondary | ICD-10-CM | POA: Diagnosis not present

## 2018-02-05 DIAGNOSIS — C50911 Malignant neoplasm of unspecified site of right female breast: Secondary | ICD-10-CM | POA: Insufficient documentation

## 2018-02-05 DIAGNOSIS — Z79899 Other long term (current) drug therapy: Secondary | ICD-10-CM | POA: Diagnosis not present

## 2018-02-05 DIAGNOSIS — Z9221 Personal history of antineoplastic chemotherapy: Secondary | ICD-10-CM | POA: Diagnosis not present

## 2018-02-05 DIAGNOSIS — C50011 Malignant neoplasm of nipple and areola, right female breast: Secondary | ICD-10-CM | POA: Diagnosis not present

## 2018-02-05 DIAGNOSIS — Z882 Allergy status to sulfonamides status: Secondary | ICD-10-CM | POA: Insufficient documentation

## 2018-02-05 NOTE — Progress Notes (Signed)
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 Jana Hakim for enrollment into the Cardio-Oncology program for herceptin surveillance. .  Denies h/o of major cardiac problems. Was having palpitations in distant past and thought she had MVP. Followed with Dr. Melvern Banker who felt palpitations were stress-related. Echo ok.   In 1/19 diagnosed with clinical T1b N0, stage IA invasive ductal carcinoma, grade 3, estrogen and progesterone receptor negative, but HER-2 amplified, with an MIB-1 of 30% right breast CA.   Breast cancer history as below   (1) status post right lumpectomy and sentinel lymph node sampling 07/17/2017 for a pT1c pN0, stage IA invasive ductal carcinoma, grade 2, with negative margins.  A total of 5 lymph nodes were removed  (2) adjuvant chemotherapy will consist of paclitaxel weekly x12 together with trastuzumab every 21 days starting 08/07/2017             (a) paclitaxel stopped after 8 doses because of neuropathy (last dose 09/25/2017)  (3) trastuzumab will be continued to total 1 year             (a) echo 08/10/2017 showed an ejection fraction in the 65-70% range   (4) XRT started in 10/17/17  Admitted to [facility] in 2/19 with ? TIA. Was slurring her words. SBP > 200 at the time (says this was new diagnosis of HTN). Seen by Neurology and w/u negative including MRI. Was started on lisinopril and has been titrated up.   Returns to day for routine f/u. BPs have been much better controlled. Remains active. No CP, orthopnea, PND or edema. Toelrating Herceptin well. Will finish in January.    Echo today EF 60-65% grade I DD - GLS -19.2%, LS 7.9 cm/s. Personally reviewed  Echo 2/19 60-65% GLS -20.9% Bubble study negative  Past Medical History:  Diagnosis Date  . Breast cancer ([place]) 05/2017   right breast  . Eczema   . Family history of  breast cancer   . Family history of ovarian cancer   . History of radiation therapy 10/17/17- 11/14/17   40.05 directed to the right breast in 15 fractions, followed by a boost of 10 gy given in 5 fractions.   . Hypertension    toxemia during pregnancy, no meds now    Current Outpatient Medications  Medication Sig Dispense Refill  . aspirin 81 MG chewable tablet Chew 81 mg by mouth daily.    Marland Kitchen CALCIUM-VITAMIN D PO Take 1 tablet by mouth daily.    . Coenzyme Q10 (COQ10 PO) Take by mouth daily.    . ergocalciferol (VITAMIN D2) 50000 units capsule Take 50,000 Units by mouth once a week.    . hydrochlorothiazide (MICROZIDE) 12.5 MG capsule Take 1 capsule (12.5 mg total) by mouth daily. 90 capsule 3  . lidocaine-prilocaine (EMLA) cream Apply 1 application topically as needed. 30 g 0  . lisinopril (PRINIVIL,ZESTRIL) 20 MG tablet Take 20 mg by mouth daily.    . pravastatin (PRAVACHOL) 80 MG tablet Take 1 tablet (80 mg total) by mouth daily at 6 PM. 60 tablet 0  . vitamin B-12 (CYANOCOBALAMIN) 1000 MCG tablet Take 1,000 mcg by mouth daily.     No current facility-administered medications for this encounter.     Allergies  Allergen Reactions  . Tape Itching and Rash  . Nsaids   . Antihistamines, Diphenhydramine-Type Other (See Comments)  Pt stated she gets lump in throat and stomach when taking histamines.   . Sulfa Antibiotics Rash      Social History   Socioeconomic History  . Marital status: Married    Spouse name: Not on file  . Number of children: Not on file  . Years of education: Not on file  . Highest education level: Not on file  Occupational History  . Not on file  Social Needs  . Financial resource strain: Not on file  . Food insecurity:    Worry: Not on file    Inability: Not on file  . Transportation needs:    Medical: Not on file    Non-medical: Not on file  Tobacco Use  . Smoking status: Never Smoker  . Smokeless tobacco: Never Used  Substance and Sexual  Activity  . Alcohol use: Yes    Comment: social  . Drug use: No  . Sexual activity: Not Currently  Lifestyle  . Physical activity:    Days per week: Not on file    Minutes per session: Not on file  . Stress: Not on file  Relationships  . Social connections:    Talks on phone: Not on file    Gets together: Not on file    Attends religious service: Not on file    Active member of club or organization: Not on file    Attends meetings of clubs or organizations: Not on file    Relationship status: Not on file  . Intimate partner violence:    Fear of current or ex partner: Not on file    Emotionally abused: Not on file    Physically abused: Not on file    Forced sexual activity: Not on file  Other Topics Concern  . Not on file  Social History Narrative  . Not on file      Family History  Problem Relation Age of Onset  . Breast cancer Mother 13       again at 67 in other breast   . Heart attack Father 43  . Breast cancer Sister 77  . Breast cancer Maternal Grandmother 79       spread to lungs, died at 60  . Ovarian cancer Cousin 37  . Prostate cancer Cousin     Vitals:   02/05/18 1158  BP: 130/68  Pulse: 79  SpO2: 100%  Weight: 68.4 kg (150 lb 12.8 oz)    PHYSICAL EXAM: General:  Well appearing. No resp difficulty HEENT: normal Neck: supple. no JVD. Carotids 2+ bilat; no bruits. No lymphadenopathy or thryomegaly appreciated. Cor: PMI nondisplaced. Regular rate & rhythm. No rubs, gallops or murmurs. Right porta cath Lungs: clear Abdomen: soft, nontender, nondistended. No hepatosplenomegaly. No bruits or masses. Good bowel sounds. Extremities: no cyanosis, clubbing, rash, edema Neuro: alert & orientedx3, cranial nerves grossly intact. moves all 4 extremities w/o difficulty. Affect pleasant    ASSESSMENT & PLAN: 1. Right Breast Cancer - diagnosed 1/19 - clinical T1b N0, stage IA invasive ductal carcinoma, grade 3, estrogen and progesterone receptor negative, but  HER-2 amplified, with an MIB-1 of 30%  -I reviewed echos personally. EF and Doppler parameters stable. No HF on exam. Continue Herceptin.   2. HTN  - Blood pressure well controlled. Continue current regimen. - K 4.0 on 01/22/18  Glori Bickers, MD  12:27 PM

## 2018-02-05 NOTE — Patient Instructions (Signed)
Your physician recommends that you schedule a follow-up appointment in: December with echocardiogram

## 2018-02-05 NOTE — Addendum Note (Signed)
Encounter addended by: Scarlette Calico, RN on: 02/05/2018 12:36 PM  Actions taken: Order list changed, Diagnosis association updated

## 2018-02-05 NOTE — Addendum Note (Signed)
Encounter addended by: Scarlette Calico, RN on: 02/05/2018 12:33 PM  Actions taken: Sign clinical note

## 2018-02-12 ENCOUNTER — Inpatient Hospital Stay: Payer: Medicare Other

## 2018-02-12 DIAGNOSIS — Z5112 Encounter for antineoplastic immunotherapy: Secondary | ICD-10-CM | POA: Diagnosis not present

## 2018-02-12 DIAGNOSIS — C50411 Malignant neoplasm of upper-outer quadrant of right female breast: Secondary | ICD-10-CM

## 2018-02-12 DIAGNOSIS — Z171 Estrogen receptor negative status [ER-]: Principal | ICD-10-CM

## 2018-02-12 LAB — CBC WITH DIFFERENTIAL (CANCER CENTER ONLY)
Basophils Absolute: 0 10*3/uL (ref 0.0–0.1)
Basophils Relative: 1 %
Eosinophils Absolute: 0.1 10*3/uL (ref 0.0–0.5)
Eosinophils Relative: 3 %
HCT: 32.4 % — ABNORMAL LOW (ref 34.8–46.6)
Hemoglobin: 10.7 g/dL — ABNORMAL LOW (ref 11.6–15.9)
Lymphocytes Relative: 35 %
Lymphs Abs: 1.3 10*3/uL (ref 0.9–3.3)
MCH: 28.1 pg (ref 25.1–34.0)
MCHC: 32.9 g/dL (ref 31.5–36.0)
MCV: 85.3 fL (ref 79.5–101.0)
Monocytes Absolute: 0.2 10*3/uL (ref 0.1–0.9)
Monocytes Relative: 4 %
Neutro Abs: 2.1 10*3/uL (ref 1.5–6.5)
Neutrophils Relative %: 57 %
Platelet Count: 294 10*3/uL (ref 145–400)
RBC: 3.8 MIL/uL (ref 3.70–5.45)
RDW: 14 % (ref 11.2–14.5)
WBC Count: 3.6 10*3/uL — ABNORMAL LOW (ref 3.9–10.3)

## 2018-02-12 LAB — COMPREHENSIVE METABOLIC PANEL
ALT: 16 U/L (ref 0–44)
AST: 19 U/L (ref 15–41)
Albumin: 4 g/dL (ref 3.5–5.0)
Alkaline Phosphatase: 77 U/L (ref 38–126)
Anion gap: 9 (ref 5–15)
BUN: 10 mg/dL (ref 8–23)
CO2: 29 mmol/L (ref 22–32)
Calcium: 9.7 mg/dL (ref 8.9–10.3)
Chloride: 103 mmol/L (ref 98–111)
Creatinine, Ser: 1.01 mg/dL — ABNORMAL HIGH (ref 0.44–1.00)
GFR calc Af Amer: >60 mL/min (ref >60)
GFR calc non Af Amer: 56 mL/min — ABNORMAL LOW (ref >60)
Glucose, Bld: 123 mg/dL — ABNORMAL HIGH (ref 70–99)
Potassium: 3.8 mmol/L (ref 3.5–5.1)
Sodium: 141 mmol/L (ref 135–145)
Total Bilirubin: 0.2 mg/dL — ABNORMAL LOW (ref 0.3–1.2)
Total Protein: 7.6 g/dL (ref 6.5–8.1)

## 2018-02-12 MED ORDER — ACETAMINOPHEN 325 MG PO TABS
650.0000 mg | ORAL_TABLET | Freq: Once | ORAL | Status: AC
  Administered 2018-02-12: 650 mg via ORAL

## 2018-02-12 MED ORDER — TRASTUZUMAB CHEMO 150 MG IV SOLR
4.0000 mg/kg | Freq: Once | INTRAVENOUS | Status: AC
  Administered 2018-02-12: 273 mg via INTRAVENOUS
  Filled 2018-02-12: qty 13

## 2018-02-12 MED ORDER — ACETAMINOPHEN 325 MG PO TABS
ORAL_TABLET | ORAL | Status: AC
  Filled 2018-02-12: qty 2

## 2018-02-12 MED ORDER — SODIUM CHLORIDE 0.9 % IV SOLN
Freq: Once | INTRAVENOUS | Status: AC
  Administered 2018-02-12: 13:00:00 via INTRAVENOUS
  Filled 2018-02-12: qty 250

## 2018-02-12 MED ORDER — DIPHENHYDRAMINE HCL 25 MG PO CAPS
ORAL_CAPSULE | ORAL | Status: AC
  Filled 2018-02-12: qty 1

## 2018-02-12 MED ORDER — DIPHENHYDRAMINE HCL 25 MG PO CAPS
25.0000 mg | ORAL_CAPSULE | Freq: Once | ORAL | Status: AC
  Administered 2018-02-12: 25 mg via ORAL

## 2018-02-12 MED ORDER — HEPARIN SOD (PORK) LOCK FLUSH 100 UNIT/ML IV SOLN
500.0000 [IU] | Freq: Once | INTRAVENOUS | Status: AC | PRN
  Administered 2018-02-12: 500 [IU]
  Filled 2018-02-12: qty 5

## 2018-02-12 MED ORDER — SODIUM CHLORIDE 0.9% FLUSH
10.0000 mL | INTRAVENOUS | Status: DC | PRN
  Administered 2018-02-12: 10 mL
  Filled 2018-02-12: qty 10

## 2018-02-12 NOTE — Patient Instructions (Signed)
[facility] Discharge Instructions for Patients Receiving Chemotherapy Today you received the following chemotherapy agents:  Herceptin To help prevent nausea and vomiting after your treatment, we encourage you to take your nausea medication as prescribed.   If you develop nausea and vomiting that is not controlled by your nausea medication, call the clinic.   BELOW ARE SYMPTOMS THAT SHOULD BE REPORTED IMMEDIATELY:  *FEVER GREATER THAN 100.5 F  *CHILLS WITH OR WITHOUT FEVER  NAUSEA AND VOMITING THAT IS NOT CONTROLLED WITH YOUR NAUSEA MEDICATION  *UNUSUAL SHORTNESS OF BREATH  *UNUSUAL BRUISING OR BLEEDING  TENDERNESS IN MOUTH AND THROAT WITH OR WITHOUT PRESENCE OF ULCERS  *URINARY PROBLEMS  *BOWEL PROBLEMS  UNUSUAL RASH Items with * indicate a potential emergency and should be followed up as soon as possible.  Feel free to call the clinic should you have any questions or concerns. The clinic phone number is [phone].  Please show the CHEMO ALERT CARD at check-in to the Emergency Department and triage nurse.   

## 2018-02-26 ENCOUNTER — Inpatient Hospital Stay: Payer: Medicare Other

## 2018-02-26 ENCOUNTER — Inpatient Hospital Stay: Payer: Medicare Other | Attending: Oncology

## 2018-02-26 VITALS — BP 131/66 | HR 61 | Temp 98.2°F | Resp 18

## 2018-02-26 DIAGNOSIS — Z95828 Presence of other vascular implants and grafts: Secondary | ICD-10-CM

## 2018-02-26 DIAGNOSIS — Z5112 Encounter for antineoplastic immunotherapy: Secondary | ICD-10-CM | POA: Diagnosis not present

## 2018-02-26 DIAGNOSIS — Z171 Estrogen receptor negative status [ER-]: Secondary | ICD-10-CM

## 2018-02-26 DIAGNOSIS — C50411 Malignant neoplasm of upper-outer quadrant of right female breast: Secondary | ICD-10-CM | POA: Insufficient documentation

## 2018-02-26 LAB — COMPREHENSIVE METABOLIC PANEL
ALT: 17 U/L (ref 0–44)
AST: 19 U/L (ref 15–41)
Albumin: 4.2 g/dL (ref 3.5–5.0)
Alkaline Phosphatase: 73 U/L (ref 38–126)
Anion gap: 11 (ref 5–15)
BUN: 13 mg/dL (ref 8–23)
CO2: 25 mmol/L (ref 22–32)
Calcium: 10 mg/dL (ref 8.9–10.3)
Chloride: 104 mmol/L (ref 98–111)
Creatinine, Ser: 0.99 mg/dL (ref 0.44–1.00)
GFR calc Af Amer: >60 mL/min (ref >60)
GFR calc non Af Amer: 57 mL/min — ABNORMAL LOW (ref >60)
Glucose, Bld: 98 mg/dL (ref 70–99)
Potassium: 3.9 mmol/L (ref 3.5–5.1)
Sodium: 140 mmol/L (ref 135–145)
Total Bilirubin: 0.3 mg/dL (ref 0.3–1.2)
Total Protein: 7.5 g/dL (ref 6.5–8.1)

## 2018-02-26 LAB — CBC WITH DIFFERENTIAL/PLATELET
Basophils Absolute: 0 10*3/uL (ref 0.0–0.1)
Basophils Relative: 1 %
Eosinophils Absolute: 0.1 10*3/uL (ref 0.0–0.5)
Eosinophils Relative: 4 %
HCT: 30.5 % — ABNORMAL LOW (ref 34.8–46.6)
Hemoglobin: 10.1 g/dL — ABNORMAL LOW (ref 11.6–15.9)
Lymphocytes Relative: 39 %
Lymphs Abs: 1.4 10*3/uL (ref 0.9–3.3)
MCH: 28.5 pg (ref 25.1–34.0)
MCHC: 33.2 g/dL (ref 31.5–36.0)
MCV: 85.9 fL (ref 79.5–101.0)
Monocytes Absolute: 0.4 10*3/uL (ref 0.1–0.9)
Monocytes Relative: 12 %
Neutro Abs: 1.5 10*3/uL (ref 1.5–6.5)
Neutrophils Relative %: 44 %
Platelets: 242 10*3/uL (ref 145–400)
RBC: 3.55 MIL/uL — ABNORMAL LOW (ref 3.70–5.45)
RDW: 14 % (ref 11.2–14.5)
WBC: 3.5 10*3/uL — ABNORMAL LOW (ref 3.9–10.3)

## 2018-02-26 MED ORDER — DIPHENHYDRAMINE HCL 25 MG PO CAPS
25.0000 mg | ORAL_CAPSULE | Freq: Once | ORAL | Status: AC
  Administered 2018-02-26: 25 mg via ORAL

## 2018-02-26 MED ORDER — SODIUM CHLORIDE 0.9% FLUSH
10.0000 mL | INTRAVENOUS | Status: DC | PRN
  Administered 2018-02-26: 10 mL
  Filled 2018-02-26: qty 10

## 2018-02-26 MED ORDER — HEPARIN SOD (PORK) LOCK FLUSH 100 UNIT/ML IV SOLN
500.0000 [IU] | Freq: Once | INTRAVENOUS | Status: AC | PRN
  Administered 2018-02-26: 500 [IU]
  Filled 2018-02-26: qty 5

## 2018-02-26 MED ORDER — TRASTUZUMAB CHEMO 150 MG IV SOLR
450.0000 mg | Freq: Once | INTRAVENOUS | Status: AC
  Administered 2018-02-26: 450 mg via INTRAVENOUS
  Filled 2018-02-26: qty 21.43

## 2018-02-26 MED ORDER — ACETAMINOPHEN 325 MG PO TABS
650.0000 mg | ORAL_TABLET | Freq: Once | ORAL | Status: AC
  Administered 2018-02-26: 650 mg via ORAL

## 2018-02-26 MED ORDER — ACETAMINOPHEN 325 MG PO TABS
ORAL_TABLET | ORAL | Status: AC
  Filled 2018-02-26: qty 2

## 2018-02-26 MED ORDER — SODIUM CHLORIDE 0.9 % IV SOLN
Freq: Once | INTRAVENOUS | Status: AC
  Administered 2018-02-26: 10:00:00 via INTRAVENOUS
  Filled 2018-02-26: qty 250

## 2018-02-26 MED ORDER — DIPHENHYDRAMINE HCL 25 MG PO CAPS
ORAL_CAPSULE | ORAL | Status: AC
  Filled 2018-02-26: qty 1

## 2018-02-26 NOTE — Patient Instructions (Signed)
[facility] Discharge Instructions for Patients Receiving Chemotherapy  Today you received the following chemotherapy agents Herceptin  To help prevent nausea and vomiting after your treatment, we encourage you to take your nausea medication as directed   If you develop nausea and vomiting that is not controlled by your nausea medication, call the clinic.   BELOW ARE SYMPTOMS THAT SHOULD BE REPORTED IMMEDIATELY:  *FEVER GREATER THAN 100.5 F  *CHILLS WITH OR WITHOUT FEVER  NAUSEA AND VOMITING THAT IS NOT CONTROLLED WITH YOUR NAUSEA MEDICATION  *UNUSUAL SHORTNESS OF BREATH  *UNUSUAL BRUISING OR BLEEDING  TENDERNESS IN MOUTH AND THROAT WITH OR WITHOUT PRESENCE OF ULCERS  *URINARY PROBLEMS  *BOWEL PROBLEMS  UNUSUAL RASH Items with * indicate a potential emergency and should be followed up as soon as possible.  Feel free to call the clinic should you have any questions or concerns. The clinic phone number is [phone].  Please show the CHEMO ALERT CARD at check-in to the Emergency Department and triage nurse.   

## 2018-03-05 ENCOUNTER — Other Ambulatory Visit: Payer: Medicare Other

## 2018-03-05 ENCOUNTER — Ambulatory Visit: Payer: Medicare Other

## 2018-03-19 ENCOUNTER — Inpatient Hospital Stay: Payer: Medicare Other

## 2018-03-19 ENCOUNTER — Inpatient Hospital Stay: Payer: Medicare Other | Attending: Oncology

## 2018-03-19 VITALS — BP 146/75 | HR 68 | Temp 98.2°F | Resp 18

## 2018-03-19 DIAGNOSIS — C50411 Malignant neoplasm of upper-outer quadrant of right female breast: Secondary | ICD-10-CM

## 2018-03-19 DIAGNOSIS — Z171 Estrogen receptor negative status [ER-]: Secondary | ICD-10-CM

## 2018-03-19 DIAGNOSIS — Z95828 Presence of other vascular implants and grafts: Secondary | ICD-10-CM

## 2018-03-19 DIAGNOSIS — Z5112 Encounter for antineoplastic immunotherapy: Secondary | ICD-10-CM | POA: Insufficient documentation

## 2018-03-19 LAB — CBC WITH DIFFERENTIAL/PLATELET
Basophils Absolute: 0 10*3/uL (ref 0.0–0.1)
Basophils Relative: 1 %
Eosinophils Absolute: 0.1 10*3/uL (ref 0.0–0.5)
Eosinophils Relative: 3 %
HCT: 32.7 % — ABNORMAL LOW (ref 34.8–46.6)
Hemoglobin: 10.8 g/dL — ABNORMAL LOW (ref 11.6–15.9)
Lymphocytes Relative: 38 %
Lymphs Abs: 1.4 10*3/uL (ref 0.9–3.3)
MCH: 28.8 pg (ref 25.1–34.0)
MCHC: 33 g/dL (ref 31.5–36.0)
MCV: 87.5 fL (ref 79.5–101.0)
Monocytes Absolute: 0.4 10*3/uL (ref 0.1–0.9)
Monocytes Relative: 11 %
Neutro Abs: 1.8 10*3/uL (ref 1.5–6.5)
Neutrophils Relative %: 47 %
Platelets: 305 10*3/uL (ref 145–400)
RBC: 3.74 MIL/uL (ref 3.70–5.45)
RDW: 14.3 % (ref 11.2–14.5)
WBC: 3.7 10*3/uL — ABNORMAL LOW (ref 3.9–10.3)

## 2018-03-19 LAB — COMPREHENSIVE METABOLIC PANEL
ALT: 17 U/L (ref 0–44)
AST: 20 U/L (ref 15–41)
Albumin: 4.1 g/dL (ref 3.5–5.0)
Alkaline Phosphatase: 76 U/L (ref 38–126)
Anion gap: 8 (ref 5–15)
BUN: 11 mg/dL (ref 8–23)
CO2: 29 mmol/L (ref 22–32)
Calcium: 10.1 mg/dL (ref 8.9–10.3)
Chloride: 102 mmol/L (ref 98–111)
Creatinine, Ser: 1 mg/dL (ref 0.44–1.00)
GFR calc Af Amer: >60 mL/min (ref >60)
GFR calc non Af Amer: 57 mL/min — ABNORMAL LOW (ref >60)
Glucose, Bld: 92 mg/dL (ref 70–99)
Potassium: 4.1 mmol/L (ref 3.5–5.1)
Sodium: 139 mmol/L (ref 135–145)
Total Bilirubin: 0.4 mg/dL (ref 0.3–1.2)
Total Protein: 7.8 g/dL (ref 6.5–8.1)

## 2018-03-19 MED ORDER — SODIUM CHLORIDE 0.9% FLUSH
10.0000 mL | INTRAVENOUS | Status: DC | PRN
  Administered 2018-03-19: 10 mL
  Filled 2018-03-19: qty 10

## 2018-03-19 MED ORDER — DIPHENHYDRAMINE HCL 25 MG PO CAPS
25.0000 mg | ORAL_CAPSULE | Freq: Once | ORAL | Status: AC
  Administered 2018-03-19: 25 mg via ORAL

## 2018-03-19 MED ORDER — ACETAMINOPHEN 325 MG PO TABS
ORAL_TABLET | ORAL | Status: AC
  Filled 2018-03-19: qty 2

## 2018-03-19 MED ORDER — SODIUM CHLORIDE 0.9 % IV SOLN
Freq: Once | INTRAVENOUS | Status: AC
  Administered 2018-03-19: 11:00:00 via INTRAVENOUS
  Filled 2018-03-19: qty 250

## 2018-03-19 MED ORDER — TRASTUZUMAB CHEMO 150 MG IV SOLR
450.0000 mg | Freq: Once | INTRAVENOUS | Status: AC
  Administered 2018-03-19: 450 mg via INTRAVENOUS
  Filled 2018-03-19: qty 21.43

## 2018-03-19 MED ORDER — DIPHENHYDRAMINE HCL 25 MG PO CAPS
ORAL_CAPSULE | ORAL | Status: AC
  Filled 2018-03-19: qty 1

## 2018-03-19 MED ORDER — ACETAMINOPHEN 325 MG PO TABS
650.0000 mg | ORAL_TABLET | Freq: Once | ORAL | Status: AC
  Administered 2018-03-19: 650 mg via ORAL

## 2018-03-19 MED ORDER — HEPARIN SOD (PORK) LOCK FLUSH 100 UNIT/ML IV SOLN
500.0000 [IU] | Freq: Once | INTRAVENOUS | Status: AC | PRN
  Administered 2018-03-19: 500 [IU]
  Filled 2018-03-19: qty 5

## 2018-03-19 NOTE — Patient Instructions (Signed)
[facility] Discharge Instructions for Patients Receiving Chemotherapy  Today you received the following chemotherapy agents Herceptin  To help prevent nausea and vomiting after your treatment, we encourage you to take your nausea medication as directed   If you develop nausea and vomiting that is not controlled by your nausea medication, call the clinic.   BELOW ARE SYMPTOMS THAT SHOULD BE REPORTED IMMEDIATELY:  *FEVER GREATER THAN 100.5 F  *CHILLS WITH OR WITHOUT FEVER  NAUSEA AND VOMITING THAT IS NOT CONTROLLED WITH YOUR NAUSEA MEDICATION  *UNUSUAL SHORTNESS OF BREATH  *UNUSUAL BRUISING OR BLEEDING  TENDERNESS IN MOUTH AND THROAT WITH OR WITHOUT PRESENCE OF ULCERS  *URINARY PROBLEMS  *BOWEL PROBLEMS  UNUSUAL RASH Items with * indicate a potential emergency and should be followed up as soon as possible.  Feel free to call the clinic should you have any questions or concerns. The clinic phone number is [phone].  Please show the CHEMO ALERT CARD at check-in to the Emergency Department and triage nurse.   

## 2018-03-26 ENCOUNTER — Ambulatory Visit: Payer: Medicare Other

## 2018-03-26 ENCOUNTER — Other Ambulatory Visit: Payer: Medicare Other

## 2018-04-09 ENCOUNTER — Inpatient Hospital Stay: Payer: Medicare Other

## 2018-04-09 VITALS — BP 172/71 | HR 77 | Temp 98.0°F | Resp 17 | Wt 152.4 lb

## 2018-04-09 DIAGNOSIS — Z171 Estrogen receptor negative status [ER-]: Principal | ICD-10-CM

## 2018-04-09 DIAGNOSIS — Z5112 Encounter for antineoplastic immunotherapy: Secondary | ICD-10-CM | POA: Diagnosis not present

## 2018-04-09 DIAGNOSIS — C50411 Malignant neoplasm of upper-outer quadrant of right female breast: Secondary | ICD-10-CM

## 2018-04-09 LAB — COMPREHENSIVE METABOLIC PANEL
ALT: 16 U/L (ref 0–44)
AST: 20 U/L (ref 15–41)
Albumin: 3.8 g/dL (ref 3.5–5.0)
Alkaline Phosphatase: 79 U/L (ref 38–126)
Anion gap: 11 (ref 5–15)
BUN: 11 mg/dL (ref 8–23)
CO2: 26 mmol/L (ref 22–32)
Calcium: 9.7 mg/dL (ref 8.9–10.3)
Chloride: 105 mmol/L (ref 98–111)
Creatinine, Ser: 0.97 mg/dL (ref 0.44–1.00)
GFR calc Af Amer: >60 mL/min (ref >60)
GFR calc non Af Amer: 59 mL/min — ABNORMAL LOW (ref >60)
Glucose, Bld: 98 mg/dL (ref 70–99)
Potassium: 4 mmol/L (ref 3.5–5.1)
Sodium: 142 mmol/L (ref 135–145)
Total Bilirubin: 0.4 mg/dL (ref 0.3–1.2)
Total Protein: 7.2 g/dL (ref 6.5–8.1)

## 2018-04-09 LAB — CBC WITH DIFFERENTIAL/PLATELET
Abs Immature Granulocytes: 0 10*3/uL (ref 0.00–0.07)
Basophils Absolute: 0 10*3/uL (ref 0.0–0.1)
Basophils Relative: 1 %
Eosinophils Absolute: 0.1 10*3/uL (ref 0.0–0.5)
Eosinophils Relative: 3 %
HCT: 31.8 % — ABNORMAL LOW (ref 36.0–46.0)
Hemoglobin: 10.1 g/dL — ABNORMAL LOW (ref 12.0–15.0)
Immature Granulocytes: 0 %
Lymphocytes Relative: 42 %
Lymphs Abs: 1.5 10*3/uL (ref 0.7–4.0)
MCH: 28.5 pg (ref 26.0–34.0)
MCHC: 31.8 g/dL (ref 30.0–36.0)
MCV: 89.8 fL (ref 80.0–100.0)
Monocytes Absolute: 0.4 10*3/uL (ref 0.1–1.0)
Monocytes Relative: 12 %
Neutro Abs: 1.5 10*3/uL — ABNORMAL LOW (ref 1.7–7.7)
Neutrophils Relative %: 42 %
Platelets: 252 10*3/uL (ref 150–400)
RBC: 3.54 MIL/uL — ABNORMAL LOW (ref 3.87–5.11)
RDW: 13.1 % (ref 11.5–15.5)
WBC: 3.5 10*3/uL — ABNORMAL LOW (ref 4.0–10.5)
nRBC: 0 % (ref 0.0–0.2)

## 2018-04-09 MED ORDER — DIPHENHYDRAMINE HCL 25 MG PO CAPS
ORAL_CAPSULE | ORAL | Status: AC
  Filled 2018-04-09: qty 1

## 2018-04-09 MED ORDER — ACETAMINOPHEN 325 MG PO TABS
ORAL_TABLET | ORAL | Status: AC
  Filled 2018-04-09: qty 2

## 2018-04-09 MED ORDER — SODIUM CHLORIDE 0.9% FLUSH
10.0000 mL | INTRAVENOUS | Status: DC | PRN
  Administered 2018-04-09: 10 mL
  Filled 2018-04-09: qty 10

## 2018-04-09 MED ORDER — SODIUM CHLORIDE 0.9 % IV SOLN
Freq: Once | INTRAVENOUS | Status: AC
  Administered 2018-04-09: 10:00:00 via INTRAVENOUS
  Filled 2018-04-09: qty 250

## 2018-04-09 MED ORDER — TRASTUZUMAB CHEMO 150 MG IV SOLR
450.0000 mg | Freq: Once | INTRAVENOUS | Status: AC
  Administered 2018-04-09: 450 mg via INTRAVENOUS
  Filled 2018-04-09: qty 21.43

## 2018-04-09 MED ORDER — DIPHENHYDRAMINE HCL 25 MG PO CAPS
25.0000 mg | ORAL_CAPSULE | Freq: Once | ORAL | Status: AC
  Administered 2018-04-09: 25 mg via ORAL

## 2018-04-09 MED ORDER — HEPARIN SOD (PORK) LOCK FLUSH 100 UNIT/ML IV SOLN
500.0000 [IU] | Freq: Once | INTRAVENOUS | Status: AC | PRN
  Administered 2018-04-09: 500 [IU]
  Filled 2018-04-09: qty 5

## 2018-04-09 MED ORDER — ACETAMINOPHEN 325 MG PO TABS
650.0000 mg | ORAL_TABLET | Freq: Once | ORAL | Status: AC
  Administered 2018-04-09: 650 mg via ORAL

## 2018-04-09 NOTE — Patient Instructions (Signed)
[facility] Discharge Instructions for Patients Receiving Chemotherapy  Today you received the following chemotherapy agents Herceptin  To help prevent nausea and vomiting after your treatment, we encourage you to take your nausea medication as directed   If you develop nausea and vomiting that is not controlled by your nausea medication, call the clinic.   BELOW ARE SYMPTOMS THAT SHOULD BE REPORTED IMMEDIATELY:  *FEVER GREATER THAN 100.5 F  *CHILLS WITH OR WITHOUT FEVER  NAUSEA AND VOMITING THAT IS NOT CONTROLLED WITH YOUR NAUSEA MEDICATION  *UNUSUAL SHORTNESS OF BREATH  *UNUSUAL BRUISING OR BLEEDING  TENDERNESS IN MOUTH AND THROAT WITH OR WITHOUT PRESENCE OF ULCERS  *URINARY PROBLEMS  *BOWEL PROBLEMS  UNUSUAL RASH Items with * indicate a potential emergency and should be followed up as soon as possible.  Feel free to call the clinic should you have any questions or concerns. The clinic phone number is [phone].  Please show the CHEMO ALERT CARD at check-in to the Emergency Department and triage nurse.   

## 2018-04-09 NOTE — Patient Instructions (Signed)
Implanted Port Home Guide An implanted port is a type of central line that is placed under the skin. Central lines are used to provide IV access when treatment or nutrition needs to be given through a person's veins. Implanted ports are used for long-term IV access. An implanted port may be placed because:  You need IV medicine that would be irritating to the small veins in your hands or arms.  You need long-term IV medicines, such as antibiotics.  You need IV nutrition for a long period.  You need frequent blood draws for lab tests.  You need dialysis.  Implanted ports are usually placed in the chest area, but they can also be placed in the upper arm, the abdomen, or the leg. An implanted port has two main parts:  Reservoir. The reservoir is round and will appear as a small, raised area under your skin. The reservoir is the part where a needle is inserted to give medicines or draw blood.  Catheter. The catheter is a thin, flexible tube that extends from the reservoir. The catheter is placed into a large vein. Medicine that is inserted into the reservoir goes into the catheter and then into the vein.  How will I care for my incision site? Do not get the incision site wet. Bathe or shower as directed by your health care provider. How is my port accessed? Special steps must be taken to access the port:  Before the port is accessed, a numbing cream can be placed on the skin. This helps numb the skin over the port site.  Your health care provider uses a sterile technique to access the port. ? Your health care provider must put on a mask and sterile gloves. ? The skin over your port is cleaned carefully with an antiseptic and allowed to dry. ? The port is gently pinched between sterile gloves, and a needle is inserted into the port.  Only "non-coring" port needles should be used to access the port. Once the port is accessed, a blood return should be checked. This helps ensure that the port  is in the vein and is not clogged.  If your port needs to remain accessed for a constant infusion, a clear (transparent) bandage will be placed over the needle site. The bandage and needle will need to be changed every week, or as directed by your health care provider.  Keep the bandage covering the needle clean and dry. Do not get it wet. Follow your health care provider's instructions on how to take a shower or bath while the port is accessed.  If your port does not need to stay accessed, no bandage is needed over the port.  What is flushing? Flushing helps keep the port from getting clogged. Follow your health care provider's instructions on how and when to flush the port. Ports are usually flushed with saline solution or a medicine called heparin. The need for flushing will depend on how the port is used.  If the port is used for intermittent medicines or blood draws, the port will need to be flushed: ? After medicines have been given. ? After blood has been drawn. ? As part of routine maintenance.  If a constant infusion is running, the port may not need to be flushed.  How long will my port stay implanted? The port can stay in for as long as your health care provider thinks it is needed. When it is time for the port to come out, surgery will be   done to remove it. The procedure is similar to the one performed when the port was put in. When should I seek immediate medical care? When you have an implanted port, you should seek immediate medical care if:  You notice a bad smell coming from the incision site.  You have swelling, redness, or drainage at the incision site.  You have more swelling or pain at the port site or the surrounding area.  You have a fever that is not controlled with medicine.  This information is not intended to replace advice given to you by your health care provider. Make sure you discuss any questions you have with your health care provider. Document  Released: 06/05/2005 Document Revised: 11/11/2015 Document Reviewed: 02/10/2013 [employer] Interactive Patient Education  2017 [employer]  

## 2018-04-10 NOTE — Progress Notes (Signed)
[facility] [address] [place]. [place] [address] [phone]       OFFICE FOLLOW-UP NOTE  [name] Date of Birth:  28-Sep-1950 Medical Record Number:  [id number]   Chief Complaint  Patient presents with  . Follow-up    TIA follow up room 9 pt alone pt in breast cancer treatment      HPI:  [name] is a .34 year  African american lady seen today in the office for follow-up of TIA in..Feb 2019.History obtained from   the patient and  review of electronic medical records. I have personally reviewed the available imaging films.April [name] is a 67 y.o. female with a history of grade 3 invasive ductal carcinoma, stage IA. She was in her normal state of health until tonight around 8:00 pm when she began slurring her words.  This is been coming and going since it began, sometimes being almost completely gone and sometimes being very prominent.  She has always had at least a mild left facial droop since it began, however. She was started chemotherapy with Paclitaxel and Trastuzumab on Tuesday. Patient was found to have elevated blood pressure 232/83 on admission LKW: 8:00 PM on 08/09/17 tpa given?: no, mild symptoms Premorbid modified rankin scale: 0.MRI scan of the brain was negative for acute infarct. MRA of the brain was unremarkable. Carotid ultrasound showed no significant extracranial stenosis. LDL cholesterol was elevated 160. Hemoglobin A1c was normal. Patient was started on aspirin and statin.  10/10/2017 visit PS: She states she's done well since discharge. Her blood pressure still remains in the 150-160 range despite being started on lisinopril 10 mg daily. She is tolerating Pravachol 80 mg well but does have muscle aches and pains which may have been related to her chemotherapy which finished a week ago. She does complain of tingling numbness in the fingertips to likely from small fiber neuropathy from her chemotherapy. She is tolerating  aspirin well without bleeding or bruising. She has had no recurrent stroke or TIA symptoms. He plans to see primary care physician in a few weeks and has been advised to keep log of her blood pressure recordings.  Interval history 04/11/2018: Patient is being seen today for six-month follow-up.  She states she continues to do well without recurring symptoms.  She continues to take aspirin without side effects of bleeding or bruising.  She continues to take pravastatin but has been experiencing possible myalgias which were present when she has tried Lipitor and Crestor in the past.  She does state after stopping Lipitor and Crestor in the past, her myalgias resolved.  She is currently taking pravastatin every other day to help relieve her myalgias and even though they have decreased some, she is still continues to experience myalgias.  Blood pressure today elevated at 176/80.  Patient states is always elevated when she goes to doctor's appointments but also does fluctuate at home.  She states she was recently in [place] visiting her mother and for approximately the past 3 weeks, she has been experiencing daily headaches but also has been noticing an increase at that time and her blood pressure.  The past couple days her blood pressures been satisfactory 127/64 and 111/77.  While she was having his lower blood pressures, she denies any headaches at that time.  She does plan on speaking to PCP regarding possible need of change in HTN management.  No further concerns at this time.  She does continue to receive chemo treatments for  breast cancer.  Denies new or worsening stroke/TIA symptoms.    ROS:   14 system review of systems is positive for headaches and all other systems negative  PMH:  Past Medical History:  Diagnosis Date  . Breast cancer ([place]) 05/2017   right breast  . Eczema   . Family history of breast cancer   . Family history of ovarian cancer   . History of radiation therapy 10/17/17- 11/14/17     40.05 directed to the right breast in 15 fractions, followed by a boost of 10 gy given in 5 fractions.   . Hypertension    toxemia during pregnancy, no meds now    Social History:  Social History   Socioeconomic History  . Marital status: Married    Spouse name: Not on file  . Number of children: Not on file  . Years of education: Not on file  . Highest education level: Not on file  Occupational History  . Not on file  Social Needs  . Financial resource strain: Not on file  . Food insecurity:    Worry: Not on file    Inability: Not on file  . Transportation needs:    Medical: Not on file    Non-medical: Not on file  Tobacco Use  . Smoking status: Never Smoker  . Smokeless tobacco: Never Used  Substance and Sexual Activity  . Alcohol use: Yes    Comment: social  . Drug use: No  . Sexual activity: Not Currently  Lifestyle  . Physical activity:    Days per week: Not on file    Minutes per session: Not on file  . Stress: Not on file  Relationships  . Social connections:    Talks on phone: Not on file    Gets together: Not on file    Attends religious service: Not on file    Active member of club or organization: Not on file    Attends meetings of clubs or organizations: Not on file    Relationship status: Not on file  . Intimate partner violence:    Fear of current or ex partner: Not on file    Emotionally abused: Not on file    Physically abused: Not on file    Forced sexual activity: Not on file  Other Topics Concern  . Not on file  Social History Narrative  . Not on file    Medications:   Current Outpatient Medications on File Prior to Visit  Medication Sig Dispense Refill  . aspirin 81 MG chewable tablet Chew 81 mg by mouth daily.    Marland Kitchen CALCIUM-VITAMIN D PO Take 1 tablet by mouth daily.    . Coenzyme Q10 (COQ10 PO) Take by mouth every other day.     . ergocalciferol (VITAMIN D2) 50000 units capsule Take 50,000 Units by mouth once a week.    .  hydrochlorothiazide (MICROZIDE) 12.5 MG capsule Take 1 capsule (12.5 mg total) by mouth daily. 90 capsule 3  . lidocaine-prilocaine (EMLA) cream Apply 1 application topically as needed. 30 g 0  . losartan (COZAAR) 100 MG tablet Take 100 mg by mouth daily.    . pravastatin (PRAVACHOL) 80 MG tablet Take 1 tablet (80 mg total) by mouth daily at 6 PM. (Patient taking differently: Take 80 mg by mouth every other day. ) 60 tablet 0  . vitamin B-12 (CYANOCOBALAMIN) 1000 MCG tablet Take 1,000 mcg by mouth daily.    . [DISCONTINUED] prochlorperazine (COMPAZINE) 10 MG tablet Take  1 tablet (10 mg total) by mouth every 6 (six) hours as needed (Nausea or vomiting). 30 tablet 1   No current facility-administered medications on file prior to visit.     Allergies:   Allergies  Allergen Reactions  . Tape Itching and Rash  . Nsaids   . Antihistamines, Diphenhydramine-Type Other (See Comments)    Pt stated she gets lump in throat and stomach when taking histamines.   . Sulfa Antibiotics Rash    Physical Exam General: well developed, well nourished pleasant middle-aged African-American lady, seated, in no evident distress Head: head normocephalic and atraumatic.  Neck: supple with no carotid or supraclavicular bruits Cardiovascular: regular rate and rhythm, no murmurs Musculoskeletal: no deformity Skin:  no rash/petichiae Vascular:  Normal pulses all extremities Vitals:   04/11/18 1108  BP: (!) 176/80  Pulse: 63   Neurologic Exam Mental Status: Awake and fully alert. Oriented to place and time. Recent and remote memory intact. Attention span, concentration and fund of knowledge appropriate. Mood and affect appropriate.  Cranial Nerves: Fundoscopic exam reveals sharp disc margins. Pupils equal, briskly reactive to light. Extraocular movements full without nystagmus. Visual fields full to confrontation. Hearing intact. Facial sensation intact. Face, tongue, palate moves normally and symmetrically.    Motor: Normal bulk and tone. Normal strength in all tested extremity muscles. Sensory.: intact to touch ,pinprick .position and vibratory sensation.  Coordination: Rapid alternating movements normal in all extremities. Finger-to-nose and heel-to-shin performed accurately bilaterally. Gait and Station: Arises from chair without difficulty. Stance is normal. Gait demonstrates normal stride length and balance . Able to heel, toe and tandem walk without difficulty.  Reflexes: 1+ and symmetric. Toes downgoing.    ASSESSMENT: 67 year old African-American lady with transient episodes of slurred speech and facial droop in the setting of hypertensive urgency possible TIAs due to small vessel disease. She also has hand paresthesias likely from small fiber neuropathy related to recent chemotherapy. Vascular risk factors of hypertension and hyperlipidemia.  Patient is being seen today for follow-up visit and continues to do well from a TIA standpoint without recurring of symptoms but has been having difficulties controlling blood pressures and possible statin myalgias.    PLAN: -Continue aspirin 81 mg daily  and pravastatin for secondary stroke prevention -Consider use of Repatha for HLD management due to statin intolerances.  Patient was questioning whether she can start fish oil for HLD management.  She does state that PCP has recently checked lipid panel and was satisfactory (unable to view panel) and it was recommended that if LDL was less than 70 she can trial fish oil and have PCP repeat lipid panel in 2 to 3 months but if LDL becomes elevated at that time, highly recommend starting Repatha.  Patient was provided with Repatha information -F/u with PCP regarding your HLD and HTN management with possible need of HTN therapy change  -Advised to continue to stay active and maintain a healthy diet -continue to monitor BP at home -Maintain strict control of hypertension with blood pressure goal below 130/90,  diabetes with hemoglobin A1c goal below 6.5% and cholesterol with LDL cholesterol (bad cholesterol) goal below 70 mg/dL. I also advised the patient to eat a healthy diet with plenty of whole grains, cereals, fruits and vegetables, exercise regularly and maintain ideal body weight.  Follow up as needed as patient stable from TIA standpoint who recommended to call with questions, concerns or need of future follow-up appointment.  Greater than 50% of time during this 25 minute visit  was spent on counseling,explanation of diagnosis TIA, paresthesias, planning of further management, discussion with patient and family and coordination of care  Venancio Poisson, [facility]  [facility] [address] [place], [place] [address]  Phone [phone] Fax [phone] Note: This document was prepared with digital dictation and possible smart phrase technology. Any transcriptional errors that result from this process are unintentional.

## 2018-04-11 ENCOUNTER — Ambulatory Visit: Payer: Medicare Other | Admitting: Adult Health

## 2018-04-11 ENCOUNTER — Encounter: Payer: Self-pay | Admitting: Adult Health

## 2018-04-11 VITALS — BP 176/80 | HR 63 | Ht 63.0 in | Wt 151.0 lb

## 2018-04-11 DIAGNOSIS — E785 Hyperlipidemia, unspecified: Secondary | ICD-10-CM

## 2018-04-11 DIAGNOSIS — G459 Transient cerebral ischemic attack, unspecified: Secondary | ICD-10-CM | POA: Diagnosis not present

## 2018-04-11 DIAGNOSIS — I10 Essential (primary) hypertension: Secondary | ICD-10-CM | POA: Diagnosis not present

## 2018-04-11 NOTE — Patient Instructions (Addendum)
Continue aspirin 81 mg daily  and pravastatin  for secondary stroke prevention  Consider start of Repatha due to continued statin intolerances  Continue to follow up with PCP regarding cholesterol and blood pressure management   Continue to monitor blood pressure at home  Stay active and maintain a healthy diet   Maintain strict control of hypertension with blood pressure goal below 130/90, diabetes with hemoglobin A1c goal below 6.5% and cholesterol with LDL cholesterol (bad cholesterol) goal below 70 mg/dL. I also advised the patient to eat a healthy diet with plenty of whole grains, cereals, fruits and vegetables, exercise regularly and maintain ideal body weight.  Followup in the future with me as needed or call earlier if needed       Thank you for coming to see [place] at [facility]. I hope we have been able to provide you high quality care today.  You may receive a patient satisfaction survey over the next few weeks. We would appreciate your feedback and comments so that we may continue to improve ourselves and the health of our patients.

## 2018-04-12 NOTE — Progress Notes (Signed)
I agree with the above plan 

## 2018-04-29 NOTE — Progress Notes (Signed)
[place]  [phone] [phone]) [phone]     ID: TRINITEE HORGAN DOB: 21-Sep-1950  MR#: [id number]  FYB#:[id number]  Patient Care Team: Kelton Pillar, MD as PCP - General (Family Medicine) Kelley Knoth, Virgie Dad, MD as Consulting Physician (Oncology) Rolm Bookbinder, MD as Consulting Physician (General Surgery) Eppie Gibson, MD as Attending Physician (Radiation Oncology) Newt Minion, MD as Consulting Physician (Orthopedic Surgery) Regal, Tamala Fothergill, DPM as Consulting Physician (Podiatry) Neldon Mc, Donnamarie Poag, MD as Consulting Physician (Allergy and Immunology) OTHER MD:  CHIEF COMPLAINT: Estrogen receptor negative breast cancer  CURRENT TREATMENT: trastuzumab   HISTORY OF CURRENT ILLNESS: From the original intake note:  "April [name]" had bilateral screening mammography at [facility] 06/06/2017.  This showed a possible mass in the right breast at the 11 o'clock position.  On 06/13/2017 she underwent right diagnostic mammography and ultrasonography.  Breast density was category C.  In the right breast at the 11 o'clock position there was a 1 cm area by mammography.  By ultrasound this confirmed a 1.0 cm irregular mass with lobulated margins in the upper outer quadrant of the right breast.  There was a second, 0.4 cm lobulated mass in the same quadrant.  The right axilla was sonographically benign.  On 06/20/2017 biopsy of the 2 right breast masses in question was performed.  The final pathology (SAA [id number]) found the smaller mass to be only fibrocystic change.  This is felt to be concordant.  The larger mass however was an invasive ductal carcinoma, grade 3, estrogen and progesterone receptor negative, with an MIB-1 of 30%, and HER-2 amplified, with a signals ratio of 2.24.  The number per cell was 4.60.  The patient's subsequent history is as detailed below.  INTERVAL HISTORY: Deborra returns today for follow-up of her estrogen receptor negative breast cancer.    The patient continues on trastuzumab, with a dose due today.   Her most recent echocardiogram was 02/05/2018, showing an ejection fraction in the 60-65% range.  REVIEW OF SYSTEMS: Regis is doing well. She went to the [place] on a cruise. She had a wonderful time, adding [place] was her favorite stop. Her family is doing well, besides her mother, 50, who has alzheimer's and breast cancer. Mother of the patient's breast cancer is stable/ improving. However, she recently got a UTI, which set her back. Mazella has been going to [place] more often to visit her mother. This has made it harder for her to exercise. She is currently getting in about 4000 steps a day, but her goal is 10000. Her neuropathy is improving. She has at least one bowel movement a day, sometimes more, adding they are on the harder side. She doesn't take a stool softener at this time. Her blood pressure has been elevated a couple times more recently. She went to see her PCP and was given medication. However, she never started it because she felt it was related to her recent visit with her mother, adding it has since stabilized. The patient denies unusual headaches, visual changes, nausea, vomiting, or dizziness. There has been no unusual cough, phlegm production, or pleurisy. This been no change in bladder habits. The patient denies unexplained fatigue or unexplained weight loss, bleeding, rash, or fever. A detailed review of systems was otherwise noncontributory.   PAST MEDICAL HISTORY: Past Medical History:  Diagnosis Date  . Breast cancer ([place]) 05/2017   right breast  . Eczema   . Family history of breast cancer   . Family history of  ovarian cancer   . History of radiation therapy 10/17/17- 11/14/17   40.05 directed to the right breast in 15 fractions, followed by a boost of 10 gy given in 5 fractions.   . Hypertension    toxemia during pregnancy, no meds now    PAST SURGICAL HISTORY: Past Surgical History:  Procedure  Laterality Date  . ABDOMINAL HYSTERECTOMY    . BREAST LUMPECTOMY WITH RADIOACTIVE SEED AND SENTINEL LYMPH NODE BIOPSY Right 07/17/2017   Procedure: BREAST LUMPECTOMY WITH RADIOACTIVE SEED AND SENTINEL LYMPH NODE BIOPSY;  Surgeon: Rolm Bookbinder, MD;  Location: [place];  Service: General;  Laterality: Right;  . CESAREAN SECTION     x4  . DILATION AND CURETTAGE OF UTERUS    . KNEE SURGERY Right   . PORTACATH PLACEMENT N/A 07/17/2017   Procedure: INSERTION PORT-A-CATH WITH [place];  Surgeon: Rolm Bookbinder, MD;  Location: [place];  Service: General;  Laterality: N/A;    FAMILY HISTORY Family History  Problem Relation Age of Onset  . Breast cancer Mother 36       again at 58 in other breast   . Heart attack Father 53  . Breast cancer Sister 50  . Breast cancer Maternal Grandmother 43       spread to lungs, died at 34  . Ovarian cancer Cousin 2  . Prostate cancer Cousin   The patient's father died at age 56 from a heart attack.  The patient's mother is currently living at age 76 (as of January 2019).  The patient had 1 sister who was diagnosed with breast cancer at age 3 and died from metastatic disease at age 60.  The patient has 1 brother.  In addition the patient's mother was diagnosed with breast cancer at age 28, on the left side, and now has a right-sided breast cancer diagnosed in January 2019.  There is in addition a cousin with ovarian cancer diagnosed when she was 68 years old  GYNECOLOGIC HISTORY:  No LMP recorded. Patient has had a hysterectomy. Menarche age 77, first live birth age 28, the patient had 3 live births, 1 of whom survived only 2 days.  She underwent hysterectomy without salpingo-oophorectomy September 05, 1978.  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  She is now retired.  Her husband [name] his Theme park manager at  [employer].  The patient's daughter [name] lives in [place] where she is an Tourist information centre manager.  The patient's daughter [name] lives in [place] working for the department of defense.  The patient has 3 grandchildren.     ADVANCED DIRECTIVES: Not in place   HEALTH MAINTENANCE: Social History   Tobacco Use  . Smoking status: Never Smoker  . Smokeless tobacco: Never Used  Substance Use Topics  . Alcohol use: Yes    Comment: social  . Drug use: No     Colonoscopy: April 2018/[facility]  PAP: Status post hysterectomy  Bone density:   Allergies  Allergen Reactions  . Tape Itching and Rash  . Nsaids   . Antihistamines, Diphenhydramine-Type Other (See Comments)    Pt stated she gets lump in throat and stomach when taking histamines.   . Sulfa Antibiotics Rash    Current Outpatient Medications  Medication Sig Dispense Refill  . aspirin 81 MG chewable tablet Chew 81 mg by mouth daily.    Marland Kitchen  CALCIUM-VITAMIN D PO Take 1 tablet by mouth daily.    . Coenzyme Q10 (COQ10 PO) Take by mouth every other day.     . ergocalciferol (VITAMIN D2) 50000 units capsule Take 50,000 Units by mouth once a week.    . hydrochlorothiazide (MICROZIDE) 12.5 MG capsule Take 1 capsule (12.5 mg total) by mouth daily. 90 capsule 3  . lidocaine-prilocaine (EMLA) cream Apply 1 application topically as needed. 30 g 0  . losartan (COZAAR) 100 MG tablet Take 100 mg by mouth daily.    . pravastatin (PRAVACHOL) 80 MG tablet Take 1 tablet (80 mg total) by mouth daily at 6 PM. (Patient taking differently: Take 80 mg by mouth every other day. ) 60 tablet 0  . vitamin B-12 (CYANOCOBALAMIN) 1000 MCG tablet Take 1,000 mcg by mouth daily.     No current facility-administered medications for this visit.     OBJECTIVE: Middle-aged African-American woman in no acute distress  Vitals:   04/30/18 1017  BP: (!) 166/76  Pulse: 74  Resp: 18  Temp: 98.4 F (36.9 C)  SpO2: 100%     Body mass index is  26.06 kg/m.    Wt Readings from Last 3 Encounters:  04/30/18 147 lb 1.6 oz (66.7 kg)  04/11/18 151 lb (68.5 kg)  04/09/18 152 lb 6.4 oz (69.1 kg)  ECOG FS:0 - Asymptomatic   Sclerae unicteric, pupils round and equal Oropharynx clear and moist No cervical or supraclavicular adenopathy Lungs no rales or rhonchi Heart regular rate and rhythm Abd soft, nontender, positive bowel sounds MSK no focal spinal tenderness, no upper extremity lymphedema Neuro: nonfocal, well oriented, appropriate affect Breasts: The right breast is status post lumpectomy and radiation.  There is still some hyperpigmentation.  The breast is slightly firmer than the left as expected.  There is no evidence of disease recurrence.  Left breast is benign.  Both axillae are benign.  LAB RESULTS:  CMP     Component Value Date/Time   NA 142 04/09/2018 0904   K 4.0 04/09/2018 0904   CL 105 04/09/2018 0904   CO2 26 04/09/2018 0904   GLUCOSE 98 04/09/2018 0904   BUN 11 04/09/2018 0904   CREATININE 0.97 04/09/2018 0904   CREATININE 1.01 06/27/2017 0843   CALCIUM 9.7 04/09/2018 0904   PROT 7.2 04/09/2018 0904   ALBUMIN 3.8 04/09/2018 0904   AST 20 04/09/2018 0904   AST 16 06/27/2017 0843   ALT 16 04/09/2018 0904   ALT 14 06/27/2017 0843   ALKPHOS 79 04/09/2018 0904   BILITOT 0.4 04/09/2018 0904   BILITOT 0.2 06/27/2017 0843   GFRNONAA 59 (L) 04/09/2018 0904   GFRNONAA 56 (L) 06/27/2017 0843   GFRAA >60 04/09/2018 0904   GFRAA >60 06/27/2017 0843    No results found for: TOTALPROTELP, ALBUMINELP, A1GS, A2GS, BETS, BETA2SER, GAMS, MSPIKE, SPEI  No results found for: KPAFRELGTCHN, LAMBDASER, KAPLAMBRATIO  Lab Results  Component Value Date   WBC 4.2 04/30/2018   NEUTROABS 2.1 04/30/2018   HGB 10.4 (L) 04/30/2018   HCT 31.8 (L) 04/30/2018   MCV 88.1 04/30/2018   PLT 251 04/30/2018    '@LASTCHEMISTRY' @  No results found for: LABCA2  No components found for: [id number]  No results for input(s): INR in  the last 168 hours.  No results found for: LABCA2  No results found for: EXN170  No results found for: YFV494  No results found for: WHQ759  No results found for: CA2729  No components found for:  HGQUANT  No results found for: CEA1 / No results found for: CEA1   No results found for: AFPTUMOR  No results found for: [place]  No results found for: PSA1  Appointment on 04/30/2018  Component Date Value Ref Range Status  . WBC 04/30/2018 4.2  4.0 - 10.5 K/uL Final  . RBC 04/30/2018 3.61* 3.87 - 5.11 MIL/uL Final  . Hemoglobin 04/30/2018 10.4* 12.0 - 15.0 g/dL Final  . HCT 04/30/2018 31.8* 36.0 - 46.0 % Final  . MCV 04/30/2018 88.1  80.0 - 100.0 fL Final  . MCH 04/30/2018 28.8  26.0 - 34.0 pg Final  . MCHC 04/30/2018 32.7  30.0 - 36.0 g/dL Final  . RDW 04/30/2018 13.1  11.5 - 15.5 % Final  . Platelets 04/30/2018 251  150 - 400 K/uL Final  . nRBC 04/30/2018 0.0  0.0 - 0.2 % Final  . Neutrophils Relative % 04/30/2018 50  % Final  . Neutro Abs 04/30/2018 2.1  1.7 - 7.7 K/uL Final  . Lymphocytes Relative 04/30/2018 37  % Final  . Lymphs Abs 04/30/2018 1.5  0.7 - 4.0 K/uL Final  . Monocytes Relative 04/30/2018 9  % Final  . Monocytes Absolute 04/30/2018 0.4  0.1 - 1.0 K/uL Final  . Eosinophils Relative 04/30/2018 3  % Final  . Eosinophils Absolute 04/30/2018 0.1  0.0 - 0.5 K/uL Final  . Basophils Relative 04/30/2018 1  % Final  . Basophils Absolute 04/30/2018 0.0  0.0 - 0.1 K/uL Final  . Immature Granulocytes 04/30/2018 0  % Final  . Abs Immature Granulocytes 04/30/2018 0.01  0.00 - 0.07 K/uL Final   Performed at [facility], [address] [place], [place] [address]    (this displays the last labs from the last 3 days)  No results found for: TOTALPROTELP, ALBUMINELP, A1GS, A2GS, BETS, BETA2SER, GAMS, MSPIKE, SPEI (this displays SPEP labs)  No results found for: KPAFRELGTCHN, LAMBDASER, KAPLAMBRATIO (kappa/lambda light chains)  No results  found for: HGBA, HGBA2QUANT, HGBFQUANT, HGBSQUAN (Hemoglobinopathy evaluation)   No results found for: LDH  No results found for: IRON, TIBC, IRONPCTSAT (Iron and TIBC)  No results found for: FERRITIN  Urinalysis    Component Value Date/Time   COLORURINE COLORLESS (A) 08/09/2017 2224   APPEARANCEUR CLEAR 08/09/2017 2224   LABSPEC 1.004 (L) 08/09/2017 2224   PHURINE 6.0 08/09/2017 2224   GLUCOSEU NEGATIVE 08/09/2017 2224   HGBUR NEGATIVE 08/09/2017 2224   BILIRUBINUR NEGATIVE 08/09/2017 2224   KETONESUR NEGATIVE 08/09/2017 2224   PROTEINUR NEGATIVE 08/09/2017 2224   NITRITE NEGATIVE 08/09/2017 2224   LEUKOCYTESUR NEGATIVE 08/09/2017 2224     STUDIES: Due for repeat mammography December 2019  ELIGIBLE FOR AVAILABLE RESEARCH PROTOCOL: no  ASSESSMENT: 67 y.o. [place] woman status post right breast upper outer quadrant biopsy 06/20/2017 for a clinical T1b N0, stage IA invasive ductal carcinoma, grade 3, estrogen and progesterone receptor negative, but HER-2 amplified, with an MIB-1 of 30%  (1) genetics testing 07/05/2017 through the Common Hereditary Cancer Panel offered by [facility] found no deleterious mutations in APC, ATM, AXIN2, BARD1, BMPR1A, BRCA1, BRCA2, BRIP1, CDH1, CDKN2A (p14ARF), CDKN2A (p16INK4a), CKD4, CHEK2, CTNNA1, DICER1, EPCAM (Deletion/duplication testing only), GREM1 (promoter region deletion/duplication testing only), KIT, MEN1, MLH1, MSH2, MSH3, MSH6, MUTYH, NBN, NF1, NHTL1, PALB2, PDGFRA, PMS2, POLD1, POLE, PTEN, RAD50, RAD51C, RAD51D, SDHB, SDHC, SDHD, SMAD4, SMARCA4. STK11, TP53, TSC1, TSC2, and VHL.  The following genes were evaluated for sequence changes only: SDHA and HOXB13 c.251G>A variant only.  (  a) a Variant of uncertain significance in MSH2 was identified c.1331G>T (p.Arg444Leu).   (2) status post right lumpectomy and sentinel lymph node sampling 07/17/2017 for a pT1c pN0, stage IA invasive ductal carcinoma, grade 2, with negative  margins.  A total of 5 lymph nodes were removed  (3) adjuvant chemotherapy consisting of paclitaxel weekly x12 together with trastuzumab every 21 days starting 08/07/2017  (a) paclitaxel stopped after 8 doses because of neuropathy (last dose 09/25/2017  (4) trastuzumab will be continued to total 1 year (through mid FEB 2020)  (a) echo 08/10/2017 showed an ejection fraction in the 65-70% range  (b) echo on 11/01/2017 shows EF of 60-65%  (c) cardiogram 02/05/2018 shows an ejection fraction in the 60-65% range.  (d) echocardiogram scheduled for 05/28/2018  5) adjuvant radiation 10/17/2017-11/14/2016: 40.05 Gy directed to the Right Breast in 15 fractions, followed by a boost of 10 Gy given in 5 fractions    PLAN:  Zelpha is tolerating the trastuzumab well and the plan will be to continue that through 07/30/2018 which will be her last dose.  She has a normocytic anemia which is very stable and is likely due to anemia of chronic illness.  I am going to add basic lab work with the next set of labs just to confirm  Have encouraged her to continue and improve on her exercise schedule  Otherwise she knows to call for any problems that may develop before the next visit.  Gianny Sabino, Virgie Dad, MD  04/30/18 10:47 AM Medical Oncology and Hematology [facility] [address] [place], [place] [address] Tel. [phone]    Fax. [phone]  I, Margit Banda am acting as a scribe for Chauncey Cruel, MD.   I, Lurline Del MD, have reviewed the above documentation for accuracy and completeness, and I agree with the above.

## 2018-04-30 ENCOUNTER — Inpatient Hospital Stay (HOSPITAL_BASED_OUTPATIENT_CLINIC_OR_DEPARTMENT_OTHER): Payer: Medicare Other | Admitting: Oncology

## 2018-04-30 ENCOUNTER — Inpatient Hospital Stay: Payer: Medicare Other | Attending: Oncology

## 2018-04-30 ENCOUNTER — Inpatient Hospital Stay: Payer: Medicare Other

## 2018-04-30 ENCOUNTER — Telehealth: Payer: Self-pay | Admitting: Oncology

## 2018-04-30 VITALS — BP 166/76 | HR 74 | Temp 98.4°F | Resp 18 | Ht 63.0 in | Wt 147.1 lb

## 2018-04-30 DIAGNOSIS — Z95828 Presence of other vascular implants and grafts: Secondary | ICD-10-CM

## 2018-04-30 DIAGNOSIS — C50411 Malignant neoplasm of upper-outer quadrant of right female breast: Secondary | ICD-10-CM

## 2018-04-30 DIAGNOSIS — Z171 Estrogen receptor negative status [ER-]: Secondary | ICD-10-CM

## 2018-04-30 DIAGNOSIS — Z5112 Encounter for antineoplastic immunotherapy: Secondary | ICD-10-CM | POA: Insufficient documentation

## 2018-04-30 DIAGNOSIS — Z23 Encounter for immunization: Secondary | ICD-10-CM | POA: Insufficient documentation

## 2018-04-30 DIAGNOSIS — Z803 Family history of malignant neoplasm of breast: Secondary | ICD-10-CM

## 2018-04-30 DIAGNOSIS — D649 Anemia, unspecified: Secondary | ICD-10-CM | POA: Insufficient documentation

## 2018-04-30 DIAGNOSIS — G629 Polyneuropathy, unspecified: Secondary | ICD-10-CM

## 2018-04-30 LAB — CBC WITH DIFFERENTIAL/PLATELET
Abs Immature Granulocytes: 0.01 10*3/uL (ref 0.00–0.07)
Basophils Absolute: 0 10*3/uL (ref 0.0–0.1)
Basophils Relative: 1 %
Eosinophils Absolute: 0.1 10*3/uL (ref 0.0–0.5)
Eosinophils Relative: 3 %
HCT: 31.8 % — ABNORMAL LOW (ref 36.0–46.0)
Hemoglobin: 10.4 g/dL — ABNORMAL LOW (ref 12.0–15.0)
Immature Granulocytes: 0 %
Lymphocytes Relative: 37 %
Lymphs Abs: 1.5 10*3/uL (ref 0.7–4.0)
MCH: 28.8 pg (ref 26.0–34.0)
MCHC: 32.7 g/dL (ref 30.0–36.0)
MCV: 88.1 fL (ref 80.0–100.0)
Monocytes Absolute: 0.4 10*3/uL (ref 0.1–1.0)
Monocytes Relative: 9 %
Neutro Abs: 2.1 10*3/uL (ref 1.7–7.7)
Neutrophils Relative %: 50 %
Platelets: 251 10*3/uL (ref 150–400)
RBC: 3.61 MIL/uL — ABNORMAL LOW (ref 3.87–5.11)
RDW: 13.1 % (ref 11.5–15.5)
WBC: 4.2 10*3/uL (ref 4.0–10.5)
nRBC: 0 % (ref 0.0–0.2)

## 2018-04-30 LAB — COMPREHENSIVE METABOLIC PANEL
ALT: 18 U/L (ref 0–44)
AST: 18 U/L (ref 15–41)
Albumin: 3.9 g/dL (ref 3.5–5.0)
Alkaline Phosphatase: 84 U/L (ref 38–126)
Anion gap: 11 (ref 5–15)
BUN: 17 mg/dL (ref 8–23)
CO2: 28 mmol/L (ref 22–32)
Calcium: 9.6 mg/dL (ref 8.9–10.3)
Chloride: 104 mmol/L (ref 98–111)
Creatinine, Ser: 1.05 mg/dL — ABNORMAL HIGH (ref 0.44–1.00)
GFR calc Af Amer: >60 mL/min (ref >60)
GFR calc non Af Amer: 53 mL/min — ABNORMAL LOW (ref >60)
Glucose, Bld: 109 mg/dL — ABNORMAL HIGH (ref 70–99)
Potassium: 3.2 mmol/L — ABNORMAL LOW (ref 3.5–5.1)
Sodium: 143 mmol/L (ref 135–145)
Total Bilirubin: 0.4 mg/dL (ref 0.3–1.2)
Total Protein: 7.4 g/dL (ref 6.5–8.1)

## 2018-04-30 MED ORDER — SODIUM CHLORIDE 0.9% FLUSH
10.0000 mL | INTRAVENOUS | Status: DC | PRN
  Administered 2018-04-30: 10 mL
  Filled 2018-04-30: qty 10

## 2018-04-30 MED ORDER — ACETAMINOPHEN 325 MG PO TABS
650.0000 mg | ORAL_TABLET | Freq: Once | ORAL | Status: AC
  Administered 2018-04-30: 650 mg via ORAL

## 2018-04-30 MED ORDER — INFLUENZA VAC SPLIT QUAD 0.5 ML IM SUSY
0.5000 mL | PREFILLED_SYRINGE | Freq: Once | INTRAMUSCULAR | Status: AC
  Administered 2018-04-30: 0.5 mL via INTRAMUSCULAR

## 2018-04-30 MED ORDER — DIPHENHYDRAMINE HCL 25 MG PO CAPS
25.0000 mg | ORAL_CAPSULE | Freq: Once | ORAL | Status: AC
  Administered 2018-04-30: 25 mg via ORAL

## 2018-04-30 MED ORDER — INFLUENZA VAC SPLIT QUAD 0.5 ML IM SUSY
PREFILLED_SYRINGE | INTRAMUSCULAR | Status: AC
  Filled 2018-04-30: qty 0.5

## 2018-04-30 MED ORDER — SODIUM CHLORIDE 0.9 % IV SOLN
Freq: Once | INTRAVENOUS | Status: AC
  Administered 2018-04-30: 11:00:00 via INTRAVENOUS
  Filled 2018-04-30: qty 250

## 2018-04-30 MED ORDER — DIPHENHYDRAMINE HCL 25 MG PO CAPS
ORAL_CAPSULE | ORAL | Status: AC
  Filled 2018-04-30: qty 1

## 2018-04-30 MED ORDER — TRASTUZUMAB CHEMO 150 MG IV SOLR
450.0000 mg | Freq: Once | INTRAVENOUS | Status: AC
  Administered 2018-04-30: 450 mg via INTRAVENOUS
  Filled 2018-04-30: qty 21.4

## 2018-04-30 MED ORDER — HEPARIN SOD (PORK) LOCK FLUSH 100 UNIT/ML IV SOLN
500.0000 [IU] | Freq: Once | INTRAVENOUS | Status: AC | PRN
  Administered 2018-04-30: 500 [IU]
  Filled 2018-04-30: qty 5

## 2018-04-30 MED ORDER — ACETAMINOPHEN 325 MG PO TABS
ORAL_TABLET | ORAL | Status: AC
  Filled 2018-04-30: qty 2

## 2018-04-30 NOTE — Patient Instructions (Signed)
[facility] Discharge Instructions for Patients Receiving Chemotherapy  Today you received the following chemotherapy agents Herceptin  To help prevent nausea and vomiting after your treatment, we encourage you to take your nausea medication as directed   If you develop nausea and vomiting that is not controlled by your nausea medication, call the clinic.   BELOW ARE SYMPTOMS THAT SHOULD BE REPORTED IMMEDIATELY:  *FEVER GREATER THAN 100.5 F  *CHILLS WITH OR WITHOUT FEVER  NAUSEA AND VOMITING THAT IS NOT CONTROLLED WITH YOUR NAUSEA MEDICATION  *UNUSUAL SHORTNESS OF BREATH  *UNUSUAL BRUISING OR BLEEDING  TENDERNESS IN MOUTH AND THROAT WITH OR WITHOUT PRESENCE OF ULCERS  *URINARY PROBLEMS  *BOWEL PROBLEMS  UNUSUAL RASH Items with * indicate a potential emergency and should be followed up as soon as possible.  Feel free to call the clinic should you have any questions or concerns. The clinic phone number is [phone].  Please show the CHEMO ALERT CARD at check-in to the Emergency Department and triage nurse.   

## 2018-04-30 NOTE — Telephone Encounter (Signed)
Pt sched per her request Q21 day cycle. Gave pt avs and calendar

## 2018-05-21 ENCOUNTER — Inpatient Hospital Stay: Payer: Medicare Other

## 2018-05-21 ENCOUNTER — Inpatient Hospital Stay: Payer: Medicare Other | Attending: Oncology

## 2018-05-21 VITALS — BP 155/88 | HR 70 | Temp 98.4°F | Resp 18

## 2018-05-21 DIAGNOSIS — Z95828 Presence of other vascular implants and grafts: Secondary | ICD-10-CM

## 2018-05-21 DIAGNOSIS — Z5112 Encounter for antineoplastic immunotherapy: Secondary | ICD-10-CM | POA: Diagnosis present

## 2018-05-21 DIAGNOSIS — C50411 Malignant neoplasm of upper-outer quadrant of right female breast: Secondary | ICD-10-CM | POA: Insufficient documentation

## 2018-05-21 DIAGNOSIS — Z171 Estrogen receptor negative status [ER-]: Secondary | ICD-10-CM

## 2018-05-21 LAB — VITAMIN B12: Vitamin B-12: 2595 pg/mL — ABNORMAL HIGH (ref 180–914)

## 2018-05-21 LAB — CBC WITH DIFFERENTIAL/PLATELET
Abs Immature Granulocytes: 0.01 10*3/uL (ref 0.00–0.07)
Basophils Absolute: 0 10*3/uL (ref 0.0–0.1)
Basophils Relative: 1 %
Eosinophils Absolute: 0.1 10*3/uL (ref 0.0–0.5)
Eosinophils Relative: 3 %
HCT: 32.6 % — ABNORMAL LOW (ref 36.0–46.0)
Hemoglobin: 10.5 g/dL — ABNORMAL LOW (ref 12.0–15.0)
Immature Granulocytes: 0 %
Lymphocytes Relative: 36 %
Lymphs Abs: 1.4 10*3/uL (ref 0.7–4.0)
MCH: 28.2 pg (ref 26.0–34.0)
MCHC: 32.2 g/dL (ref 30.0–36.0)
MCV: 87.4 fL (ref 80.0–100.0)
Monocytes Absolute: 0.4 10*3/uL (ref 0.1–1.0)
Monocytes Relative: 9 %
Neutro Abs: 2 10*3/uL (ref 1.7–7.7)
Neutrophils Relative %: 51 %
Platelets: 261 10*3/uL (ref 150–400)
RBC: 3.73 MIL/uL — ABNORMAL LOW (ref 3.87–5.11)
RDW: 13.5 % (ref 11.5–15.5)
WBC: 3.9 10*3/uL — ABNORMAL LOW (ref 4.0–10.5)
nRBC: 0 % (ref 0.0–0.2)

## 2018-05-21 LAB — COMPREHENSIVE METABOLIC PANEL
ALT: 33 U/L (ref 0–44)
AST: 37 U/L (ref 15–41)
Albumin: 4.1 g/dL (ref 3.5–5.0)
Alkaline Phosphatase: 77 U/L (ref 38–126)
Anion gap: 10 (ref 5–15)
BUN: 15 mg/dL (ref 8–23)
CO2: 27 mmol/L (ref 22–32)
Calcium: 9.9 mg/dL (ref 8.9–10.3)
Chloride: 103 mmol/L (ref 98–111)
Creatinine, Ser: 1.1 mg/dL — ABNORMAL HIGH (ref 0.44–1.00)
GFR calc Af Amer: 60 mL/min — ABNORMAL LOW (ref >60)
GFR calc non Af Amer: 52 mL/min — ABNORMAL LOW (ref >60)
Glucose, Bld: 90 mg/dL (ref 70–99)
Potassium: 3.8 mmol/L (ref 3.5–5.1)
Sodium: 140 mmol/L (ref 135–145)
Total Bilirubin: 0.5 mg/dL (ref 0.3–1.2)
Total Protein: 7.6 g/dL (ref 6.5–8.1)

## 2018-05-21 LAB — FERRITIN: Ferritin: 24 ng/mL (ref 11–307)

## 2018-05-21 LAB — IRON AND TIBC
Iron: 77 ug/dL (ref 41–142)
Saturation Ratios: 19 % — ABNORMAL LOW (ref 21–57)
TIBC: 404 ug/dL (ref 236–444)
UIBC: 327 ug/dL (ref 120–384)

## 2018-05-21 LAB — FOLATE: Folate: 20.7 ng/mL (ref >5.9)

## 2018-05-21 LAB — RETICULOCYTES
Immature Retic Fract: 16.2 % — ABNORMAL HIGH (ref 2.3–15.9)
RBC.: 3.73 MIL/uL — ABNORMAL LOW (ref 3.87–5.11)
Retic Count, Absolute: 48.1 10*3/uL (ref 19.0–186.0)
Retic Ct Pct: 1.3 % (ref 0.4–3.1)

## 2018-05-21 LAB — SAVE SMEAR (SSMR)

## 2018-05-21 MED ORDER — SODIUM CHLORIDE 0.9% FLUSH
10.0000 mL | INTRAVENOUS | Status: DC | PRN
  Administered 2018-05-21: 10 mL
  Filled 2018-05-21: qty 10

## 2018-05-21 MED ORDER — HEPARIN SOD (PORK) LOCK FLUSH 100 UNIT/ML IV SOLN
500.0000 [IU] | Freq: Once | INTRAVENOUS | Status: AC | PRN
  Administered 2018-05-21: 500 [IU]
  Filled 2018-05-21: qty 5

## 2018-05-21 MED ORDER — ACETAMINOPHEN 325 MG PO TABS
ORAL_TABLET | ORAL | Status: AC
  Filled 2018-05-21: qty 2

## 2018-05-21 MED ORDER — SODIUM CHLORIDE 0.9 % IV SOLN
Freq: Once | INTRAVENOUS | Status: AC
  Administered 2018-05-21: 10:00:00 via INTRAVENOUS
  Filled 2018-05-21: qty 250

## 2018-05-21 MED ORDER — ACETAMINOPHEN 325 MG PO TABS
650.0000 mg | ORAL_TABLET | Freq: Once | ORAL | Status: AC
  Administered 2018-05-21: 650 mg via ORAL

## 2018-05-21 MED ORDER — DIPHENHYDRAMINE HCL 25 MG PO CAPS
25.0000 mg | ORAL_CAPSULE | Freq: Once | ORAL | Status: AC
  Administered 2018-05-21: 25 mg via ORAL

## 2018-05-21 MED ORDER — TRASTUZUMAB CHEMO 150 MG IV SOLR
450.0000 mg | Freq: Once | INTRAVENOUS | Status: AC
  Administered 2018-05-21: 450 mg via INTRAVENOUS
  Filled 2018-05-21: qty 21.43

## 2018-05-21 MED ORDER — DIPHENHYDRAMINE HCL 25 MG PO CAPS
ORAL_CAPSULE | ORAL | Status: AC
  Filled 2018-05-21: qty 1

## 2018-05-21 NOTE — Patient Instructions (Signed)
[facility] Discharge Instructions for Patients Receiving Chemotherapy  Today you received the following chemotherapy agents Herceptin  To help prevent nausea and vomiting after your treatment, we encourage you to take your nausea medication as directed   If you develop nausea and vomiting that is not controlled by your nausea medication, call the clinic.   BELOW ARE SYMPTOMS THAT SHOULD BE REPORTED IMMEDIATELY:  *FEVER GREATER THAN 100.5 F  *CHILLS WITH OR WITHOUT FEVER  NAUSEA AND VOMITING THAT IS NOT CONTROLLED WITH YOUR NAUSEA MEDICATION  *UNUSUAL SHORTNESS OF BREATH  *UNUSUAL BRUISING OR BLEEDING  TENDERNESS IN MOUTH AND THROAT WITH OR WITHOUT PRESENCE OF ULCERS  *URINARY PROBLEMS  *BOWEL PROBLEMS  UNUSUAL RASH Items with * indicate a potential emergency and should be followed up as soon as possible.  Feel free to call the clinic should you have any questions or concerns. The clinic phone number is [phone].  Please show the CHEMO ALERT CARD at check-in to the Emergency Department and triage nurse.   

## 2018-05-21 NOTE — Progress Notes (Signed)
Pt scheduled for ECHO 12/10

## 2018-05-28 ENCOUNTER — Ambulatory Visit (HOSPITAL_BASED_OUTPATIENT_CLINIC_OR_DEPARTMENT_OTHER)
Admission: RE | Admit: 2018-05-28 | Discharge: 2018-05-28 | Disposition: A | Discharge status: Home / Self Care | Payer: Medicare Other | Source: Ambulatory Visit | Attending: Internal Medicine | Admitting: Internal Medicine

## 2018-05-28 ENCOUNTER — Encounter (HOSPITAL_COMMUNITY): Payer: Self-pay | Admitting: Internal Medicine

## 2018-05-28 ENCOUNTER — Ambulatory Visit (HOSPITAL_COMMUNITY)
Admission: RE | Admit: 2018-05-28 | Discharge: 2018-05-28 | Disposition: A | Discharge status: Home / Self Care | Payer: Medicare Other | Source: Ambulatory Visit | Attending: Internal Medicine | Admitting: Internal Medicine

## 2018-05-28 VITALS — BP 134/78 | HR 65 | Wt 150.6 lb

## 2018-05-28 DIAGNOSIS — C50011 Malignant neoplasm of nipple and areola, right female breast: Secondary | ICD-10-CM | POA: Diagnosis not present

## 2018-05-28 DIAGNOSIS — Z888 Allergy status to other drugs, medicaments and biological substances status: Secondary | ICD-10-CM | POA: Insufficient documentation

## 2018-05-28 DIAGNOSIS — Z886 Allergy status to analgesic agent status: Secondary | ICD-10-CM | POA: Insufficient documentation

## 2018-05-28 DIAGNOSIS — Z79899 Other long term (current) drug therapy: Secondary | ICD-10-CM | POA: Diagnosis not present

## 2018-05-28 DIAGNOSIS — Z882 Allergy status to sulfonamides status: Secondary | ICD-10-CM | POA: Insufficient documentation

## 2018-05-28 DIAGNOSIS — I10 Essential (primary) hypertension: Secondary | ICD-10-CM | POA: Insufficient documentation

## 2018-05-28 DIAGNOSIS — Z803 Family history of malignant neoplasm of breast: Secondary | ICD-10-CM | POA: Insufficient documentation

## 2018-05-28 DIAGNOSIS — Z7982 Long term (current) use of aspirin: Secondary | ICD-10-CM | POA: Insufficient documentation

## 2018-05-28 DIAGNOSIS — Z8673 Personal history of transient ischemic attack (TIA), and cerebral infarction without residual deficits: Secondary | ICD-10-CM | POA: Insufficient documentation

## 2018-05-28 DIAGNOSIS — Z8041 Family history of malignant neoplasm of ovary: Secondary | ICD-10-CM | POA: Insufficient documentation

## 2018-05-28 MED ORDER — LOSARTAN POTASSIUM 100 MG PO TABS: 150.0000 mg | ORAL_TABLET | Freq: Every day | ORAL | 1 refills | Status: DC

## 2018-05-28 NOTE — Progress Notes (Signed)
  Echocardiogram 2D Echocardiogram has been performed.  Alessander Sikorski L Androw 05/28/2018, 11:52 AM

## 2018-05-28 NOTE — Addendum Note (Signed)
Encounter addended by: Valeda Malm, RN on: 05/28/2018 12:26 PM  Actions taken: Sign clinical note

## 2018-05-28 NOTE — Progress Notes (Signed)
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 Jana Hakim for enrollment into the Cardio-Oncology program for herceptin surveillance. .  Denies h/o of major cardiac problems. Was having palpitations in distant past and thought she had MVP. Followed with Dr. Melvern Banker who felt palpitations were stress-related. Echo ok.   In 1/19 diagnosed with clinical T1b N0, stage IA invasive ductal carcinoma, grade 3, estrogen and progesterone receptor negative, but HER-2 amplified, with an MIB-1 of 30% right breast CA.   Breast cancer history as below   (1) status post right lumpectomy and sentinel lymph node sampling 07/17/2017 for a pT1c pN0, stage IA invasive ductal carcinoma, grade 2, with negative margins.  A total of 5 lymph nodes were removed  (2) adjuvant chemotherapy will consist of paclitaxel weekly x12 together with trastuzumab every 21 days starting 08/07/2017             (a) paclitaxel stopped after 8 doses because of neuropathy (last dose 09/25/2017)  (3) trastuzumab will be continued to total 1 year             (a) echo 08/10/2017 showed an ejection fraction in the 65-70% range   (4) XRT started in 10/17/17  Admitted to [facility] in 2/19 with ? TIA. Was slurring her words. SBP > 200 at the time (says this was new diagnosis of HTN). Seen by Neurology and w/u negative including MRI. Was started on lisinopril and has been titrated up.   Returns to day for routine f/u. Remains on Herceptin. Tolerating well. Going to exercise and line dance classes. Feeling great. BP a little high at treatments but well-controlled at home. No SOB or CP. Finishes on February 4.   Echo today EF 65-70% grade I DD - GLS -20.6%, LS 7.9 cm/s. Personally reviewed  Echo 2/19 60-65% GLS -20.9% Bubble study negative  Past Medical History:  Diagnosis Date  . Breast cancer ([place]) 05/2017   right breast  . Eczema   . Family history of breast cancer   . Family history of ovarian cancer   . History of radiation therapy 10/17/17- 11/14/17   40.05 directed to the right breast in 15 fractions, followed by a boost of 10 gy given in 5 fractions.   . Hypertension    toxemia during pregnancy, no meds now    Current Outpatient Medications  Medication Sig Dispense Refill  . aspirin 81 MG chewable tablet Chew 81 mg by mouth daily.    Marland Kitchen CALCIUM-VITAMIN D PO Take 1 tablet by mouth daily.    . Coenzyme Q10 (COQ10 PO) Take by mouth every other day.     . hydrochlorothiazide (MICROZIDE) 12.5 MG capsule Take 1 capsule (12.5 mg total) by mouth daily. 90 capsule 3  . lidocaine-prilocaine (EMLA) cream Apply 1 application topically as needed. 30 g 0  . pravastatin (PRAVACHOL) 80 MG tablet Take 1 tablet (80 mg total) by mouth daily at 6 PM. (Patient taking differently: Take 80 mg by mouth every other day. ) 60 tablet 0  . vitamin B-12 (CYANOCOBALAMIN) 1000 MCG tablet Take 1,000 mcg by mouth daily.    Marland Kitchen losartan (COZAAR) 100 MG tablet Take 1.5 tablets (150 mg total) by mouth daily. 140 tablet 1   No current facility-administered medications for this encounter.     Allergies  Allergen Reactions  . Tape Itching and Rash  . Nsaids   .  Antihistamines, Diphenhydramine-Type Other (See Comments)    Pt stated she gets lump in throat and stomach when taking histamines.   . Sulfa Antibiotics Rash      Social History   Socioeconomic History  . Marital status: Married    Spouse name: Not on file  . Number of children: Not on file  . Years of education: Not on file  . Highest education level: Not on file  Occupational History  . Not on file  Social Needs  . Financial resource strain: Not on file  . Food insecurity:    Worry: Not on file    Inability: Not on file  . Transportation needs:    Medical: Not on file    Non-medical: Not on file  Tobacco Use  . Smoking status: Never Smoker  .  Smokeless tobacco: Never Used  Substance and Sexual Activity  . Alcohol use: Yes    Comment: social  . Drug use: No  . Sexual activity: Not Currently  Lifestyle  . Physical activity:    Days per week: Not on file    Minutes per session: Not on file  . Stress: Not on file  Relationships  . Social connections:    Talks on phone: Not on file    Gets together: Not on file    Attends religious service: Not on file    Active member of club or organization: Not on file    Attends meetings of clubs or organizations: Not on file    Relationship status: Not on file  . Intimate partner violence:    Fear of current or ex partner: Not on file    Emotionally abused: Not on file    Physically abused: Not on file    Forced sexual activity: Not on file  Other Topics Concern  . Not on file  Social History Narrative  . Not on file      Family History  Problem Relation Age of Onset  . Breast cancer Mother 3       again at 49 in other breast   . Heart attack Father 37  . Breast cancer Sister 38  . Breast cancer Maternal Grandmother 50       spread to lungs, died at 5  . Ovarian cancer Cousin 16  . Prostate cancer Cousin     Vitals:   05/28/18 1213  BP: 134/78  Pulse: 65  SpO2: 99%  Weight: 68.3 kg (150 lb 9.6 oz)    PHYSICAL EXAM: General:  Well appearing. No resp difficulty HEENT: normal Neck: supple. no JVD. Carotids 2+ bilat; no bruits. No lymphadenopathy or thryomegaly appreciated. Cor: PMI nondisplaced. Regular rate & rhythm. No rubs, gallops or murmurs. +porta cath Lungs: clear Abdomen: soft, nontender, nondistended. No hepatosplenomegaly. No bruits or masses. Good bowel sounds. Extremities: no cyanosis, clubbing, rash, edema Neuro: alert & orientedx3, cranial nerves grossly intact. moves all 4 extremities w/o difficulty. Affect pleasant     ASSESSMENT & PLAN: 1. Right Breast Cancer - diagnosed 1/19 - clinical T1b N0, stage IA invasive ductal carcinoma, grade 3,  estrogen and progesterone receptor negative, but HER-2 amplified, with an MIB-1 of 30%  -.I reviewed echos personally. EF and Doppler parameters stable. No HF on exam. Continue Herceptin.  - Completes Herceptin on February 4. Can f/u PRN.    2. HTN  - Blood pressure well controlled. Continue current regimen.   Glori Bickers, MD  12:20 PM

## 2018-05-28 NOTE — Patient Instructions (Signed)
Follow up as needed with [facility].

## 2018-05-30 ENCOUNTER — Encounter: Payer: Self-pay | Admitting: Oncology

## 2018-06-10 ENCOUNTER — Inpatient Hospital Stay: Payer: Medicare Other

## 2018-06-10 VITALS — BP 152/70 | HR 70 | Temp 97.9°F | Resp 18

## 2018-06-10 DIAGNOSIS — C50411 Malignant neoplasm of upper-outer quadrant of right female breast: Secondary | ICD-10-CM

## 2018-06-10 DIAGNOSIS — Z95828 Presence of other vascular implants and grafts: Secondary | ICD-10-CM

## 2018-06-10 DIAGNOSIS — Z171 Estrogen receptor negative status [ER-]: Principal | ICD-10-CM

## 2018-06-10 DIAGNOSIS — Z5112 Encounter for antineoplastic immunotherapy: Secondary | ICD-10-CM | POA: Diagnosis not present

## 2018-06-10 LAB — CBC WITH DIFFERENTIAL/PLATELET
Abs Immature Granulocytes: 0.01 10*3/uL (ref 0.00–0.07)
Basophils Absolute: 0.1 10*3/uL (ref 0.0–0.1)
Basophils Relative: 1 %
Eosinophils Absolute: 0.1 10*3/uL (ref 0.0–0.5)
Eosinophils Relative: 3 %
HCT: 31.3 % — ABNORMAL LOW (ref 36.0–46.0)
Hemoglobin: 10 g/dL — ABNORMAL LOW (ref 12.0–15.0)
Immature Granulocytes: 0 %
Lymphocytes Relative: 38 %
Lymphs Abs: 1.5 10*3/uL (ref 0.7–4.0)
MCH: 28.2 pg (ref 26.0–34.0)
MCHC: 31.9 g/dL (ref 30.0–36.0)
MCV: 88.4 fL (ref 80.0–100.0)
Monocytes Absolute: 0.4 10*3/uL (ref 0.1–1.0)
Monocytes Relative: 10 %
Neutro Abs: 1.9 10*3/uL (ref 1.7–7.7)
Neutrophils Relative %: 48 %
Platelets: 250 10*3/uL (ref 150–400)
RBC: 3.54 MIL/uL — ABNORMAL LOW (ref 3.87–5.11)
RDW: 13.4 % (ref 11.5–15.5)
WBC: 4 10*3/uL (ref 4.0–10.5)
nRBC: 0 % (ref 0.0–0.2)

## 2018-06-10 LAB — COMPREHENSIVE METABOLIC PANEL
ALT: 24 U/L (ref 0–44)
AST: 21 U/L (ref 15–41)
Albumin: 3.9 g/dL (ref 3.5–5.0)
Alkaline Phosphatase: 72 U/L (ref 38–126)
Anion gap: 11 (ref 5–15)
BUN: 18 mg/dL (ref 8–23)
CO2: 25 mmol/L (ref 22–32)
Calcium: 9.4 mg/dL (ref 8.9–10.3)
Chloride: 106 mmol/L (ref 98–111)
Creatinine, Ser: 1.02 mg/dL — ABNORMAL HIGH (ref 0.44–1.00)
GFR calc Af Amer: >60 mL/min (ref >60)
GFR calc non Af Amer: 56 mL/min — ABNORMAL LOW (ref >60)
Glucose, Bld: 108 mg/dL — ABNORMAL HIGH (ref 70–99)
Potassium: 3.4 mmol/L — ABNORMAL LOW (ref 3.5–5.1)
Sodium: 142 mmol/L (ref 135–145)
Total Bilirubin: 0.3 mg/dL (ref 0.3–1.2)
Total Protein: 7.3 g/dL (ref 6.5–8.1)

## 2018-06-10 MED ORDER — DIPHENHYDRAMINE HCL 25 MG PO CAPS
ORAL_CAPSULE | ORAL | Status: AC
  Filled 2018-06-10: qty 1

## 2018-06-10 MED ORDER — SODIUM CHLORIDE 0.9 % IV SOLN
Freq: Once | INTRAVENOUS | Status: AC
  Administered 2018-06-10: 10:00:00 via INTRAVENOUS
  Filled 2018-06-10: qty 250

## 2018-06-10 MED ORDER — TRASTUZUMAB CHEMO 150 MG IV SOLR
450.0000 mg | Freq: Once | INTRAVENOUS | Status: AC
  Administered 2018-06-10: 450 mg via INTRAVENOUS
  Filled 2018-06-10: qty 21.43

## 2018-06-10 MED ORDER — SODIUM CHLORIDE 0.9% FLUSH
10.0000 mL | INTRAVENOUS | Status: DC | PRN
  Administered 2018-06-10: 10 mL
  Filled 2018-06-10: qty 10

## 2018-06-10 MED ORDER — DIPHENHYDRAMINE HCL 25 MG PO CAPS
25.0000 mg | ORAL_CAPSULE | Freq: Once | ORAL | Status: AC
  Administered 2018-06-10: 25 mg via ORAL

## 2018-06-10 MED ORDER — ACETAMINOPHEN 325 MG PO TABS
650.0000 mg | ORAL_TABLET | Freq: Once | ORAL | Status: AC
  Administered 2018-06-10: 650 mg via ORAL

## 2018-06-10 MED ORDER — ACETAMINOPHEN 325 MG PO TABS
ORAL_TABLET | ORAL | Status: AC
  Filled 2018-06-10: qty 2

## 2018-06-10 MED ORDER — HEPARIN SOD (PORK) LOCK FLUSH 100 UNIT/ML IV SOLN
500.0000 [IU] | Freq: Once | INTRAVENOUS | Status: AC | PRN
  Administered 2018-06-10: 500 [IU]
  Filled 2018-06-10: qty 5

## 2018-06-10 NOTE — Patient Instructions (Signed)
[facility] Discharge Instructions for Patients Receiving Chemotherapy  Today you received the following chemotherapy agents trastuzumab (Herceptin)  To help prevent nausea and vomiting after your treatment, we encourage you to take your nausea medication as directed  If you develop nausea and vomiting that is not controlled by your nausea medication, call the clinic.   BELOW ARE SYMPTOMS THAT SHOULD BE REPORTED IMMEDIATELY:  *FEVER GREATER THAN 100.5 F  *CHILLS WITH OR WITHOUT FEVER  NAUSEA AND VOMITING THAT IS NOT CONTROLLED WITH YOUR NAUSEA MEDICATION  *UNUSUAL SHORTNESS OF BREATH  *UNUSUAL BRUISING OR BLEEDING  TENDERNESS IN MOUTH AND THROAT WITH OR WITHOUT PRESENCE OF ULCERS  *URINARY PROBLEMS  *BOWEL PROBLEMS  UNUSUAL RASH Items with * indicate a potential emergency and should be followed up as soon as possible.  Feel free to call the clinic should you have any questions or concerns. The clinic phone number is [phone].  Please show the CHEMO ALERT CARD at check-in to the Emergency Department and triage nurse.   

## 2018-07-02 ENCOUNTER — Inpatient Hospital Stay: Payer: Medicare Other

## 2018-07-02 ENCOUNTER — Inpatient Hospital Stay: Payer: Medicare Other | Attending: Oncology

## 2018-07-02 VITALS — BP 122/67 | HR 65 | Temp 98.2°F | Resp 18

## 2018-07-02 DIAGNOSIS — Z171 Estrogen receptor negative status [ER-]: Secondary | ICD-10-CM

## 2018-07-02 DIAGNOSIS — C50411 Malignant neoplasm of upper-outer quadrant of right female breast: Secondary | ICD-10-CM

## 2018-07-02 DIAGNOSIS — Z5112 Encounter for antineoplastic immunotherapy: Secondary | ICD-10-CM | POA: Diagnosis not present

## 2018-07-02 DIAGNOSIS — Z95828 Presence of other vascular implants and grafts: Secondary | ICD-10-CM

## 2018-07-02 LAB — CBC WITH DIFFERENTIAL/PLATELET
Abs Immature Granulocytes: 0.01 10*3/uL (ref 0.00–0.07)
Basophils Absolute: 0 10*3/uL (ref 0.0–0.1)
Basophils Relative: 1 %
Eosinophils Absolute: 0.1 10*3/uL (ref 0.0–0.5)
Eosinophils Relative: 3 %
HCT: 32.7 % — ABNORMAL LOW (ref 36.0–46.0)
Hemoglobin: 10.6 g/dL — ABNORMAL LOW (ref 12.0–15.0)
Immature Granulocytes: 0 %
Lymphocytes Relative: 43 %
Lymphs Abs: 1.7 10*3/uL (ref 0.7–4.0)
MCH: 28.3 pg (ref 26.0–34.0)
MCHC: 32.4 g/dL (ref 30.0–36.0)
MCV: 87.4 fL (ref 80.0–100.0)
Monocytes Absolute: 0.3 10*3/uL (ref 0.1–1.0)
Monocytes Relative: 8 %
Neutro Abs: 1.8 10*3/uL (ref 1.7–7.7)
Neutrophils Relative %: 45 %
Platelets: 245 10*3/uL (ref 150–400)
RBC: 3.74 MIL/uL — ABNORMAL LOW (ref 3.87–5.11)
RDW: 13.1 % (ref 11.5–15.5)
WBC: 4.1 10*3/uL (ref 4.0–10.5)
nRBC: 0 % (ref 0.0–0.2)

## 2018-07-02 LAB — COMPREHENSIVE METABOLIC PANEL
ALT: 14 U/L (ref 0–44)
AST: 17 U/L (ref 15–41)
Albumin: 3.9 g/dL (ref 3.5–5.0)
Alkaline Phosphatase: 75 U/L (ref 38–126)
Anion gap: 8 (ref 5–15)
BUN: 15 mg/dL (ref 8–23)
CO2: 28 mmol/L (ref 22–32)
Calcium: 9.4 mg/dL (ref 8.9–10.3)
Chloride: 106 mmol/L (ref 98–111)
Creatinine, Ser: 1.08 mg/dL — ABNORMAL HIGH (ref 0.44–1.00)
GFR calc Af Amer: >60 mL/min (ref >60)
GFR calc non Af Amer: 53 mL/min — ABNORMAL LOW (ref >60)
Glucose, Bld: 112 mg/dL — ABNORMAL HIGH (ref 70–99)
Potassium: 3.4 mmol/L — ABNORMAL LOW (ref 3.5–5.1)
Sodium: 142 mmol/L (ref 135–145)
Total Bilirubin: 0.4 mg/dL (ref 0.3–1.2)
Total Protein: 7.3 g/dL (ref 6.5–8.1)

## 2018-07-02 MED ORDER — SODIUM CHLORIDE 0.9 % IV SOLN
Freq: Once | INTRAVENOUS | Status: AC
  Administered 2018-07-02: 10:00:00 via INTRAVENOUS
  Filled 2018-07-02: qty 250

## 2018-07-02 MED ORDER — SODIUM CHLORIDE 0.9% FLUSH
10.0000 mL | INTRAVENOUS | Status: DC | PRN
  Administered 2018-07-02: 10 mL
  Filled 2018-07-02: qty 10

## 2018-07-02 MED ORDER — DIPHENHYDRAMINE HCL 25 MG PO CAPS
25.0000 mg | ORAL_CAPSULE | Freq: Once | ORAL | Status: AC
  Administered 2018-07-02: 25 mg via ORAL

## 2018-07-02 MED ORDER — ACETAMINOPHEN 325 MG PO TABS
650.0000 mg | ORAL_TABLET | Freq: Once | ORAL | Status: AC
  Administered 2018-07-02: 650 mg via ORAL

## 2018-07-02 MED ORDER — DIPHENHYDRAMINE HCL 25 MG PO CAPS
ORAL_CAPSULE | ORAL | Status: AC
  Filled 2018-07-02: qty 1

## 2018-07-02 MED ORDER — ACETAMINOPHEN 325 MG PO TABS
ORAL_TABLET | ORAL | Status: AC
  Filled 2018-07-02: qty 2

## 2018-07-02 MED ORDER — TRASTUZUMAB CHEMO 150 MG IV SOLR
450.0000 mg | Freq: Once | INTRAVENOUS | Status: AC
  Administered 2018-07-02: 450 mg via INTRAVENOUS
  Filled 2018-07-02: qty 21.43

## 2018-07-02 MED ORDER — HEPARIN SOD (PORK) LOCK FLUSH 100 UNIT/ML IV SOLN
500.0000 [IU] | Freq: Once | INTRAVENOUS | Status: AC | PRN
  Administered 2018-07-02: 500 [IU]
  Filled 2018-07-02: qty 5

## 2018-07-22 NOTE — Progress Notes (Signed)
[place]  [phone] [phone]     ID: April [name] DOB: 1951-02-04  MR#: [id number]  HBZ#:[id number]  Patient Care Team: Kelton Pillar, MD as PCP - General (Family Medicine) Zakari Couchman, Virgie Dad, MD as Consulting Physician (Oncology) Rolm Bookbinder, MD as Consulting Physician (General Surgery) Eppie Gibson, MD as Attending Physician (Radiation Oncology) Newt Minion, MD as Consulting Physician (Orthopedic Surgery) Regal, Tamala Fothergill, DPM as Consulting Physician (Podiatry) Neldon Mc, Donnamarie Poag, MD as Consulting Physician (Allergy and Immunology) OTHER MD:  CHIEF COMPLAINT: Estrogen receptor negative breast cancer  CURRENT TREATMENT: Completing a year of trastuzumab   HISTORY OF CURRENT ILLNESS: From the original intake note:  "April [name]" had bilateral screening mammography at [facility] 06/06/2017.  This showed a possible mass in the right breast at the 11 o'clock position.  On 06/13/2017 she underwent right diagnostic mammography and ultrasonography.  Breast density was category C.  In the right breast at the 11 o'clock position there was a 1 cm area by mammography.  By ultrasound this confirmed a 1.0 cm irregular mass with lobulated margins in the upper outer quadrant of the right breast.  There was a second, 0.4 cm lobulated mass in the same quadrant.  The right axilla was sonographically benign.  On 06/20/2017 biopsy of the 2 right breast masses in question was performed.  The final pathology (SAA [id number]) found the smaller mass to be only fibrocystic change.  This is felt to be concordant.  The larger mass however was an invasive ductal carcinoma, grade 3, estrogen and progesterone receptor negative, with an MIB-1 of 30%, and HER-2 amplified, with a signals ratio of 2.24.  The number per cell was 4.60.  The patient's subsequent history is as detailed below.  INTERVAL HISTORY: April [name] returns today for follow-up and treatment of her estrogen  receptor negative breast cancer. She is accompanied by her husband.  She continues on trastuzumab, with her last dose due today. She tolerates this well and without any noticeable side effects.    Her most recent echocardiogram was 05/28/2018 with an ejection fraction in the 65-70% range.  Since her last visit here, she underwent a 3D digital diagnostic mammogram at [facility] on 06/07/2018 showing Breast Density Category C. There is no mammographic evidence for malignancy.   REVIEW OF SYSTEMS: April [name] has been going to the [facility] for exercise three times per week; she goes to yoga, line dancing class, and cardio class. She has also been attending breast cancer support classes and she is interested in attending the 'Finding Your New Normal' class. This week, she will travel to [place] to cheer her grandson on in his basketball district championship game. The patient denies unusual headaches, visual changes, nausea, vomiting, or dizziness. There has been no unusual cough, phlegm production, or pleurisy. This been no change in bowel or bladder habits. The patient denies unexplained fatigue or unexplained weight loss, bleeding, rash, or fever. A detailed review of systems was otherwise noncontributory.    PAST MEDICAL HISTORY: Past Medical History:  Diagnosis Date  . Breast cancer ([place]) 05/2017   right breast  . Eczema   . Family history of breast cancer   . Family history of ovarian cancer   . History of radiation therapy 10/17/17- 11/14/17   40.05 directed to the right breast in 15 fractions, followed by a boost of 10 gy given in 5 fractions.   . Hypertension    toxemia during pregnancy, no meds now    PAST SURGICAL HISTORY:  Past Surgical History:  Procedure Laterality Date  . ABDOMINAL HYSTERECTOMY    . BREAST LUMPECTOMY WITH RADIOACTIVE SEED AND SENTINEL LYMPH NODE BIOPSY Right 07/17/2017   Procedure: BREAST LUMPECTOMY WITH RADIOACTIVE SEED AND SENTINEL LYMPH NODE BIOPSY;  Surgeon: Rolm Bookbinder, MD;  Location: [place];  Service: General;  Laterality: Right;  . CESAREAN SECTION     x4  . DILATION AND CURETTAGE OF UTERUS    . KNEE SURGERY Right   . PORTACATH PLACEMENT N/A 07/17/2017   Procedure: INSERTION PORT-A-CATH WITH [place];  Surgeon: Rolm Bookbinder, MD;  Location: [place];  Service: General;  Laterality: N/A;    FAMILY HISTORY Family History  Problem Relation Age of Onset  . Breast cancer Mother 29       again at 2 in other breast   . Heart attack Father 60  . Breast cancer Sister 38  . Breast cancer Maternal Grandmother 56       spread to lungs, died at 42  . Ovarian cancer Cousin 53  . Prostate cancer Cousin    The patient's father died at age 67 from a heart attack.  The patient's mother is currently living at age 40 (as of January 2019).  The patient had 1 sister who was diagnosed with breast cancer at age 5 and died from metastatic disease at age 68.  The patient has 1 brother.  In addition the patient's mother was diagnosed with breast cancer at age 60, on the left side, and now has a right-sided breast cancer diagnosed in January 2019.  There is in addition a cousin with ovarian cancer diagnosed when she was 69 years old   GYNECOLOGIC HISTORY:  No LMP recorded. Patient has had a hysterectomy. Menarche age 52, first live birth age 75, the patient had 3 live births, 1 of whom survived only 2 days.  She underwent hysterectomy without salpingo-oophorectomy September 05, 1978.  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  She is now retired.  Her husband [name] his Theme park manager at [place].  The patient's daughter [name] lives in [place] where she is an Tourist information centre manager.  The patient's daughter [name] lives in [place] working for the department of defense.  The patient has 3  grandchildren, 1 of whom is in the seventh grade but is actually the captain of the eighth grade basketball team and is playing in a championship February 2020.   ADVANCED DIRECTIVES: Not in place   HEALTH MAINTENANCE: Social History   Tobacco Use  . Smoking status: Never Smoker  . Smokeless tobacco: Never Used  Substance Use Topics  . Alcohol use: Yes    Comment: social  . Drug use: No     Colonoscopy: April 2018/[facility]  PAP: Status post hysterectomy  Bone density:   Allergies  Allergen Reactions  . Tape Itching and Rash  . Nsaids   . Antihistamines, Diphenhydramine-Type Other (See Comments)    Pt stated she gets lump in throat and stomach when taking histamines.   . Sulfa Antibiotics Rash    Current Outpatient Medications  Medication Sig Dispense Refill  . aspirin 81 MG chewable tablet Chew 81 mg by mouth daily.    Marland Kitchen CALCIUM-VITAMIN D PO Take 1 tablet by mouth daily.    . Coenzyme Q10 (COQ10 PO) Take by mouth every other day.     Marland Kitchen  hydrochlorothiazide (MICROZIDE) 12.5 MG capsule Take 1 capsule (12.5 mg total) by mouth daily. 90 capsule 3  . lidocaine-prilocaine (EMLA) cream Apply 1 application topically as needed. 30 g 0  . losartan (COZAAR) 100 MG tablet Take 1.5 tablets (150 mg total) by mouth daily. 140 tablet 1  . pravastatin (PRAVACHOL) 80 MG tablet Take 1 tablet (80 mg total) by mouth daily at 6 PM. (Patient taking differently: Take 80 mg by mouth every other day. ) 60 tablet 0  . vitamin B-12 (CYANOCOBALAMIN) 1000 MCG tablet Take 1,000 mcg by mouth daily.     No current facility-administered medications for this visit.     OBJECTIVE: Middle-aged African-American woman who appears well Vitals:   07/23/18 1150  BP: (!) 145/84  Pulse: 72  Resp: 18  Temp: 98.4 F (36.9 C)  SpO2: 100%     Body mass index is 26.5 kg/m.    Wt Readings from Last 3 Encounters:  07/23/18 149 lb 9.6 oz (67.9 kg)  05/28/18 150 lb 9.6 oz (68.3 kg)  04/30/18 147 lb 1.6 oz (66.7  kg)  ECOG FS:0 - Asymptomatic   Sclerae unicteric, EOMs intact No cervical or supraclavicular adenopathy Lungs no rales or rhonchi Heart regular rate and rhythm Abd soft, nontender, positive bowel sounds MSK no focal spinal tenderness, no upper extremity lymphedema Neuro: nonfocal, well oriented, appropriate affect Breasts: Status post right-sided lumpectomy and radiation, with mild residual hyperpigmentation but an excellent cosmetic result and no evidence of disease recurrence.  The left breast is benign.  Both axillae are benign.  LAB RESULTS:  CMP     Component Value Date/Time   NA 141 07/23/2018 1119   K 3.8 07/23/2018 1119   CL 102 07/23/2018 1119   CO2 30 07/23/2018 1119   GLUCOSE 97 07/23/2018 1119   BUN 14 07/23/2018 1119   CREATININE 1.06 (H) 07/23/2018 1119   CREATININE 1.01 06/27/2017 0843   CALCIUM 10.0 07/23/2018 1119   PROT 7.5 07/23/2018 1119   ALBUMIN 4.1 07/23/2018 1119   AST 22 07/23/2018 1119   AST 16 06/27/2017 0843   ALT 16 07/23/2018 1119   ALT 14 06/27/2017 0843   ALKPHOS 80 07/23/2018 1119   BILITOT 0.6 07/23/2018 1119   BILITOT 0.2 06/27/2017 0843   GFRNONAA 54 (L) 07/23/2018 1119   GFRNONAA 56 (L) 06/27/2017 0843   GFRAA >60 07/23/2018 1119   GFRAA >60 06/27/2017 0843    No results found for: TOTALPROTELP, ALBUMINELP, A1GS, A2GS, BETS, BETA2SER, GAMS, MSPIKE, SPEI  No results found for: KPAFRELGTCHN, LAMBDASER, KAPLAMBRATIO  Lab Results  Component Value Date   WBC 4.2 07/23/2018   NEUTROABS 2.0 07/23/2018   HGB 10.7 (L) 07/23/2018   HCT 32.3 (L) 07/23/2018   MCV 87.5 07/23/2018   PLT 251 07/23/2018    '@LASTCHEMISTRY' @  No results found for: LABCA2  No components found for: UMPNTI144  No results for input(s): INR in the last 168 hours.  No results found for: LABCA2  No results found for: RXV400  No results found for: QQP619  No results found for: JKD326  No results found for: CA2729  No components found for:  HGQUANT  No results found for: CEA1 / No results found for: CEA1   No results found for: AFPTUMOR  No results found for: CHROMOGRNA  No results found for: PSA1  Appointment on 07/23/2018  Component Date Value Ref Range Status  . Sodium 07/23/2018 141  135 - 145 mmol/L Final  . Potassium 07/23/2018 3.8  3.5 - 5.1 mmol/L Final  . Chloride 07/23/2018 102  98 - 111 mmol/L Final  . CO2 07/23/2018 30  22 - 32 mmol/L Final  . Glucose, Bld 07/23/2018 97  70 - 99 mg/dL Final  . BUN 07/23/2018 14  8 - 23 mg/dL Final  . Creatinine, Ser 07/23/2018 1.06* 0.44 - 1.00 mg/dL Final  . Calcium 07/23/2018 10.0  8.9 - 10.3 mg/dL Final  . Total Protein 07/23/2018 7.5  6.5 - 8.1 g/dL Final  . Albumin 07/23/2018 4.1  3.5 - 5.0 g/dL Final  . AST 07/23/2018 22  15 - 41 U/L Final  . ALT 07/23/2018 16  0 - 44 U/L Final  . Alkaline Phosphatase 07/23/2018 80  38 - 126 U/L Final  . Total Bilirubin 07/23/2018 0.6  0.3 - 1.2 mg/dL Final  . GFR calc non Af Amer 07/23/2018 54* >60 mL/min Final  . GFR calc Af Amer 07/23/2018 >60  >60 mL/min Final  . Anion gap 07/23/2018 9  5 - 15 Final   Performed at [facility], [place] [address], [place], [place] [address]  . WBC 07/23/2018 4.2  4.0 - 10.5 K/uL Final  . RBC 07/23/2018 3.69* 3.87 - 5.11 MIL/uL Final  . Hemoglobin 07/23/2018 10.7* 12.0 - 15.0 g/dL Final  . HCT 07/23/2018 32.3* 36.0 - 46.0 % Final  . MCV 07/23/2018 87.5  80.0 - 100.0 fL Final  . MCH 07/23/2018 29.0  26.0 - 34.0 pg Final  . MCHC 07/23/2018 33.1  30.0 - 36.0 g/dL Final  . RDW 07/23/2018 13.2  11.5 - 15.5 % Final  . Platelets 07/23/2018 251  150 - 400 K/uL Final  . nRBC 07/23/2018 0.0  0.0 - 0.2 % Final  . Neutrophils Relative % 07/23/2018 48  % Final  . Neutro Abs 07/23/2018 2.0  1.7 - 7.7 K/uL Final  . Lymphocytes Relative 07/23/2018 38  % Final  . Lymphs Abs 07/23/2018 1.6  0.7 - 4.0 K/uL Final  . Monocytes Relative 07/23/2018 10  % Final  . Monocytes Absolute  07/23/2018 0.4  0.1 - 1.0 K/uL Final  . Eosinophils Relative 07/23/2018 3  % Final  . Eosinophils Absolute 07/23/2018 0.1  0.0 - 0.5 K/uL Final  . Basophils Relative 07/23/2018 1  % Final  . Basophils Absolute 07/23/2018 0.0  0.0 - 0.1 K/uL Final  . Immature Granulocytes 07/23/2018 0  % Final  . Abs Immature Granulocytes 07/23/2018 0.01  0.00 - 0.07 K/uL Final   Performed at [facility], [place] [address] [place],  [address]    (this displays the last labs from the last 3 days)  No results found for: TOTALPROTELP, ALBUMINELP, A1GS, A2GS, BETS, BETA2SER, GAMS, MSPIKE, SPEI (this displays SPEP labs)  No results found for: KPAFRELGTCHN, LAMBDASER, KAPLAMBRATIO (kappa/lambda light chains)  No results found for: HGBA, HGBA2QUANT, HGBFQUANT, HGBSQUAN (Hemoglobinopathy evaluation)   No results found for: LDH  Lab Results  Component Value Date   IRON 77 05/21/2018   TIBC 404 05/21/2018   IRONPCTSAT 19 (L) 05/21/2018   (Iron and TIBC)  Lab Results  Component Value Date   FERRITIN 24 05/21/2018    Urinalysis    Component Value Date/Time   COLORURINE COLORLESS (A) 08/09/2017 [place] 08/09/2017 2224   LABSPEC 1.004 (L) 08/09/2017 2224   PHURINE 6.0 08/09/2017 [place] 08/09/2017 [place] NEGATIVE 08/09/2017 [place] 08/09/2017 2224  KETONESUR NEGATIVE 08/09/2017 2224   PROTEINUR NEGATIVE 08/09/2017 2224   NITRITE NEGATIVE 08/09/2017 2224   LEUKOCYTESUR NEGATIVE 08/09/2017 2224     STUDIES: Mammography 06/07/2018 at [facility] shows no evidence of disease recurrence  ELIGIBLE FOR AVAILABLE RESEARCH PROTOCOL: no   ASSESSMENT: 68 y.o. [place] woman status post right breast upper outer quadrant biopsy 06/20/2017 for a clinical T1b N0, stage IA invasive ductal carcinoma, grade 3, estrogen and progesterone receptor negative, but HER-2 amplified, with an MIB-1 of 30%  (1)  genetics testing 07/05/2017 through the Common Hereditary Cancer Panel offered by [facility] found no deleterious mutations in APC, ATM, AXIN2, BARD1, BMPR1A, BRCA1, BRCA2, BRIP1, CDH1, CDKN2A (p14ARF), CDKN2A (p16INK4a), CKD4, CHEK2, CTNNA1, DICER1, EPCAM (Deletion/duplication testing only), GREM1 (promoter region deletion/duplication testing only), KIT, MEN1, MLH1, MSH2, MSH3, MSH6, MUTYH, NBN, NF1, NHTL1, PALB2, PDGFRA, PMS2, POLD1, POLE, PTEN, RAD50, RAD51C, RAD51D, SDHB, SDHC, SDHD, SMAD4, SMARCA4. STK11, TP53, TSC1, TSC2, and VHL.  The following genes were evaluated for sequence changes only: SDHA and HOXB13 c.251G>A variant only.  (a) a Variant of uncertain significance in MSH2 was identified c.1331G>T (p.Arg444Leu).   (2) status post right lumpectomy and sentinel lymph node sampling 07/17/2017 for a pT1c pN0, stage IA invasive ductal carcinoma, grade 2, with negative margins.  A total of 5 lymph nodes were removed  (3) adjuvant chemotherapy consisting of paclitaxel weekly x12 together with trastuzumab every 21 days starting 08/07/2017  (a) paclitaxel stopped after 8 doses because of neuropathy (last dose 09/25/2017  (4) trastuzumab continued to total 1 year (last dose 07/23/2018)  (a) echo 08/10/2017 showed an ejection fraction in the 65-70% range  (b) echo on 11/01/2017 shows EF of 60-65%  (c) cardiogram 02/05/2018 shows an ejection fraction in the 60-65% range.  (d) echocardiogram 05/28/2018 shows an ejection fraction in the 65-70%.  (5) adjuvant radiation 10/17/2017-11/14/2016: 40.05 Gy directed to the Right Breast in 15 fractions, followed by a boost of 10 Gy given in 5 fractions   (6) anemia, with normal MCV, ferritin, B12, folate, and inappropriately normal reticulocyte count, consistent with anemia of chronic illness.   PLAN: April [name] completes a year of trastuzumab today.  She has tolerated it well and there has been no change in her excellent ejection fraction.  Her mother, who is now  in her late 32s, is on letrozole for recurrent breast cancer (or possibly a second breast cancer).  April [name] had some interest in taking letrozole as well.    We discussed the fact that for this cancer, which is estrogen receptor negative, letrozole would not be useful.  She could take it prophylactically and we discussed her risk of developing another breast cancer and the fact that if she took letrozole or a similar drug for 5years that risk would be cut in half.  We then discussed the possible toxicities side effects and complications of this agent.  After all this discussion she is not motivated to take antiestrogens prophylactically and I am comfortable with that decision.  She will see Dr. Donne Hazel soon to have her port removed.  She will see Dr. Laurann Montana her primary care physician in May.  She will see me again in January, after her December mammogram this year  The plan is for yearly follow-up until she completes 5 years  She knows to call for any other issue that may develop before the next visit here.  Efraim Vanallen, Virgie Dad, MD  07/23/18 12:17 PM Medical Oncology and Hematology [facility] [address]  [place],  [address] Tel. [phone]    Fax. [phone]  I, Jacqualyn Posey am acting as a Education administrator for Chauncey Cruel, MD.   I, Lurline Del MD, have reviewed the above documentation for accuracy and completeness, and I agree with the above.

## 2018-07-23 ENCOUNTER — Telehealth: Payer: Self-pay | Admitting: Oncology

## 2018-07-23 ENCOUNTER — Inpatient Hospital Stay: Payer: Medicare Other

## 2018-07-23 ENCOUNTER — Inpatient Hospital Stay (HOSPITAL_BASED_OUTPATIENT_CLINIC_OR_DEPARTMENT_OTHER): Payer: Medicare Other | Admitting: Oncology

## 2018-07-23 ENCOUNTER — Inpatient Hospital Stay: Payer: Medicare Other | Attending: Oncology

## 2018-07-23 VITALS — BP 145/84 | HR 72 | Temp 98.4°F | Resp 18 | Ht 63.0 in | Wt 149.6 lb

## 2018-07-23 VITALS — BP 135/76 | HR 102 | Temp 98.9°F | Resp 18

## 2018-07-23 DIAGNOSIS — C50411 Malignant neoplasm of upper-outer quadrant of right female breast: Secondary | ICD-10-CM | POA: Diagnosis present

## 2018-07-23 DIAGNOSIS — Z803 Family history of malignant neoplasm of breast: Secondary | ICD-10-CM

## 2018-07-23 DIAGNOSIS — Z171 Estrogen receptor negative status [ER-]: Secondary | ICD-10-CM | POA: Insufficient documentation

## 2018-07-23 DIAGNOSIS — G620 Drug-induced polyneuropathy: Secondary | ICD-10-CM | POA: Insufficient documentation

## 2018-07-23 DIAGNOSIS — T451X5A Adverse effect of antineoplastic and immunosuppressive drugs, initial encounter: Secondary | ICD-10-CM

## 2018-07-23 DIAGNOSIS — Z5112 Encounter for antineoplastic immunotherapy: Secondary | ICD-10-CM | POA: Diagnosis not present

## 2018-07-23 LAB — CBC WITH DIFFERENTIAL/PLATELET
Abs Immature Granulocytes: 0.01 10*3/uL (ref 0.00–0.07)
Basophils Absolute: 0 10*3/uL (ref 0.0–0.1)
Basophils Relative: 1 %
Eosinophils Absolute: 0.1 10*3/uL (ref 0.0–0.5)
Eosinophils Relative: 3 %
HCT: 32.3 % — ABNORMAL LOW (ref 36.0–46.0)
Hemoglobin: 10.7 g/dL — ABNORMAL LOW (ref 12.0–15.0)
Immature Granulocytes: 0 %
Lymphocytes Relative: 38 %
Lymphs Abs: 1.6 10*3/uL (ref 0.7–4.0)
MCH: 29 pg (ref 26.0–34.0)
MCHC: 33.1 g/dL (ref 30.0–36.0)
MCV: 87.5 fL (ref 80.0–100.0)
Monocytes Absolute: 0.4 10*3/uL (ref 0.1–1.0)
Monocytes Relative: 10 %
Neutro Abs: 2 10*3/uL (ref 1.7–7.7)
Neutrophils Relative %: 48 %
Platelets: 251 10*3/uL (ref 150–400)
RBC: 3.69 MIL/uL — ABNORMAL LOW (ref 3.87–5.11)
RDW: 13.2 % (ref 11.5–15.5)
WBC: 4.2 10*3/uL (ref 4.0–10.5)
nRBC: 0 % (ref 0.0–0.2)

## 2018-07-23 LAB — COMPREHENSIVE METABOLIC PANEL
ALT: 16 U/L (ref 0–44)
AST: 22 U/L (ref 15–41)
Albumin: 4.1 g/dL (ref 3.5–5.0)
Alkaline Phosphatase: 80 U/L (ref 38–126)
Anion gap: 9 (ref 5–15)
BUN: 14 mg/dL (ref 8–23)
CO2: 30 mmol/L (ref 22–32)
Calcium: 10 mg/dL (ref 8.9–10.3)
Chloride: 102 mmol/L (ref 98–111)
Creatinine, Ser: 1.06 mg/dL — ABNORMAL HIGH (ref 0.44–1.00)
GFR calc Af Amer: >60 mL/min (ref >60)
GFR calc non Af Amer: 54 mL/min — ABNORMAL LOW (ref >60)
Glucose, Bld: 97 mg/dL (ref 70–99)
Potassium: 3.8 mmol/L (ref 3.5–5.1)
Sodium: 141 mmol/L (ref 135–145)
Total Bilirubin: 0.6 mg/dL (ref 0.3–1.2)
Total Protein: 7.5 g/dL (ref 6.5–8.1)

## 2018-07-23 MED ORDER — ACETAMINOPHEN 325 MG PO TABS
650.0000 mg | ORAL_TABLET | Freq: Once | ORAL | Status: AC
  Administered 2018-07-23: 650 mg via ORAL

## 2018-07-23 MED ORDER — DIPHENHYDRAMINE HCL 25 MG PO CAPS
ORAL_CAPSULE | ORAL | Status: AC
  Filled 2018-07-23: qty 1

## 2018-07-23 MED ORDER — TRASTUZUMAB CHEMO 150 MG IV SOLR
450.0000 mg | Freq: Once | INTRAVENOUS | Status: AC
  Administered 2018-07-23: 450 mg via INTRAVENOUS
  Filled 2018-07-23: qty 21.43

## 2018-07-23 MED ORDER — HEPARIN SOD (PORK) LOCK FLUSH 100 UNIT/ML IV SOLN
500.0000 [IU] | Freq: Once | INTRAVENOUS | Status: AC | PRN
  Administered 2018-07-23: 500 [IU]
  Filled 2018-07-23: qty 5

## 2018-07-23 MED ORDER — DIPHENHYDRAMINE HCL 25 MG PO CAPS
25.0000 mg | ORAL_CAPSULE | Freq: Once | ORAL | Status: AC
  Administered 2018-07-23: 25 mg via ORAL

## 2018-07-23 MED ORDER — SODIUM CHLORIDE 0.9 % IV SOLN
Freq: Once | INTRAVENOUS | Status: AC
  Administered 2018-07-23: 13:00:00 via INTRAVENOUS
  Filled 2018-07-23: qty 250

## 2018-07-23 MED ORDER — ACETAMINOPHEN 325 MG PO TABS
ORAL_TABLET | ORAL | Status: AC
  Filled 2018-07-23: qty 2

## 2018-07-23 MED ORDER — SODIUM CHLORIDE 0.9% FLUSH
10.0000 mL | INTRAVENOUS | Status: DC | PRN
  Administered 2018-07-23: 10 mL
  Filled 2018-07-23: qty 10

## 2018-07-23 NOTE — Telephone Encounter (Signed)
Gave avs and calendar ° °

## 2018-07-23 NOTE — Patient Instructions (Signed)
[facility] Discharge Instructions for Patients Receiving Chemotherapy  Today you received the following chemotherapy agents trastuzumab (Herceptin)  To help prevent nausea and vomiting after your treatment, we encourage you to take your nausea medication as directed  If you develop nausea and vomiting that is not controlled by your nausea medication, call the clinic.   BELOW ARE SYMPTOMS THAT SHOULD BE REPORTED IMMEDIATELY:  *FEVER GREATER THAN 100.5 F  *CHILLS WITH OR WITHOUT FEVER  NAUSEA AND VOMITING THAT IS NOT CONTROLLED WITH YOUR NAUSEA MEDICATION  *UNUSUAL SHORTNESS OF BREATH  *UNUSUAL BRUISING OR BLEEDING  TENDERNESS IN MOUTH AND THROAT WITH OR WITHOUT PRESENCE OF ULCERS  *URINARY PROBLEMS  *BOWEL PROBLEMS  UNUSUAL RASH Items with * indicate a potential emergency and should be followed up as soon as possible.  Feel free to call the clinic should you have any questions or concerns. The clinic phone number is [phone].  Please show the CHEMO ALERT CARD at check-in to the Emergency Department and triage nurse.   

## 2018-07-30 ENCOUNTER — Telehealth: Payer: Self-pay | Admitting: Adult Health

## 2018-07-30 NOTE — Telephone Encounter (Signed)
Scheduled appt per 2/10 sch message - sent reminder letter in the mail with appt date and time -

## 2018-09-03 ENCOUNTER — Other Ambulatory Visit: Payer: Self-pay | Admitting: General Surgery

## 2018-09-03 ENCOUNTER — Encounter (HOSPITAL_BASED_OUTPATIENT_CLINIC_OR_DEPARTMENT_OTHER): Payer: Self-pay | Admitting: *Deleted

## 2018-09-03 ENCOUNTER — Other Ambulatory Visit: Payer: Self-pay

## 2018-09-04 ENCOUNTER — Encounter (HOSPITAL_BASED_OUTPATIENT_CLINIC_OR_DEPARTMENT_OTHER): Payer: Self-pay

## 2018-09-04 ENCOUNTER — Ambulatory Visit (HOSPITAL_BASED_OUTPATIENT_CLINIC_OR_DEPARTMENT_OTHER): Payer: Medicare Other | Admitting: Anesthesiology

## 2018-09-04 ENCOUNTER — Ambulatory Visit (HOSPITAL_BASED_OUTPATIENT_CLINIC_OR_DEPARTMENT_OTHER)
Admission: RE | Admit: 2018-09-04 | Discharge: 2018-09-04 | Disposition: A | Discharge status: Home / Self Care | Payer: Medicare Other | Attending: General Surgery | Admitting: General Surgery

## 2018-09-04 ENCOUNTER — Encounter (HOSPITAL_BASED_OUTPATIENT_CLINIC_OR_DEPARTMENT_OTHER)
Admission: RE | Disposition: A | Discharge status: Home / Self Care | Payer: Self-pay | Source: Home / Self Care | Attending: General Surgery

## 2018-09-04 ENCOUNTER — Other Ambulatory Visit: Payer: Self-pay

## 2018-09-04 DIAGNOSIS — Z8041 Family history of malignant neoplasm of ovary: Secondary | ICD-10-CM | POA: Diagnosis not present

## 2018-09-04 DIAGNOSIS — Z9221 Personal history of antineoplastic chemotherapy: Secondary | ICD-10-CM | POA: Insufficient documentation

## 2018-09-04 DIAGNOSIS — Z853 Personal history of malignant neoplasm of breast: Secondary | ICD-10-CM | POA: Diagnosis not present

## 2018-09-04 DIAGNOSIS — Z923 Personal history of irradiation: Secondary | ICD-10-CM | POA: Insufficient documentation

## 2018-09-04 DIAGNOSIS — Z803 Family history of malignant neoplasm of breast: Secondary | ICD-10-CM | POA: Insufficient documentation

## 2018-09-04 DIAGNOSIS — Z882 Allergy status to sulfonamides status: Secondary | ICD-10-CM | POA: Insufficient documentation

## 2018-09-04 DIAGNOSIS — Z79899 Other long term (current) drug therapy: Secondary | ICD-10-CM | POA: Diagnosis not present

## 2018-09-04 DIAGNOSIS — Z7982 Long term (current) use of aspirin: Secondary | ICD-10-CM | POA: Insufficient documentation

## 2018-09-04 DIAGNOSIS — Z8249 Family history of ischemic heart disease and other diseases of the circulatory system: Secondary | ICD-10-CM | POA: Insufficient documentation

## 2018-09-04 DIAGNOSIS — I10 Essential (primary) hypertension: Secondary | ICD-10-CM | POA: Diagnosis not present

## 2018-09-04 DIAGNOSIS — Z886 Allergy status to analgesic agent status: Secondary | ICD-10-CM | POA: Diagnosis not present

## 2018-09-04 DIAGNOSIS — Z452 Encounter for adjustment and management of vascular access device: Secondary | ICD-10-CM | POA: Insufficient documentation

## 2018-09-04 HISTORY — PX: PORT-A-CATH REMOVAL: SHX5289

## 2018-09-04 SURGERY — REMOVAL PORT-A-CATH
Anesthesia: Monitor Anesthesia Care | Site: Chest | Laterality: Right

## 2018-09-04 MED ORDER — CHLORHEXIDINE GLUCONATE CLOTH 2 % EX PADS: 6.0000 | MEDICATED_PAD | Freq: Once | CUTANEOUS | Status: DC

## 2018-09-04 MED ORDER — LIDOCAINE HCL (CARDIAC) PF 100 MG/5ML IV SOSY
PREFILLED_SYRINGE | INTRAVENOUS | Status: DC | PRN
  Administered 2018-09-04: 30 mg via INTRAVENOUS

## 2018-09-04 MED ORDER — SCOPOLAMINE 1 MG/3DAYS TD PT72: 1.0000 | MEDICATED_PATCH | Freq: Once | TRANSDERMAL | Status: DC | PRN

## 2018-09-04 MED ORDER — FENTANYL CITRATE (PF) 100 MCG/2ML IJ SOLN
INTRAMUSCULAR | Status: AC
  Filled 2018-09-04: qty 2

## 2018-09-04 MED ORDER — PROPOFOL 500 MG/50ML IV EMUL
INTRAVENOUS | Status: DC | PRN
  Administered 2018-09-04: 75 ug/kg/min via INTRAVENOUS

## 2018-09-04 MED ORDER — ACETAMINOPHEN 500 MG PO TABS: 1000.0000 mg | ORAL_TABLET | ORAL | Status: DC

## 2018-09-04 MED ORDER — ACETAMINOPHEN 325 MG PO TABS: 325.0000 mg | ORAL_TABLET | Freq: Once | ORAL | Status: DC

## 2018-09-04 MED ORDER — FENTANYL CITRATE (PF) 100 MCG/2ML IJ SOLN: 25.0000 ug | INTRAMUSCULAR | Status: DC | PRN

## 2018-09-04 MED ORDER — MEPERIDINE HCL 25 MG/ML IJ SOLN: 6.2500 mg | INTRAMUSCULAR | Status: DC | PRN

## 2018-09-04 MED ORDER — LIDOCAINE-EPINEPHRINE (PF) 1 %-1:200000 IJ SOLN
INTRAMUSCULAR | Status: DC | PRN
  Administered 2018-09-04: 5 mL

## 2018-09-04 MED ORDER — ACETAMINOPHEN 160 MG/5ML PO SOLN: 325.0000 mg | Freq: Once | ORAL | Status: DC

## 2018-09-04 MED ORDER — OXYCODONE HCL 5 MG/5ML PO SOLN: 5.0000 mg | Freq: Once | ORAL | Status: DC | PRN

## 2018-09-04 MED ORDER — ACETAMINOPHEN 10 MG/ML IV SOLN: 1000.0000 mg | Freq: Once | INTRAVENOUS | Status: DC | PRN

## 2018-09-04 MED ORDER — PROMETHAZINE HCL 25 MG/ML IJ SOLN: 6.2500 mg | INTRAMUSCULAR | Status: DC | PRN

## 2018-09-04 MED ORDER — LACTATED RINGERS IV SOLN
INTRAVENOUS | Status: DC
  Administered 2018-09-04: 12:00:00 via INTRAVENOUS

## 2018-09-04 MED ORDER — OXYCODONE HCL 5 MG PO TABS: 5.0000 mg | ORAL_TABLET | Freq: Once | ORAL | Status: DC | PRN

## 2018-09-04 MED ORDER — MIDAZOLAM HCL 2 MG/2ML IJ SOLN: 1.0000 mg | INTRAMUSCULAR | Status: DC | PRN

## 2018-09-04 MED ORDER — ONDANSETRON HCL 4 MG/2ML IJ SOLN
INTRAMUSCULAR | Status: DC | PRN
  Administered 2018-09-04: 4 mg via INTRAVENOUS

## 2018-09-04 MED ORDER — FENTANYL CITRATE (PF) 100 MCG/2ML IJ SOLN
50.0000 ug | INTRAMUSCULAR | Status: DC | PRN
  Administered 2018-09-04 (×2): 50 ug via INTRAVENOUS

## 2018-09-04 MED ORDER — LIDOCAINE-EPINEPHRINE 1 %-1:100000 IJ SOLN
INTRAMUSCULAR | Status: AC
  Filled 2018-09-04: qty 1

## 2018-09-04 MED ORDER — LACTATED RINGERS IV SOLN: INTRAVENOUS | Status: DC

## 2018-09-04 MED ORDER — BUPIVACAINE HCL (PF) 0.25 % IJ SOLN
INTRAMUSCULAR | Status: DC | PRN
  Administered 2018-09-04: 5 mL

## 2018-09-04 SURGICAL SUPPLY — 29 items
ADH SKN CLS APL DERMABOND .7 (GAUZE/BANDAGES/DRESSINGS) ×1
APL PRP STRL LF DISP 70% ISPRP (MISCELLANEOUS) ×1
BLADE SURG 15 STRL LF DISP TIS (BLADE) ×1 IMPLANT
BLADE SURG 15 STRL SS (BLADE) ×2
CHLORAPREP W/TINT 26 (MISCELLANEOUS) ×2 IMPLANT
COVER BACK TABLE 60X90IN (DRAPES) ×2 IMPLANT
COVER MAYO STAND STRL (DRAPES) ×2 IMPLANT
COVER WAND RF STERILE (DRAPES) IMPLANT
DECANTER SPIKE VIAL GLASS SM (MISCELLANEOUS) ×2 IMPLANT
DERMABOND ADVANCED (GAUZE/BANDAGES/DRESSINGS) ×1
DERMABOND ADVANCED .7 DNX12 (GAUZE/BANDAGES/DRESSINGS) ×1 IMPLANT
DRAPE LAPAROTOMY 100X72 PEDS (DRAPES) ×2 IMPLANT
DRAPE UTILITY XL STRL (DRAPES) ×2 IMPLANT
ELECT COATED BLADE 2.86 ST (ELECTRODE) ×2 IMPLANT
ELECT REM PT RETURN 9FT ADLT (ELECTROSURGICAL) ×2
ELECTRODE REM PT RTRN 9FT ADLT (ELECTROSURGICAL) ×1 IMPLANT
GLOVE BIO SURGEON STRL SZ7 (GLOVE) ×2 IMPLANT
GLOVE BIOGEL PI IND STRL 7.5 (GLOVE) ×2 IMPLANT
GLOVE BIOGEL PI INDICATOR 7.5 (GLOVE) ×2
GOWN STRL REUS W/ TWL LRG LVL3 (GOWN DISPOSABLE) ×2 IMPLANT
GOWN STRL REUS W/TWL LRG LVL3 (GOWN DISPOSABLE) ×4
NEEDLE HYPO 25X1 1.5 SAFETY (NEEDLE) ×2 IMPLANT
PACK BASIN DAY SURGERY FS (CUSTOM PROCEDURE TRAY) ×2 IMPLANT
PENCIL BUTTON HOLSTER BLD 10FT (ELECTRODE) ×2 IMPLANT
SLEEVE SCD COMPRESS KNEE MED (MISCELLANEOUS) ×2 IMPLANT
SUT MNCRL AB 4-0 PS2 18 (SUTURE) ×2 IMPLANT
SUT VIC AB 3-0 SH 27 (SUTURE) ×2
SUT VIC AB 3-0 SH 27X BRD (SUTURE) ×1 IMPLANT
SYR CONTROL 10ML LL (SYRINGE) ×2 IMPLANT

## 2018-09-04 NOTE — Anesthesia Preprocedure Evaluation (Addendum)
Anesthesia Evaluation  Patient identified by MRN, date of birth, ID band Patient awake    Reviewed: Allergy & Precautions, NPO status , Patient's Chart, lab work & pertinent test results  Airway Mallampati: II  TM Distance: >3 FB Neck ROM: Full    Dental  (+) Teeth Intact, Dental Advisory Given   Pulmonary neg pulmonary ROS,    breath sounds clear to auscultation       Cardiovascular hypertension, Pt. on medications  Rhythm:Regular Rate:Normal     Neuro/Psych TIAnegative psych ROS   GI/Hepatic negative GI ROS, Neg liver ROS,   Endo/Other  negative endocrine ROS  Renal/GU negative Renal ROS     Musculoskeletal negative musculoskeletal ROS (+)   Abdominal Normal abdominal exam  (+)   Peds  Hematology negative hematology ROS (+)   Anesthesia Other Findings   Reproductive/Obstetrics                            Anesthesia Physical Anesthesia Plan  ASA: II  Anesthesia Plan: MAC   Post-op Pain Management:    Induction: Intravenous  PONV Risk Score and Plan: 3 and Propofol infusion, Ondansetron and Midazolam  Airway Management Planned: Natural Airway and Simple Face Mask  Additional Equipment: None  Intra-op Plan:   Post-operative Plan:   Informed Consent: I have reviewed the patients History and Physical, chart, labs and discussed the procedure including the risks, benefits and alternatives for the proposed anesthesia with the patient or authorized representative who has indicated his/her understanding and acceptance.       Plan Discussed with: CRNA  Anesthesia Plan Comments:        Anesthesia Quick Evaluation

## 2018-09-04 NOTE — H&P (Signed)
April [name] is an 68 y.o. female.   Chief Complaint: breast cancer HPI:  41 yof s/p right lump/sn and port placement. doing fine. no issues. she has 1.6 cm grade II IDC with negative margins. 5 sn negative. is er/pr negative and her2 positive with ki of 30. genetics was negative. she then underwent adjuvant chemotherapy. she completed trastumuzab. she has completed radiotherapy as well in May 2019. she is due for port removal in a couple week  Past Medical History:  Diagnosis Date  . Breast cancer ([place]) 05/2017   right breast  . Eczema   . Family history of breast cancer   . Family history of ovarian cancer   . History of radiation therapy 10/17/17- 11/14/17   40.05 directed to the right breast in 15 fractions, followed by a boost of 10 gy given in 5 fractions.   . Hypertension    toxemia during pregnancy, no meds now    Past Surgical History:  Procedure Laterality Date  . ABDOMINAL HYSTERECTOMY    . BREAST LUMPECTOMY WITH RADIOACTIVE SEED AND SENTINEL LYMPH NODE BIOPSY Right 07/17/2017   Procedure: BREAST LUMPECTOMY WITH RADIOACTIVE SEED AND SENTINEL LYMPH NODE BIOPSY;  Surgeon: Rolm Bookbinder, MD;  Location: [facility];  Service: General;  Laterality: Right;  . CESAREAN SECTION     x4  . DILATION AND CURETTAGE OF UTERUS    . KNEE SURGERY Right   . PORTACATH PLACEMENT N/A 07/17/2017   Procedure: INSERTION PORT-A-CATH WITH [place];  Surgeon: Rolm Bookbinder, MD;  Location: [facility];  Service: General;  Laterality: N/A;    Family History  Problem Relation Age of Onset  . Breast cancer Mother 10       again at 63 in other breast   . Heart attack Father 7  . Breast cancer Sister 9  . Breast cancer Maternal Grandmother 22       spread to lungs, died at 64  . Ovarian cancer Cousin 68  . Prostate cancer Cousin    Social History:  reports that she has never smoked. She has never used smokeless tobacco. She reports current alcohol use.  She reports that she does not use drugs.  Allergies:  Allergies  Allergen Reactions  . Tape Itching and Rash  . Nsaids   . Sulfa Antibiotics Rash    Medications Prior to Admission  Medication Sig Dispense Refill  . aspirin 81 MG chewable tablet Chew 81 mg by mouth daily.    Marland Kitchen CALCIUM-VITAMIN D PO Take 1 tablet by mouth daily.    . Coenzyme Q10 (COQ10 PO) Take by mouth every other day.     . hydrochlorothiazide (MICROZIDE) 12.5 MG capsule Take 1 capsule (12.5 mg total) by mouth daily. 90 capsule 3  . losartan (COZAAR) 100 MG tablet Take 1.5 tablets (150 mg total) by mouth daily. 140 tablet 1  . pravastatin (PRAVACHOL) 80 MG tablet Take 1 tablet (80 mg total) by mouth daily at 6 PM. (Patient taking differently: Take 80 mg by mouth every other day. ) 60 tablet 0  . vitamin B-12 (CYANOCOBALAMIN) 1000 MCG tablet Take 1,000 mcg by mouth daily.    Marland Kitchen lidocaine-prilocaine (EMLA) cream Apply 1 application topically as needed. 30 g 0    No results found for this or any previous visit (from the past 48 hour(s)). No results found.  ROS General Not Present- Appetite Loss, Chills, Fatigue, Fever, Night Sweats, Weight Gain and Weight Loss. Skin Not Present- Change in  Wart/Mole, Dryness, Hives, Jaundice, New Lesions, Non-Healing Wounds, Rash and Ulcer. HEENT Present- Hoarseness and Seasonal Allergies. Not Present- Earache, Hearing Loss, Nose Bleed, Oral Ulcers, Ringing in the Ears, Sinus Pain, Sore Throat, Visual Disturbances, Wears glasses/contact lenses and Yellow Eyes. Respiratory Present- Snoring. Not Present- Bloody sputum, Chronic Cough, Difficulty Breathing and Wheezing. Breast Present- Breast Mass. Not Present- Breast Pain, Nipple Discharge and Skin Changes. Cardiovascular Present- Palpitations. Not Present- Chest Pain, Difficulty Breathing Lying Down, Leg Cramps, Rapid Heart Rate, Shortness of Breath and Swelling of Extremities. Gastrointestinal Not Present- Abdominal Pain, Bloating, Bloody  Stool, Change in Bowel Habits, Chronic diarrhea, Constipation, Difficulty Swallowing, Excessive gas, Gets full quickly at meals, Hemorrhoids, Indigestion, Nausea, Rectal Pain and Vomiting. Female Genitourinary Not Present- Frequency, Nocturia, Painful Urination, Pelvic Pain and Urgency. Musculoskeletal Present- Back Pain. Not Present- Joint Pain, Joint Stiffness, Muscle Pain, Muscle Weakness and Swelling of Extremities. Neurological Not Present- Decreased Memory, Fainting, Headaches, Numbness, Seizures, Tingling, Tremor, Trouble walking and Weakness. Psychiatric Not Present- Anxiety, Bipolar, Change in Sleep Pattern, Depression, Fearful and Frequent crying. Endocrine Not Present- Cold Intolerance, Excessive Hunger, Hair Changes, Heat Intolerance, Hot flashes and New Diabetes. Hematology Not Present- Blood Thinners, Easy Bruising, Excessive bleeding, Gland problems, HIV and Persistent Infections.  Blood pressure (!) 176/84, pulse 73, temperature 98.7 F (37.1 C), temperature source Oral, resp. rate 16, height 5' 3.5" (1.613 m), weight 67.5 kg, SpO2 100 %. Physical Exam  General Mental Status-Alert. Orientation-Oriented X3. Breast Nipples-No Discharge. Breast Lump-No Palpable Breast Mass. Lymphatic Head & Neck General Head & Neck Lymphatics: Bilateral - Description - Normal. Axillary General Axillary Region: Bilateral - Description - Normal. Note: no [place] adenopathy  Assessment/Plan STAGE I BREAST CANCER, RIGHT (C50.911) Story: no clinical evidence recurrence, will remove port in two weeks. continue annual mm.   Rolm Bookbinder, MD 09/04/2018, 11:40 AM

## 2018-09-04 NOTE — Progress Notes (Signed)
Pt states has had an "itch" around port site at times, especially after infusions thru port.

## 2018-09-04 NOTE — Discharge Instructions (Signed)
°Post Anesthesia Home Care Instructions ° °Activity: °Get plenty of rest for the remainder of the day. A responsible individual must stay with you for 24 hours following the procedure.  °For the next 24 hours, DO NOT: °-Drive a car °-Operate machinery °-Drink alcoholic beverages °-Take any medication unless instructed by your physician °-Make any legal decisions or sign important papers. ° °Meals: °Start with liquid foods such as gelatin or soup. Progress to regular foods as tolerated. Avoid greasy, spicy, heavy foods. If nausea and/or vomiting occur, drink only clear liquids until the nausea and/or vomiting subsides. Call your physician if vomiting continues. ° °Special Instructions/Symptoms: °Your throat may feel dry or sore from the anesthesia or the breathing tube placed in your throat during surgery. If this causes discomfort, gargle with warm salt water. The discomfort should disappear within 24 hours. ° °If you had a scopolamine patch placed behind your ear for the management of post- operative nausea and/or vomiting: ° °1. The medication in the patch is effective for 72 hours, after which it should be removed.  Wrap patch in a tissue and discard in the trash. Wash hands thoroughly with soap and water. °2. You may remove the patch earlier than 72 hours if you experience unpleasant side effects which may include dry mouth, dizziness or visual disturbances. °3. Avoid touching the patch. Wash your hands with soap and water after contact with the patch. °   ° ° ° °[facility] °Office Phone Number [phone] ° ° POST OP INSTRUCTIONS °Take 400 mg of ibuprofen every 8 hours or 650 mg tylenol every 6 hours for next 72 hours then as needed. Use ice several times daily also. °Always review your discharge instruction sheet given to you by the facility where your surgery was performed. ° °IF YOU HAVE DISABILITY OR FAMILY LEAVE FORMS, YOU MUST BRING THEM TO THE OFFICE FOR PROCESSING.  DO NOT GIVE THEM  TO YOUR DOCTOR. ° °1. A prescription for pain medication may be given to you upon discharge.  Take your pain medication as prescribed, if needed.  If narcotic pain medicine is not needed, then you may take acetaminophen (Tylenol), naprosyn (Alleve) or ibuprofen (Advil) as needed. °2. Take your usually prescribed medications unless otherwise directed °3. If you need a refill on your pain medication, please contact your pharmacy.  They will contact our office to request authorization.  Prescriptions will not be filled after 5pm or on week-ends. °4. You should eat very light the first 24 hours after surgery, such as soup, crackers, pudding, etc.  Resume your normal diet the day after surgery. °5. Most patients will experience some swelling and bruising.  Ice packs will help. Swelling and bruising can take several days to resolve.  °6. It is common to experience some constipation if taking pain medication after surgery.  Increasing fluid intake and taking a stool softener will usually help or prevent this problem from occurring.  A mild laxative (Milk of Magnesia or Miralax) should be taken according to package directions if there are no bowel movements after 48 hours. °7. Unless discharge instructions indicate otherwise, you may remove your bandages 48 hours after surgery and you may shower at that time.  You may have steri-strips (small skin tapes) in place directly over the incision.  These strips should be left on the skin for 7-10 days and will come off on their own.  If your surgeon used skin glue on the incision, you may shower in 24 hours.  The   glue will flake off over the next 2-3 weeks.  Any sutures or staples will be removed at the office during your follow-up visit. °8. ACTIVITIES:  You may resume regular daily activities (gradually increasing) beginning the next day.  Wearing a good support bra or sports bra minimizes pain and swelling.  You may have sexual intercourse when it is comfortable. °a. You may  drive when you no longer are taking prescription pain medication, you can comfortably wear a seatbelt, and you can safely maneuver your car and apply brakes. °b. RETURN TO WORK:  ______________________________________________________________________________________ °9. You should see your doctor in the office for a follow-up appointment approximately two weeks after your surgery.  Your doctor’s nurse will typically make your follow-up appointment when she calls you with your pathology report.  Expect your pathology report 3-4 business days after your surgery.  You may call to check if you do not hear from us after three days. °10. OTHER INSTRUCTIONS: _______________________________________________________________________________________________ _____________________________________________________________________________________________________________________________________ °_____________________________________________________________________________________________________________________________________ °_____________________________________________________________________________________________________________________________________ ° °WHEN TO CALL DR WAKEFIELD: °1. Fever over 101.0 °2. Nausea and/or vomiting. °3. Extreme swelling or bruising. °4. Continued bleeding from incision. °5. Increased pain, redness, or drainage from the incision. ° °The clinic staff is available to answer your questions during regular business hours.  Please don’t hesitate to call and ask to speak to one of the nurses for clinical concerns.  If you have a medical emergency, go to the nearest emergency room or call 911.  A surgeon from [facility] is always on call at the hospital. ° °For further questions, please visit [email or web address] ° °

## 2018-09-04 NOTE — Transfer of Care (Signed)
Immediate Anesthesia Transfer of Care Note  Patient: April [name]  Procedure(s) Performed: REMOVAL PORT-A-CATH (Right Chest)  Patient Location: PACU  Anesthesia Type:MAC  Level of Consciousness: awake, alert  and oriented  Airway & Oxygen Therapy: Patient Spontanous Breathing and Patient connected to face mask oxygen  Post-op Assessment: Report given to RN and Post -op Vital signs reviewed and stable  Post vital signs: Reviewed and stable  Last Vitals:  Vitals Value Taken Time  BP 128/72 09/04/2018 12:30 PM  Temp    Pulse 63 09/04/2018 12:31 PM  Resp 13 09/04/2018 12:31 PM  SpO2 100 % 09/04/2018 12:31 PM  Vitals shown include unvalidated device data.  Last Pain:  Vitals:   09/04/18 1027  TempSrc: Oral  PainSc: 0-No pain      Patients Stated Pain Goal: 4 (93/90/30 0923)  Complications: No apparent anesthesia complications

## 2018-09-04 NOTE — Interval H&P Note (Signed)
History and Physical Interval Note:  09/04/2018 11:42 AM  April [name]  has presented today for surgery, with the diagnosis of RIGHT BREAST CANCER.  The various methods of treatment have been discussed with the patient and family. After consideration of risks, benefits and other options for treatment, the patient has consented to  Procedure(s): REMOVAL PORT-A-CATH (N/A) as a surgical intervention.  The patient's history has been reviewed, patient examined, no change in status, stable for surgery.  I have reviewed the patient's chart and labs.  Questions were answered to the patient's satisfaction.     Rolm Bookbinder

## 2018-09-04 NOTE — Anesthesia Procedure Notes (Signed)
Procedure Name: MAC Date/Time: 09/04/2018 12:11 PM Performed by: Verita Lamb, CRNA Pre-anesthesia Checklist: Patient identified, Emergency Drugs available, Suction available, Patient being monitored and Timeout performed Patient Re-evaluated:Patient Re-evaluated prior to induction Oxygen Delivery Method: Simple face mask

## 2018-09-04 NOTE — Op Note (Signed)
Preoperative diagnosis: Breast cancer status post chemotherapy, no longer needs venous access Postoperative diagnosis: Same as above Procedure: Right internal jugular port removal Surgeon: Dr. Serita Grammes Anesthesia: Local with sedation Estimated blood loss: Minimal Complications: None Drains: None Sponge and count was correct at completion Disposition to recovery in stable condition  Indications: This a 68 year old female who presents after completion of chemotherapy.  She desires removal of her port.  Procedure: After informed consent was obtained the patient was taken to the operating room.  She was placed under monitored anesthesia care.  She was prepped and draped in the standard sterile surgical fashion.  A surgical timeout was then performed.  I infiltrated a mixture of Marcaine and lidocaine in the region of the port.  I then reentered the old incision.  The port and all the suture material as well as the line were removed in their entirety.  Hemostasis was observed.  I closed this with 3-0 Vicryl and 4-0 Monocryl.  Glue was applied.  She tolerated this well was transferred to recovery stable.

## 2018-09-05 NOTE — Anesthesia Postprocedure Evaluation (Signed)
Anesthesia Post Note  Patient: April [name]  Procedure(s) Performed: REMOVAL PORT-A-CATH (Right Chest)     Patient location during evaluation: PACU Anesthesia Type: MAC Level of consciousness: awake and alert Pain management: pain level controlled Vital Signs Assessment: post-procedure vital signs reviewed and stable Respiratory status: spontaneous breathing and respiratory function stable Cardiovascular status: stable Postop Assessment: no apparent nausea or vomiting Anesthetic complications: no    Last Vitals:  Vitals:   09/04/18 1255 09/04/18 1318  BP:  (!) 160/82  Pulse: 63 71  Resp: 14 16  Temp:  36.6 C  SpO2: 100% 96%    Last Pain:  Vitals:   09/04/18 1318  TempSrc: Oral  PainSc: 0-No pain                 Nitika Jackowski DANIEL

## 2018-09-06 ENCOUNTER — Encounter (HOSPITAL_BASED_OUTPATIENT_CLINIC_OR_DEPARTMENT_OTHER): Payer: Self-pay | Admitting: General Surgery

## 2018-10-29 ENCOUNTER — Other Ambulatory Visit (HOSPITAL_COMMUNITY): Payer: Self-pay | Admitting: Internal Medicine

## 2018-11-20 ENCOUNTER — Telehealth: Payer: Self-pay | Admitting: Oncology

## 2018-11-20 NOTE — Telephone Encounter (Signed)
Called patient regarding upcoming Webex appointment, per patient's request this will be a telephone visit due to past difficulties with Webex.

## 2018-11-22 ENCOUNTER — Telehealth: Payer: Self-pay | Admitting: Adult Health

## 2018-11-22 NOTE — Telephone Encounter (Signed)
Contacted patient to verify telephone visit for pre reg °

## 2018-11-25 ENCOUNTER — Encounter: Payer: Self-pay | Admitting: Adult Health

## 2018-11-25 ENCOUNTER — Inpatient Hospital Stay: Payer: Medicare Other | Attending: Adult Health | Admitting: Adult Health

## 2018-11-25 DIAGNOSIS — Z9221 Personal history of antineoplastic chemotherapy: Secondary | ICD-10-CM

## 2018-11-25 DIAGNOSIS — Z8041 Family history of malignant neoplasm of ovary: Secondary | ICD-10-CM

## 2018-11-25 DIAGNOSIS — C50411 Malignant neoplasm of upper-outer quadrant of right female breast: Secondary | ICD-10-CM | POA: Diagnosis not present

## 2018-11-25 DIAGNOSIS — Z803 Family history of malignant neoplasm of breast: Secondary | ICD-10-CM

## 2018-11-25 DIAGNOSIS — Z171 Estrogen receptor negative status [ER-]: Secondary | ICD-10-CM

## 2018-11-25 DIAGNOSIS — G620 Drug-induced polyneuropathy: Secondary | ICD-10-CM

## 2018-11-25 DIAGNOSIS — Z7982 Long term (current) use of aspirin: Secondary | ICD-10-CM

## 2018-11-25 DIAGNOSIS — Z79899 Other long term (current) drug therapy: Secondary | ICD-10-CM

## 2018-11-25 DIAGNOSIS — Z923 Personal history of irradiation: Secondary | ICD-10-CM

## 2018-11-25 NOTE — Progress Notes (Signed)
SURVIVORSHIP VIRTUAL VISIT:  I connected with April [name] on 11/25/18 at 10:00 AM EDT by telephone and verified that I am speaking with the correct person using two identifiers.   I discussed the limitations, risks, security and privacy concerns of performing an evaluation and management service by telephone and the availability of in person appointments. I also discussed with the patient that there may be a patient responsible charge related to this service. The patient expressed understanding and agreed to proceed.   BRIEF ONCOLOGIC HISTORY:    Malignant neoplasm of upper-outer quadrant of right breast in female, estrogen receptor negative (April [name])   06/20/2017 Initial Diagnosis     [facility] woman status post right breast upper outer quadrant biopsy 06/20/2017 for a clinical T1b N0, stage IA invasive ductal carcinoma, grade 3, estrogen and progesterone receptor negative, but HER-2 amplified, with an MIB-1 of 30%                 07/05/2017 Genetic Testing    The patient had genetic testing due to a personal history of breast cancer and family history of breast and ovarian cancer.  The Common Hereditary Cancer Panel was ordered. The Common Hereditary Cancer Panel offered by [facility] includes sequencing and/or deletion duplication testing of the following 47 genes: APC, ATM, AXIN2, BARD1, BMPR1A, BRCA1, BRCA2, BRIP1, CDH1, CDKN2A (p14ARF), CDKN2A (p16INK4a), CKD4, CHEK2, CTNNA1, DICER1, EPCAM (Deletion/duplication testing only), GREM1 (promoter region deletion/duplication testing only), KIT, MEN1, MLH1, MSH2, MSH3, MSH6, MUTYH, NBN, NF1, NHTL1, PALB2, PDGFRA, PMS2, POLD1, POLE, PTEN, RAD50, RAD51C, RAD51D, SDHB, SDHC, SDHD, SMAD4, SMARCA4. STK11, TP53, TSC1, TSC2, and VHL.  The following genes were evaluated for sequence changes only: SDHA and HOXB13 c.251G>A variant only.  Results: No pathogenic variants identified.  A Variant of uncertain significance in the gene MSH2 was identified  c.1331G>T (p.Arg444Leu).  The date of this test report is 07/05/2017.    07/17/2017 Surgery    status post right lumpectomy and sentinel lymph node sampling for a pT1c pN0, stage IA invasive ductal carcinoma, grade 2, with negative margins.  A total of 5 lymph nodes were removed     08/07/2017 - 07/23/2018 Adjuvant Chemotherapy    adjuvant chemotherapy consisting of paclitaxel weekly x12 together with trastuzumab every 21 days starting 08/07/2017             (a) paclitaxel stopped after 8 doses because of neuropathy (last dose 09/25/2017  (4) trastuzumab continued to total 1 year (last dose 07/23/2018)             (a) echo 08/10/2017 showed an ejection fraction in the 65-70% range             (b) echo on 11/01/2017 shows EF of 60-65%             (c) cardiogram 02/05/2018 shows an ejection fraction in the 60-65% range.             (d) echocardiogram 05/28/2018 shows an ejection fraction in the 65-70%.      10/17/2017 - 11/14/2017 Radiation Therapy    (5) adjuvant radiation: 40.05 Gy directed to the Right Breast in 15 fractions, followed by a boost of 10 Gy given in 5 fractions      INTERVAL HISTORY:  April [name] to review her survivorship care plan detailing her treatment course for breast cancer, as well as monitoring long-term side effects of that treatment, education regarding health maintenance, screening, and overall wellness and health promotion.     Overall,  April [name] reports feeling quite well.  She completed radiation 10/2017.  She notes that she was able to stop her blood pressure medication since stopping her treatment.  She was going to the gym 3-4 times per week prior to [place] pandemic.  Now she is walking, but notes she isn't as active as she previously.  She sees her PCP, Dr. Laurann [name] annually in May.    April [name] has changed her diet.  She is eating seafood and vegetables, and has stopped eating meats.  She notes that she has lost some weight with this change.  She notes that  she continues to have chemotherapy induced peripheral neuropathy.  She says it is intermittent and brief.  She notes that it is noticeable but not incredibly painful or causes distress for her. She has occasional joint stiffness as well and thinks that exercise helps with this.    REVIEW OF SYSTEMS:  Review of Systems - Oncology Breast: Denies any new nodularity, masses, tenderness, nipple changes, or nipple discharge.      ONCOLOGY TREATMENT TEAM:  1. Surgeon:  Dr. Donne Hazel at [facility] 2. Medical Oncologist: Dr. Jana Hakim  3. Radiation Oncologist: Dr. Isidore Moos    PAST MEDICAL/SURGICAL HISTORY:  Past Medical History:  Diagnosis Date  . Breast cancer ([place]) 05/2017   right breast  . Eczema   . Family history of breast cancer   . Family history of ovarian cancer   . History of radiation therapy 10/17/17- 11/14/17   40.05 directed to the right breast in 15 fractions, followed by a boost of 10 gy given in 5 fractions.   . Hypertension    toxemia during pregnancy, no meds now   Past Surgical History:  Procedure Laterality Date  . ABDOMINAL HYSTERECTOMY    . BREAST LUMPECTOMY WITH RADIOACTIVE SEED AND SENTINEL LYMPH NODE BIOPSY Right 07/17/2017   Procedure: BREAST LUMPECTOMY WITH RADIOACTIVE SEED AND SENTINEL LYMPH NODE BIOPSY;  Surgeon: Rolm Bookbinder, MD;  Location: [facility];  Service: General;  Laterality: Right;  . CESAREAN SECTION     x4  . DILATION AND CURETTAGE OF UTERUS    . KNEE SURGERY Right   . PORT-A-CATH REMOVAL Right 09/04/2018   Procedure: REMOVAL PORT-A-CATH;  Surgeon: Rolm Bookbinder, MD;  Location: [facility];  Service: General;  Laterality: Right;  . PORTACATH PLACEMENT N/A 07/17/2017   Procedure: INSERTION PORT-A-CATH WITH [place];  Surgeon: Rolm Bookbinder, MD;  Location: [facility];  Service: General;  Laterality: N/A;     ALLERGIES:  Allergies  Allergen Reactions  . Tape Itching and Rash  .  Nsaids   . Sulfa Antibiotics Rash     CURRENT MEDICATIONS:  Outpatient Encounter Medications as of 11/25/2018  Medication Sig  . aspirin 81 MG chewable tablet Chew 81 mg by mouth daily.  Marland Kitchen CALCIUM-VITAMIN D PO Take 1 tablet by mouth daily.  . Coenzyme Q10 (COQ10 PO) Take by mouth every other day.   . hydrochlorothiazide (MICROZIDE) 12.5 MG capsule TAKE 1 CAPSULE BY MOUTH EVERY DAY  . lidocaine-prilocaine (EMLA) cream Apply 1 application topically as needed.  Marland Kitchen losartan (COZAAR) 100 MG tablet Take 1.5 tablets (150 mg total) by mouth daily.  . pravastatin (PRAVACHOL) 80 MG tablet Take 1 tablet (80 mg total) by mouth daily at 6 PM. (Patient taking differently: Take 80 mg by mouth every other day. )  . vitamin B-12 (CYANOCOBALAMIN) 1000 MCG tablet Take 1,000 mcg by mouth daily.  . [DISCONTINUED] prochlorperazine (COMPAZINE)  10 MG tablet Take 1 tablet (10 mg total) by mouth every 6 (six) hours as needed (Nausea or vomiting).   No facility-administered encounter medications on file as of 11/25/2018.      ONCOLOGIC FAMILY HISTORY:  Family History  Problem Relation Age of Onset  . Breast cancer Mother 77       again at 79 in other breast   . Heart attack Father 15  . Breast cancer Sister 90  . Breast cancer Maternal Grandmother 41       spread to lungs, died at 24  . Ovarian cancer Cousin 68  . Prostate cancer Cousin      GENETIC COUNSELING/TESTING: See above  SOCIAL HISTORY:  Social History   Socioeconomic History  . Marital status: Married    Spouse name: Not on file  . Number of children: Not on file  . Years of education: Not on file  . Highest education level: Not on file  Occupational History  . Not on file  Social Needs  . Financial resource strain: Not on file  . Food insecurity:    Worry: Not on file    Inability: Not on file  . Transportation needs:    Medical: Not on file    Non-medical: Not on file  Tobacco Use  . Smoking status: Never Smoker  . Smokeless  tobacco: Never Used  Substance and Sexual Activity  . Alcohol use: Yes    Comment: social  . Drug use: No  . Sexual activity: Not Currently  Lifestyle  . Physical activity:    Days per week: Not on file    Minutes per session: Not on file  . Stress: Not on file  Relationships  . Social connections:    Talks on phone: Not on file    Gets together: Not on file    Attends religious service: Not on file    Active member of club or organization: Not on file    Attends meetings of clubs or organizations: Not on file    Relationship status: Not on file  . Intimate partner violence:    Fear of current or ex partner: Not on file    Emotionally abused: Not on file    Physically abused: Not on file    Forced sexual activity: Not on file  Other Topics Concern  . Not on file  Social History Narrative  . Not on file     OBSERVATIONS/OBJECTIVE:   LABORATORY DATA:  None for this visit.  DIAGNOSTIC IMAGING:  None for this visit.      ASSESSMENT AND PLAN:  Ms.. Tweten is a pleasant 68 y.o. female with Stage IA right breast invasive ductal carcinoma, ER-/PR-/HER2+, diagnosed in 06/2017, treated with lumpectomy, adjuvant chemotherapy, and adjuvant radiation therapy.  She presents to the Survivorship Clinic for our initial meeting and routine follow-up post-completion of treatment for breast cancer.    1. Stage IA right breast cancer:  Ms. Strough is continuing to recover from definitive treatment for breast cancer. She will follow-up with her medical oncologist, Dr. Jana Hakim in 06/2019 with history and physical exam per surveillance protocol.  Her mammogram is due 05/2019; orders placed today.  Her breast density is category C. Today, a comprehensive survivorship care plan and treatment summary was reviewed with the patient today detailing her breast cancer diagnosis, treatment course, potential late/long-term effects of treatment, appropriate follow-up care with recommendations for the  future, and patient education resources.  A copy of this summary, along  with a letter will be sent to the patient's primary care provider via mail/fax/In Basket message after today's visit.    2. Bone health:  Given Ms. Duda's age/history of breast cancer, she is at risk for bone demineralization.  She has bone density testing regularly with Dr. Laurann [name].  She is unsure what her results are, but notes she is taking Calcium regularly as well as Vitamin D3.  She notes that her vitamin d level was low when tested with her labs in May and she is now taking 50,000 units weekly per her PCP. She was given education on specific activities to promote bone health.  3. Cancer screening:  Due to Ms. Vazguez's history and her age, she should receive screening for skin cancers, colon cancer, and gynecologic cancers.  The information and recommendations are listed on the patient's comprehensive care plan/treatment summary and were reviewed in detail with the patient.    4. Health maintenance and wellness promotion: Ms. Guedes was encouraged to consume 5-7 servings of fruits and vegetables per day. We reviewed the "Nutrition Rainbow" handout, as well as the handout "Take Control of Your Health and Reduce Your Cancer Risk" from the [place].  She was also encouraged to engage in moderate to vigorous exercise for 30 minutes per day most days of the week. We discussed the [employer] fitness program, which is designed for cancer survivors to help them become more physically fit after cancer treatments.  She was instructed to limit her alcohol consumption and continue to abstain from tobacco use.     5. Support services/counseling: It is not uncommon for this period of the patient's cancer care trajectory to be one of many emotions and stressors.  We discussed how this can be increasingly difficult during the times of quarantine and social distancing due to the COVID-19 pandemic.   She was given  information regarding our available services and encouraged to contact me with any questions or for help enrolling in any of our support group/programs.    Follow up instructions:    -Return to cancer center in 06/2019 for f/u with Dr. Jana Hakim  -Mammogram due in 05/2019 -She is welcome to return back to the Survivorship Clinic at any time; no additional follow-up needed at this time.  -Consider referral back to survivorship as a long-term survivor for continued surveillance  The patient was provided an opportunity to ask questions and all were answered. The patient agreed with the plan and demonstrated an understanding of the instructions.   The patient was advised to call back or seek an in-person evaluation if the symptoms worsen or if the condition fails to improve as anticipated.   I provided 33 minutes of non face-to-face telephone visit time during this encounter, and > 50% was spent counseling as documented under my assessment & plan.  Scot Dock, NP

## 2019-05-30 ENCOUNTER — Telehealth: Payer: Self-pay | Admitting: Oncology

## 2019-05-30 ENCOUNTER — Other Ambulatory Visit: Payer: Self-pay | Admitting: Oncology

## 2019-05-30 NOTE — Telephone Encounter (Signed)
R/s appt per 12/11 sch message - unable to reach pt . Left message with new appt date and time

## 2019-06-20 HISTORY — PX: EYE SURGERY: SHX253

## 2019-07-02 ENCOUNTER — Ambulatory Visit: Payer: Medicare Other | Admitting: Oncology

## 2019-07-02 ENCOUNTER — Other Ambulatory Visit: Payer: Medicare Other

## 2019-07-09 NOTE — Progress Notes (Signed)
[place]  [phone]) [phone] [phone]     ID: April [name] DOB: 1950/11/05  MR#: [id number]  HFW#:[id number]  Patient Care Team: Kelton Pillar, MD as PCP - General (Family Medicine) Danyela Posas, Virgie Dad, MD as Consulting Physician (Oncology) Rolm Bookbinder, MD as Consulting Physician (General Surgery) Eppie Gibson, MD as Attending Physician (Radiation Oncology) Newt Minion, MD as Consulting Physician (Orthopedic Surgery) Regal, Tamala Fothergill, DPM as Consulting Physician (Podiatry) Neldon Mc, Donnamarie Poag, MD as Consulting Physician (Allergy and Immunology) OTHER MD:  CHIEF COMPLAINT: Estrogen receptor negative breast cancer  CURRENT TREATMENT: observation   INTERVAL HISTORY: April [name] returns today for follow-up of her estrogen receptor negative breast cancer. She is under observation. She is accompanied by her husband.  Since her last visit, she underwent port removal on 09/04/2018.  She also underwent bilateral diagnostic mammography with tomography at [facility] on 06/11/2019 showing: breast density category C; no evidence of malignancy in either breast.    REVIEW OF SYSTEMS: April [name] is exercising by using a [employer] and she also has a vibrating stand that she says is really helping with edema in the breast.  She stands on the vibrating platform and that somehow helps with swelling.  She has had no unusual headaches visual changes cough phlegm production pleurisy or shortness of breath.  She is taking appropriate pandemic precautions.  A detailed review of systems today was otherwise stable.   HISTORY OF CURRENT ILLNESS: From the original intake note:  "April [name]" had bilateral screening mammography at [facility] 06/06/2017.  This showed a possible mass in the right breast at the 11 o'clock position.  On 06/13/2017 she underwent right diagnostic mammography and ultrasonography.  Breast density was category C.  In the right breast at the 11 o'clock position  there was a 1 cm area by mammography.  By ultrasound this confirmed a 1.0 cm irregular mass with lobulated margins in the upper outer quadrant of the right breast.  There was a second, 0.4 cm lobulated mass in the same quadrant.  The right axilla was sonographically benign.  On 06/20/2017 biopsy of the 2 right breast masses in question was performed.  The final pathology (SAA [id number]) found the smaller mass to be only fibrocystic change.  This is felt to be concordant.  The larger mass however was an invasive ductal carcinoma, grade 3, estrogen and progesterone receptor negative, with an MIB-1 of 30%, and HER-2 amplified, with a signals ratio of 2.24.  The number per cell was 4.60.  The patient's subsequent history is as detailed below.   PAST MEDICAL HISTORY: Past Medical History:  Diagnosis Date  . Breast cancer ([place]) 05/2017   right breast  . Eczema   . Family history of breast cancer   . Family history of ovarian cancer   . History of radiation therapy 10/17/17- 11/14/17   40.05 directed to the right breast in 15 fractions, followed by a boost of 10 gy given in 5 fractions.   . Hypertension    toxemia during pregnancy, no meds now    PAST SURGICAL HISTORY: Past Surgical History:  Procedure Laterality Date  . ABDOMINAL HYSTERECTOMY    . BREAST LUMPECTOMY WITH RADIOACTIVE SEED AND SENTINEL LYMPH NODE BIOPSY Right 07/17/2017   Procedure: BREAST LUMPECTOMY WITH RADIOACTIVE SEED AND SENTINEL LYMPH NODE BIOPSY;  Surgeon: Rolm Bookbinder, MD;  Location: [place];  Service: General;  Laterality: Right;  . CESAREAN SECTION     x4  . DILATION AND CURETTAGE  OF UTERUS    . KNEE SURGERY Right   . PORT-A-CATH REMOVAL Right 09/04/2018   Procedure: REMOVAL PORT-A-CATH;  Surgeon: Rolm Bookbinder, MD;  Location: [place];  Service: General;  Laterality: Right;  . PORTACATH PLACEMENT N/A 07/17/2017   Procedure: INSERTION PORT-A-CATH WITH [place];  Surgeon: Rolm Bookbinder, MD;  Location: [place];  Service: General;  Laterality: N/A;    FAMILY HISTORY Family History  Problem Relation Age of Onset  . Breast cancer Mother 35       again at 101 in other breast   . Heart attack Father 52  . Breast cancer Sister 37  . Breast cancer Maternal Grandmother 22       spread to lungs, died at 54  . Ovarian cancer Cousin 72  . Prostate cancer Cousin    The patient's father died at age 62 from a heart attack.  The patient's mother is currently living at age 61 (as of January 2019).  The patient had 1 sister who was diagnosed with breast cancer at age 58 and died from metastatic disease at age 76.  The patient has 1 brother.  In addition the patient's mother was diagnosed with breast cancer at age 53, on the left side, and now has a right-sided breast cancer diagnosed in January 2019.  There is in addition a cousin with ovarian cancer diagnosed when she was 70 years old   GYNECOLOGIC HISTORY:  No LMP recorded. Patient has had a hysterectomy. Menarche age 47, first live birth age 57, the patient had 3 live births, 1 of whom survived only 2 days.  She underwent hysterectomy without salpingo-oophorectomy September 05, 1978.  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  She is now retired.  Her husband April [name] his Theme park manager at [place].  The patient's daughter April [name] lives in [place] where she is an Tourist information centre manager.  The patient's daughter April [name] lives in [place] working for the department of defense.  The patient has 3 grandchildren, 1 of whom is in the seventh grade but is actually the captain of the eighth grade basketball team and is playing in a championship February 2020.   ADVANCED DIRECTIVES: Not in place   HEALTH MAINTENANCE: Social History   Tobacco Use  . Smoking status: Never Smoker   . Smokeless tobacco: Never Used  Substance Use Topics  . Alcohol use: Yes    Comment: social  . Drug use: No     Colonoscopy: April 2018/[facility]  PAP: Status post hysterectomy  Bone density:   Allergies  Allergen Reactions  . Tape Itching and Rash  . Nsaids   . Sulfa Antibiotics Rash    Current Outpatient Medications  Medication Sig Dispense Refill  . aspirin 81 MG chewable tablet Chew 81 mg by mouth daily.    Marland Kitchen CALCIUM-VITAMIN D PO Take 1 tablet by mouth daily.    . Coenzyme Q10 (COQ10 PO) Take by mouth every other day.     . hydrochlorothiazide (MICROZIDE) 12.5 MG capsule TAKE 1 CAPSULE BY MOUTH EVERY DAY 90 capsule 3  . lidocaine-prilocaine (EMLA) cream Apply 1 application topically as needed. 30 g 0  . pravastatin (PRAVACHOL) 80 MG tablet Take 80 mg by mouth every other day.    . vitamin B-12 (CYANOCOBALAMIN) 1000 MCG tablet Take 1,000 mcg by mouth daily.  No current facility-administered medications for this visit.    OBJECTIVE: Middle-aged African-American woman in no acute distress Vitals:   07/10/19 1020  BP: (!) 154/86  Pulse: 86  Resp: 18  Temp: 97.8 F (36.6 C)  SpO2: 100%     Body mass index is 26.68 kg/m.    Wt Readings from Last 3 Encounters:  07/10/19 153 lb (69.4 kg)  09/04/18 148 lb 13 oz (67.5 kg)  07/23/18 149 lb 9.6 oz (67.9 kg)  ECOG FS:0 - Asymptomatic   Sclerae unicteric, EOMs intact Wearing a mask No cervical or supraclavicular adenopathy Lungs no rales or rhonchi Heart regular rate and rhythm Abd soft, nontender, positive bowel sounds MSK no focal spinal tenderness, no upper extremity lymphedema Neuro: nonfocal, well oriented, appropriate affect Breasts: The right breast is status post lumpectomy and radiation.  The cosmetic result is excellent.  There is no evidence of local recurrence.  Left breast is benign.  Both axillae are benign.   LAB RESULTS:  CMP     Component Value Date/Time   NA 141 07/23/2018 1119   K 3.8  07/23/2018 1119   CL 102 07/23/2018 1119   CO2 30 07/23/2018 1119   GLUCOSE 97 07/23/2018 1119   BUN 14 07/23/2018 1119   CREATININE 1.06 (H) 07/23/2018 1119   CREATININE 1.01 06/27/2017 0843   CALCIUM 10.0 07/23/2018 1119   PROT 7.5 07/23/2018 1119   ALBUMIN 4.1 07/23/2018 1119   AST 22 07/23/2018 1119   AST 16 06/27/2017 0843   ALT 16 07/23/2018 1119   ALT 14 06/27/2017 0843   ALKPHOS 80 07/23/2018 1119   BILITOT 0.6 07/23/2018 1119   BILITOT 0.2 06/27/2017 0843   GFRNONAA 54 (L) 07/23/2018 1119   GFRNONAA 56 (L) 06/27/2017 0843   GFRAA >60 07/23/2018 1119   GFRAA >60 06/27/2017 0843    No results found for: TOTALPROTELP, ALBUMINELP, A1GS, A2GS, BETS, BETA2SER, GAMS, MSPIKE, SPEI  No results found for: KPAFRELGTCHN, LAMBDASER, KAPLAMBRATIO  Lab Results  Component Value Date   WBC 4.1 07/10/2019   NEUTROABS 1.7 07/10/2019   HGB 11.7 (L) 07/10/2019   HCT 36.4 07/10/2019   MCV 88.6 07/10/2019   PLT 242 07/10/2019   No results found for: LABCA2  No components found for: AVWPVX480  No results for input(s): INR in the last 168 hours.  No results found for: LABCA2  No results found for: XKP537  No results found for: SMO707  No results found for: EML544  No results found for: CA2729  No components found for: HGQUANT  No results found for: CEA1 / No results found for: CEA1   No results found for: AFPTUMOR  No results found for: CHROMOGRNA  No results found for: HGBA, HGBA2QUANT, HGBFQUANT, HGBSQUAN (Hemoglobinopathy evaluation)   No results found for: LDH  Lab Results  Component Value Date   IRON 77 05/21/2018   TIBC 404 05/21/2018   IRONPCTSAT 19 (L) 05/21/2018   (Iron and TIBC)  Lab Results  Component Value Date   FERRITIN 24 05/21/2018    Urinalysis    Component Value Date/Time   COLORURINE COLORLESS (A) 08/09/2017 [place] 08/09/2017 2224   LABSPEC 1.004 (L) 08/09/2017 2224   PHURINE 6.0 08/09/2017 [place] 08/09/2017 [place] NEGATIVE 08/09/2017 2224   BILIRUBINUR NEGATIVE 08/09/2017 2224   KETONESUR NEGATIVE 08/09/2017 2224   PROTEINUR NEGATIVE 08/09/2017 2224   NITRITE NEGATIVE 08/09/2017 [place] 08/09/2017 2224  STUDIES: No results found.   ELIGIBLE FOR AVAILABLE RESEARCH PROTOCOL: no   ASSESSMENT: 69 y.o. April [name] woman status post right breast upper outer quadrant biopsy 06/20/2017 for a clinical T1b N0, stage IA invasive ductal carcinoma, grade 3, estrogen and progesterone receptor negative, but HER-2 amplified, with an MIB-1 of 30%  (1) genetics testing 07/05/2017 through the Common Hereditary Cancer Panel offered by [facility] found no deleterious mutations in APC, ATM, AXIN2, BARD1, BMPR1A, BRCA1, BRCA2, BRIP1, CDH1, CDKN2A (p14ARF), CDKN2A (p16INK4a), CKD4, CHEK2, CTNNA1, DICER1, EPCAM (Deletion/duplication testing only), GREM1 (promoter region deletion/duplication testing only), KIT, MEN1, MLH1, MSH2, MSH3, MSH6, MUTYH, NBN, NF1, NHTL1, PALB2, PDGFRA, PMS2, POLD1, POLE, PTEN, RAD50, RAD51C, RAD51D, SDHB, SDHC, SDHD, SMAD4, SMARCA4. STK11, TP53, TSC1, TSC2, and VHL.  The following genes were evaluated for sequence changes only: SDHA and HOXB13 c.251G>A variant only.  (a) a Variant of uncertain significance in MSH2 was identified c.1331G>T (p.Arg444Leu).   (2) status post right lumpectomy and sentinel lymph node sampling 07/17/2017 for a pT1c pN0, stage IA invasive ductal carcinoma, grade 2, with negative margins.  A total of 5 lymph nodes were removed  (3) adjuvant chemotherapy consisting of paclitaxel weekly x12 together with trastuzumab every 21 days starting 08/07/2017  (a) paclitaxel stopped after 8 doses because of neuropathy (last dose 09/25/2017  (4) trastuzumab continued to total 1 year (last dose 07/23/2018)  (a) echo 08/10/2017 showed an ejection fraction in the 65-70% range  (b) echo on 11/01/2017 shows EF of  60-65%  (c) cardiogram 02/05/2018 shows an ejection fraction in the 60-65% range.  (d) echocardiogram 05/28/2018 shows an ejection fraction in the 65-70%.  (5) adjuvant radiation 10/17/2017-11/14/2016: 40.05 Gy directed to the Right Breast in 15 fractions, followed by a boost of 10 Gy given in 5 fractions   (6) anemia, with normal MCV, ferritin, B12, folate, and inappropriately normal reticulocyte count, consistent with anemia of chronic illness.   PLAN: Yeva is now 2 years out from definitive surgery for her breast cancer with no evidence of disease recurrence.  This is very favorable.  She is developing an excellent exercise program and the benefits are clearly showing.  She is considering and echo accidents and other supplements in an attempt to reduce the risk of breast cancer recurrence.  We discussed that in detail today.  She understands these are sold as foods and therefore not tested rigorously as medicines.  We have data that antioxidants can actually make cancer more likely to develop or recur.  I generally leave it up to my patients discretion whether they want to take supplements and I do appreciate their letting me know what they are taking so that perhaps in the future we could do a retrospective study and see if they benefited or actually were harmed by these supplements.  I think she would benefit from a survivorship visit and I am setting that up for 6 months from now.  We discussed her mammogram which still shows breast density C.  This will improve with time.    She understands the first 2 years from surgery are the most dangerous in triple negative cases.  She has weathered those 2 years well and that should be reassuring.  I have encouraged her to sign up for the coronavirus vaccine as soon as possible.  She will see me again in a year.  She knows to call for any other issue that may develop before then.  Total encounter time 25 minutes.*    Adayah Arocho,  Virgie Dad, MD   07/10/19 10:36 AM Medical Oncology and Hematology [facility] [address] [place], [place] [address] Tel. [phone]    Fax. [phone]   I, Wilburn Mylar, am acting as scribe for Dr. Virgie Dad. Taran Hable.  I, Lurline Del MD, have reviewed the above documentation for accuracy and completeness, and I agree with the above.    *Total Encounter Time as defined by the Centers for Medicare and Medicaid Services includes, in addition to the face-to-face time of a patient visit (documented in the note above) non-face-to-face time: obtaining and reviewing outside history, ordering and reviewing medications, tests or procedures, care coordination (communications with other health care professionals or caregivers) and documentation in the medical record.

## 2019-07-10 ENCOUNTER — Other Ambulatory Visit: Payer: Self-pay

## 2019-07-10 ENCOUNTER — Inpatient Hospital Stay: Payer: Medicare PPO | Admitting: Oncology

## 2019-07-10 ENCOUNTER — Inpatient Hospital Stay: Payer: Medicare PPO | Attending: Oncology

## 2019-07-10 VITALS — BP 154/86 | HR 86 | Temp 97.8°F | Resp 18 | Ht 63.5 in | Wt 153.0 lb

## 2019-07-10 DIAGNOSIS — Z8249 Family history of ischemic heart disease and other diseases of the circulatory system: Secondary | ICD-10-CM | POA: Insufficient documentation

## 2019-07-10 DIAGNOSIS — I10 Essential (primary) hypertension: Secondary | ICD-10-CM | POA: Diagnosis not present

## 2019-07-10 DIAGNOSIS — Z886 Allergy status to analgesic agent status: Secondary | ICD-10-CM | POA: Insufficient documentation

## 2019-07-10 DIAGNOSIS — Z888 Allergy status to other drugs, medicaments and biological substances status: Secondary | ICD-10-CM | POA: Diagnosis not present

## 2019-07-10 DIAGNOSIS — Z95828 Presence of other vascular implants and grafts: Secondary | ICD-10-CM | POA: Diagnosis not present

## 2019-07-10 DIAGNOSIS — Z882 Allergy status to sulfonamides status: Secondary | ICD-10-CM | POA: Diagnosis not present

## 2019-07-10 DIAGNOSIS — Z803 Family history of malignant neoplasm of breast: Secondary | ICD-10-CM | POA: Diagnosis not present

## 2019-07-10 DIAGNOSIS — Z79899 Other long term (current) drug therapy: Secondary | ICD-10-CM | POA: Insufficient documentation

## 2019-07-10 DIAGNOSIS — D649 Anemia, unspecified: Secondary | ICD-10-CM | POA: Insufficient documentation

## 2019-07-10 DIAGNOSIS — Z8041 Family history of malignant neoplasm of ovary: Secondary | ICD-10-CM | POA: Insufficient documentation

## 2019-07-10 DIAGNOSIS — I158 Other secondary hypertension: Secondary | ICD-10-CM | POA: Diagnosis not present

## 2019-07-10 DIAGNOSIS — C50411 Malignant neoplasm of upper-outer quadrant of right female breast: Secondary | ICD-10-CM

## 2019-07-10 DIAGNOSIS — Z171 Estrogen receptor negative status [ER-]: Secondary | ICD-10-CM

## 2019-07-10 DIAGNOSIS — Z8042 Family history of malignant neoplasm of prostate: Secondary | ICD-10-CM | POA: Insufficient documentation

## 2019-07-10 LAB — CBC WITH DIFFERENTIAL/PLATELET
Abs Immature Granulocytes: 0.03 10*3/uL (ref 0.00–0.07)
Basophils Absolute: 0.1 10*3/uL (ref 0.0–0.1)
Basophils Relative: 1 %
Eosinophils Absolute: 0.1 10*3/uL (ref 0.0–0.5)
Eosinophils Relative: 2 %
HCT: 36.4 % (ref 36.0–46.0)
Hemoglobin: 11.7 g/dL — ABNORMAL LOW (ref 12.0–15.0)
Immature Granulocytes: 1 %
Lymphocytes Relative: 45 %
Lymphs Abs: 1.9 10*3/uL (ref 0.7–4.0)
MCH: 28.5 pg (ref 26.0–34.0)
MCHC: 32.1 g/dL (ref 30.0–36.0)
MCV: 88.6 fL (ref 80.0–100.0)
Monocytes Absolute: 0.4 10*3/uL (ref 0.1–1.0)
Monocytes Relative: 10 %
Neutro Abs: 1.7 10*3/uL (ref 1.7–7.7)
Neutrophils Relative %: 41 %
Platelets: 242 10*3/uL (ref 150–400)
RBC: 4.11 MIL/uL (ref 3.87–5.11)
RDW: 13 % (ref 11.5–15.5)
WBC: 4.1 10*3/uL (ref 4.0–10.5)
nRBC: 0 % (ref 0.0–0.2)

## 2019-07-10 LAB — COMPREHENSIVE METABOLIC PANEL
ALT: 16 U/L (ref 0–44)
AST: 18 U/L (ref 15–41)
Albumin: 4 g/dL (ref 3.5–5.0)
Alkaline Phosphatase: 73 U/L (ref 38–126)
Anion gap: 12 (ref 5–15)
BUN: 11 mg/dL (ref 8–23)
CO2: 27 mmol/L (ref 22–32)
Calcium: 9.6 mg/dL (ref 8.9–10.3)
Chloride: 104 mmol/L (ref 98–111)
Creatinine, Ser: 0.98 mg/dL (ref 0.44–1.00)
GFR calc Af Amer: >60 mL/min (ref >60)
GFR calc non Af Amer: 59 mL/min — ABNORMAL LOW (ref >60)
Glucose, Bld: 97 mg/dL (ref 70–99)
Potassium: 4.4 mmol/L (ref 3.5–5.1)
Sodium: 143 mmol/L (ref 135–145)
Total Bilirubin: 0.2 mg/dL — ABNORMAL LOW (ref 0.3–1.2)
Total Protein: 7.5 g/dL (ref 6.5–8.1)

## 2019-07-11 ENCOUNTER — Telehealth: Payer: Self-pay | Admitting: Oncology

## 2019-07-11 NOTE — Telephone Encounter (Signed)
I talk with patient regarding schedule  

## 2019-12-01 DIAGNOSIS — H2512 Age-related nuclear cataract, left eye: Secondary | ICD-10-CM | POA: Diagnosis not present

## 2019-12-02 DIAGNOSIS — H2511 Age-related nuclear cataract, right eye: Secondary | ICD-10-CM | POA: Diagnosis not present

## 2019-12-10 DIAGNOSIS — Z780 Asymptomatic menopausal state: Secondary | ICD-10-CM | POA: Diagnosis not present

## 2019-12-10 DIAGNOSIS — M8589 Other specified disorders of bone density and structure, multiple sites: Secondary | ICD-10-CM | POA: Diagnosis not present

## 2019-12-26 ENCOUNTER — Other Ambulatory Visit (HOSPITAL_COMMUNITY): Payer: Self-pay | Admitting: Internal Medicine

## 2019-12-29 DIAGNOSIS — H2511 Age-related nuclear cataract, right eye: Secondary | ICD-10-CM | POA: Diagnosis not present

## 2020-01-02 ENCOUNTER — Telehealth: Payer: Self-pay | Admitting: Adult Health

## 2020-01-02 NOTE — Telephone Encounter (Signed)
Rescheduled 7/21 appt to 7/28 due to provider schedule change. Pt confirmed appt date and time.

## 2020-01-07 ENCOUNTER — Encounter: Payer: Medicare PPO | Admitting: Adult Health

## 2020-01-13 NOTE — Progress Notes (Deleted)
SURVIVORSHIP VISIT:   BRIEF ONCOLOGIC HISTORY:  Oncology History  Malignant neoplasm of upper-outer quadrant of right breast in female, estrogen receptor negative ([place])  06/20/2017 Initial Diagnosis    [place] woman status post right breast upper outer quadrant biopsy 06/20/2017 for a clinical T1b N0, stage IA invasive ductal carcinoma, grade 3, estrogen and progesterone receptor negative, but HER-2 amplified, with an MIB-1 of 30%                07/05/2017 Genetic Testing   The patient had genetic testing due to a personal history of breast cancer and family history of breast and ovarian cancer.  The Common Hereditary Cancer Panel was ordered. The Common Hereditary Cancer Panel offered by [facility] includes sequencing and/or deletion duplication testing of the following 47 genes: APC, ATM, AXIN2, BARD1, BMPR1A, BRCA1, BRCA2, BRIP1, CDH1, CDKN2A (p14ARF), CDKN2A (p16INK4a), CKD4, CHEK2, CTNNA1, DICER1, EPCAM (Deletion/duplication testing only), GREM1 (promoter region deletion/duplication testing only), KIT, MEN1, MLH1, MSH2, MSH3, MSH6, MUTYH, NBN, NF1, NHTL1, PALB2, PDGFRA, PMS2, POLD1, POLE, PTEN, RAD50, RAD51C, RAD51D, SDHB, SDHC, SDHD, SMAD4, SMARCA4. STK11, TP53, TSC1, TSC2, and VHL.  The following genes were evaluated for sequence changes only: SDHA and HOXB13 c.251G>A variant only.  Results: No pathogenic variants identified.  A Variant of uncertain significance in the gene MSH2 was identified c.1331G>T (p.Arg444Leu).  The date of this test report is 07/05/2017.   07/17/2017 Surgery   status post right lumpectomy and sentinel lymph node sampling for a pT1c pN0, stage IA invasive ductal carcinoma, grade 2, with negative margins.  A total of 5 lymph nodes were removed    08/07/2017 - 07/23/2018 Adjuvant Chemotherapy   adjuvant chemotherapy consisting of paclitaxel weekly x12 together with trastuzumab every 21 days starting 08/07/2017             (a) paclitaxel stopped after 8 doses because  of neuropathy (last dose 09/25/2017  (4) trastuzumab continued to total 1 year (last dose 07/23/2018)             (a) echo 08/10/2017 showed an ejection fraction in the 65-70% range             (b) echo on 11/01/2017 shows EF of 60-65%             (c) cardiogram 02/05/2018 shows an ejection fraction in the 60-65% range.             (d) echocardiogram 05/28/2018 shows an ejection fraction in the 65-70%.     10/17/2017 - 11/14/2017 Radiation Therapy   (5) adjuvant radiation: 40.05 Gy directed to the Right Breast in 15 fractions, followed by a boost of 10 Gy given in 5 fractions      INTERVAL HISTORY:  April [name] to review her survivorship care plan detailing her treatment course for breast cancer, as well as monitoring long-term side effects of that treatment, education regarding health maintenance, screening, and overall wellness and health promotion.     Overall, April [name] reports feeling quite well. REVIEW OF SYSTEMS:  Review of Systems - Oncology Breast: Denies any new nodularity, masses, tenderness, nipple changes, or nipple discharge.      ONCOLOGY TREATMENT TEAM:  1. Surgeon:  Dr. Donne Hazel at [facility] 2. Medical Oncologist: Dr. Jana Hakim  3. Radiation Oncologist: Dr. Isidore Moos    PAST MEDICAL/SURGICAL HISTORY:  Past Medical History:  Diagnosis Date  . Breast cancer ([place]) 05/2017   right breast  . Eczema   . Family history of breast cancer   .  Family history of ovarian cancer   . History of radiation therapy 10/17/17- 11/14/17   40.05 directed to the right breast in 15 fractions, followed by a boost of 10 gy given in 5 fractions.   . Hypertension    toxemia during pregnancy, no meds now   Past Surgical History:  Procedure Laterality Date  . ABDOMINAL HYSTERECTOMY    . BREAST LUMPECTOMY WITH RADIOACTIVE SEED AND SENTINEL LYMPH NODE BIOPSY Right 07/17/2017   Procedure: BREAST LUMPECTOMY WITH RADIOACTIVE SEED AND SENTINEL LYMPH NODE BIOPSY;  Surgeon:  Rolm Bookbinder, MD;  Location: [place];  Service: General;  Laterality: Right;  . CESAREAN SECTION     x4  . DILATION AND CURETTAGE OF UTERUS    . KNEE SURGERY Right   . PORT-A-CATH REMOVAL Right 09/04/2018   Procedure: REMOVAL PORT-A-CATH;  Surgeon: Rolm Bookbinder, MD;  Location: [place];  Service: General;  Laterality: Right;  . PORTACATH PLACEMENT N/A 07/17/2017   Procedure: INSERTION PORT-A-CATH WITH [place];  Surgeon: Rolm Bookbinder, MD;  Location: [place];  Service: General;  Laterality: N/A;     ALLERGIES:  Allergies  Allergen Reactions  . Tape Itching and Rash  . Nsaids   . Sulfa Antibiotics Rash     CURRENT MEDICATIONS:  Outpatient Encounter Medications as of 01/14/2020  Medication Sig  . aspirin 81 MG chewable tablet Chew 81 mg by mouth daily.  Marland Kitchen CALCIUM-VITAMIN D PO Take 1 tablet by mouth daily.  . colesevelam (WELCHOL) 625 MG tablet Take by mouth 2 (two) times daily with a meal.  . hydrochlorothiazide (MICROZIDE) 12.5 MG capsule TAKE 1 CAPSULE BY MOUTH EVERY DAY  . Nutritional Supplements (ANTIOXIDANT FORMULA) CAPS Take by mouth.  . vitamin B-12 (CYANOCOBALAMIN) 1000 MCG tablet Take 1,000 mcg by mouth daily.  . [DISCONTINUED] prochlorperazine (COMPAZINE) 10 MG tablet Take 1 tablet (10 mg total) by mouth every 6 (six) hours as needed (Nausea or vomiting).   No facility-administered encounter medications on file as of 01/14/2020.     ONCOLOGIC FAMILY HISTORY:  Family History  Problem Relation Age of Onset  . Breast cancer Mother 62       again at 8 in other breast   . Heart attack Father 74  . Breast cancer Sister 35  . Breast cancer Maternal Grandmother 40       spread to lungs, died at 46  . Ovarian cancer Cousin 78  . Prostate cancer Cousin      GENETIC COUNSELING/TESTING: See above  SOCIAL HISTORY:  Social History   Socioeconomic History  . Marital status: Married    Spouse name: Not on  file  . Number of children: Not on file  . Years of education: Not on file  . Highest education level: Not on file  Occupational History  . Not on file  Tobacco Use  . Smoking status: Never Smoker  . Smokeless tobacco: Never Used  Vaping Use  . Vaping Use: Never used  Substance and Sexual Activity  . Alcohol use: Yes    Comment: social  . Drug use: No  . Sexual activity: Not Currently  Other Topics Concern  . Not on file  Social History Narrative  . Not on file   Social Determinants of Health   Financial Resource Strain:   . Difficulty of Paying Living Expenses:   Food Insecurity:   . Worried About Charity fundraiser in the Last Year:   . [employer]  in the Last Year:   Transportation Needs:   . Film/video editor (Medical):   Marland Kitchen Lack of Transportation (Non-Medical):   Physical Activity:   . Days of Exercise per Week:   . Minutes of Exercise per Session:   Stress:   . Feeling of Stress :   Social Connections:   . Frequency of Communication with Friends and Family:   . Frequency of Social Gatherings with Friends and Family:   . Attends Religious Services:   . Active Member of Clubs or Organizations:   . Attends Archivist Meetings:   Marland Kitchen Marital Status:   Intimate Partner Violence:   . Fear of Current or Ex-Partner:   . Emotionally Abused:   Marland Kitchen Physically Abused:   . Sexually Abused:      OBSERVATIONS/OBJECTIVE:   LABORATORY DATA:  None for this visit.  DIAGNOSTIC IMAGING:  None for this visit.      ASSESSMENT AND PLAN:  Ms.. [name] is a pleasant 69 y.o. female with Stage IA right breast invasive ductal carcinoma, ER-/PR-/HER2+, diagnosed in 06/2017, treated with lumpectomy, adjuvant chemotherapy, and adjuvant radiation therapy.  She presents to the Survivorship Clinic for our initial meeting and routine follow-up post-completion of treatment for breast cancer.    1. Stage IA right breast cancer:  April [name] is continuing to recover  from definitive treatment for breast cancer. She will follow-up with her medical oncologist, Dr. Jana Hakim in 06/2019 with history and physical exam per surveillance protocol.  Her mammogram is due 05/2019; orders placed today.  Her breast density is category C. Today, a comprehensive survivorship care plan and treatment summary was reviewed with the patient today detailing her breast cancer diagnosis, treatment course, potential late/long-term effects of treatment, appropriate follow-up care with recommendations for the future, and patient education resources.  A copy of this summary, along with a letter will be sent to the patient's primary care provider via mail/fax/In Basket message after today's visit.    2. Bone health:  Given April [name]'s age/history of breast cancer, she is at risk for bone demineralization.  She has bone density testing regularly with Dr. Laurann [name].  She is unsure what her results are, but notes she is taking Calcium regularly as well as Vitamin D3.  She notes that her vitamin d level was low when tested with her labs in May and she is now taking 50,000 units weekly per her PCP. She was given education on specific activities to promote bone health.  3. Cancer screening:  Due to April [name]'s history and her age, she should receive screening for skin cancers, colon cancer, and gynecologic cancers.  The information and recommendations are listed on the patient's comprehensive care plan/treatment summary and were reviewed in detail with the patient.    4. Health maintenance and wellness promotion: April [name] was encouraged to consume 5-7 servings of fruits and vegetables per day. We reviewed the "Nutrition Rainbow" handout, as well as the handout "Take Control of Your Health and Reduce Your Cancer Risk" from the [facility].  She was also encouraged to engage in moderate to vigorous exercise for 30 minutes per day most days of the week. We discussed the [employer]  fitness program, which is designed for cancer survivors to help them become more physically fit after cancer treatments.  She was instructed to limit her alcohol consumption and continue to abstain from tobacco use.     5. Support services/counseling: It is not uncommon for this period of  the patient's cancer care trajectory to be one of many emotions and stressors.  We discussed how this can be increasingly difficult during the times of quarantine and social distancing due to the COVID-19 pandemic.   She was given information regarding our available services and encouraged to contact me with any questions or for help enrolling in any of our support group/programs.    Follow up instructions:    She knows to call for any questions that may arise between now and her next appointment.  We are happy to see her sooner if needed.  Wilber Bihari, NP 01/13/20 10:09 PM Medical Oncology and Hematology [facility] [place], [place] [address] Tel. [phone]    Fax. [phone]  *Total Encounter Time as defined by the Centers for Medicare and Medicaid Services includes, in addition to the face-to-face time of a patient visit (documented in the note above) non-face-to-face time: obtaining and reviewing outside history, ordering and reviewing medications, tests or procedures, care coordination (communications with other health care professionals or caregivers) and documentation in the medical record.

## 2020-01-14 ENCOUNTER — Inpatient Hospital Stay: Payer: Medicare PPO | Attending: Adult Health | Admitting: Adult Health

## 2020-01-14 ENCOUNTER — Other Ambulatory Visit: Payer: Self-pay

## 2020-01-14 VITALS — BP 147/55 | HR 85 | Temp 98.2°F | Resp 18 | Ht 63.5 in | Wt 155.2 lb

## 2020-01-14 DIAGNOSIS — Z171 Estrogen receptor negative status [ER-]: Secondary | ICD-10-CM

## 2020-01-14 DIAGNOSIS — C50411 Malignant neoplasm of upper-outer quadrant of right female breast: Secondary | ICD-10-CM

## 2020-01-14 DIAGNOSIS — Z803 Family history of malignant neoplasm of breast: Secondary | ICD-10-CM | POA: Diagnosis not present

## 2020-01-14 DIAGNOSIS — Z8041 Family history of malignant neoplasm of ovary: Secondary | ICD-10-CM | POA: Insufficient documentation

## 2020-01-14 DIAGNOSIS — Z8042 Family history of malignant neoplasm of prostate: Secondary | ICD-10-CM | POA: Diagnosis not present

## 2020-01-14 DIAGNOSIS — I10 Essential (primary) hypertension: Secondary | ICD-10-CM | POA: Insufficient documentation

## 2020-01-14 NOTE — Progress Notes (Signed)
CLINIC:  Survivorship   REASON FOR VISIT:  Routine follow-up for history of breast cancer.   BRIEF ONCOLOGIC HISTORY:  Oncology History  Malignant neoplasm of upper-outer quadrant of right breast in female, estrogen receptor negative ([place])  06/20/2017 Initial Diagnosis    [place] woman status post right breast upper outer quadrant biopsy 06/20/2017 for a clinical T1b N0, stage IA invasive ductal carcinoma, grade 3, estrogen and progesterone receptor negative, but HER-2 amplified, with an MIB-1 of 30%                07/05/2017 Genetic Testing   The patient had genetic testing due to a personal history of breast cancer and family history of breast and ovarian cancer.  The Common Hereditary Cancer Panel was ordered. The Common Hereditary Cancer Panel offered by [facility] includes sequencing and/or deletion duplication testing of the following 47 genes: APC, ATM, AXIN2, BARD1, BMPR1A, BRCA1, BRCA2, BRIP1, CDH1, CDKN2A (p14ARF), CDKN2A (p16INK4a), CKD4, CHEK2, CTNNA1, DICER1, EPCAM (Deletion/duplication testing only), GREM1 (promoter region deletion/duplication testing only), KIT, MEN1, MLH1, MSH2, MSH3, MSH6, MUTYH, NBN, NF1, NHTL1, PALB2, PDGFRA, PMS2, POLD1, POLE, PTEN, RAD50, RAD51C, RAD51D, SDHB, SDHC, SDHD, SMAD4, SMARCA4. STK11, TP53, TSC1, TSC2, and VHL.  The following genes were evaluated for sequence changes only: SDHA and HOXB13 c.251G>A variant only.  Results: No pathogenic variants identified.  A Variant of uncertain significance in the gene MSH2 was identified c.1331G>T (p.Arg444Leu).  The date of this test report is 07/05/2017.   07/17/2017 Surgery   status post right lumpectomy and sentinel lymph node sampling for a pT1c pN0, stage IA invasive ductal carcinoma, grade 2, with negative margins.  A total of 5 lymph nodes were removed    08/07/2017 - 07/23/2018 Adjuvant Chemotherapy   adjuvant chemotherapy consisting of paclitaxel weekly x12 together with trastuzumab every 21 days  starting 08/07/2017             (a) paclitaxel stopped after 8 doses because of neuropathy (last dose 09/25/2017  (4) trastuzumab continued to total 1 year (last dose 07/23/2018)             (a) echo 08/10/2017 showed an ejection fraction in the 65-70% range             (b) echo on 11/01/2017 shows EF of 60-65%             (c) cardiogram 02/05/2018 shows an ejection fraction in the 60-65% range.             (d) echocardiogram 05/28/2018 shows an ejection fraction in the 65-70%.     10/17/2017 - 11/14/2017 Radiation Therapy   (5) adjuvant radiation: 40.05 Gy directed to the Right Breast in 15 fractions, followed by a boost of 10 Gy given in 5 fractions       INTERVAL HISTORY:  Ms. Keesey presents to the [facility] today for routine follow-up for her history of breast cancer.  Overall, she reports feeling quite well. Her most recent mammogram was completed in 05/2019 at [facility] and showed no evidence of malignancy and breast density category C.  Another mammogram is due in 05/2020.  Overall April [name] is doing well.  She is up to date with seeing her PCP and remains active.  She has had a nice summer with traveling to [place] and the [place] visiting her children and watching her nephew play baseball.      REVIEW OF SYSTEMS:  Review of Systems  Constitutional: Negative for appetite change, chills, fatigue, fever  and unexpected weight change.  HENT:   Negative for hearing loss, lump/mass and trouble swallowing.   Eyes: Negative for eye problems and icterus.  Respiratory: Negative for chest tightness and shortness of breath.   Cardiovascular: Negative for chest pain, leg swelling and palpitations.  Gastrointestinal: Negative for abdominal distention, abdominal pain, constipation, diarrhea, nausea and vomiting.  Endocrine: Negative for hot flashes.  Genitourinary: Negative for difficulty urinating.   Musculoskeletal: Negative for arthralgias.  Skin: Negative for itching and rash.   Neurological: Negative for dizziness, extremity weakness, headaches and numbness.  Hematological: Negative for adenopathy. Does not bruise/bleed easily.  Psychiatric/Behavioral: Negative for depression. The patient is not nervous/anxious.   Breast: Denies any new nodularity, masses, tenderness, nipple changes, or nipple discharge.       PAST MEDICAL/SURGICAL HISTORY:  Past Medical History:  Diagnosis Date  . Breast cancer ([place]) 05/2017   right breast  . Eczema   . Family history of breast cancer   . Family history of ovarian cancer   . History of radiation therapy 10/17/17- 11/14/17   40.05 directed to the right breast in 15 fractions, followed by a boost of 10 gy given in 5 fractions.   . Hypertension    toxemia during pregnancy, no meds now   Past Surgical History:  Procedure Laterality Date  . ABDOMINAL HYSTERECTOMY    . BREAST LUMPECTOMY WITH RADIOACTIVE SEED AND SENTINEL LYMPH NODE BIOPSY Right 07/17/2017   Procedure: BREAST LUMPECTOMY WITH RADIOACTIVE SEED AND SENTINEL LYMPH NODE BIOPSY;  Surgeon: Rolm Bookbinder, MD;  Location: [place];  Service: General;  Laterality: Right;  . CESAREAN SECTION     x4  . DILATION AND CURETTAGE OF UTERUS    . KNEE SURGERY Right   . PORT-A-CATH REMOVAL Right 09/04/2018   Procedure: REMOVAL PORT-A-CATH;  Surgeon: Rolm Bookbinder, MD;  Location: [place];  Service: General;  Laterality: Right;  . PORTACATH PLACEMENT N/A 07/17/2017   Procedure: INSERTION PORT-A-CATH WITH [place];  Surgeon: Rolm Bookbinder, MD;  Location: [place];  Service: General;  Laterality: N/A;     ALLERGIES:  Allergies  Allergen Reactions  . Tape Itching and Rash  . Nsaids   . Sulfa Antibiotics Rash     CURRENT MEDICATIONS:  Outpatient Encounter Medications as of 01/14/2020  Medication Sig  . aspirin 81 MG chewable tablet Chew 81 mg by mouth daily.  Marland Kitchen CALCIUM-VITAMIN D PO Take 1 tablet by mouth daily.  .  hydrochlorothiazide (MICROZIDE) 12.5 MG capsule TAKE 1 CAPSULE BY MOUTH EVERY DAY  . Omega-3 Fatty Acids (FISH OIL) 1000 MG CAPS Take 1 capsule by mouth daily.  . vitamin B-12 (CYANOCOBALAMIN) 1000 MCG tablet Take 1,000 mcg by mouth daily.  . [DISCONTINUED] colesevelam (WELCHOL) 625 MG tablet Take by mouth 2 (two) times daily with a meal.  . [DISCONTINUED] Nutritional Supplements (ANTIOXIDANT FORMULA) CAPS Take by mouth.  . [DISCONTINUED] prochlorperazine (COMPAZINE) 10 MG tablet Take 1 tablet (10 mg total) by mouth every 6 (six) hours as needed (Nausea or vomiting).   No facility-administered encounter medications on file as of 01/14/2020.     ONCOLOGIC FAMILY HISTORY:  Family History  Problem Relation Age of Onset  . Breast cancer Mother 68       again at 32 in other breast   . Heart attack Father 42  . Breast cancer Sister 8  . Breast cancer Maternal Grandmother 61       spread to lungs, died at 1  .  Ovarian cancer Cousin 79  . Prostate cancer Cousin     GENETIC COUNSELING/TESTING: See above  SOCIAL HISTORY:  Social History   Socioeconomic History  . Marital status: Married    Spouse name: Not on file  . Number of children: Not on file  . Years of education: Not on file  . Highest education level: Not on file  Occupational History  . Not on file  Tobacco Use  . Smoking status: Never Smoker  . Smokeless tobacco: Never Used  Vaping Use  . Vaping Use: Never used  Substance and Sexual Activity  . Alcohol use: Yes    Comment: social  . Drug use: No  . Sexual activity: Not Currently  Other Topics Concern  . Not on file  Social History Narrative  . Not on file   Social Determinants of Health   Financial Resource Strain:   . Difficulty of Paying Living Expenses:   Food Insecurity:   . Worried About Charity fundraiser in the Last Year:   . Arboriculturist in the Last Year:   Transportation Needs:   . Film/video editor (Medical):   Marland Kitchen Lack of  Transportation (Non-Medical):   Physical Activity:   . Days of Exercise per Week:   . Minutes of Exercise per Session:   Stress:   . Feeling of Stress :   Social Connections:   . Frequency of Communication with Friends and Family:   . Frequency of Social Gatherings with Friends and Family:   . Attends Religious Services:   . Active Member of Clubs or Organizations:   . Attends Archivist Meetings:   Marland Kitchen Marital Status:   Intimate Partner Violence:   . Fear of Current or Ex-Partner:   . Emotionally Abused:   Marland Kitchen Physically Abused:   . Sexually Abused:       PHYSICAL EXAMINATION:  Vital Signs: Vitals:   01/14/20 1158  BP: (!) 147/55  Pulse: 85  Resp: 18  Temp: 98.2 F (36.8 C)  SpO2: 100%   Filed Weights   01/14/20 1158  Weight: 155 lb 3.2 oz (70.4 kg)   General: Well-nourished, well-appearing female in no acute distress.  Unaccompanied today.   HEENT: Head is normocephalic.  Pupils equal and reactive to light. Conjunctivae clear without exudate.  Sclerae anicteric. Oral mucosa is pink, moist.  Oropharynx is pink without lesions or erythema.  Lymph: No cervical, supraclavicular, or infraclavicular lymphadenopathy noted on palpation.  Cardiovascular: Regular rate and rhythm.Marland Kitchen Respiratory: Clear to auscultation bilaterally. Chest expansion symmetric; breathing non-labored.  Breast Exam:  -Left breast: No appreciable masses on palpation. No skin redness, thickening, or peau d'orange appearance; no nipple retraction or nipple discharge; .  -Right breast: No appreciable masses on palpation. No skin redness, thickening, or peau d'orange appearance; no nipple retraction or nipple discharge; mild distortion in symmetry at previous lumpectomy site well healed scar without erythema or nodularity. -Axilla: No axillary adenopathy bilaterally.  GI: Abdomen soft and round; non-tender, non-distended. Bowel sounds normoactive. No hepatosplenomegaly.   GU: Deferred.  Neuro: No  focal deficits. Steady gait.  Psych: Mood and affect normal and appropriate for situation.  MSK: No focal spinal tenderness to palpation, full range of motion in bilateral upper extremities Extremities: No edema. Skin: Warm and dry.  LABORATORY DATA:  None for this visit   DIAGNOSTIC IMAGING:  Most recent mammogram:       ASSESSMENT AND PLAN:  Ms.. April [name] is a pleasant 69 y.o.  female with history of Stage IA right breast invasive ductal carcinoma, ER-/PR-/HER2+, diagnosed in 06/2017, treated with lumpectomy, adjuvant chemotherapy, maintenance trastuzumab and adjuvant radiation therapy.  She presents to the Survivorship Clinic for surveillance and routine follow-up.   1. History of breast cancer:  Ms. Markwardt is currently clinically and radiographically without evidence of disease or recurrence of breast cancer. She will be due for mammogram in 05/2020; orders placed today.   She will return to the cancer center to see her medical oncologist, Dr. Jana Hakim, in 06/2020.  I encouraged her to call me with any questions or concerns before her next visit at the cancer center, and I would be happy to see her sooner, if needed.    2. Cancer screening:  Due to Ms. Tay's history and her age, she should receive screening for skin cancers, colon cancer, and gynecologic cancers. She was encouraged to follow-up with her PCP for appropriate cancer screenings.   3. Health maintenance and wellness promotion: Ms. Jakel was encouraged to consume 5-7 servings of fruits and vegetables per day. She was also encouraged to engage in moderate to vigorous exercise for 30 minutes per day most days of the week. She was instructed to limit her alcohol consumption and continue to abstain from tobacco use.     Dispo:  -Return to cancer center in 6 months for f/u with Dr. Jana Hakim -Mammogram in 05/2020   Total encounter time: 20 minutes*  Wilber Bihari, NP 01/15/20 12:27 PM Medical Oncology and  Hematology [facility] [place], [place] [address] Tel. [phone]    Fax. [phone]  *Total Encounter Time as defined by the Centers for Medicare and Medicaid Services includes, in addition to the face-to-face time of a patient visit (documented in the note above) non-face-to-face time: obtaining and reviewing outside history, ordering and reviewing medications, tests or procedures, care coordination (communications with other health care professionals or caregivers) and documentation in the medical record.   Note: PRIMARY CARE PROVIDER Kelton Pillar, [place] [phone]

## 2020-01-15 ENCOUNTER — Encounter: Payer: Self-pay | Admitting: Adult Health

## 2020-01-15 ENCOUNTER — Telehealth: Payer: Self-pay | Admitting: Adult Health

## 2020-01-15 NOTE — Telephone Encounter (Signed)
No 7/28 los. No changes made to pt's schedule.

## 2020-03-03 DIAGNOSIS — H40013 Open angle with borderline findings, low risk, bilateral: Secondary | ICD-10-CM | POA: Diagnosis not present

## 2020-04-23 DIAGNOSIS — N644 Mastodynia: Secondary | ICD-10-CM | POA: Diagnosis not present

## 2020-04-23 DIAGNOSIS — I10 Essential (primary) hypertension: Secondary | ICD-10-CM | POA: Diagnosis not present

## 2020-04-23 DIAGNOSIS — E7800 Pure hypercholesterolemia, unspecified: Secondary | ICD-10-CM | POA: Diagnosis not present

## 2020-06-15 ENCOUNTER — Other Ambulatory Visit: Payer: Self-pay | Admitting: Radiology

## 2020-06-15 DIAGNOSIS — Z853 Personal history of malignant neoplasm of breast: Secondary | ICD-10-CM | POA: Diagnosis not present

## 2020-06-15 DIAGNOSIS — C50912 Malignant neoplasm of unspecified site of left female breast: Secondary | ICD-10-CM | POA: Diagnosis not present

## 2020-06-15 DIAGNOSIS — Z170 Estrogen receptor positive status [ER+]: Secondary | ICD-10-CM | POA: Diagnosis not present

## 2020-06-15 DIAGNOSIS — R921 Mammographic calcification found on diagnostic imaging of breast: Secondary | ICD-10-CM | POA: Diagnosis not present

## 2020-06-23 DIAGNOSIS — Z853 Personal history of malignant neoplasm of breast: Secondary | ICD-10-CM | POA: Diagnosis not present

## 2020-06-25 ENCOUNTER — Other Ambulatory Visit: Payer: Self-pay | Admitting: General Surgery

## 2020-06-25 ENCOUNTER — Other Ambulatory Visit: Payer: Self-pay | Admitting: Oncology

## 2020-06-25 DIAGNOSIS — C50412 Malignant neoplasm of upper-outer quadrant of left female breast: Secondary | ICD-10-CM

## 2020-06-25 DIAGNOSIS — Z170 Estrogen receptor positive status [ER+]: Secondary | ICD-10-CM

## 2020-06-25 DIAGNOSIS — C50411 Malignant neoplasm of upper-outer quadrant of right female breast: Secondary | ICD-10-CM | POA: Diagnosis not present

## 2020-06-25 NOTE — Progress Notes (Signed)
Note from Dr Donne Hazel:  "I just saw her today. She just had recently treated her 2 pos right cancer. Now has er/her 2 pos left breast cancer. Genes negative before.  I think lump/sn/port reasonable to start (area appears to be small-but I dont have exact measurement yet).  Is there any way you could see her early next week Gus?  I will get scheduled in next two weeks"  I will see her 06/29/2020

## 2020-06-28 ENCOUNTER — Encounter: Payer: Self-pay | Admitting: *Deleted

## 2020-06-28 NOTE — Progress Notes (Signed)
April [name]  [phone] Fax:([phone]     ID: April [name] DOB: Jun 21, 1950  MR#: [id number]  EHM#:[id number]  Patient Care Team: Kelton Pillar, MD as PCP - General (Family Medicine) Jadene Stemmer, Virgie Dad, MD as Consulting Physician (Oncology) Rolm Bookbinder, MD as Consulting Physician (General Surgery) Eppie Gibson, MD as Attending Physician (Radiation Oncology) Newt Minion, MD as Consulting Physician (Orthopedic Surgery) Regal, Tamala Fothergill, DPM as Consulting Physician (Podiatry) Neldon Mc, Donnamarie Poag, MD as Consulting Physician (Allergy and Immunology) Mauro Kaufmann, RN as Oncology Nurse Navigator Rockwell Germany, RN as Oncology Nurse Navigator OTHER MD:  CHIEF COMPLAINT: Estrogen receptor negative breast cancer  CURRENT TREATMENT:   INTERVAL HISTORY: April [name] returns today for evaluation of her new left breast cancer, as well as follow-up of her right breast cancer.   Since her last visit, she underwent bilateral diagnostic mammography with tomography at [facility] on 06/15/2020 showing: breast density category C; this showed in the right breast evidence of postsurgical and radiation changes but this was all stable.  On the left however there was a new group of pleomorphic calcifications in the upper inner quadrant..  She proceeded to biopsy of the left breast area in question on 06/15/2020. Pathology from the procedure ([id number]) showed: invasive ductal carcinoma, grade 3; high-grade ductal carcinoma in situ with necrosis; lymphovascular invasion identified. Prognostic indicators significant for: estrogen receptor 40% positive with weak staining intensity; progesterone receptor 0% negative. Proliferation marker Ki67 of 45%. Her2 positive by immunohistochemistry (3+).  Her case was presented at the multidisciplinary breast cancer conference 06/23/2020.  At that time consideration of neoadjuvant or adjuvant chemotherapy, with breast conserving  surgery, and adjuvant radiation was proposed, to be followed by antiestrogens.  The patient has previously undergone extensive genetics testing with no deleterious mutation identified.  REVIEW OF SYSTEMS: April [name] tells me they had good holidays.  The new tumor was found through mammography and she had absolutely no symptoms related to that.  She is in a bit of shock she says but is very practical and wants to make sure she understands and participates in the overall plan.  She did develop neuropathy with her earlier chemotherapy but that has completely cleared.  She tells me her mother has been taking letrozole and tolerating that well so she may be interested in using that as her antiestrogen eventually.  She wanted reassurance that this cancer is curable at this point.  Detailed review of systems was otherwise stable   COVID 19 VACCINATION STATUS:    HISTORY OF CURRENT ILLNESS: From the original intake note:  "April [name]" had bilateral screening mammography at [facility] 06/06/2017.  This showed a possible mass in the right breast at the 11 o'clock position.  On 06/13/2017 she underwent right diagnostic mammography and ultrasonography.  Breast density was category C.  In the right breast at the 11 o'clock position there was a 1 cm area by mammography.  By ultrasound this confirmed a 1.0 cm irregular mass with lobulated margins in the upper outer quadrant of the right breast.  There was a second, 0.4 cm lobulated mass in the same quadrant.  The right axilla was sonographically benign.  On 06/20/2017 biopsy of the 2 right breast masses in question was performed.  The final pathology ([id number]) found the smaller mass to be only fibrocystic change.  This is felt to be concordant.  The larger mass however was an invasive ductal carcinoma, grade 3, estrogen and progesterone receptor negative, with an MIB-1  of 30%, and HER-2 amplified, with a signals ratio of 2.24.  The number per cell was 4.60.  The patient's  subsequent history is as detailed below.   PAST MEDICAL HISTORY: Past Medical History:  Diagnosis Date  . Breast cancer ([place]) 05/2017   right breast  . Eczema   . Family history of breast cancer   . Family history of ovarian cancer   . History of radiation therapy 10/17/17- 11/14/17   40.05 directed to the right breast in 15 fractions, followed by a boost of 10 gy given in 5 fractions.   . Hypertension    toxemia during pregnancy, no meds now    PAST SURGICAL HISTORY: Past Surgical History:  Procedure Laterality Date  . ABDOMINAL HYSTERECTOMY    . BREAST LUMPECTOMY WITH RADIOACTIVE SEED AND SENTINEL LYMPH NODE BIOPSY Right 07/17/2017   Procedure: BREAST LUMPECTOMY WITH RADIOACTIVE SEED AND SENTINEL LYMPH NODE BIOPSY;  Surgeon: Rolm Bookbinder, MD;  Location: [facility];  Service: General;  Laterality: Right;  . CESAREAN SECTION     x4  . DILATION AND CURETTAGE OF UTERUS    . KNEE SURGERY Right   . PORT-A-CATH REMOVAL Right 09/04/2018   Procedure: REMOVAL PORT-A-CATH;  Surgeon: Rolm Bookbinder, MD;  Location: [facility];  Service: General;  Laterality: Right;  . PORTACATH PLACEMENT N/A 07/17/2017   Procedure: INSERTION PORT-A-CATH WITH [place];  Surgeon: Rolm Bookbinder, MD;  Location: [facility];  Service: General;  Laterality: N/A;    FAMILY HISTORY Family History  Problem Relation Age of Onset  . Breast cancer Mother 64       again at 47 in other breast   . Heart attack Father 33  . Breast cancer Sister 82  . Breast cancer Maternal Grandmother 65       spread to lungs, died at 57  . Ovarian cancer Cousin 8  . Prostate cancer Cousin   The patient's father died at age 22 from a heart attack.  The patient's mother is currently living at age 41 (as of January 2019).  The patient had 1 sister who was diagnosed with breast cancer at age 22 and died from metastatic disease at age 18.  The patient has 1 brother.  In addition the  patient's mother was diagnosed with breast cancer at age 4, on the left side, and now has a right-sided breast cancer diagnosed in January 2019.  There is in addition a cousin with ovarian cancer diagnosed when she was 71 years old   GYNECOLOGIC HISTORY:  No LMP recorded. Patient has had a hysterectomy. Menarche age 28, first live birth age 24, the patient had 3 live births, 1 of whom survived only 2 days.  She underwent hysterectomy without salpingo-oophorectomy September 05, 1978.  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  Her husband April [name] his Theme park manager at [place].  The patient's daughter April [name] lives in [place] where she is an Tourist information centre manager.  The patient's daughter April [name] lives in [place] working for the department of defense.  The patient has 3 grandchildren, 1 of whom is in the seventh grade but is actually the captain of the eighth grade basketball team and is playing in a championship February 2020.   ADVANCED DIRECTIVES: Not in place   HEALTH MAINTENANCE: Social History  Tobacco Use  . Smoking status: Never Smoker  . Smokeless tobacco: Never Used  Vaping Use  . Vaping Use: Never used  Substance Use Topics  . Alcohol use: Yes    Comment: social  . Drug use: No     Colonoscopy: April 2018/[facility]  PAP: Status post hysterectomy  Bone density:   Allergies  Allergen Reactions  . Tape Itching and Rash  . Nsaids   . Sulfa Antibiotics Rash    Current Outpatient Medications  Medication Sig Dispense Refill  . aspirin 81 MG chewable tablet Chew 81 mg by mouth daily. (Patient not taking: Reported on 06/29/2020)    . CALCIUM-VITAMIN D PO Take 1 tablet by mouth daily.    Marland Kitchen co-enzyme Q-10 30 MG capsule Take 30 mg by mouth daily.    . hydrochlorothiazide (MICROZIDE) 12.5 MG capsule TAKE 1  CAPSULE BY MOUTH EVERY DAY 90 capsule 3  . Omega-3 Fatty Acids (FISH OIL) 1000 MG CAPS Take 1 capsule by mouth daily.    . vitamin B-12 (CYANOCOBALAMIN) 1000 MCG tablet Take 1,000 mcg by mouth daily.    . vitamin C (ASCORBIC ACID) 250 MG tablet Take 250 mg by mouth daily.     No current facility-administered medications for this visit.    OBJECTIVE: Middle-aged African-American woman who appears stated age 32:   06/29/20 1340  BP: (!) 155/73  Pulse: 91  Resp: 15  Temp: 98.1 F (36.7 C)  SpO2: 100%     Body mass index is 26.31 kg/m.    Wt Readings from Last 3 Encounters:  06/29/20 150 lb 14.4 oz (68.4 kg)  01/14/20 155 lb 3.2 oz (70.4 kg)  07/10/19 153 lb (69.4 kg)  ECOG FS:0 - Asymptomatic   Sclerae unicteric, EOMs intact Wearing a mask No cervical or supraclavicular adenopathy Lungs no rales or rhonchi Heart regular rate and rhythm Abd soft, nontender, positive bowel sounds MSK no focal spinal tenderness, no upper extremity lymphedema Neuro: nonfocal, well oriented, appropriate affect Breasts: The right breast is status post lumpectomy and radiation.  There is no evidence of local recurrence.  The left breast is status post recent biopsy.  I do not palpate a mass.  There are no skin or nipple changes of concern.  Both axillae are benign.   LAB RESULTS:  CMP     Component Value Date/Time   NA 140 06/29/2020 1318   K 4.0 06/29/2020 1318   CL 103 06/29/2020 1318   CO2 29 06/29/2020 1318   GLUCOSE 107 (H) 06/29/2020 1318   BUN 11 06/29/2020 1318   CREATININE 1.18 (H) 06/29/2020 1318   CREATININE 1.01 06/27/2017 0843   CALCIUM 10.5 (H) 06/29/2020 1318   PROT 8.1 06/29/2020 1318   ALBUMIN 4.1 06/29/2020 1318   AST 20 06/29/2020 1318   AST 16 06/27/2017 0843   ALT 18 06/29/2020 1318   ALT 14 06/27/2017 0843   ALKPHOS 69 06/29/2020 1318   BILITOT 0.4 06/29/2020 1318   BILITOT 0.2 06/27/2017 0843   GFRNONAA 50 (L) 06/29/2020 1318   GFRNONAA 56 (L) 06/27/2017  0843   GFRAA >60 07/10/2019 1008   GFRAA >60 06/27/2017 0843    No results found for: TOTALPROTELP, ALBUMINELP, A1GS, A2GS, BETS, BETA2SER, GAMS, MSPIKE, SPEI  No results found for: KPAFRELGTCHN, LAMBDASER, KAPLAMBRATIO  Lab Results  Component Value Date   WBC 4.9 06/29/2020   NEUTROABS 2.4 06/29/2020   HGB 12.4 06/29/2020   HCT 37.4 06/29/2020   MCV 88.6 06/29/2020  PLT 288 06/29/2020   No results found for: LABCA2  No components found for: KGYJEH631  No results for input(s): INR in the last 168 hours.  No results found for: LABCA2  No results found for: SHF026  No results found for: VZC588  No results found for: FOY774  No results found for: CA2729  No components found for: HGQUANT  No results found for: CEA1 / No results found for: CEA1   No results found for: AFPTUMOR  No results found for: CHROMOGRNA  No results found for: HGBA, HGBA2QUANT, HGBFQUANT, HGBSQUAN (Hemoglobinopathy evaluation)   No results found for: LDH  Lab Results  Component Value Date   IRON 77 05/21/2018   TIBC 404 05/21/2018   IRONPCTSAT 19 (L) 05/21/2018   (Iron and TIBC)  Lab Results  Component Value Date   FERRITIN 24 05/21/2018    Urinalysis    Component Value Date/Time   COLORURINE COLORLESS (A) 08/09/2017 2224   APPEARANCEUR CLEAR 08/09/2017 2224   LABSPEC 1.004 (L) 08/09/2017 2224   PHURINE 6.0 08/09/2017 [place] 08/09/2017 2224   HGBUR NEGATIVE 08/09/2017 2224   BILIRUBINUR NEGATIVE 08/09/2017 2224   KETONESUR NEGATIVE 08/09/2017 2224   PROTEINUR NEGATIVE 08/09/2017 2224   NITRITE NEGATIVE 08/09/2017 2224   LEUKOCYTESUR NEGATIVE 08/09/2017 2224    STUDIES: No results found.   ELIGIBLE FOR AVAILABLE RESEARCH PROTOCOL: no   ASSESSMENT: 70 y.o. [place] woman   RIGHT BREAST CANCER (0) status post right breast upper outer quadrant biopsy 06/20/2017 for a clinical T1b N0, stage IA invasive ductal carcinoma, grade 3,  estrogen and progesterone receptor negative, but HER-2 amplified, with an MIB-1 of 30%  (1) genetics testing 07/05/2017 through the Common Hereditary Cancer Panel offered by [facility] found no deleterious mutations in APC, ATM, AXIN2, BARD1, BMPR1A, BRCA1, BRCA2, BRIP1, CDH1, CDKN2A (p14ARF), CDKN2A (p16INK4a), CKD4, CHEK2, CTNNA1, DICER1, EPCAM (Deletion/duplication testing only), GREM1 (promoter region deletion/duplication testing only), KIT, MEN1, MLH1, MSH2, MSH3, MSH6, MUTYH, NBN, NF1, NHTL1, PALB2, PDGFRA, PMS2, POLD1, POLE, PTEN, RAD50, RAD51C, RAD51D, SDHB, SDHC, SDHD, SMAD4, SMARCA4. STK11, TP53, TSC1, TSC2, and VHL.  The following genes were evaluated for sequence changes only: SDHA and HOXB13 c.251G>A variant only.  (a) a Variant of uncertain significance in MSH2 was identified c.1331G>T (p.Arg444Leu).   (2) status post right lumpectomy and sentinel lymph node sampling 07/17/2017 for a pT1c pN0, stage IA invasive ductal carcinoma, grade 2, with negative margins.  A total of 5 lymph nodes were removed  (3) adjuvant chemotherapy consisting of paclitaxel weekly x12 together with trastuzumab every 21 days starting 08/07/2017  (a) paclitaxel stopped after 8 doses because of neuropathy (last dose 09/25/2017  (4) trastuzumab continued to total 1 year (last dose 07/23/2018)  (a) echo 08/10/2017 showed an ejection fraction in the 65-70% range  (b) echo on 11/01/2017 shows EF of 60-65%  (c) cardiogram 02/05/2018 shows an ejection fraction in the 60-65% range.  (d) echocardiogram 05/28/2018 shows an ejection fraction in the 65-70%.  (5) adjuvant radiation 10/17/2017-11/14/2016: 40.05 Gy directed to the Right Breast in 15 fractions, followed by a boost of 10 Gy given in 5 fractions   (6) anemia, with normal MCV, ferritin, B12, folate, and inappropriately normal reticulocyte count, consistent with anemia of chronic illness.  LEFT BREAST CANCER: (7) status post left breast upper inner quadrant biopsy  06/15/2020 for a clinical T1 N0 invasive ductal carcinoma, grade 3, weakly estrogen receptor positive, progesterone receptor negative, HER2 amplified, with an MIB-1 of  45%.  (8) definitive surgery pending  (9) adjuvant chemotherapy and anti-HER2 immunotherapy to follow  (a) echocardiogram  (10) anti-HER2 immunotherapy to be continued a minimum of 1 year  (11) adjuvant radiation  (12) to start letrozole at the completion of local treatment   PLAN: Sherman is now 2 years out from definitive surgery for her right breast cancer, which was treated with paclitaxel and anti-HER2 immunotherapy.  The paclitaxel had to be interrupted after 8 doses (two thirds of the planned treatment) with the development of neuropathy  She now has a contralateral breast cancer which is essentially identical to the previous one.  This new cancer was certainly in place, although not demonstrable by mammography, through the prior treatment with chemotherapy and immunotherapy and was able to survive those treatments.  That is a major concern.  We reviewed the fact that there is no direct connection between 1 breast and the other.  I do think this is a second primary very similar to the first which suggest there is a field defect involving both breasts.  However there is also the possibility that this may have gotten to the contralateral breast hematogenously and I am going to obtain a CT of the chest and a bone scan just to make sure we are not dealing with stage IV disease.  The plan is for breast conserving surgery tentatively scheduled for 07/13/2020.  She will have a port placed.  We have many options for chemotherapy but will have to avoid agents that cause neuropathy and so the standard carboplatin docetaxel treatment would have to be modified.  We can consider carboplatin and gemcitabine.  She also has not received cyclophosphamide or anthracyclines so Cytoxan/Adriamycin is an option except for the concern regarding  myocardial damage in someone also receiving anti-HER2 immunotherapy.  A third option would be CMF chemotherapy.  Similarly 1 would want to intensify the anti-HER2 treatment.  At a minimum she would receive trastuzumab with pertuzumab.  We could also consider T-DM1 for 14 doses.  We will be helpful to have the final pathology on hand to help [place] with these decisions.  In addition we will repeat the prognostic panel on the final pathology as frequently weak ER positivity on a biopsy proved to be triple negative on the lumpectomy  April [name] has a good understanding of this plan and is in agreement with it.  She will see me 07/26/2020 and I am tentatively scheduling her for chemotherapy the next day.  The orders will be written once we have the final pathology on hand  Total encounter time 65 minutes.*    Terez Freimark, Virgie Dad, MD  06/29/20 5:59 PM Medical Oncology and Hematology [facility] [place], [place] [address] Tel. [phone]    Fax. [phone]   I, Wilburn Mylar, am acting as scribe for Dr. Virgie Dad. Brande Uncapher.  I, Lurline Del MD, have reviewed the above documentation for accuracy and completeness, and I agree with the above.   *Total Encounter Time as defined by the Centers for Medicare and Medicaid Services includes, in addition to the face-to-face time of a patient visit (documented in the note above) non-face-to-face time: obtaining and reviewing outside history, ordering and reviewing medications, tests or procedures, care coordination (communications with other health care professionals or caregivers) and documentation in the medical record.

## 2020-06-29 ENCOUNTER — Inpatient Hospital Stay: Payer: Medicare PPO | Attending: Oncology | Admitting: Oncology

## 2020-06-29 ENCOUNTER — Ambulatory Visit: Payer: Medicare PPO | Attending: Family Medicine

## 2020-06-29 ENCOUNTER — Other Ambulatory Visit: Payer: Self-pay

## 2020-06-29 ENCOUNTER — Inpatient Hospital Stay: Payer: Medicare PPO

## 2020-06-29 VITALS — BP 155/73 | HR 91 | Temp 98.1°F | Resp 15 | Ht 63.5 in | Wt 150.9 lb

## 2020-06-29 DIAGNOSIS — G459 Transient cerebral ischemic attack, unspecified: Secondary | ICD-10-CM | POA: Diagnosis not present

## 2020-06-29 DIAGNOSIS — Z8249 Family history of ischemic heart disease and other diseases of the circulatory system: Secondary | ICD-10-CM | POA: Diagnosis not present

## 2020-06-29 DIAGNOSIS — Z171 Estrogen receptor negative status [ER-]: Secondary | ICD-10-CM

## 2020-06-29 DIAGNOSIS — Z888 Allergy status to other drugs, medicaments and biological substances status: Secondary | ICD-10-CM | POA: Diagnosis not present

## 2020-06-29 DIAGNOSIS — I10 Essential (primary) hypertension: Secondary | ICD-10-CM | POA: Insufficient documentation

## 2020-06-29 DIAGNOSIS — Z79899 Other long term (current) drug therapy: Secondary | ICD-10-CM | POA: Insufficient documentation

## 2020-06-29 DIAGNOSIS — Z8042 Family history of malignant neoplasm of prostate: Secondary | ICD-10-CM | POA: Diagnosis not present

## 2020-06-29 DIAGNOSIS — Z8041 Family history of malignant neoplasm of ovary: Secondary | ICD-10-CM | POA: Insufficient documentation

## 2020-06-29 DIAGNOSIS — Z170 Estrogen receptor positive status [ER+]: Secondary | ICD-10-CM | POA: Insufficient documentation

## 2020-06-29 DIAGNOSIS — G620 Drug-induced polyneuropathy: Secondary | ICD-10-CM | POA: Diagnosis not present

## 2020-06-29 DIAGNOSIS — G629 Polyneuropathy, unspecified: Secondary | ICD-10-CM | POA: Insufficient documentation

## 2020-06-29 DIAGNOSIS — Z803 Family history of malignant neoplasm of breast: Secondary | ICD-10-CM | POA: Diagnosis not present

## 2020-06-29 DIAGNOSIS — Z886 Allergy status to analgesic agent status: Secondary | ICD-10-CM | POA: Diagnosis not present

## 2020-06-29 DIAGNOSIS — T451X5A Adverse effect of antineoplastic and immunosuppressive drugs, initial encounter: Secondary | ICD-10-CM

## 2020-06-29 DIAGNOSIS — C50212 Malignant neoplasm of upper-inner quadrant of left female breast: Secondary | ICD-10-CM | POA: Diagnosis not present

## 2020-06-29 DIAGNOSIS — C50411 Malignant neoplasm of upper-outer quadrant of right female breast: Secondary | ICD-10-CM | POA: Diagnosis not present

## 2020-06-29 DIAGNOSIS — R293 Abnormal posture: Secondary | ICD-10-CM | POA: Diagnosis not present

## 2020-06-29 DIAGNOSIS — Z882 Allergy status to sulfonamides status: Secondary | ICD-10-CM | POA: Insufficient documentation

## 2020-06-29 DIAGNOSIS — C50412 Malignant neoplasm of upper-outer quadrant of left female breast: Secondary | ICD-10-CM | POA: Diagnosis not present

## 2020-06-29 LAB — CBC WITH DIFFERENTIAL/PLATELET
Abs Immature Granulocytes: 0.01 10*3/uL (ref 0.00–0.07)
Basophils Absolute: 0.1 10*3/uL (ref 0.0–0.1)
Basophils Relative: 1 %
Eosinophils Absolute: 0.1 10*3/uL (ref 0.0–0.5)
Eosinophils Relative: 2 %
HCT: 37.4 % (ref 36.0–46.0)
Hemoglobin: 12.4 g/dL (ref 12.0–15.0)
Immature Granulocytes: 0 %
Lymphocytes Relative: 38 %
Lymphs Abs: 1.8 10*3/uL (ref 0.7–4.0)
MCH: 29.4 pg (ref 26.0–34.0)
MCHC: 33.2 g/dL (ref 30.0–36.0)
MCV: 88.6 fL (ref 80.0–100.0)
Monocytes Absolute: 0.4 10*3/uL (ref 0.1–1.0)
Monocytes Relative: 9 %
Neutro Abs: 2.4 10*3/uL (ref 1.7–7.7)
Neutrophils Relative %: 50 %
Platelets: 288 10*3/uL (ref 150–400)
RBC: 4.22 MIL/uL (ref 3.87–5.11)
RDW: 13 % (ref 11.5–15.5)
WBC: 4.9 10*3/uL (ref 4.0–10.5)
nRBC: 0 % (ref 0.0–0.2)

## 2020-06-29 LAB — COMPREHENSIVE METABOLIC PANEL
ALT: 18 U/L (ref 0–44)
AST: 20 U/L (ref 15–41)
Albumin: 4.1 g/dL (ref 3.5–5.0)
Alkaline Phosphatase: 69 U/L (ref 38–126)
Anion gap: 8 (ref 5–15)
BUN: 11 mg/dL (ref 8–23)
CO2: 29 mmol/L (ref 22–32)
Calcium: 10.5 mg/dL — ABNORMAL HIGH (ref 8.9–10.3)
Chloride: 103 mmol/L (ref 98–111)
Creatinine, Ser: 1.18 mg/dL — ABNORMAL HIGH (ref 0.44–1.00)
GFR, Estimated: 50 mL/min — ABNORMAL LOW (ref >60)
Glucose, Bld: 107 mg/dL — ABNORMAL HIGH (ref 70–99)
Potassium: 4 mmol/L (ref 3.5–5.1)
Sodium: 140 mmol/L (ref 135–145)
Total Bilirubin: 0.4 mg/dL (ref 0.3–1.2)
Total Protein: 8.1 g/dL (ref 6.5–8.1)

## 2020-06-29 NOTE — Therapy (Signed)
[place], [place], [address] Phone: [phone]   Fax:  [phone]  Physical Therapy Evaluation  Patient Details  Name: April [name] MRN: [id number] Date of Birth: 08-12-1950 Referring Provider (PT): Dr. Donne Hazel   Encounter Date: 06/29/2020   PT End of Session - 06/29/20 1653    Visit Number 1    Number of Visits 2    Date for PT Re-Evaluation 08/24/20    PT Start Time 6720    PT Stop Time 1620    PT Time Calculation (min) 50 min           Past Medical History:  Diagnosis Date  . Breast cancer ([place]) 05/2017   right breast  . Eczema   . Family history of breast cancer   . Family history of ovarian cancer   . History of radiation therapy 10/17/17- 11/14/17   40.05 directed to the right breast in 15 fractions, followed by a boost of 10 gy given in 5 fractions.   . Hypertension    toxemia during pregnancy, no meds now    Past Surgical History:  Procedure Laterality Date  . ABDOMINAL HYSTERECTOMY    . BREAST LUMPECTOMY WITH RADIOACTIVE SEED AND SENTINEL LYMPH NODE BIOPSY Right 07/17/2017   Procedure: BREAST LUMPECTOMY WITH RADIOACTIVE SEED AND SENTINEL LYMPH NODE BIOPSY;  Surgeon: Rolm Bookbinder, MD;  Location: [facility];  Service: General;  Laterality: Right;  . CESAREAN SECTION     x4  . DILATION AND CURETTAGE OF UTERUS    . KNEE SURGERY Right   . PORT-A-CATH REMOVAL Right 09/04/2018   Procedure: REMOVAL PORT-A-CATH;  Surgeon: Rolm Bookbinder, MD;  Location: [facility];  Service: General;  Laterality: Right;  . PORTACATH PLACEMENT N/A 07/17/2017   Procedure: INSERTION PORT-A-CATH WITH [place];  Surgeon: Rolm Bookbinder, MD;  Location: [facility];  Service: General;  Laterality: N/A;    There were no vitals filed for this visit.    Subjective Assessment - 06/29/20 1530    Subjective Pt is here for pre-op baselines.  Pt was  diagnosed on Jun 16, 2020 with left Breast Cancer ER/PR neg, HER 2+  Surgery likely scheduled for Jan 25, but not confirmed yet.  Having a left lumpectomy and SLNB performed.  Prior right breast Cancer with lumpectomy and 5 LN removed on 07/17/17    Pertinent History Pt with prior history of right breast cancer  with  with surgery on 07/17/17 for right lumpectomy and SLNB with 5 LN removed.  Had chemo and radiation.    Currently in Pain? No/denies              [facility] PT Assessment - 06/29/20 0001      Assessment   Medical Diagnosis Left Breast Cancer    Referring Provider (PT) Dr. Donne Hazel    Hand Dominance Right    Prior Therapy yes      Precautions   Precautions --   risk for lymphedema bilaterally   Precaution Comments lymphedema risk      Restrictions   Weight Bearing Restrictions No    Other Position/Activity Restrictions no      Balance Screen   Has the patient fallen in the past 6 months No    Has the patient had a decrease in activity level because of a fear of falling?  No    Is the patient reluctant to leave their home because of a fear of falling?  No      Home Tourist information centre manager residence    Living Arrangements Spouse/significant other    Available Help at Discharge Family    Type of Home House      Prior Function   Level of Independence Independent    Vocation Retired    Leisure Scientist, clinical (histocompatibility and immunogenetics), vibration plate,osteo strong,      Cognition   Overall Cognitive Status Within Functional Limits for tasks assessed      Observation/Other Assessments   Observations slight right skin darkening from prior radiation      Sensation   Light Touch Appears Intact      Posture/Postural Control   Posture/Postural Control Postural limitations    Postural Limitations Rounded Shoulders;Forward head      AROM   Right Shoulder Extension 51 Degrees    Right Shoulder Flexion 160 Degrees    Right Shoulder ABduction 175 Degrees    Right Shoulder  Internal Rotation 78 Degrees    Right Shoulder External Rotation 90 Degrees    Left Shoulder Extension 57 Degrees    Left Shoulder Flexion 164 Degrees    Left Shoulder ABduction 180 Degrees    Left Shoulder Internal Rotation 80 Degrees    Left Shoulder External Rotation 88 Degrees             LYMPHEDEMA/ONCOLOGY QUESTIONNAIRE - 06/29/20 0001      Surgeries   Lumpectomy Date 07/17/17   right   Number Lymph Nodes Removed --   5 on right     Treatment   Active Chemotherapy Treatment No    Date --   2019-20   Past Chemotherapy Treatment Yes    Date --   2019-2020   Past Radiation Treatment --   May 2019   Current Hormone Treatment No    Past Hormone Therapy No      What other symptoms do you have   Are you Having Heaviness or Tightness No    Are you having Pain No    Are you having pitting edema No    Is it Hard or Difficult finding clothes that fit No    Do you have infections No      Right Upper Extremity Lymphedema   10 cm Proximal to Olecranon Process 29.5 cm    Olecranon Process 25 cm    15 cm Proximal to Ulnar Styloid Process 22.2 cm    10 cm Proximal to Ulnar Styloid Process 18.2 cm    Just Proximal to Ulnar Styloid Process 14.3 cm    Across Hand at [facility] 18 cm    At [place] of 2nd Digit 5.6 cm      Left Upper Extremity Lymphedema   10 cm Proximal to Olecranon Process 28.6 cm    Olecranon Process 25 cm    15 cm Proximal to Ulnar Styloid Process 21.1 cm    10 cm Proximal to Ulnar Styloid Process 17.5 cm    Just Proximal to Ulnar Styloid Process 14 cm    Across Hand at [facility] 17.5 cm    At [place] of 2nd Digit 5.6 cm           L-DEX FLOWSHEETS - 06/29/20 1600      L-DEX LYMPHEDEMA SCREENING   Measurement Type Bilateral    L-DEX MEASUREMENT EXTREMITY Upper Extremity    POSITION  Standing    DOMINANT SIDE Right    At Risk Side Left    BASELINE  SCORE (UNILATERAL) -0.2                  Objective measurements completed on  examination: See above findings.               PT Education - 06/29/20 1643    Education Details Pt was instructed in 4 post op exercises to begin when allowed by MD.  Suggested AARON flex and ER be done in supine.  Educated in [place] class, and scheduling SOZO screen every 3 months for 2 years.  Pt had therapy in 2019-20 so has a good understanding of these.    Person(s) Educated Patient    Methods Explanation;Demonstration;Handout    Comprehension Verbalized understanding;Returned demonstration               PT Long Term Goals - 06/29/20 1648      PT LONG TERM GOAL #1   Title Patient will demonstrate she has returned to baseline since surgery related to shoulder ROM and function.    Time [facility] Goals - 06/29/20 2956      Patient will be able to verbalize understanding of pertinent lymphedema risk reduction practices relevant to her diagnosis specifically related to skin care.   Time 1    Period Days    Status Achieved      Patient will be able to return demonstrate and/or verbalize understanding of the post-op home exercise program related to regaining shoulder range of motion.   Time 1    Status Achieved      Patient will be able to verbalize understanding of the importance of attending the postoperative After Breast Cancer Class for further lymphedema risk reduction education and therapeutic exercise.   Time 1    Period Days    Status Achieved                 Plan - 06/29/20 1655    Clinical Impression Statement Pt was seen for pre-surgical screening before surgery for left breast CA scheduled for lumpectomy tentatively on Jan. 25.  She had prior right breast cancer in 2019 and attended therapy at that time.  She was instructed in 4 post op exercises, and she was given the option of doing the ABC class again.  She will also set up SOZO screens every 3 months for 5 years to detect subclinical lymphedema.   She was advised to call with questions or concerns.    Stability/Clinical Decision Making Stable/Uncomplicated    Clinical Decision Making Low    Rehab Potential Excellent    PT Frequency 1x / week    PT Duration 8 weeks    PT Treatment/Interventions ADLs/Self Care Home Management;Therapeutic exercise;Patient/family education    PT Next Visit Plan Reassess shoulder ROM, circumferential measurements, Determine need for further PT    PT Home Exercise Plan 4 post op exercises, ABC class    Consulted and Agree with Plan of Care Patient          Patient will follow up at outpatient cancer rehab 3-4 weeks following surgery.  If the patient requires physical therapy at that time, a specific plan will be dictated and sent to the referring physician for approval. The patient was educated today on appropriate basic range of motion exercises to begin post operatively and the importance of attending the After Breast Cancer class following surgery.  Patient  was educated today on lymphedema risk reduction practices as it pertains to recommendations that will benefit the patient immediately following surgery.  She verbalized good understanding.    Patient will benefit from skilled therapeutic intervention in order to improve the following deficits and impairments:  Postural dysfunction,Decreased knowledge of precautions  Visit Diagnosis: Malignant neoplasm of upper-outer quadrant of left female breast, unspecified estrogen receptor status (HCC)  Abnormal posture     Problem List Patient Active Problem List   Diagnosis Date Noted  . Neuropathy due to chemotherapeutic drug (HCC) 07/23/2018  . Port-A-Cath in place 08/28/2017  . TIA (transient ischemic attack) 08/09/2017  . Hypertension   . Genetic testing 07/06/2017  . Family history of ovarian cancer   . Family history of breast cancer   . Malignant neoplasm of upper-outer quadrant of right breast in female, estrogen receptor negative (HCC)  06/26/2017    Waynette Buttery 06/29/2020, 5:00 PM  [facility] [address] [place], [place], [address] Phone: [phone]   Fax:  [phone]  Name: April [name] MRN: [id number] Date of Birth: 07/21/1950 Alvira Monday, PT 06/29/20 5:01 PM

## 2020-06-29 NOTE — Patient Instructions (Signed)
Patient was instructed today in a home exercise program today for post op shoulder range of motion. These included active assist shoulder flexion in sitting, scapular retraction, wall walking with shoulder abduction, and hands behind head external rotation.  She was encouraged to do these twice a day, holding 3 seconds and repeating 5 times when permitted by her physician.   Physical Therapy Information for After Breast Cancer Surgery/Treatment:   Lymphedema is a swelling condition that you may be at risk for in your arm if you have lymph nodes removed from the armpit area.  After a sentinel node biopsy, the risk is approximately 5-9% and is higher after an axillary node dissection.  There is treatment available for this condition and it is not life-threatening.  Contact your physician or physical therapist with concerns.  You may begin the 4 shoulder/posture exercises (see additional sheet) when permitted by your physician (typically a week after surgery).  If you have drains, you may need to wait until those are removed before beginning range of motion exercises.  A general recommendation is to not lift your arms above shoulder height until drains are removed.  These exercises should be done to your tolerance and gently.  This is not a "no pain/no gain" type of recovery so listen to your body and stretch into the range of motion that you can tolerate, stopping if you have pain.  If you are having immediate reconstruction, ask your plastic surgeon about doing exercises as he or she may want you to wait.  We encourage you to attend the free one time ABC (After Breast Cancer) class offered by [facility].  You will learn information related to lymphedema risk, prevention and treatment and additional exercises to regain mobility following surgery.  You can call [phone] for more information.  This is offered the 1st and 3rd Monday of each month.  You only attend the class one  time.  While undergoing any medical procedure or treatment, try to avoid blood pressure being taken or needle sticks from occurring on the arm on the side of cancer.   This recommendation begins after surgery and continues for the rest of your life.  This may help reduce your risk of getting lymphedema (swelling in your arm).  An excellent resource for those seeking information on lymphedema is the National Lymphedema Network's web site. It can be accessed at www.lymphnet.org  If you notice swelling in your hand, arm or breast at any time following surgery (even if it is many years from now), please contact your doctor or physical therapist to discuss this.  Lymphedema can be treated at any time but it is easier for you if it is treated early on.  If you feel like your shoulder motion is not returning to normal in a reasonable amount of time, please contact your surgeon or physical therapist.  Marti C. Smith, PT, CLT [phone]; [address] [place], [place] [address] ABC CLASS After Breast Cancer Class  After Breast Cancer Class is a specially designed exercise class to assist you in a safe recover after having breast cancer surgery.  In this class you will learn how to get back to full function whether your drains were just removed or if you had surgery a month ago.  This one-time class is held the 1st and 3rd Monday of every month from 11:00 a.m. until 12:00 noon at the [facility] located at [address] [place], [place] [address]    This class is FREE and space is limited. For more information or to register for the next available class, call [phone].  Class Goals   Understand specific stretches to improve the flexibility of you chest and shoulder.  Learn ways to safely strengthen your upper body and improve your posture.  Understand the warning signs of infection and why you may be at risk for an arm infection.  Learn about Lymphedema and  prevention.  ** You do not attend this class until after surgery.  Drains must be removed to participate  Patient was instructed today in a home exercise program today for post op shoulder range of motion. These included active assist shoulder flexion in sitting, scapular retraction, wall walking with shoulder abduction, and hands behind head external rotation.  She was encouraged to do these twice a day, holding 3 seconds and repeating 5 times when permitted by her physician.     

## 2020-06-30 ENCOUNTER — Other Ambulatory Visit: Payer: Self-pay | Admitting: General Surgery

## 2020-06-30 ENCOUNTER — Encounter: Payer: Self-pay | Admitting: *Deleted

## 2020-06-30 DIAGNOSIS — C50412 Malignant neoplasm of upper-outer quadrant of left female breast: Secondary | ICD-10-CM

## 2020-06-30 DIAGNOSIS — Z170 Estrogen receptor positive status [ER+]: Secondary | ICD-10-CM

## 2020-07-01 ENCOUNTER — Telehealth: Payer: Self-pay | Admitting: Oncology

## 2020-07-01 NOTE — Telephone Encounter (Signed)
Scheduled appts per 1/11 los. Left voicemail with appt date and time.  

## 2020-07-02 ENCOUNTER — Other Ambulatory Visit: Payer: Self-pay | Admitting: Oncology

## 2020-07-02 NOTE — Progress Notes (Unsigned)
I consulted with the [facility] group as to how they would manage Ms April [name]: here are answers from Dr Lebron Quam  "I vote surgery first to define if the optimal adjuvant regimen and assuming she is fit with good estimated life expectancy, recommend TH followed by RT and ET if truly T1, M0.  My $0.02."  And from Dr. Janan Halter:  "I am okay with that.  I am also okay with T-DM1 if you could get it given similar results in ATTEMPT and her prior PN with Taxol".  Also opinion of Dr Alinda Money:  "If she is a good [age] I would consider Taxol(again) and HP this time. Assume RT and endocrine planned. I will send to my colleagues for their input and copy you. Also assume genetic testing as bilateral BC.  I will plan to use T-DM!

## 2020-07-06 ENCOUNTER — Encounter (HOSPITAL_BASED_OUTPATIENT_CLINIC_OR_DEPARTMENT_OTHER): Payer: Self-pay | Admitting: General Surgery

## 2020-07-06 ENCOUNTER — Other Ambulatory Visit: Payer: Self-pay

## 2020-07-08 ENCOUNTER — Encounter (HOSPITAL_COMMUNITY)
Admission: RE | Admit: 2020-07-08 | Discharge: 2020-07-08 | Disposition: A | Discharge status: Home / Self Care | Payer: Medicare PPO | Source: Ambulatory Visit | Attending: Oncology | Admitting: Oncology

## 2020-07-08 ENCOUNTER — Other Ambulatory Visit: Payer: Self-pay

## 2020-07-08 ENCOUNTER — Encounter: Payer: Self-pay | Admitting: *Deleted

## 2020-07-08 ENCOUNTER — Ambulatory Visit (HOSPITAL_COMMUNITY)
Admission: RE | Admit: 2020-07-08 | Discharge: 2020-07-08 | Disposition: A | Discharge status: Home / Self Care | Payer: Medicare PPO | Source: Ambulatory Visit | Attending: Oncology | Admitting: Oncology

## 2020-07-08 DIAGNOSIS — G459 Transient cerebral ischemic attack, unspecified: Secondary | ICD-10-CM | POA: Diagnosis not present

## 2020-07-08 DIAGNOSIS — I10 Essential (primary) hypertension: Secondary | ICD-10-CM

## 2020-07-08 DIAGNOSIS — M19031 Primary osteoarthritis, right wrist: Secondary | ICD-10-CM | POA: Diagnosis not present

## 2020-07-08 DIAGNOSIS — G620 Drug-induced polyneuropathy: Secondary | ICD-10-CM

## 2020-07-08 DIAGNOSIS — Z171 Estrogen receptor negative status [ER-]: Secondary | ICD-10-CM | POA: Diagnosis not present

## 2020-07-08 DIAGNOSIS — T451X5A Adverse effect of antineoplastic and immunosuppressive drugs, initial encounter: Secondary | ICD-10-CM | POA: Insufficient documentation

## 2020-07-08 DIAGNOSIS — C50411 Malignant neoplasm of upper-outer quadrant of right female breast: Secondary | ICD-10-CM | POA: Diagnosis not present

## 2020-07-08 DIAGNOSIS — M19022 Primary osteoarthritis, left elbow: Secondary | ICD-10-CM | POA: Diagnosis not present

## 2020-07-08 DIAGNOSIS — C50919 Malignant neoplasm of unspecified site of unspecified female breast: Secondary | ICD-10-CM | POA: Diagnosis not present

## 2020-07-08 DIAGNOSIS — C50912 Malignant neoplasm of unspecified site of left female breast: Secondary | ICD-10-CM | POA: Diagnosis not present

## 2020-07-08 MED ORDER — TECHNETIUM TC 99M MEDRONATE IV KIT
21.1000 | PACK | Freq: Once | INTRAVENOUS | Status: AC
  Administered 2020-07-08: 21.1 via INTRAVENOUS

## 2020-07-08 MED ORDER — IOHEXOL 300 MG/ML  SOLN
75.0000 mL | Freq: Once | INTRAMUSCULAR | Status: AC | PRN
  Administered 2020-07-08: 75 mL via INTRAVENOUS

## 2020-07-09 ENCOUNTER — Other Ambulatory Visit (HOSPITAL_COMMUNITY)
Admission: RE | Admit: 2020-07-09 | Discharge: 2020-07-09 | Disposition: A | Discharge status: Home / Self Care | Payer: Medicare PPO | Source: Ambulatory Visit | Attending: General Surgery | Admitting: General Surgery

## 2020-07-09 ENCOUNTER — Encounter: Payer: Self-pay | Admitting: *Deleted

## 2020-07-09 ENCOUNTER — Encounter (HOSPITAL_BASED_OUTPATIENT_CLINIC_OR_DEPARTMENT_OTHER)
Admission: RE | Admit: 2020-07-09 | Discharge: 2020-07-09 | Disposition: A | Discharge status: Home / Self Care | Payer: Medicare PPO | Source: Ambulatory Visit | Attending: General Surgery | Admitting: General Surgery

## 2020-07-09 DIAGNOSIS — Z20822 Contact with and (suspected) exposure to covid-19: Secondary | ICD-10-CM | POA: Diagnosis not present

## 2020-07-09 DIAGNOSIS — Z01818 Encounter for other preprocedural examination: Secondary | ICD-10-CM | POA: Diagnosis not present

## 2020-07-09 LAB — SARS CORONAVIRUS 2 (TAT 6-24 HRS): SARS Coronavirus 2: NEGATIVE

## 2020-07-09 MED ORDER — ENSURE PRE-SURGERY PO LIQD: 296.0000 mL | Freq: Once | ORAL | Status: DC

## 2020-07-09 NOTE — Progress Notes (Signed)

## 2020-07-12 ENCOUNTER — Other Ambulatory Visit: Payer: Self-pay

## 2020-07-12 ENCOUNTER — Other Ambulatory Visit: Payer: Self-pay | Admitting: Oncology

## 2020-07-12 ENCOUNTER — Ambulatory Visit
Admission: RE | Admit: 2020-07-12 | Discharge: 2020-07-12 | Disposition: A | Discharge status: Home / Self Care | Payer: Medicare PPO | Source: Ambulatory Visit | Attending: General Surgery | Admitting: General Surgery

## 2020-07-12 ENCOUNTER — Other Ambulatory Visit: Payer: Medicare PPO

## 2020-07-12 ENCOUNTER — Ambulatory Visit: Payer: Medicare PPO | Admitting: Oncology

## 2020-07-12 DIAGNOSIS — Z170 Estrogen receptor positive status [ER+]: Secondary | ICD-10-CM

## 2020-07-12 DIAGNOSIS — C50912 Malignant neoplasm of unspecified site of left female breast: Secondary | ICD-10-CM | POA: Diagnosis not present

## 2020-07-13 ENCOUNTER — Ambulatory Visit
Admission: RE | Admit: 2020-07-13 | Discharge: 2020-07-13 | Disposition: A | Discharge status: Home / Self Care | Payer: Medicare PPO | Source: Ambulatory Visit | Attending: General Surgery | Admitting: General Surgery

## 2020-07-13 ENCOUNTER — Ambulatory Visit (HOSPITAL_COMMUNITY): Payer: Medicare PPO

## 2020-07-13 ENCOUNTER — Encounter (HOSPITAL_BASED_OUTPATIENT_CLINIC_OR_DEPARTMENT_OTHER): Payer: Self-pay | Admitting: General Surgery

## 2020-07-13 ENCOUNTER — Ambulatory Visit (HOSPITAL_BASED_OUTPATIENT_CLINIC_OR_DEPARTMENT_OTHER): Payer: Medicare PPO | Admitting: Anesthesiology

## 2020-07-13 ENCOUNTER — Ambulatory Visit (HOSPITAL_COMMUNITY)
Admission: RE | Admit: 2020-07-13 | Discharge: 2020-07-13 | Disposition: A | Discharge status: Home / Self Care | Payer: Medicare PPO | Source: Ambulatory Visit | Attending: General Surgery | Admitting: General Surgery

## 2020-07-13 ENCOUNTER — Ambulatory Visit (HOSPITAL_BASED_OUTPATIENT_CLINIC_OR_DEPARTMENT_OTHER)
Admission: RE | Admit: 2020-07-13 | Discharge: 2020-07-13 | Disposition: A | Discharge status: Home / Self Care | Payer: Medicare PPO | Attending: General Surgery | Admitting: General Surgery

## 2020-07-13 ENCOUNTER — Other Ambulatory Visit: Payer: Self-pay

## 2020-07-13 ENCOUNTER — Encounter (HOSPITAL_BASED_OUTPATIENT_CLINIC_OR_DEPARTMENT_OTHER)
Admission: RE | Disposition: A | Discharge status: Home / Self Care | Payer: Self-pay | Source: Home / Self Care | Attending: General Surgery

## 2020-07-13 DIAGNOSIS — I10 Essential (primary) hypertension: Secondary | ICD-10-CM | POA: Diagnosis not present

## 2020-07-13 DIAGNOSIS — Z95828 Presence of other vascular implants and grafts: Secondary | ICD-10-CM

## 2020-07-13 DIAGNOSIS — Z808 Family history of malignant neoplasm of other organs or systems: Secondary | ICD-10-CM | POA: Insufficient documentation

## 2020-07-13 DIAGNOSIS — Z90711 Acquired absence of uterus with remaining cervical stump: Secondary | ICD-10-CM | POA: Insufficient documentation

## 2020-07-13 DIAGNOSIS — R928 Other abnormal and inconclusive findings on diagnostic imaging of breast: Secondary | ICD-10-CM | POA: Diagnosis not present

## 2020-07-13 DIAGNOSIS — C50912 Malignant neoplasm of unspecified site of left female breast: Secondary | ICD-10-CM | POA: Insufficient documentation

## 2020-07-13 DIAGNOSIS — Z170 Estrogen receptor positive status [ER+]: Secondary | ICD-10-CM

## 2020-07-13 DIAGNOSIS — C50411 Malignant neoplasm of upper-outer quadrant of right female breast: Secondary | ICD-10-CM | POA: Diagnosis not present

## 2020-07-13 DIAGNOSIS — C50412 Malignant neoplasm of upper-outer quadrant of left female breast: Secondary | ICD-10-CM

## 2020-07-13 DIAGNOSIS — Z803 Family history of malignant neoplasm of breast: Secondary | ICD-10-CM | POA: Diagnosis not present

## 2020-07-13 DIAGNOSIS — G8918 Other acute postprocedural pain: Secondary | ICD-10-CM | POA: Diagnosis not present

## 2020-07-13 DIAGNOSIS — Z452 Encounter for adjustment and management of vascular access device: Secondary | ICD-10-CM | POA: Diagnosis not present

## 2020-07-13 HISTORY — PX: PORTACATH PLACEMENT: SHX2246

## 2020-07-13 HISTORY — PX: BREAST LUMPECTOMY WITH RADIOACTIVE SEED AND SENTINEL LYMPH NODE BIOPSY: SHX6550

## 2020-07-13 SURGERY — BREAST LUMPECTOMY WITH RADIOACTIVE SEED AND SENTINEL LYMPH NODE BIOPSY
Anesthesia: General | Site: Chest | Laterality: Right

## 2020-07-13 MED ORDER — EPHEDRINE 5 MG/ML INJ
INTRAVENOUS | Status: AC
  Filled 2020-07-13: qty 10

## 2020-07-13 MED ORDER — TECHNETIUM TC 99M TILMANOCEPT KIT
1.0000 | PACK | Freq: Once | INTRAVENOUS | Status: AC | PRN
  Administered 2020-07-13: 1 via INTRADERMAL

## 2020-07-13 MED ORDER — TRAMADOL HCL 50 MG PO TABS: 50.0000 mg | ORAL_TABLET | Freq: Four times a day (QID) | ORAL | 0 refills | Status: DC | PRN

## 2020-07-13 MED ORDER — ACETAMINOPHEN 500 MG PO TABS
1000.0000 mg | ORAL_TABLET | ORAL | Status: AC
  Administered 2020-07-13: 1000 mg via ORAL

## 2020-07-13 MED ORDER — DEXAMETHASONE SODIUM PHOSPHATE 10 MG/ML IJ SOLN
INTRAMUSCULAR | Status: AC
  Filled 2020-07-13: qty 1

## 2020-07-13 MED ORDER — LIDOCAINE 2% (20 MG/ML) 5 ML SYRINGE
INTRAMUSCULAR | Status: AC
  Filled 2020-07-13: qty 5

## 2020-07-13 MED ORDER — BUPIVACAINE-EPINEPHRINE (PF) 0.5% -1:200000 IJ SOLN
INTRAMUSCULAR | Status: DC | PRN
  Administered 2020-07-13: 30 mL

## 2020-07-13 MED ORDER — FENTANYL CITRATE (PF) 100 MCG/2ML IJ SOLN
100.0000 ug | Freq: Once | INTRAMUSCULAR | Status: AC
Start: 2020-07-13 — End: 2020-07-13
  Administered 2020-07-13: 100 ug via INTRAVENOUS

## 2020-07-13 MED ORDER — LACTATED RINGERS IV SOLN: INTRAVENOUS | Status: DC

## 2020-07-13 MED ORDER — LIDOCAINE 2% (20 MG/ML) 5 ML SYRINGE
INTRAMUSCULAR | Status: DC | PRN
  Administered 2020-07-13: 60 mg via INTRAVENOUS

## 2020-07-13 MED ORDER — HEPARIN SOD (PORK) LOCK FLUSH 100 UNIT/ML IV SOLN
INTRAVENOUS | Status: DC | PRN
  Administered 2020-07-13: 450 [IU] via INTRAVENOUS

## 2020-07-13 MED ORDER — MIDAZOLAM HCL 2 MG/2ML IJ SOLN
INTRAMUSCULAR | Status: AC
  Filled 2020-07-13: qty 2

## 2020-07-13 MED ORDER — FENTANYL CITRATE (PF) 100 MCG/2ML IJ SOLN
INTRAMUSCULAR | Status: AC
  Filled 2020-07-13: qty 2

## 2020-07-13 MED ORDER — CEFAZOLIN SODIUM-DEXTROSE 2-4 GM/100ML-% IV SOLN
2.0000 g | INTRAVENOUS | Status: AC
  Administered 2020-07-13: 2 g via INTRAVENOUS

## 2020-07-13 MED ORDER — FENTANYL CITRATE (PF) 100 MCG/2ML IJ SOLN: 25.0000 ug | INTRAMUSCULAR | Status: DC | PRN

## 2020-07-13 MED ORDER — CEFAZOLIN SODIUM-DEXTROSE 2-4 GM/100ML-% IV SOLN
INTRAVENOUS | Status: AC
  Filled 2020-07-13: qty 100

## 2020-07-13 MED ORDER — ACETAMINOPHEN 500 MG PO TABS
ORAL_TABLET | ORAL | Status: AC
  Filled 2020-07-13: qty 2

## 2020-07-13 MED ORDER — EPHEDRINE SULFATE-NACL 50-0.9 MG/10ML-% IV SOSY
PREFILLED_SYRINGE | INTRAVENOUS | Status: DC | PRN
  Administered 2020-07-13 (×2): 5 mg via INTRAVENOUS
  Administered 2020-07-13: 10 mg via INTRAVENOUS
  Administered 2020-07-13 (×3): 5 mg via INTRAVENOUS

## 2020-07-13 MED ORDER — PROPOFOL 10 MG/ML IV BOLUS
INTRAVENOUS | Status: DC | PRN
  Administered 2020-07-13: 130 mg via INTRAVENOUS

## 2020-07-13 MED ORDER — ONDANSETRON HCL 4 MG/2ML IJ SOLN
INTRAMUSCULAR | Status: AC
  Filled 2020-07-13: qty 2

## 2020-07-13 MED ORDER — ONDANSETRON HCL 4 MG/2ML IJ SOLN
INTRAMUSCULAR | Status: DC | PRN
  Administered 2020-07-13: 4 mg via INTRAVENOUS

## 2020-07-13 MED ORDER — BUPIVACAINE HCL (PF) 0.25 % IJ SOLN
INTRAMUSCULAR | Status: DC | PRN
  Administered 2020-07-13: 9 mL

## 2020-07-13 MED ORDER — HEPARIN (PORCINE) IN NACL 2-0.9 UNITS/ML
INTRAMUSCULAR | Status: AC | PRN
  Administered 2020-07-13: 1 via INTRAVENOUS

## 2020-07-13 MED ORDER — MIDAZOLAM HCL 2 MG/2ML IJ SOLN
1.0000 mg | Freq: Once | INTRAMUSCULAR | Status: AC
  Administered 2020-07-13: 1 mg via INTRAVENOUS

## 2020-07-13 MED ORDER — FENTANYL CITRATE (PF) 100 MCG/2ML IJ SOLN
INTRAMUSCULAR | Status: DC | PRN
  Administered 2020-07-13 (×2): 25 ug via INTRAVENOUS

## 2020-07-13 MED ORDER — DEXAMETHASONE SODIUM PHOSPHATE 4 MG/ML IJ SOLN
INTRAMUSCULAR | Status: DC | PRN
  Administered 2020-07-13: 10 mg via INTRAVENOUS

## 2020-07-13 SURGICAL SUPPLY — 60 items
ADH SKN CLS APL DERMABOND .7 (GAUZE/BANDAGES/DRESSINGS) ×2
APL PRP STRL LF DISP 70% ISPRP (MISCELLANEOUS) ×2
APL SKNCLS STERI-STRIP NONHPOA (GAUZE/BANDAGES/DRESSINGS)
APPLIER CLIP 9.375 MED OPEN (MISCELLANEOUS) ×3
APR CLP MED 9.3 20 MLT OPN (MISCELLANEOUS) ×1
BAG DECANTER FOR FLEXI CONT (MISCELLANEOUS) ×3 IMPLANT
BENZOIN TINCTURE PRP APPL 2/3 (GAUZE/BANDAGES/DRESSINGS) IMPLANT
BINDER BREAST XLRG (GAUZE/BANDAGES/DRESSINGS) IMPLANT
BINDER BREAST XXLRG (GAUZE/BANDAGES/DRESSINGS) IMPLANT
BLADE SURG 11 STRL SS (BLADE) ×3 IMPLANT
BLADE SURG 15 STRL LF DISP TIS (BLADE) ×2 IMPLANT
BLADE SURG 15 STRL SS (BLADE) ×3
CANISTER SUCT 1200ML W/VALVE (MISCELLANEOUS) ×3 IMPLANT
CHLORAPREP W/TINT 26 (MISCELLANEOUS) ×3 IMPLANT
CLIP APPLIE 9.375 MED OPEN (MISCELLANEOUS) ×2 IMPLANT
COVER BACK TABLE 60X90IN (DRAPES) ×3 IMPLANT
COVER MAYO STAND STRL (DRAPES) ×3 IMPLANT
COVER PROBE 5X48 (MISCELLANEOUS) ×3
COVER PROBE W GEL 5X96 (DRAPES) ×3 IMPLANT
DECANTER SPIKE VIAL GLASS SM (MISCELLANEOUS) IMPLANT
DERMABOND ADVANCED (GAUZE/BANDAGES/DRESSINGS) ×1
DERMABOND ADVANCED .7 DNX12 (GAUZE/BANDAGES/DRESSINGS) ×2 IMPLANT
DRAPE C-ARM 42X72 X-RAY (DRAPES) ×3 IMPLANT
DRAPE GAMMA PROBE CRDLSS 10X38 (DRAPES) ×3 IMPLANT
DRAPE LAPAROSCOPIC ABDOMINAL (DRAPES) ×3 IMPLANT
DRAPE UTILITY XL STRL (DRAPES) ×3 IMPLANT
DRSG TEGADERM 4X4.75 (GAUZE/BANDAGES/DRESSINGS) IMPLANT
ELECT COATED BLADE 2.86 ST (ELECTRODE) ×3 IMPLANT
ELECT REM PT RETURN 9FT ADLT (ELECTROSURGICAL) ×3
ELECTRODE REM PT RTRN 9FT ADLT (ELECTROSURGICAL) ×2 IMPLANT
GAUZE SPONGE 4X4 12PLY STRL LF (GAUZE/BANDAGES/DRESSINGS) IMPLANT
GLOVE SURG ENC MOIS LTX SZ7 (GLOVE) ×9 IMPLANT
GLOVE SURG UNDER POLY LF SZ7.5 (GLOVE) ×9 IMPLANT
GOWN STRL REUS W/ TWL LRG LVL3 (GOWN DISPOSABLE) ×6 IMPLANT
GOWN STRL REUS W/TWL LRG LVL3 (GOWN DISPOSABLE) ×9
KIT CVR 48X5XPRB PLUP LF (MISCELLANEOUS) ×2 IMPLANT
KIT MARKER MARGIN INK (KITS) ×3 IMPLANT
KIT PORT POWER 8FR ISP CVUE (Port) ×3 IMPLANT
NEEDLE HYPO 25X1 1.5 SAFETY (NEEDLE) ×3 IMPLANT
NS IRRIG 1000ML POUR BTL (IV SOLUTION) IMPLANT
PACK BASIN DAY SURGERY FS (CUSTOM PROCEDURE TRAY) ×3 IMPLANT
PENCIL SMOKE EVACUATOR (MISCELLANEOUS) ×3 IMPLANT
RETRACTOR ONETRAX LX 90X20 (MISCELLANEOUS) ×3 IMPLANT
SLEEVE SCD COMPRESS KNEE MED (MISCELLANEOUS) ×3 IMPLANT
SPONGE LAP 4X18 RFD (DISPOSABLE) ×12 IMPLANT
STRIP CLOSURE SKIN 1/2X4 (GAUZE/BANDAGES/DRESSINGS) ×3 IMPLANT
SUT MNCRL AB 4-0 PS2 18 (SUTURE) ×3 IMPLANT
SUT MON AB 5-0 PS2 18 (SUTURE) ×3 IMPLANT
SUT PROLENE 2 0 SH DA (SUTURE) ×3 IMPLANT
SUT SILK 2 0 SH (SUTURE) ×6 IMPLANT
SUT VIC AB 2-0 SH 27 (SUTURE) ×9
SUT VIC AB 2-0 SH 27XBRD (SUTURE) ×6 IMPLANT
SUT VIC AB 3-0 SH 27 (SUTURE) ×9
SUT VIC AB 3-0 SH 27X BRD (SUTURE) ×6 IMPLANT
SYR 5ML LUER SLIP (SYRINGE) ×3 IMPLANT
SYR CONTROL 10ML LL (SYRINGE) ×3 IMPLANT
TOWEL GREEN STERILE FF (TOWEL DISPOSABLE) ×3 IMPLANT
TRAY FAXITRON CT DISP (TRAY / TRAY PROCEDURE) ×3 IMPLANT
TUBE CONNECTING 20X1/4 (TUBING) ×3 IMPLANT
YANKAUER SUCT BULB TIP NO VENT (SUCTIONS) ×3 IMPLANT

## 2020-07-13 NOTE — Anesthesia Procedure Notes (Signed)
Procedure Name: LMA Insertion Date/Time: 07/13/2020 10:11 AM Performed by: Lieutenant Diego, CRNA Pre-anesthesia Checklist: Patient identified, Emergency Drugs available, Suction available and Patient being monitored Patient Re-evaluated:Patient Re-evaluated prior to induction Oxygen Delivery Method: Circle system utilized Preoxygenation: Pre-oxygenation with 100% oxygen Induction Type: IV induction Ventilation: Mask ventilation without difficulty LMA: LMA inserted LMA Size: 4.0 Number of attempts: 1 Placement Confirmation: positive ETCO2 and breath sounds checked- equal and bilateral Tube secured with: Tape Dental Injury: Teeth and Oropharynx as per pre-operative assessment

## 2020-07-13 NOTE — Anesthesia Procedure Notes (Signed)
Anesthesia Regional Block: Pectoralis block   Pre-Anesthetic Checklist: ,, timeout performed, Correct Patient, Correct Site, Correct Laterality, Correct Procedure, Correct Position, site marked, Risks and benefits discussed, pre-op evaluation,  At surgeon's request and post-op pain management  Laterality: Left  Prep: Maximum Sterile Barrier Precautions used, chloraprep       Needles:  Injection technique: Single-shot  Needle Type: Echogenic Stimulator Needle     Needle Length: 9cm  Needle Gauge: 21     Additional Needles:   Procedures:,,,, ultrasound used (permanent image in chart),,,,  Narrative:  Start time: 07/13/2020 8:56 AM End time: 07/13/2020 9:06 AM Injection made incrementally with aspirations every 5 mL. Anesthesiologist: Roderic Palau, MD  Additional Notes: 2% Lidocaine skin wheel.

## 2020-07-13 NOTE — Anesthesia Preprocedure Evaluation (Signed)
Anesthesia Evaluation  Patient identified by MRN, date of birth, ID band Patient awake    Reviewed: Allergy & Precautions, H&P , NPO status , Patient's Chart, lab work & pertinent test results  Airway Mallampati: II  TM Distance: >3 FB Neck ROM: Full    Dental no notable dental hx. (+) Teeth Intact, Dental Advisory Given   Pulmonary neg pulmonary ROS,    Pulmonary exam normal breath sounds clear to auscultation       Cardiovascular hypertension, Pt. on medications  Rhythm:Regular Rate:Normal     Neuro/Psych negative neurological ROS  negative psych ROS   GI/Hepatic negative GI ROS, Neg liver ROS,   Endo/Other  negative endocrine ROS  Renal/GU negative Renal ROS  negative genitourinary   Musculoskeletal   Abdominal   Peds  Hematology negative hematology ROS (+)   Anesthesia Other Findings   Reproductive/Obstetrics negative OB ROS                             Anesthesia Physical Anesthesia Plan  ASA: II  Anesthesia Plan: General   Post-op Pain Management:  Regional for Post-op pain   Induction: Intravenous  PONV Risk Score and Plan: 4 or greater and Ondansetron, Dexamethasone and Midazolam  Airway Management Planned: LMA  Additional Equipment:   Intra-op Plan:   Post-operative Plan: Extubation in OR  Informed Consent: I have reviewed the patients History and Physical, chart, labs and discussed the procedure including the risks, benefits and alternatives for the proposed anesthesia with the patient or authorized representative who has indicated his/her understanding and acceptance.     Dental advisory given  Plan Discussed with: CRNA  Anesthesia Plan Comments:         Anesthesia Quick Evaluation

## 2020-07-13 NOTE — Discharge Instructions (Signed)
Next dose of Tylenol can be given after 2:30PM.      Post Anesthesia Home Care Instructions  Activity: Get plenty of rest for the remainder of the day. A responsible individual must stay with you for 24 hours following the procedure.  For the next 24 hours, DO NOT: -Drive a car -Paediatric nurse -Drink alcoholic beverages -Take any medication unless instructed by your physician -Make any legal decisions or sign important papers.  Meals: Start with liquid foods such as gelatin or soup. Progress to regular foods as tolerated. Avoid greasy, spicy, heavy foods. If nausea and/or vomiting occur, drink only clear liquids until the nausea and/or vomiting subsides. Call your physician if vomiting continues.  Special Instructions/Symptoms: Your throat may feel dry or sore from the anesthesia or the breathing tube placed in your throat during surgery. If this causes discomfort, gargle with warm salt water. The discomfort should disappear within 24 hours.  If you had a scopolamine patch placed behind your ear for the management of post- operative nausea and/or vomiting:  1. The medication in the patch is effective for 72 hours, after which it should be removed.  Wrap patch in a tissue and discard in the trash. Wash hands thoroughly with soap and water. 2. You may remove the patch earlier than 72 hours if you experience unpleasant side effects which may include dry mouth, dizziness or visual disturbances. 3. Avoid touching the patch. Wash your hands with soap and water after contact with the patch.         [place] Office Phone Number [phone]  BREAST BIOPSY/ PARTIAL MASTECTOMY: POST OP INSTRUCTIONS Take 400 mg of ibuprofen every 8 hours or 650 mg tylenol every 6 hours for next 72 hours then as needed. Use ice several times daily also. Always review your discharge instruction sheet given to you by the facility where your surgery was performed.  IF YOU HAVE  DISABILITY OR FAMILY LEAVE FORMS, YOU MUST BRING THEM TO THE OFFICE FOR PROCESSING.  DO NOT GIVE THEM TO YOUR DOCTOR.  1. A prescription for pain medication may be given to you upon discharge.  Take your pain medication as prescribed, if needed.  If narcotic pain medicine is not needed, then you may take acetaminophen (Tylenol), naprosyn (Alleve) or ibuprofen (Advil) as needed. 2. Take your usually prescribed medications unless otherwise directed 3. If you need a refill on your pain medication, please contact your pharmacy.  They will contact our office to request authorization.  Prescriptions will not be filled after 5pm or on week-ends. 4. You should eat very light the first 24 hours after surgery, such as soup, crackers, pudding, etc.  Resume your normal diet the day after surgery. 5. Most patients will experience some swelling and bruising in the breast.  Ice packs and a good support bra will help.  Wear the breast binder provided or a sports bra for 72 hours day and night.  After that wear a sports bra during the day until you return to the office. Swelling and bruising can take several days to resolve.  6. It is common to experience some constipation if taking pain medication after surgery.  Increasing fluid intake and taking a stool softener will usually help or prevent this problem from occurring.  A mild laxative (Milk of Magnesia or Miralax) should be taken according to package directions if there are no bowel movements after 48 hours. 7. Unless discharge instructions indicate otherwise, you may remove your bandages 48 hours after  surgery and you may shower at that time.  You may have steri-strips (small skin tapes) in place directly over the incision.  These strips should be left on the skin for 7-10 days and will come off on their own.  If your surgeon used skin glue on the incision, you may shower in 24 hours.  The glue will flake off over the next 2-3 weeks.  Any sutures or staples will be  removed at the office during your follow-up visit. 8. ACTIVITIES:  You may resume regular daily activities (gradually increasing) beginning the next day.  Wearing a good support bra or sports bra minimizes pain and swelling.  You may have sexual intercourse when it is comfortable. a. You may drive when you no longer are taking prescription pain medication, you can comfortably wear a seatbelt, and you can safely maneuver your car and apply brakes. b. RETURN TO WORK:  ______________________________________________________________________________________ 9. You should see your doctor in the office for a follow-up appointment approximately two weeks after your surgery.  Your doctors nurse will typically make your follow-up appointment when she calls you with your pathology report.  Expect your pathology report 3-4 business days after your surgery.  You may call to check if you do not hear from [place] after three days. 10. OTHER INSTRUCTIONS: _______________________________________________________________________________________________ _____________________________________________________________________________________________________________________________________ _____________________________________________________________________________________________________________________________________ _____________________________________________________________________________________________________________________________________  WHEN TO CALL DR WAKEFIELD: 1. Fever over 101.0 2. Nausea and/or vomiting. 3. Extreme swelling or bruising. 4. Continued bleeding from incision. 5. Increased pain, redness, or drainage from the incision.  The clinic staff is available to answer your questions during regular business hours.  Please dont hesitate to call and ask to speak to one of the nurses for clinical concerns.  If you have a medical emergency, go to the nearest emergency room or call 911.  A surgeon from [facility] is always on call at the hospital.  For further questions, please visit [email or web address]

## 2020-07-13 NOTE — Progress Notes (Signed)
Assisted Dr. Oren Bracket with left, ultrasound guided, pectoralis block. Side rails up, monitors on throughout procedure. See vital signs in flow sheet. Tolerated Procedure well.

## 2020-07-13 NOTE — Anesthesia Postprocedure Evaluation (Signed)
Anesthesia Post Note  Patient: April [name]  Procedure(s) Performed: LEFT BREAST LUMPECTOMY WITH RADIOACTIVE SEED AND LEFT AXILLARY SENTINEL LYMPH NODE BIOPSY (Left Breast) INSERTION PORT-A-CATH WITH ULTRASOUND GUIDANCE (Right Chest)     Patient location during evaluation: PACU Anesthesia Type: General and Regional Level of consciousness: awake and alert Pain management: pain level controlled Vital Signs Assessment: post-procedure vital signs reviewed and stable Respiratory status: spontaneous breathing, nonlabored ventilation and respiratory function stable Cardiovascular status: blood pressure returned to baseline and stable Postop Assessment: no apparent nausea or vomiting Anesthetic complications: no   No complications documented.  Last Vitals:  Vitals:   07/13/20 1215 07/13/20 1230  BP: (!) 157/75 (!) 159/79  Pulse: 94 89  Resp: 17 14  Temp:    SpO2: 100% 100%    Last Pain:  Vitals:   07/13/20 1230  TempSrc:   PainSc: 0-No pain                 Derico Mitton,W. EDMOND

## 2020-07-13 NOTE — H&P (Signed)
18 yof s/p right lump/sn and port placement in 2019. she had a 1.6 cm grade II IDC with negative margins. 5 sn negative. is er/pr negative and her2 positive with ki of 30. genetics was negative. she then underwent adjuvant chemotherapy. she completed trastumuzab. she has completed radiotherapy as well in May 2019. port is out now. she has no mass or dc. she got regular mammogram with right side noted to have postop changes and the left side has a cluster of calcs (I dont have mm and I dont have a size). axilla was negative. she underwent core biopsy that shows idc with hg dcis and LVI that is er pos at 21, pr negative, her 2 positive and Ki is 45%. she is here to discuss options today   Past Surgical History Rolm Bookbinder, MD; 06/25/2020 10:18 AM) Breast Biopsy  Right. Hysterectomy (not due to cancer) - Partial   Allergies Mallie Snooks, CMA; 06/25/2020 9:20 AM) Adhesive 1"x6yd *MEDICAL DEVICES AND SUPPLIES*  Allergies Reconciled   Medication History Mallie Snooks, CMA; 06/25/2020 9:22 AM) hydroCHLOROthiazide (12.5MG Capsule, Oral) Active. Calcium Gluconate (600MG Tablet, Oral) Active. Vitamin B-12 (1000MCG Tablet, Oral) Active. Fish Oil (1000MG Capsule, Oral) Active. Vitamin C (100MG Tablet, 1 (one) Oral) Active. Calcium/Vitamin D/Minerals (1 (one) Oral) Specific strength unknown - Active. Red Yeast Rice (600MG Capsule, 1 (one) Oral) Active. Co Q 10 (100MG Capsule, Oral) Active. Medications Reconciled  Social History Rolm Bookbinder, MD; 06/25/2020 10:18 AM) Alcohol use  Occasional alcohol use. Caffeine use  Carbonated beverages, Coffee, Tea. No drug use  Tobacco use  Never smoker.  Family History Rolm Bookbinder, MD; 06/25/2020 10:18 AM) Breast Cancer  Family Members In General, Mother, Sister. Depression  Mother. Heart Disease  Father. Hypertension  Father. Melanoma  Mother. Respiratory Condition  Brother. Thyroid problems   Mother.  Vitals Mallie Snooks CMA; 06/25/2020 9:22 AM) 06/25/2020 9:22 AM Weight: 155.13 lb Height: 63in Body Surface Area: 1.74 m Body Mass Index: 27.48 kg/m  Temp.: 97.44F  Pulse: 96 (Regular)  P.OX: 96% (Room air) BP: 142/82(Sitting, Left Arm, Standard)       Physical Exam Rolm Bookbinder MD; 06/25/2020 9:57 AM) General Mental Status-Alert. Orientation-Oriented X3.  Breast Nipples-No Discharge. Breast Lump-No Palpable Breast Mass.    Lymphatic Head & Neck  General Head & Neck Lymphatics: Bilateral - Description - Normal. Axillary  General Axillary Region: Bilateral - Description - Normal. Note: no [place] adenopathy     Assessment & Plan Rolm Bookbinder MD; 06/25/2020 10:18 AM) BREAST CANCER OF UPPER-OUTER QUADRANT OF RIGHT FEMALE BREAST (C50.411) Left lumpectomy/sn biopsy, port We discussed the staging and pathophysiology of breast cancer. We discussed all of the different options for treatment for breast cancer including surgery, chemotherapy, radiation therapy, Herceptin, and antiestrogen therapy. We discussed a sentinel lymph node biopsy as she does not appear to having lymph node involvement right now. We discussed the performance of that with injection of radioactive tracer. We discussed that there is a chance of having a positive node with a sentinel lymph node biopsy and we will await the permanent pathology to make any other first further decisions in terms of her treatment. We discussed up to a 5% risk lifetime of chronic shoulder pain as well as lymphedema associated with a sentinel lymph node biopsy. We discussed the options for treatment of the breast cancer which included lumpectomy versus a mastectomy. We discussed the performance of the lumpectomy with radioactive seed placement. We discussed a 5-10% chance of a positive margin requiring reexcision  in the operating room. We also discussed that she will likely need radiation therapy if  she undergoes lumpectomy. We discussed mastectomy and the postoperative care for that as well. Mastectomy can be followed by reconstruction. The decision for lumpectomy vs mastectomy has no impact on decision for chemotherapy. Most mastectomy patients will not need radiation therapy. We discussed that there is no difference in her survival whether she undergoes lumpectomy with radiation therapy or antiestrogen therapy versus a mastectomy. There is also no real difference between her recurrence in the breast. We discussed the risks of operation including bleeding, infection, possible reoperation.

## 2020-07-13 NOTE — Interval H&P Note (Signed)
History and Physical Interval Note:  07/13/2020 9:42 AM  April [name]  has presented today for surgery, with the diagnosis of LEFT BREAST CANCER.  The various methods of treatment have been discussed with the patient and family. After consideration of risks, benefits and other options for treatment, the patient has consented to  Procedure(s) with comments: LEFT BREAST LUMPECTOMY WITH RADIOACTIVE SEED AND LEFT AXILLARY SENTINEL LYMPH NODE BIOPSY (Left) - PECTORAL BLOCK INSERTION PORT-A-CATH WITH ULTRASOUND GUIDANCE (N/A) as a surgical intervention.  The patient's history has been reviewed, patient examined, no change in status, stable for surgery.  I have reviewed the patient's chart and labs.  Questions were answered to the patient's satisfaction.     Rolm Bookbinder

## 2020-07-13 NOTE — Op Note (Signed)
Preoperative diagnosis: Clinical stage I left breast cancer Postoperative diagnosis: Same as above Procedure: 1.  Right internal jugular port placement with ultrasound guidance 2.  Left breast radioactive seed guided lumpectomy 3.  Left axillary sentinel lymph node biopsy, deep Surgeon: Dr. Serita Grammes Anesthesia: General with a left pectoral block Estimated blood loss: 30 cc Specimens: 1.  Left deep axillary lymph nodes with highest count of 1798 2.  Left breast radioactive seed guided lumpectomy marked with paint containing seed and clip 3.  Additional left breast anterior, posterior, inferior, medial margins marked short superior, long lateral, double deep Special count was correct completion Drains: None Disposition recovery stable condition  Indications:69 yof s/p right lump/sn and port placement in 2019. she had a 1.6 cm grade II IDC with negative margins. 5 sn negative. is er/pr negative and her2 positive with ki of 30. genetics was negative. she then underwent adjuvant chemotherapy. she completed trastumuzab. she has completed radiotherapy as well in May 2019. port is out now. she has no mass or dc. she got regular mammogram with right side noted to have postop changes and the left side has a cluster of calcs  axilla was negative. she underwent core biopsy that shows idc with hg dcis and LVI that is er pos at 69, pr negative, her 2 positive and Ki is 45%.  We discussed proceeding with a lumpectomy, sentinel lymph node biopsy, as well as port placement due to HER-2 positivity.  Procedure: After informed consent was obtained the patient first underwent a pectoral block.  She was administered Lymphoseek in the standard periareolar fashion.  She already had a radioactive seed placed and I had these mammograms available in the operating room.  She was given antibiotics.  SCDs were placed.  She was placed under general anesthesia without complication.  She was prepped and draped in  the standard sterile surgical fashion.  A surgical timeout was then performed.  I first did the port.  I used the ultrasound to identify the right internal jugular vein.  Under ultrasound guidance I then accessed the vein with the needle.  I passed the wire.  The wire was in the vein both by ultrasound and by fluoroscopy.  I then reentered her old incision on her right chest from her prior port and made a pocket again.  I tunneled the line between the 2 sites.  I then placed the dilator over the wire.  I observed this with fluoroscopy to go in the correct position.  I then remove the wire.  I then passed the line.  The peel-away sheath was removed.  I pulled the line back to be in the superior vena cava.  I then attached the port.  I sutured this into place with 2-0 Prolene.  I then closed this with 3-0 Vicryl and 4-0 Monocryl.  Glue was placed.  Final fluoroscopic image showed the port to be in good position.  I then accessed the port and was able to aspirate blood and packed this with heparin.  I then did the lumpectomy on the left side.  The seed was in the central breast.  I infiltrated some Marcaine in the periareolar position and made a incision at the superior areolar border in order to hide the scar later.  I then dissected through the breast tissue and used the neoprobe to identify the seed.  I then remove the seed and the surrounding tissue in that attempt to get a clear margin.  Mammogram confirmed removal of  the radioactive seed as well as the clip.  I did take some additional margins as listed above.  The posterior margin is now the muscle.  I placed clips in the cavity.  I then closed the breast tissue with 2-0 Vicryl.  The skin was closed with 3-0 Vicryl 5-0 Monocryl.  Glue and Steri-Strips were applied.  I then identified the location of the sentinel node in the low axilla.  I infiltrated Marcaine and dissected to the axillary fascia.  I remove what appeared to be several normal sized axillary  lymph nodes.  The highest count is listed as above.  There was minimal background radioactivity.  I then obtained hemostasis.  Closed the axillary fascia with 2-0 Vicryl.  The skin was closed with 3-0 Vicryl for Monocryl.  Glue and Steri-Strips were applied.  A binder was placed.  She tolerated this well was extubated and transferred to recovery stable.

## 2020-07-13 NOTE — Transfer of Care (Signed)
Immediate Anesthesia Transfer of Care Note  Patient: April [name]  Procedure(s) Performed: LEFT BREAST LUMPECTOMY WITH RADIOACTIVE SEED AND LEFT AXILLARY SENTINEL LYMPH NODE BIOPSY (Left Breast) INSERTION PORT-A-CATH WITH ULTRASOUND GUIDANCE (Right Chest)  Patient Location: PACU  Anesthesia Type:General and Regional  Level of Consciousness: drowsy  Airway & Oxygen Therapy: Patient Spontanous Breathing and Patient connected to face mask oxygen  Post-op Assessment: Report given to RN and Post -op Vital signs reviewed and stable  Post vital signs: Reviewed and stable  Last Vitals:  Vitals Value Taken Time  BP 162/74 07/13/20 1200  Temp    Pulse 90 07/13/20 1202  Resp 14 07/13/20 1202  SpO2 100 % 07/13/20 1202  Vitals shown include unvalidated device data.  Last Pain:  Vitals:   07/13/20 0828  TempSrc: Oral  PainSc: 0-No pain         Complications: No complications documented.

## 2020-07-14 ENCOUNTER — Encounter (HOSPITAL_BASED_OUTPATIENT_CLINIC_OR_DEPARTMENT_OTHER): Payer: Self-pay | Admitting: General Surgery

## 2020-07-19 ENCOUNTER — Encounter: Payer: Self-pay | Admitting: *Deleted

## 2020-07-20 ENCOUNTER — Other Ambulatory Visit: Payer: Self-pay | Admitting: Oncology

## 2020-07-20 DIAGNOSIS — Z171 Estrogen receptor negative status [ER-]: Secondary | ICD-10-CM

## 2020-07-20 DIAGNOSIS — C50411 Malignant neoplasm of upper-outer quadrant of right female breast: Secondary | ICD-10-CM

## 2020-07-21 ENCOUNTER — Encounter: Payer: Self-pay | Admitting: *Deleted

## 2020-07-21 LAB — SURGICAL PATHOLOGY

## 2020-07-26 ENCOUNTER — Other Ambulatory Visit: Payer: Self-pay

## 2020-07-26 ENCOUNTER — Other Ambulatory Visit: Payer: Self-pay | Admitting: *Deleted

## 2020-07-26 ENCOUNTER — Inpatient Hospital Stay: Payer: Medicare PPO | Attending: Oncology | Admitting: Oncology

## 2020-07-26 VITALS — BP 147/76 | HR 92 | Temp 97.5°F | Resp 18 | Ht 63.5 in | Wt 150.6 lb

## 2020-07-26 DIAGNOSIS — Z171 Estrogen receptor negative status [ER-]: Secondary | ICD-10-CM | POA: Diagnosis not present

## 2020-07-26 DIAGNOSIS — Z5112 Encounter for antineoplastic immunotherapy: Secondary | ICD-10-CM | POA: Insufficient documentation

## 2020-07-26 DIAGNOSIS — C50411 Malignant neoplasm of upper-outer quadrant of right female breast: Secondary | ICD-10-CM | POA: Diagnosis not present

## 2020-07-26 DIAGNOSIS — Z8041 Family history of malignant neoplasm of ovary: Secondary | ICD-10-CM | POA: Diagnosis not present

## 2020-07-26 DIAGNOSIS — C50212 Malignant neoplasm of upper-inner quadrant of left female breast: Secondary | ICD-10-CM | POA: Insufficient documentation

## 2020-07-26 DIAGNOSIS — Z79899 Other long term (current) drug therapy: Secondary | ICD-10-CM | POA: Insufficient documentation

## 2020-07-26 DIAGNOSIS — G620 Drug-induced polyneuropathy: Secondary | ICD-10-CM | POA: Diagnosis not present

## 2020-07-26 DIAGNOSIS — Z8042 Family history of malignant neoplasm of prostate: Secondary | ICD-10-CM | POA: Insufficient documentation

## 2020-07-26 DIAGNOSIS — Z95828 Presence of other vascular implants and grafts: Secondary | ICD-10-CM | POA: Diagnosis not present

## 2020-07-26 DIAGNOSIS — G459 Transient cerebral ischemic attack, unspecified: Secondary | ICD-10-CM | POA: Diagnosis not present

## 2020-07-26 DIAGNOSIS — Z8249 Family history of ischemic heart disease and other diseases of the circulatory system: Secondary | ICD-10-CM | POA: Insufficient documentation

## 2020-07-26 DIAGNOSIS — T451X5A Adverse effect of antineoplastic and immunosuppressive drugs, initial encounter: Secondary | ICD-10-CM | POA: Insufficient documentation

## 2020-07-26 DIAGNOSIS — I10 Essential (primary) hypertension: Secondary | ICD-10-CM

## 2020-07-26 DIAGNOSIS — Z5111 Encounter for antineoplastic chemotherapy: Secondary | ICD-10-CM | POA: Insufficient documentation

## 2020-07-26 DIAGNOSIS — Z803 Family history of malignant neoplasm of breast: Secondary | ICD-10-CM | POA: Diagnosis not present

## 2020-07-26 DIAGNOSIS — N281 Cyst of kidney, acquired: Secondary | ICD-10-CM | POA: Diagnosis not present

## 2020-07-26 MED ORDER — PROCHLORPERAZINE MALEATE 10 MG PO TABS: 10.0000 mg | ORAL_TABLET | Freq: Four times a day (QID) | ORAL | 1 refills | Status: DC | PRN

## 2020-07-26 MED ORDER — LIDOCAINE-PRILOCAINE 2.5-2.5 % EX CREA
1.0000 | TOPICAL_CREAM | CUTANEOUS | 0 refills | Status: DC | PRN
Start: 2020-07-26 — End: 2021-03-22

## 2020-07-26 MED ORDER — LIDOCAINE-PRILOCAINE 2.5-2.5 % EX CREA: TOPICAL_CREAM | CUTANEOUS | 3 refills | Status: DC

## 2020-07-26 NOTE — Progress Notes (Signed)
[place]  Telephone:([phone]) [phone] Fax:(336) [phone]     ID: ASRA GAMBREL DOB: [birth year]  MR#: [id number]  ZYS#:[id number]  Patient Care Team: Kelton Pillar, MD as PCP - General (Family Medicine) Carlis Blanchard, Virgie Dad, MD as Consulting Physician (Oncology) Rolm Bookbinder, MD as Consulting Physician (General Surgery) Eppie Gibson, MD as Attending Physician (Radiation Oncology) Newt Minion, MD as Consulting Physician (Orthopedic Surgery) Regal, Tamala Fothergill, DPM as Consulting Physician (Podiatry) Neldon Mc, Donnamarie Poag, MD as Consulting Physician (Allergy and Immunology) Mauro Kaufmann, RN as Oncology Nurse Navigator Rockwell Germany, RN as Oncology Nurse Navigator OTHER MD:  CHIEF COMPLAINT: Estrogen receptor negative breast cancer  CURRENT TREATMENT: Adjuvant chemo immunotherapy   INTERVAL HISTORY: Amberlie returns today for follow up of her new left breast cancer, as well as her right more remote breast cancer. She was evaluated for her left breast cancer in the breast cancer clinic on 06/29/2020.  Since consultation, she underwent staging chest CT and bone scan. Chest CT showed: no evidence for metastatic disease in chest; likely-benign small hypodensities in lateral left liver; 4.8 cm left renal cyst.  Bone scan was negative for skeletal metastases.  She proceeded with left lumpectomy on 07/13/2020 under Dr. Donne Hazel. Pathology from the procedure [id number]) showed: invasive ductal carcinoma, grade 3, 1.3 cm; high-grade ductal carcinoma in situ with necrosis; lymphovascular invasion; margins negative for carcinoma. Prognostic panel was repeated on the final surgical sample showing: estrogen receptor 70% positive with weak staining intensity; progesterone receptor 0% negative; Her2 equivocal by immunohistochemistry (2+), but negative by FISH.  All 5 biopsied lymph nodes were negative for carcinoma (0/5).   REVIEW OF SYSTEMS: Eller did well with her  surgery but then developed a reaction either to the iodine soap or she thinks to the glue used to close the wound.  This was extremely uncomfortable and irritated and she tried various creams.  Finally coconut oil was helpful and that is now better controlled.  Otherwise she had no significant pain, fever, bleeding, or other complications.  She is pleased with the cosmetic result   COVID 19 VACCINATION STATUS:    LEFT BREAST CANCER HISTORY: From the original intake note:  She underwent bilateral diagnostic mammography with tomography at [facility] on 06/15/2020 showing: breast density category C; this showed in the right breast evidence of postsurgical and radiation changes but this was all stable.  On the left however there was a new group of pleomorphic calcifications in the upper inner quadrant..  She proceeded to biopsy of the left breast area in question on 06/15/2020. Pathology from the procedure ([id number]) showed: invasive ductal carcinoma, grade 3; high-grade ductal carcinoma in situ with necrosis; lymphovascular invasion identified. Prognostic indicators significant for: estrogen receptor 40% positive with weak staining intensity; progesterone receptor 0% negative. Proliferation marker Ki67 of 45%. Her2 positive by immunohistochemistry (3+).   RIGHT BREAST CANCER HISTORY: From the original intake note:  "Octa" had bilateral screening mammography at [facility] 06/06/2017.  This showed a possible mass in the right breast at the 11 o'clock position.  On 06/13/2017 she underwent right diagnostic mammography and ultrasonography.  Breast density was category C.  In the right breast at the 11 o'clock position there was a 1 cm area by mammography.  By ultrasound this confirmed a 1.0 cm irregular mass with lobulated margins in the upper outer quadrant of the right breast.  There was a second, 0.4 cm lobulated mass in the same quadrant.  The right axilla was sonographically benign.  On 06/20/2017 biopsy  of the 2 right breast masses in question was performed.  The final pathology ([id number]) found the smaller mass to be only fibrocystic change.  This is felt to be concordant.  The larger mass however was an invasive ductal carcinoma, grade 3, estrogen and progesterone receptor negative, with an MIB-1 of 30%, and HER-2 amplified, with a signals ratio of 2.24.  The number per cell was 4.60.  The patient's subsequent history is as detailed below.   PAST MEDICAL HISTORY: Past Medical History:  Diagnosis Date  . Breast cancer ([place]) 05/2017   right breast  . Eczema   . Family history of breast cancer   . Family history of ovarian cancer   . History of radiation therapy 10/17/17- 11/14/17   40.05 directed to the right breast in 15 fractions, followed by a boost of 10 gy given in 5 fractions.   . Hypertension    toxemia during pregnancy, no meds now    PAST SURGICAL HISTORY: Past Surgical History:  Procedure Laterality Date  . ABDOMINAL HYSTERECTOMY    . BREAST LUMPECTOMY WITH RADIOACTIVE SEED AND SENTINEL LYMPH NODE BIOPSY Right 07/17/2017   Procedure: BREAST LUMPECTOMY WITH RADIOACTIVE SEED AND SENTINEL LYMPH NODE BIOPSY;  Surgeon: Rolm Bookbinder, MD;  Location: [place];  Service: General;  Laterality: Right;  . BREAST LUMPECTOMY WITH RADIOACTIVE SEED AND SENTINEL LYMPH NODE BIOPSY Left 07/13/2020   Procedure: LEFT BREAST LUMPECTOMY WITH RADIOACTIVE SEED AND LEFT AXILLARY SENTINEL LYMPH NODE BIOPSY;  Surgeon: Rolm Bookbinder, MD;  Location: [place];  Service: General;  Laterality: Left;  . CESAREAN SECTION     x4  . DILATION AND CURETTAGE OF UTERUS    . KNEE SURGERY Right   . PORT-A-CATH REMOVAL Right 09/04/2018   Procedure: REMOVAL PORT-A-CATH;  Surgeon: Rolm Bookbinder, MD;  Location: [place];  Service: General;  Laterality: Right;  . PORTACATH PLACEMENT N/A 07/17/2017   Procedure: INSERTION PORT-A-CATH WITH [place];  Surgeon:  Rolm Bookbinder, MD;  Location: [place];  Service: General;  Laterality: N/A;  . PORTACATH PLACEMENT Right 07/13/2020   Procedure: INSERTION PORT-A-CATH WITH ULTRASOUND GUIDANCE;  Surgeon: Rolm Bookbinder, MD;  Location: [place];  Service: General;  Laterality: Right;    FAMILY HISTORY Family History  Problem Relation Age of Onset  . Breast cancer Mother 16       again at 52 in other breast   . Heart attack Father 17  . Breast cancer Sister 33  . Breast cancer Maternal Grandmother 16       spread to lungs, died at 54  . Ovarian cancer Cousin 63  . Prostate cancer Cousin   The patient's father died at age 71 from a heart attack.  The patient's mother is currently living at age 25 (as of January 2019).  The patient had 1 sister who was diagnosed with breast cancer at age 37 and died from metastatic disease at age 69.  The patient has 1 brother.  In addition the patient's mother was diagnosed with breast cancer at age 74, on the left side, and now has a right-sided breast cancer diagnosed in January 2019.  There is in addition a cousin with ovarian cancer diagnosed when she was 71 years old   GYNECOLOGIC HISTORY:  No LMP recorded. Patient has had a hysterectomy. Menarche age 20, first live birth age 16, the patient had 3 live births, 1 of whom survived only 2  days.  She underwent hysterectomy without salpingo-oophorectomy September 05, 1978.  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  Her husband [name] his Theme park manager at [place].  The patient's daughter [name] lives in [place] where she is an Tourist information centre manager.  The patient's daughter [name] lives in [place] working for the department of defense.  The patient has 3 grandchildren, 1 of whom is in the seventh grade but is  actually the captain of the eighth grade basketball team and is playing in a championship February 2020.   ADVANCED DIRECTIVES: Not in place   HEALTH MAINTENANCE: Social History   Tobacco Use  . Smoking status: Never Smoker  . Smokeless tobacco: Never Used  Vaping Use  . Vaping Use: Never used  Substance Use Topics  . Alcohol use: Yes    Comment: social  . Drug use: No     Colonoscopy: April 2018/[facility]  PAP: Status post hysterectomy  Bone density:   Allergies  Allergen Reactions  . Tape Itching and Rash  . Nsaids     Stomach issue  . Sulfa Antibiotics Rash    Current Outpatient Medications  Medication Sig Dispense Refill  . CALCIUM-VITAMIN D PO Take 1 tablet by mouth daily.    Marland Kitchen co-enzyme Q-10 30 MG capsule Take 30 mg by mouth daily.    . hydrochlorothiazide (MICROZIDE) 12.5 MG capsule TAKE 1 CAPSULE BY MOUTH EVERY DAY 90 capsule 3  . lidocaine-prilocaine (EMLA) cream Apply 1 application topically as needed. 30 g 0  . Omega-3 Fatty Acids (FISH OIL) 1000 MG CAPS Take 1 capsule by mouth daily.    . Red Yeast Rice Extract (RED YEAST RICE PO) Take by mouth.    . traMADol (ULTRAM) 50 MG tablet Take 1 tablet (50 mg total) by mouth every 6 (six) hours as needed. 10 tablet 0  . vitamin B-12 (CYANOCOBALAMIN) 1000 MCG tablet Take 1,000 mcg by mouth daily.    . vitamin C (ASCORBIC ACID) 250 MG tablet Take 250 mg by mouth daily.     No current facility-administered medications for this visit.    OBJECTIVE: African-American woman who appears stated age 54:   07/26/20 1158  BP: (!) 147/76  Pulse: 92  Resp: 18  Temp: (!) 97.5 F (36.4 C)  SpO2: 100%     Body mass index is 26.26 kg/m.    Wt Readings from Last 3 Encounters:  07/26/20 150 lb 9.6 oz (68.3 kg)  07/13/20 154 lb 5.2 oz (70 kg)  06/29/20 150 lb 14.4 oz (68.4 kg)  ECOG FS:0 - Asymptomatic   Sclerae unicteric, EOMs intact Wearing a mask No cervical or supraclavicular adenopathy Lungs no rales or  rhonchi Heart regular rate and rhythm Abd soft, nontender, positive bowel sounds MSK no focal spinal tenderness, no upper extremity lymphedema Neuro: nonfocal, well oriented, appropriate affect Breasts: The right breast is status post lumpectomy and radiation with no evidence of local recurrence.  The left breast is status post recent lumpectomy.  The incisions are healing nicely, with no dehiscence or swelling.  The cosmetic result is good.  Both axillae are benign.   LAB RESULTS:  CMP     Component Value Date/Time   NA 140 06/29/2020 1318   K 4.0 06/29/2020 1318   CL 103 06/29/2020 1318   CO2 29 06/29/2020 1318  GLUCOSE 107 (H) 06/29/2020 1318   BUN 11 06/29/2020 1318   CREATININE 1.18 (H) 06/29/2020 1318   CREATININE 1.01 06/27/2017 0843   CALCIUM 10.5 (H) 06/29/2020 1318   PROT 8.1 06/29/2020 1318   ALBUMIN 4.1 06/29/2020 1318   AST 20 06/29/2020 1318   AST 16 06/27/2017 0843   ALT 18 06/29/2020 1318   ALT 14 06/27/2017 0843   ALKPHOS 69 06/29/2020 1318   BILITOT 0.4 06/29/2020 1318   BILITOT 0.2 06/27/2017 0843   GFRNONAA 50 (L) 06/29/2020 1318   GFRNONAA 56 (L) 06/27/2017 0843   GFRAA >60 07/10/2019 1008   GFRAA >60 06/27/2017 0843    No results found for: TOTALPROTELP, ALBUMINELP, A1GS, A2GS, BETS, BETA2SER, GAMS, MSPIKE, SPEI  No results found for: KPAFRELGTCHN, LAMBDASER, KAPLAMBRATIO  Lab Results  Component Value Date   WBC 4.9 06/29/2020   NEUTROABS 2.4 06/29/2020   HGB 12.4 06/29/2020   HCT 37.4 06/29/2020   MCV 88.6 06/29/2020   PLT 288 06/29/2020   No results found for: LABCA2  No components found for: TDHRCB638  No results for input(s): INR in the last 168 hours.  No results found for: LABCA2  No results found for: GTX646  No results found for: OEH212  No results found for: YQM250  No results found for: CA2729  No components found for: HGQUANT  No results found for: CEA1 / No results found for: CEA1   No results found for:  AFPTUMOR  No results found for: CHROMOGRNA  No results found for: HGBA, HGBA2QUANT, HGBFQUANT, HGBSQUAN (Hemoglobinopathy evaluation)   No results found for: LDH  Lab Results  Component Value Date   IRON 77 05/21/2018   TIBC 404 05/21/2018   IRONPCTSAT 19 (L) 05/21/2018   (Iron and TIBC)  Lab Results  Component Value Date   FERRITIN 24 05/21/2018    Urinalysis    Component Value Date/Time   COLORURINE COLORLESS (A) 08/09/2017 2224   APPEARANCEUR CLEAR 08/09/2017 2224   LABSPEC 1.004 (L) 08/09/2017 2224   PHURINE 6.0 08/09/2017 2224   GLUCOSEU NEGATIVE 08/09/2017 2224   HGBUR NEGATIVE 08/09/2017 2224   BILIRUBINUR NEGATIVE 08/09/2017 2224   KETONESUR NEGATIVE 08/09/2017 2224   PROTEINUR NEGATIVE 08/09/2017 2224   NITRITE NEGATIVE 08/09/2017 2224   LEUKOCYTESUR NEGATIVE 08/09/2017 2224    STUDIES: DG Chest 1 View  Result Date: 07/13/2020 CLINICAL DATA:  Port-A-Cath placement. EXAM: DG C-ARM 1-60 MIN; CHEST  1 VIEW COMPARISON:  Chest CT 07/08/2020 FLUOROSCOPY TIME:  Fluoroscopy Time:  20 seconds Radiation Exposure Index (if provided by the fluoroscopic device): 1.60 mGy FINDINGS: A single intraoperative spot fluoroscopic image of the upper chest is provided. A right jugular Port-A-Cath has been placed. The tip of the catheter is suboptimally visualized due to fluoroscopic technique and superimposition of the spine but likely terminates over the upper SVC. IMPRESSION: Intraoperative image during Port-A-Cath placement as above. Electronically Signed   By: Logan Bores M.D.   On: 07/13/2020 16:17   CT Chest W Contrast  Result Date: 07/08/2020 CLINICAL DATA:  Breast cancer.  Restaging. EXAM: CT CHEST WITH CONTRAST TECHNIQUE: Multidetector CT imaging of the chest was performed during intravenous contrast administration. CONTRAST:  52m OMNIPAQUE IOHEXOL 300 MG/ML  SOLN COMPARISON:  None. FINDINGS: Cardiovascular: The heart size is normal. No substantial pericardial effusion.  Mediastinum/Nodes: No mediastinal lymphadenopathy. There is no hilar lymphadenopathy. The esophagus has normal imaging features. There is no axillary lymphadenopathy. Lungs/Pleura: No suspicious pulmonary nodule or mass. No focal airspace consolidation.  No pleural effusion. Upper Abdomen: Small hypodensities in the lateral segment left liver cannot be definitively characterized but are likely benign and probably cysts. Small area of low attenuation in the anterior liver, adjacent to the falciform ligament, is in a characteristic location for focal fatty deposition. 4.8 cm cyst noted interpolar left kidney. Musculoskeletal: No worrisome lytic or sclerotic osseous abnormality. No worrisome lytic or sclerotic osseous abnormality. IMPRESSION: 1. No evidence for metastatic disease in the chest. 2. Small hypodensities in the lateral segment left liver cannot be definitively characterized but are likely benign. 3. 4.8 cm left renal cyst. Electronically Signed   By: Misty Stanley M.D.   On: 07/08/2020 14:03   NM Bone Scan Whole Body  Result Date: 07/09/2020 CLINICAL DATA:  Left breast cancer, scheduled for lumpectomy next Tuesday. History of right breast cancer in 2019, status post lumpectomy, chemotherapy, radiation, and immunotherapy. EXAM: NUCLEAR MEDICINE WHOLE BODY BONE SCAN TECHNIQUE: Whole body anterior and posterior images were obtained approximately 3 hours after intravenous injection of radiopharmaceutical. RADIOPHARMACEUTICALS:  21.1 mCi Technetium-60mMDP IV COMPARISON:  CT chest dated 07/08/2020 FINDINGS: No abnormal accumulation of radiotracer within the axillary or appendicular skeleton to suggest skeletal metastases. Degenerative uptake in the right elbow and left wrist. Excretory radiotracer in the bladder. IMPRESSION: No scintigraphic evidence of skeletal metastases. Electronically Signed   By: SJulian HyM.D.   On: 07/09/2020 13:34   NM Sentinel Node Inj-No Rpt (Breast)  Result Date:  07/13/2020 Sulfur colloid was injected by the nuclear medicine technologist for melanoma sentinel node.   MM Breast Surgical Specimen  Result Date: 07/13/2020 CLINICAL DATA:  Status post lumpectomy today after earlier radioactive seed localization. EXAM: SPECIMEN RADIOGRAPH OF THE LEFT BREAST COMPARISON:  Previous exam(s). FINDINGS: Status post excision of the left breast. The radioactive seed and biopsy marker clip are present and completely intact. Findings discussed with the OR staff during the procedure. IMPRESSION: Specimen radiograph of the left breast. Electronically Signed   By: SFranki CabotM.D.   On: 07/13/2020 11:15   DG C-Arm 1-60 Min  Result Date: 07/13/2020 CLINICAL DATA:  Port-A-Cath placement. EXAM: DG C-ARM 1-60 MIN; CHEST  1 VIEW COMPARISON:  Chest CT 07/08/2020 FLUOROSCOPY TIME:  Fluoroscopy Time:  20 seconds Radiation Exposure Index (if provided by the fluoroscopic device): 1.60 mGy FINDINGS: A single intraoperative spot fluoroscopic image of the upper chest is provided. A right jugular Port-A-Cath has been placed. The tip of the catheter is suboptimally visualized due to fluoroscopic technique and superimposition of the spine but likely terminates over the upper SVC. IMPRESSION: Intraoperative image during Port-A-Cath placement as above. Electronically Signed   By: ALogan BoresM.D.   On: 07/13/2020 16:17   MM LT RADIOACTIVE SEED LOC MAMMO GUIDE  Result Date: 07/12/2020 CLINICAL DATA:  Patient presents for seed localization prior to LEFT lumpectomy for recently diagnosed grade 3 invasive ductal carcinoma. EXAM: MAMMOGRAPHIC GUIDED RADIOACTIVE SEED LOCALIZATION OF THE LEFT BREAST COMPARISON:  Previous exam(s). FINDINGS: Patient presents for radioactive seed localization prior to lumpectomy. I met with the patient and we discussed the procedure of seed localization including benefits and alternatives. We discussed the high likelihood of a successful procedure. We discussed the risks  of the procedure including infection, bleeding, tissue injury and further surgery. We discussed the low dose of radioactivity involved in the procedure. Informed, written consent was given. The usual time-out protocol was performed immediately prior to the procedure. Using mammographic guidance, sterile technique, 1% lidocaine and an I-125 radioactive  seed, the barbell shaped clip in the LATERAL portion of the LEFT breast was localized using a LATERAL to MEDIAL approach. The follow-up mammogram images confirm the seed in the expected location and were marked for Dr. Donne Hazel. Follow-up survey of the patient confirms presence of the radioactive seed. Order number of I-125 seed:  [id number]. Total activity:  7.564 millicuries reference Date: 03/23/2020 The patient tolerated the procedure well and was released from the [place]. She was given instructions regarding seed removal. IMPRESSION: Radioactive seed localization left breast. No apparent complications. Electronically Signed   By: Nolon Nations M.D.   On: 07/12/2020 14:04     ELIGIBLE FOR AVAILABLE RESEARCH PROTOCOL: no   ASSESSMENT: 71 y.o. [place] woman   RIGHT BREAST CANCER (0) status post right breast upper outer quadrant biopsy 06/20/2017 for a clinical T1b N0, stage IA invasive ductal carcinoma, grade 3, estrogen and progesterone receptor negative, but HER-2 amplified, with an MIB-1 of 30%  (1) genetics testing 07/05/2017 through the Common Hereditary Cancer Panel offered by [facility] found no deleterious mutations in APC, ATM, AXIN2, BARD1, BMPR1A, BRCA1, BRCA2, BRIP1, CDH1, CDKN2A (p14ARF), CDKN2A (p16INK4a), CKD4, CHEK2, CTNNA1, DICER1, EPCAM (Deletion/duplication testing only), GREM1 (promoter region deletion/duplication testing only), KIT, MEN1, MLH1, MSH2, MSH3, MSH6, MUTYH, NBN, NF1, NHTL1, PALB2, PDGFRA, PMS2, POLD1, POLE, PTEN, RAD50, RAD51C, RAD51D, SDHB, SDHC, SDHD, SMAD4, SMARCA4. STK11, TP53, TSC1, TSC2, and  VHL.  The following genes were evaluated for sequence changes only: SDHA and HOXB13 c.251G>A variant only.  (a) a Variant of uncertain significance in MSH2 was identified c.1331G>T (p.Arg444Leu).   (2) status post right lumpectomy and sentinel lymph node sampling 07/17/2017 for a pT1c pN0, stage IA invasive ductal carcinoma, grade 2, with negative margins.  A total of 5 lymph nodes were removed  (3) adjuvant chemotherapy consisting of paclitaxel weekly x12 together with trastuzumab every 21 days starting 08/07/2017  (a) paclitaxel stopped after 8 doses because of neuropathy (last dose 09/25/2017  (4) trastuzumab continued to total 1 year (last dose 07/23/2018)  (a) echo 08/10/2017 showed an ejection fraction in the 65-70% range  (b) echo on 11/01/2017 shows EF of 60-65%  (c) cardiogram 02/05/2018 shows an ejection fraction in the 60-65% range.  (d) echocardiogram 05/28/2018 shows an ejection fraction in the 65-70%.  (5) adjuvant radiation 10/17/2017-11/14/2016: 40.05 Gy directed to the Right Breast in 15 fractions, followed by a boost of 10 Gy given in 5 fractions   (6) anemia, with normal MCV, ferritin, B12, folate, and inappropriately normal reticulocyte count, consistent with anemia of chronic illness.  LEFT BREAST CANCER: (7) status post left breast upper inner quadrant biopsy 06/15/2020 for a clinical T1 N0 invasive ductal carcinoma, grade 3, weakly estrogen receptor positive, progesterone receptor negative, HER2 amplified, with an MIB-1 of 45%.  (8) status post left lumpectomy and sentinel lymph node sampling 07/13/2020 for a pT1c pN0, stage IB invasive ductal carcinoma, grade 3, with negative margins  (a) a total of 5 axilla lymph nodes were removed, all benign  (b) repeat prognostic panel again weakly estrogen receptor positive, progesterone receptor negative, now HER-2 not amplified  (9) adjuvant chemotherapy and anti-HER2 immunotherapy to start 07/27/2020, consisting of CMF  chemotherapy to be repeated every 21 days and trastuzumab to be continued for 1 year.  (a) echocardiogram 05/28/2020 shows an ejection fraction in the 65-70% range  (10) trastuzumab to be continued a minimum of 1 year  (11) adjuvant radiation can be given concurrently with CMF  (12) to start  letrozole at the completion of local treatment   PLAN: Chelcy did well with her surgery, and has gotten over the skin reactions that were making her uncomfortable.  She is now ready to start adjuvant treatment.  For the anti-HER-2 therapy she will receive trastuzumab and that will go for 1 year.  She has an echo from December which will serve as our baseline.  The chemotherapy decision was more complex.  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  She is going to be receiving anti-HER-2 agents that may affect cardiac function and so we would like to stay away from anthracyclines.  I think a good choice for her may be CMF which avoids both of those concerns.  We discussed the possible toxicities side effects and complications of the relevant agents and we hope to be able to start tomorrow if possible.  I will let her radiation oncologist know that we are starting CMF so they may plan for concurrent adjuvant radiation if that seems appropriate  Encounter time 40 minutes.*     Jermayne Sweeney, Virgie Dad, MD  07/26/20 6:50 PM Medical Oncology and Hematology [facility] [place], [place] [address] Tel. [phone]    Fax. [phone]   I, Wilburn Mylar, am acting as scribe for Dr. Virgie Dad. Jerrid Forgette.  I, Lurline Del MD, have reviewed the above documentation for accuracy and completeness, and I agree with the above.   *Total Encounter Time as defined by the Centers for Medicare and Medicaid Services includes, in addition to the face-to-face time of a patient visit (documented in the note above) non-face-to-face time: obtaining and  reviewing outside history, ordering and reviewing medications, tests or procedures, care coordination (communications with other health care professionals or caregivers) and documentation in the medical record.

## 2020-07-26 NOTE — Progress Notes (Signed)
DISCONTINUE OFF PATHWAY REGIMEN - Breast   OFF00020:Paclitaxel + Trastuzumab:   A cycle is every 28 days:     Paclitaxel      Trastuzumab      Trastuzumab   **Always confirm dose/schedule in your pharmacy ordering system**  REASON: Other Reason PRIOR TREATMENT: Off Pathway: Paclitaxel + Trastuzumab TREATMENT RESPONSE: Unable to Evaluate  START OFF PATHWAY REGIMEN - Breast   [id number]:CMF (IV cyclophosphamide) q21 days:   A cycle is every 21 days:     Cyclophosphamide      Methotrexate      Fluorouracil   **Always confirm dose/schedule in your pharmacy ordering system**  Patient Characteristics: Postoperative without Neoadjuvant Therapy (Pathologic Staging), Invasive Disease, Adjuvant Therapy, HER2 Positive, ER Negative/Unknown, Node Negative, pT1c, pN0/N54m Therapeutic Status: Postoperative without Neoadjuvant Therapy (Pathologic Staging) AJCC Grade: G3 AJCC N Category: pN0 AJCC M Category: cM0 ER Status: Negative (-) AJCC 8 Stage Grouping: IA HER2 Status: Positive (+) Oncotype Dx Recurrence Score: Not Appropriate AJCC T Category: pT1c PR Status: Negative (-) Adjuvant Therapy Status: No Adjuvant Therapy Received Yet or Changing Initial Adjuvant Regimen due to Tolerance Intent of Therapy: Curative Intent, Discussed with Patient

## 2020-07-27 ENCOUNTER — Inpatient Hospital Stay: Payer: Medicare PPO

## 2020-07-27 ENCOUNTER — Other Ambulatory Visit: Payer: Self-pay | Admitting: *Deleted

## 2020-07-27 ENCOUNTER — Ambulatory Visit: Payer: Medicare PPO

## 2020-07-27 ENCOUNTER — Encounter: Payer: Self-pay | Admitting: *Deleted

## 2020-07-27 ENCOUNTER — Other Ambulatory Visit: Payer: Self-pay

## 2020-07-27 ENCOUNTER — Other Ambulatory Visit: Payer: Self-pay | Admitting: Oncology

## 2020-07-27 ENCOUNTER — Telehealth: Payer: Self-pay | Admitting: *Deleted

## 2020-07-27 ENCOUNTER — Telehealth: Payer: Self-pay | Admitting: Oncology

## 2020-07-27 VITALS — BP 185/57 | HR 61 | Temp 98.0°F | Resp 18

## 2020-07-27 DIAGNOSIS — Z95828 Presence of other vascular implants and grafts: Secondary | ICD-10-CM

## 2020-07-27 DIAGNOSIS — C50212 Malignant neoplasm of upper-inner quadrant of left female breast: Secondary | ICD-10-CM | POA: Diagnosis not present

## 2020-07-27 DIAGNOSIS — G620 Drug-induced polyneuropathy: Secondary | ICD-10-CM | POA: Diagnosis not present

## 2020-07-27 DIAGNOSIS — Z171 Estrogen receptor negative status [ER-]: Secondary | ICD-10-CM

## 2020-07-27 DIAGNOSIS — Z5111 Encounter for antineoplastic chemotherapy: Secondary | ICD-10-CM | POA: Diagnosis not present

## 2020-07-27 DIAGNOSIS — T451X5A Adverse effect of antineoplastic and immunosuppressive drugs, initial encounter: Secondary | ICD-10-CM | POA: Diagnosis not present

## 2020-07-27 DIAGNOSIS — N281 Cyst of kidney, acquired: Secondary | ICD-10-CM | POA: Diagnosis not present

## 2020-07-27 DIAGNOSIS — Z5112 Encounter for antineoplastic immunotherapy: Secondary | ICD-10-CM | POA: Diagnosis not present

## 2020-07-27 DIAGNOSIS — C50411 Malignant neoplasm of upper-outer quadrant of right female breast: Secondary | ICD-10-CM | POA: Diagnosis not present

## 2020-07-27 DIAGNOSIS — Z79899 Other long term (current) drug therapy: Secondary | ICD-10-CM | POA: Diagnosis not present

## 2020-07-27 LAB — CBC WITH DIFFERENTIAL (CANCER CENTER ONLY)
Abs Immature Granulocytes: 0 10*3/uL (ref 0.00–0.07)
Basophils Absolute: 0.1 10*3/uL (ref 0.0–0.1)
Basophils Relative: 1 %
Eosinophils Absolute: 0.2 10*3/uL (ref 0.0–0.5)
Eosinophils Relative: 4 %
HCT: 34.4 % — ABNORMAL LOW (ref 36.0–46.0)
Hemoglobin: 11.2 g/dL — ABNORMAL LOW (ref 12.0–15.0)
Immature Granulocytes: 0 %
Lymphocytes Relative: 44 %
Lymphs Abs: 2.5 10*3/uL (ref 0.7–4.0)
MCH: 28.9 pg (ref 26.0–34.0)
MCHC: 32.6 g/dL (ref 30.0–36.0)
MCV: 88.7 fL (ref 80.0–100.0)
Monocytes Absolute: 0.5 10*3/uL (ref 0.1–1.0)
Monocytes Relative: 9 %
Neutro Abs: 2.3 10*3/uL (ref 1.7–7.7)
Neutrophils Relative %: 42 %
Platelet Count: 288 10*3/uL (ref 150–400)
RBC: 3.88 MIL/uL (ref 3.87–5.11)
RDW: 13 % (ref 11.5–15.5)
WBC Count: 5.5 10*3/uL (ref 4.0–10.5)
nRBC: 0 % (ref 0.0–0.2)

## 2020-07-27 LAB — CMP (CANCER CENTER ONLY)
ALT: 14 U/L (ref 0–44)
AST: 19 U/L (ref 15–41)
Albumin: 4 g/dL (ref 3.5–5.0)
Alkaline Phosphatase: 64 U/L (ref 38–126)
Anion gap: 7 (ref 5–15)
BUN: 13 mg/dL (ref 8–23)
CO2: 27 mmol/L (ref 22–32)
Calcium: 9.7 mg/dL (ref 8.9–10.3)
Chloride: 104 mmol/L (ref 98–111)
Creatinine: 0.99 mg/dL (ref 0.44–1.00)
GFR, Estimated: >60 mL/min (ref >60)
Glucose, Bld: 105 mg/dL — ABNORMAL HIGH (ref 70–99)
Potassium: 3.8 mmol/L (ref 3.5–5.1)
Sodium: 138 mmol/L (ref 135–145)
Total Bilirubin: 0.3 mg/dL (ref 0.3–1.2)
Total Protein: 7.7 g/dL (ref 6.5–8.1)

## 2020-07-27 MED ORDER — TRASTUZUMAB-ANNS CHEMO 150 MG IV SOLR: 2.0000 mg/kg | Freq: Once | INTRAVENOUS | Status: DC

## 2020-07-27 MED ORDER — ACETAMINOPHEN 325 MG PO TABS
ORAL_TABLET | ORAL | Status: AC
  Filled 2020-07-27: qty 2

## 2020-07-27 MED ORDER — METHOTREXATE SODIUM (PF) CHEMO INJECTION 250 MG/10ML
40.0000 mg/m2 | Freq: Once | INTRAMUSCULAR | Status: AC
  Administered 2020-07-27: 70 mg via INTRAVENOUS
  Filled 2020-07-27: qty 2.8

## 2020-07-27 MED ORDER — HEPARIN SOD (PORK) LOCK FLUSH 100 UNIT/ML IV SOLN
500.0000 [IU] | Freq: Once | INTRAVENOUS | Status: AC | PRN
  Administered 2020-07-27: 500 [IU]
  Filled 2020-07-27: qty 5

## 2020-07-27 MED ORDER — ACETAMINOPHEN 325 MG PO TABS
650.0000 mg | ORAL_TABLET | Freq: Once | ORAL | Status: AC
  Administered 2020-07-27: 650 mg via ORAL

## 2020-07-27 MED ORDER — SODIUM CHLORIDE 0.9% FLUSH
10.0000 mL | INTRAVENOUS | Status: DC | PRN
  Administered 2020-07-27: 10 mL
  Filled 2020-07-27: qty 10

## 2020-07-27 MED ORDER — SODIUM CHLORIDE 0.9 % IV SOLN
10.0000 mg | Freq: Once | INTRAVENOUS | Status: AC
  Administered 2020-07-27: 10 mg via INTRAVENOUS
  Filled 2020-07-27: qty 10

## 2020-07-27 MED ORDER — DIPHENHYDRAMINE HCL 25 MG PO CAPS
25.0000 mg | ORAL_CAPSULE | Freq: Once | ORAL | Status: AC
  Administered 2020-07-27: 25 mg via ORAL

## 2020-07-27 MED ORDER — PALONOSETRON HCL INJECTION 0.25 MG/5ML
INTRAVENOUS | Status: AC
  Filled 2020-07-27: qty 5

## 2020-07-27 MED ORDER — PALONOSETRON HCL INJECTION 0.25 MG/5ML
0.2500 mg | Freq: Once | INTRAVENOUS | Status: AC
  Administered 2020-07-27: 0.25 mg via INTRAVENOUS

## 2020-07-27 MED ORDER — SODIUM CHLORIDE 0.9 % IV SOLN
600.0000 mg/m2 | Freq: Once | INTRAVENOUS | Status: AC
  Administered 2020-07-27: 1060 mg via INTRAVENOUS
  Filled 2020-07-27: qty 53

## 2020-07-27 MED ORDER — SODIUM CHLORIDE 0.9% FLUSH
10.0000 mL | Freq: Once | INTRAVENOUS | Status: AC
  Administered 2020-07-27: 10 mL
  Filled 2020-07-27: qty 10

## 2020-07-27 MED ORDER — TRASTUZUMAB-ANNS CHEMO 150 MG IV SOLR
8.0000 mg/kg | Freq: Once | INTRAVENOUS | Status: AC
  Administered 2020-07-27: 546 mg via INTRAVENOUS
  Filled 2020-07-27: qty 26

## 2020-07-27 MED ORDER — SODIUM CHLORIDE 0.9 % IV SOLN
Freq: Once | INTRAVENOUS | Status: AC
  Filled 2020-07-27: qty 250

## 2020-07-27 MED ORDER — HEPARIN SOD (PORK) LOCK FLUSH 100 UNIT/ML IV SOLN
500.0000 [IU] | Freq: Once | INTRAVENOUS | Status: DC
  Filled 2020-07-27: qty 5

## 2020-07-27 MED ORDER — FLUOROURACIL CHEMO INJECTION 2.5 GM/50ML
600.0000 mg/m2 | Freq: Once | INTRAVENOUS | Status: AC
  Administered 2020-07-27: 1050 mg via INTRAVENOUS
  Filled 2020-07-27: qty 21

## 2020-07-27 MED ORDER — DIPHENHYDRAMINE HCL 25 MG PO CAPS
ORAL_CAPSULE | ORAL | Status: AC
  Filled 2020-07-27: qty 1

## 2020-07-27 NOTE — Patient Instructions (Signed)
[facility] Discharge Instructions for Patients Receiving Chemotherapy  Today you received the following chemotherapy agents fluorourcil, cytoxan, methotrexate, herceptin.  To help prevent nausea and vomiting after your treatment, we encourage you to take your nausea medication as directed.   If you develop nausea and vomiting that is not controlled by your nausea medication, call the clinic.   BELOW ARE SYMPTOMS THAT SHOULD BE REPORTED IMMEDIATELY:  *FEVER GREATER THAN 100.5 F  *CHILLS WITH OR WITHOUT FEVER  NAUSEA AND VOMITING THAT IS NOT CONTROLLED WITH YOUR NAUSEA MEDICATION  *UNUSUAL SHORTNESS OF BREATH  *UNUSUAL BRUISING OR BLEEDING  TENDERNESS IN MOUTH AND THROAT WITH OR WITHOUT PRESENCE OF ULCERS  *URINARY PROBLEMS  *BOWEL PROBLEMS  UNUSUAL RASH Items with * indicate a potential emergency and should be followed up as soon as possible.  Feel free to call the clinic should you have any questions or concerns. The clinic phone number is [phone].  Please show the [place] at check-in to the Emergency Department and triage nurse.

## 2020-07-27 NOTE — Telephone Encounter (Signed)
Per MD - ok to proceed with trastuzumab therapy today with noted last echo 05/28/2018 with echo scheduled 08/13/2020.

## 2020-07-27 NOTE — Telephone Encounter (Signed)
No 2/7 los. No changes made to pt's schedule.

## 2020-07-28 ENCOUNTER — Encounter: Payer: Self-pay | Admitting: Adult Health

## 2020-08-02 ENCOUNTER — Ambulatory Visit: Payer: Medicare PPO | Attending: Family Medicine

## 2020-08-02 ENCOUNTER — Other Ambulatory Visit: Payer: Self-pay

## 2020-08-02 ENCOUNTER — Other Ambulatory Visit: Payer: Self-pay | Admitting: *Deleted

## 2020-08-02 DIAGNOSIS — C50412 Malignant neoplasm of upper-outer quadrant of left female breast: Secondary | ICD-10-CM | POA: Insufficient documentation

## 2020-08-02 DIAGNOSIS — R609 Edema, unspecified: Secondary | ICD-10-CM | POA: Diagnosis not present

## 2020-08-02 DIAGNOSIS — R293 Abnormal posture: Secondary | ICD-10-CM | POA: Insufficient documentation

## 2020-08-02 DIAGNOSIS — C50411 Malignant neoplasm of upper-outer quadrant of right female breast: Secondary | ICD-10-CM

## 2020-08-02 DIAGNOSIS — Z171 Estrogen receptor negative status [ER-]: Secondary | ICD-10-CM

## 2020-08-02 NOTE — Therapy (Signed)
[place], [place], [address] Phone: [phone]   Fax:  [phone]  Physical Therapy Treatment  Patient Details  Name: April [name] MRN: [id number] Date of Birth: Nov 22, 1950 Referring Provider (PT): Dr. Donne Hazel   Encounter Date: 08/02/2020   PT End of Session - 08/02/20 1801    Visit Number 2    Number of Visits 11    Date for PT Re-Evaluation 08/30/20    PT Start Time 1102    PT Stop Time 1155    PT Time Calculation (min) 53 min    Activity Tolerance Patient tolerated treatment well    Behavior During Therapy [facility] for tasks assessed/performed           Past Medical History:  Diagnosis Date  . Breast cancer ([place]) 05/2017   right breast  . Eczema   . Family history of breast cancer   . Family history of ovarian cancer   . History of radiation therapy 10/17/17- 11/14/17   40.05 directed to the right breast in 15 fractions, followed by a boost of 10 gy given in 5 fractions.   . Hypertension    toxemia during pregnancy, no meds now    Past Surgical History:  Procedure Laterality Date  . ABDOMINAL HYSTERECTOMY    . BREAST LUMPECTOMY WITH RADIOACTIVE SEED AND SENTINEL LYMPH NODE BIOPSY Right 07/17/2017   Procedure: BREAST LUMPECTOMY WITH RADIOACTIVE SEED AND SENTINEL LYMPH NODE BIOPSY;  Surgeon: Rolm Bookbinder, MD;  Location: [facility];  Service: General;  Laterality: Right;  . BREAST LUMPECTOMY WITH RADIOACTIVE SEED AND SENTINEL LYMPH NODE BIOPSY Left 07/13/2020   Procedure: LEFT BREAST LUMPECTOMY WITH RADIOACTIVE SEED AND LEFT AXILLARY SENTINEL LYMPH NODE BIOPSY;  Surgeon: Rolm Bookbinder, MD;  Location: [facility];  Service: General;  Laterality: Left;  . CESAREAN SECTION     x4  . DILATION AND CURETTAGE OF UTERUS    . KNEE SURGERY Right   . PORT-A-CATH REMOVAL Right 09/04/2018   Procedure: REMOVAL PORT-A-CATH;  Surgeon: Rolm Bookbinder, MD;   Location: [facility];  Service: General;  Laterality: Right;  . PORTACATH PLACEMENT N/A 07/17/2017   Procedure: INSERTION PORT-A-CATH WITH [place];  Surgeon: Rolm Bookbinder, MD;  Location: [facility];  Service: General;  Laterality: N/A;  . PORTACATH PLACEMENT Right 07/13/2020   Procedure: INSERTION PORT-A-CATH WITH ULTRASOUND GUIDANCE;  Surgeon: Rolm Bookbinder, MD;  Location: [facility];  Service: General;  Laterality: Right;    There were no vitals filed for this visit.   Subjective Assessment - 08/02/20 1107    Subjective Pt had her surgery on 07/13/20. Evrything went well but I was allergic to the glue and had a rash over my incisions.  Used Benadryl cream for a little while,then another cream that made me burn. I started using vitamin E and coconut oil and that worked better.  I still have it some on the lymph node incision and around my under arm.  The port incision is better.  Had first infusion on Feb. 8, 2022 but had some quesiness 2 days afterwards.  Have the infusion every 3 weeks. Have been doing the exercises without any trouble and armpit area feels a little tight still.No present pain. I have been wearing a longer line bra and that seems to decrease the achying in my breast.    Pertinent History Pt with prior history of right breast cancer  with  with surgery on  07/17/17 for right lumpectomy and SLNB with 5 LN removed.  Had chemo and radiation. Most recent surgery1/25/21 for left breast lumpectomy with 0/5 positive nodes. She will have chemo, radiation and infusions for immunotherapy.    Patient Stated Goals Want to decrease tightness in axilla and knot in incision region., decrease swelling in left breast    Currently in Pain? No/denies    Pain Score 0-No pain    Pain Descriptors / Indicators Tightness              [facility] PT Assessment - 08/02/20 0001      Assessment   Medical Diagnosis Left Breast Cancer    Referring Provider (PT)  Dr. Donne Hazel    Onset Date/Surgical Date 07/13/20    Hand Dominance Right      Precautions   Precaution Comments lymphedema risk      Observation/Other Assessments   Observations slight right skin darkening from prior radiation    Skin Integrity 2 incisions healed with slight thickening.  Mild resolving rash noted at breast/axillary region.  No cording felt.      Observation/Other Assessments-Edema    Edema --   mild edema noted left lateral breast, incisions     AROM   Left Shoulder Extension 54 Degrees    Left Shoulder Flexion 161 Degrees    Left Shoulder ABduction 152 Degrees    Left Shoulder Internal Rotation 78 Degrees    Left Shoulder External Rotation 87 Degrees             LYMPHEDEMA/ONCOLOGY QUESTIONNAIRE - 08/02/20 0001      Surgeries   Lumpectomy Date 07/17/17   right   Other Surgery Date 07/13/20   Left lumpectomy with SLNB   Number Lymph Nodes Removed 5      Treatment   Active Chemotherapy Treatment Yes    Date 07/27/20    Past Chemotherapy Treatment Yes    Past Radiation Treatment --   May 2019   Current Hormone Treatment No    Past Hormone Therapy No      What other symptoms do you have   Are you Having Heaviness or Tightness Yes    Are you having Pain No    Are you having pitting edema No    Is it Hard or Difficult finding clothes that fit No    Do you have infections No    Is there Decreased scar mobility Yes    [place]emmer Sign No      Left Upper Extremity Lymphedema   15 cm Proximal to Olecranon Process 31.8 cm    10 cm Proximal to Olecranon Process 28.5 cm    Olecranon Process 24.2 cm    15 cm Proximal to Ulnar Styloid Process 20.6 cm    10 cm Proximal to Ulnar Styloid Process 17 cm    Just Proximal to Ulnar Styloid Process 13.8 cm    Across Hand at [employer] 16.9 cm    At [place] of 2nd Digit 5.5 cm                      [facility] Adult PT Treatment/Exercise - 08/02/20 0001      Posture/Postural Control   Posture/Postural  Control Postural limitations    Postural Limitations Rounded Shoulders;Forward head      Shoulder Exercises: Supine   Other Supine Exercises Supine AA Flexion and stargazer x 5 ea      Shoulder Exercises: Standing   Other Standing Exercises  wall slides for abduction x 5      Manual Therapy   Passive ROM PROM left shoulder in all directions                       PT Long Term Goals - 08/02/20 1803      PT LONG TERM GOAL #1   Title Patient will demonstrate she has returned to baseline since surgery related to shoulder ROM and function.    Time 4    Period Weeks    Status New    Target Date 08/30/20      PT LONG TERM GOAL #2   Title Pt will be independent in left breast MLD    Time 3    Period Weeks    Status New    Target Date 08/23/20      PT LONG TERM GOAL #3   Title Pt will report decreased breast heaviness by 50% or greater    Time 4    Period Weeks    Status New      PT LONG TERM GOAL #4   Title Pt will be independent with scar massage    Time 2    Period Weeks    Status New    Target Date 08/16/20                 Plan - 08/02/20 1257    Clinical Impression Statement Pt is seen post op left breast lumpectomy with SLNB 0/5 LN positive.  She has been compliant with HEP and is doing well with ROM lacking a slight amount of left shoulder A/PROM abduction and minimally decreased in the others (3 deg).  She would benefit from attendance at [facility] class and did set that up today.  She is also complaining of some thickening at the incision sites and some left breast swelling and heaviness.  She has a bit of mild rash remaining from reaction to the glue used.She will benefit from instruction in scar massage, MLD and PROM.  She may also consider a new compression bra and was given the number to call for [facility].    Personal Factors and Comorbidities Comorbidity 2    Comorbidities Hx of bilateral breast CA with new onset of left Breast CA. ( right in  2019), lymphedema risk    Stability/Clinical Decision Making Stable/Uncomplicated    Rehab Potential Excellent    PT Frequency 2x / week    PT Duration 4 weeks    PT Treatment/Interventions ADLs/Self Care Home Management;Therapeutic exercise;Patient/family education;Manual techniques;Manual lymph drainage;Scar mobilization;Passive range of motion    PT Next Visit Plan instruct self breast MLD, PROM left shoulder, scar massage, progress to TB when ready, chip pack, compression bra?   PT Home Exercise Plan 4 post op exercises, [facility] class    Consulted and Agree with Plan of Care Patient           Patient will benefit from skilled therapeutic intervention in order to improve the following deficits and impairments:  Postural dysfunction,Decreased knowledge of precautions,Decreased scar mobility,Increased edema  Visit Diagnosis: Malignant neoplasm of upper-outer quadrant of left female breast, unspecified estrogen receptor status (HCC)  Abnormal posture  Edema, unspecified type     Problem List Patient Active Problem List   Diagnosis Date Noted  . Neuropathy due to chemotherapeutic drug ([place]) 07/23/2018  . Port-A-Cath in place 08/28/2017  . TIA (transient ischemic attack) 08/09/2017  . Hypertension   . Genetic  testing 07/06/2017  . Family history of ovarian cancer   . Family history of breast cancer   . Malignant neoplasm of upper-outer quadrant of right breast in female, estrogen receptor negative ([place]) 06/26/2017  . Malignant neoplasm of left breast, estrogen receptor positive ([place]) 05/19/2017    Claris Pong 08/02/2020, 6:06 PM  [place], [place], [address] Phone: [phone]   Fax:  [phone]  Name: TYFFANI FOGLESONG MRN: [id number] Date of Birth: Sep 29, 1950 Cheral Almas, PT 08/02/20 6:08 PM

## 2020-08-03 ENCOUNTER — Inpatient Hospital Stay: Payer: Medicare PPO

## 2020-08-03 ENCOUNTER — Inpatient Hospital Stay: Payer: Medicare PPO | Admitting: Oncology

## 2020-08-03 VITALS — BP 161/81 | HR 86 | Temp 98.1°F | Resp 18 | Ht 63.5 in | Wt 151.0 lb

## 2020-08-03 DIAGNOSIS — C50411 Malignant neoplasm of upper-outer quadrant of right female breast: Secondary | ICD-10-CM | POA: Diagnosis not present

## 2020-08-03 DIAGNOSIS — Z79899 Other long term (current) drug therapy: Secondary | ICD-10-CM | POA: Diagnosis not present

## 2020-08-03 DIAGNOSIS — Z171 Estrogen receptor negative status [ER-]: Secondary | ICD-10-CM | POA: Diagnosis not present

## 2020-08-03 DIAGNOSIS — G620 Drug-induced polyneuropathy: Secondary | ICD-10-CM | POA: Diagnosis not present

## 2020-08-03 DIAGNOSIS — C50212 Malignant neoplasm of upper-inner quadrant of left female breast: Secondary | ICD-10-CM | POA: Diagnosis not present

## 2020-08-03 DIAGNOSIS — Z5112 Encounter for antineoplastic immunotherapy: Secondary | ICD-10-CM | POA: Diagnosis not present

## 2020-08-03 DIAGNOSIS — Z95828 Presence of other vascular implants and grafts: Secondary | ICD-10-CM

## 2020-08-03 DIAGNOSIS — Z5111 Encounter for antineoplastic chemotherapy: Secondary | ICD-10-CM | POA: Diagnosis not present

## 2020-08-03 DIAGNOSIS — N281 Cyst of kidney, acquired: Secondary | ICD-10-CM | POA: Diagnosis not present

## 2020-08-03 DIAGNOSIS — T451X5A Adverse effect of antineoplastic and immunosuppressive drugs, initial encounter: Secondary | ICD-10-CM | POA: Diagnosis not present

## 2020-08-03 LAB — CMP (CANCER CENTER ONLY)
ALT: 15 U/L (ref 0–44)
AST: 16 U/L (ref 15–41)
Albumin: 4 g/dL (ref 3.5–5.0)
Alkaline Phosphatase: 60 U/L (ref 38–126)
Anion gap: 11 (ref 5–15)
BUN: 19 mg/dL (ref 8–23)
CO2: 26 mmol/L (ref 22–32)
Calcium: 9.7 mg/dL (ref 8.9–10.3)
Chloride: 102 mmol/L (ref 98–111)
Creatinine: 1.17 mg/dL — ABNORMAL HIGH (ref 0.44–1.00)
GFR, Estimated: 50 mL/min — ABNORMAL LOW (ref >60)
Glucose, Bld: 110 mg/dL — ABNORMAL HIGH (ref 70–99)
Potassium: 4.2 mmol/L (ref 3.5–5.1)
Sodium: 139 mmol/L (ref 135–145)
Total Bilirubin: 0.3 mg/dL (ref 0.3–1.2)
Total Protein: 7.5 g/dL (ref 6.5–8.1)

## 2020-08-03 LAB — CBC WITH DIFFERENTIAL (CANCER CENTER ONLY)
Abs Immature Granulocytes: 0.01 10*3/uL (ref 0.00–0.07)
Basophils Absolute: 0 10*3/uL (ref 0.0–0.1)
Basophils Relative: 1 %
Eosinophils Absolute: 0.1 10*3/uL (ref 0.0–0.5)
Eosinophils Relative: 4 %
HCT: 30.9 % — ABNORMAL LOW (ref 36.0–46.0)
Hemoglobin: 10.1 g/dL — ABNORMAL LOW (ref 12.0–15.0)
Immature Granulocytes: 0 %
Lymphocytes Relative: 49 %
Lymphs Abs: 1.6 10*3/uL (ref 0.7–4.0)
MCH: 28.9 pg (ref 26.0–34.0)
MCHC: 32.7 g/dL (ref 30.0–36.0)
MCV: 88.5 fL (ref 80.0–100.0)
Monocytes Absolute: 0.3 10*3/uL (ref 0.1–1.0)
Monocytes Relative: 8 %
Neutro Abs: 1.3 10*3/uL — ABNORMAL LOW (ref 1.7–7.7)
Neutrophils Relative %: 38 %
Platelet Count: 269 10*3/uL (ref 150–400)
RBC: 3.49 MIL/uL — ABNORMAL LOW (ref 3.87–5.11)
RDW: 12.6 % (ref 11.5–15.5)
WBC Count: 3.3 10*3/uL — ABNORMAL LOW (ref 4.0–10.5)
nRBC: 0 % (ref 0.0–0.2)

## 2020-08-03 MED ORDER — HEPARIN SOD (PORK) LOCK FLUSH 100 UNIT/ML IV SOLN
500.0000 [IU] | Freq: Once | INTRAVENOUS | Status: AC
  Administered 2020-08-03: 500 [IU]
  Filled 2020-08-03: qty 5

## 2020-08-03 MED ORDER — SODIUM CHLORIDE 0.9% FLUSH
10.0000 mL | Freq: Once | INTRAVENOUS | Status: AC
  Administered 2020-08-03: 10 mL
  Filled 2020-08-03: qty 10

## 2020-08-03 NOTE — Patient Instructions (Signed)

## 2020-08-03 NOTE — Progress Notes (Signed)
April [name]  [phone] [phone]     ID: April [name] DOB: [birth year]  MR#: [id number]  JSH#:[id number]  Patient Care Team: Kelton Pillar, MD as PCP - General (Family Medicine) Chandria Rookstool, Virgie Dad, MD as Consulting Physician (Oncology) Rolm Bookbinder, MD as Consulting Physician (General Surgery) Eppie Gibson, MD as Attending Physician (Radiation Oncology) Newt Minion, MD as Consulting Physician (Orthopedic Surgery) Regal, Tamala Fothergill, DPM as Consulting Physician (Podiatry) Neldon Mc, Donnamarie Poag, MD as Consulting Physician (Allergy and Immunology) Mauro Kaufmann, RN as Oncology Nurse Navigator Rockwell Germany, RN as Oncology Nurse Navigator OTHER MD:  CHIEF COMPLAINT: Estrogen receptor negative breast cancer  CURRENT TREATMENT: Adjuvant chemo immunotherapy   INTERVAL HISTORY: April [name] returns today for follow up and treatment of her new left breast cancer.   She began adjuvant CMF therapy at her last visit on 07/27/2020.  Today is day 8 cycle 1.   REVIEW OF SYSTEMS: April [name] did generally well with her first cycle of CMF.  She had nausea on days 4 and 5 but no vomiting.  She was not taking Compazine at that time.  When she did take it it was helpful.  She did become constipated for the first several days.  She took 3 stool softeners which have not quite resolved the problem.  She has had minimal fatigue.  She is still doing normal activities.  She had no problems with fever, blurred vision, or mouth sores.  Her port worked well.   COVID 19 VACCINATION STATUS:    LEFT BREAST CANCER HISTORY: From the original intake note:  She underwent bilateral diagnostic mammography with tomography at [facility] on 06/15/2020 showing: breast density category C; this showed in the right breast evidence of postsurgical and radiation changes but this was all stable.  On the left however there was a new group of pleomorphic calcifications in the upper inner  quadrant..  She proceeded to biopsy of the left breast area in question on 06/15/2020. Pathology from the procedure ([id number]) showed: invasive ductal carcinoma, grade 3; high-grade ductal carcinoma in situ with necrosis; lymphovascular invasion identified. Prognostic indicators significant for: estrogen receptor 40% positive with weak staining intensity; progesterone receptor 0% negative. Proliferation marker Ki67 of 45%. Her2 positive by immunohistochemistry (3+).   RIGHT BREAST CANCER HISTORY: From the original intake note:  "Ercie" had bilateral screening mammography at [facility] 06/06/2017.  This showed a possible mass in the right breast at the 11 o'clock position.  On 06/13/2017 she underwent right diagnostic mammography and ultrasonography.  Breast density was category C.  In the right breast at the 11 o'clock position there was a 1 cm area by mammography.  By ultrasound this confirmed a 1.0 cm irregular mass with lobulated margins in the upper outer quadrant of the right breast.  There was a second, 0.4 cm lobulated mass in the same quadrant.  The right axilla was sonographically benign.  On 06/20/2017 biopsy of the 2 right breast masses in question was performed.  The final pathology ([id number]) found the smaller mass to be only fibrocystic change.  This is felt to be concordant.  The larger mass however was an invasive ductal carcinoma, grade 3, estrogen and progesterone receptor negative, with an MIB-1 of 30%, and HER-2 amplified, with a signals ratio of 2.24.  The number per cell was 4.60.  The patient's subsequent history is as detailed below.   PAST MEDICAL HISTORY: Past Medical History:  Diagnosis Date  . Breast cancer ([place]) 05/2017  right breast  . Eczema   . Family history of breast cancer   . Family history of ovarian cancer   . History of radiation therapy 10/17/17- 11/14/17   40.05 directed to the right breast in 15 fractions, followed by a boost of 10 gy given in 5  fractions.   . Hypertension    toxemia during pregnancy, no meds now    PAST SURGICAL HISTORY: Past Surgical History:  Procedure Laterality Date  . ABDOMINAL HYSTERECTOMY    . BREAST LUMPECTOMY WITH RADIOACTIVE SEED AND SENTINEL LYMPH NODE BIOPSY Right 07/17/2017   Procedure: BREAST LUMPECTOMY WITH RADIOACTIVE SEED AND SENTINEL LYMPH NODE BIOPSY;  Surgeon: Rolm Bookbinder, MD;  Location: [place];  Service: General;  Laterality: Right;  . BREAST LUMPECTOMY WITH RADIOACTIVE SEED AND SENTINEL LYMPH NODE BIOPSY Left 07/13/2020   Procedure: LEFT BREAST LUMPECTOMY WITH RADIOACTIVE SEED AND LEFT AXILLARY SENTINEL LYMPH NODE BIOPSY;  Surgeon: Rolm Bookbinder, MD;  Location: [place];  Service: General;  Laterality: Left;  . CESAREAN SECTION     x4  . DILATION AND CURETTAGE OF UTERUS    . KNEE SURGERY Right   . PORT-A-CATH REMOVAL Right 09/04/2018   Procedure: REMOVAL PORT-A-CATH;  Surgeon: Rolm Bookbinder, MD;  Location: [place];  Service: General;  Laterality: Right;  . PORTACATH PLACEMENT N/A 07/17/2017   Procedure: INSERTION PORT-A-CATH WITH [place];  Surgeon: Rolm Bookbinder, MD;  Location: [place];  Service: General;  Laterality: N/A;  . PORTACATH PLACEMENT Right 07/13/2020   Procedure: INSERTION PORT-A-CATH WITH ULTRASOUND GUIDANCE;  Surgeon: Rolm Bookbinder, MD;  Location: [place];  Service: General;  Laterality: Right;    FAMILY HISTORY Family History  Problem Relation Age of Onset  . Breast cancer Mother 70       again at 44 in other breast   . Heart attack Father 43  . Breast cancer Sister 47  . Breast cancer Maternal Grandmother 59       spread to lungs, died at 73  . Ovarian cancer Cousin 48  . Prostate cancer Cousin   The patient's father died at age 47 from a heart attack.  The patient's mother is currently living at age 70 (as of January 2019).  The patient had 1 sister who was  diagnosed with breast cancer at age 32 and died from metastatic disease at age 46.  The patient has 1 brother.  In addition the patient's mother was diagnosed with breast cancer at age 4, on the left side, and now has a right-sided breast cancer diagnosed in January 2019.  There is in addition a cousin with ovarian cancer diagnosed when she was 71 years old   GYNECOLOGIC HISTORY:  No LMP recorded. Patient has had a hysterectomy. Menarche age 62, first live birth age 74, the patient had 3 live births, 1 of whom survived only 2 days.  She underwent hysterectomy without salpingo-oophorectomy September 05, 1978.  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  Her husband [name] his Theme park manager at [place].  The patient's daughter [name] lives in [place] where she is an Tourist information centre manager.  The patient's daughter [name] lives in [place] working for the department of defense.  The patient has 3 grandchildren, 1 of whom is in the seventh grade but  is actually the captain of the eighth grade basketball team and is playing in a championship February 2020.   ADVANCED DIRECTIVES: Not in place   HEALTH MAINTENANCE: Social History   Tobacco Use  . Smoking status: Never Smoker  . Smokeless tobacco: Never Used  Vaping Use  . Vaping Use: Never used  Substance Use Topics  . Alcohol use: Yes    Comment: social  . Drug use: No     Colonoscopy: April 2018/[facility]  PAP: Status post hysterectomy  Bone density:   Allergies  Allergen Reactions  . Tape Itching and Rash  . Nsaids     Stomach issue  . Sulfa Antibiotics Rash    Current Outpatient Medications  Medication Sig Dispense Refill  . CALCIUM-VITAMIN D PO Take 1 tablet by mouth daily.    Marland Kitchen co-enzyme Q-10 30 MG capsule Take 30 mg by mouth daily.    .  hydrochlorothiazide (MICROZIDE) 12.5 MG capsule TAKE 1 CAPSULE BY MOUTH EVERY DAY 90 capsule 3  . lidocaine-prilocaine (EMLA) cream Apply 1 application topically as needed. 30 g 0  . lidocaine-prilocaine (EMLA) cream Apply to affected area once 30 g 3  . Omega-3 Fatty Acids (FISH OIL) 1000 MG CAPS Take 1 capsule by mouth daily.    . prochlorperazine (COMPAZINE) 10 MG tablet Take 1 tablet (10 mg total) by mouth every 6 (six) hours as needed (Nausea or vomiting). 30 tablet 1  . Red Yeast Rice Extract (RED YEAST RICE PO) Take by mouth.    . traMADol (ULTRAM) 50 MG tablet Take 1 tablet (50 mg total) by mouth every 6 (six) hours as needed. 10 tablet 0  . vitamin B-12 (CYANOCOBALAMIN) 1000 MCG tablet Take 1,000 mcg by mouth daily.    . vitamin C (ASCORBIC ACID) 250 MG tablet Take 250 mg by mouth daily.     No current facility-administered medications for this visit.    OBJECTIVE: African-American [name] in no acute distress Vitals:   08/03/20 1504  BP: (!) 161/81  Pulse: 86  Resp: 18  Temp: 98.1 F (36.7 C)  SpO2: 100%     Body mass index is 26.33 kg/m.    Wt Readings from Last 3 Encounters:  08/03/20 151 lb (68.5 kg)  07/26/20 150 lb 9.6 oz (68.3 kg)  07/13/20 154 lb 5.2 oz (70 kg)  ECOG FS:1 - Symptomatic but completely ambulatory   Sclerae unicteric, EOMs intact Wearing a mask No cervical or supraclavicular adenopathy Lungs no rales or rhonchi Heart regular rate and rhythm Abd soft, nontender, positive bowel sounds MSK no focal spinal tenderness, no upper extremity lymphedema Neuro: nonfocal, well oriented, appropriate affect Breasts: The right breast is status post lumpectomy and radiation.  There are the expected skin changes but no evidence of local recurrence.  The left breast status post more recent lumpectomy.  There is no evidence of residual recurrent disease.  Both axillae are benign.   LAB RESULTS:  CMP     Component Value Date/Time   NA 139 08/03/2020 1441   K  4.2 08/03/2020 1441   CL 102 08/03/2020 1441   CO2 26 08/03/2020 1441   GLUCOSE 110 (H) 08/03/2020 1441   BUN 19 08/03/2020 1441   CREATININE 1.17 (H) 08/03/2020 1441   CALCIUM 9.7 08/03/2020 1441   PROT 7.5 08/03/2020 1441   ALBUMIN 4.0 08/03/2020 1441   AST 16 08/03/2020 1441   ALT 15 08/03/2020 1441   ALKPHOS 60 08/03/2020 1441   BILITOT 0.3 08/03/2020 1441  GFRNONAA 50 (L) 08/03/2020 1441   GFRAA >60 07/10/2019 1008   GFRAA >60 06/27/2017 0843    No results found for: TOTALPROTELP, ALBUMINELP, A1GS, A2GS, BETS, BETA2SER, GAMS, MSPIKE, SPEI  No results found for: KPAFRELGTCHN, LAMBDASER, KAPLAMBRATIO  Lab Results  Component Value Date   WBC 3.3 (L) 08/03/2020   NEUTROABS 1.3 (L) 08/03/2020   HGB 10.1 (L) 08/03/2020   HCT 30.9 (L) 08/03/2020   MCV 88.5 08/03/2020   PLT 269 08/03/2020   No results found for: LABCA2  No components found for: CWCBJS283  No results for input(s): INR in the last 168 hours.  No results found for: LABCA2  No results found for: TDV761  No results found for: YWV371  No results found for: GGY694  No results found for: CA2729  No components found for: HGQUANT  No results found for: CEA1 / No results found for: CEA1   No results found for: AFPTUMOR  No results found for: CHROMOGRNA  No results found for: HGBA, HGBA2QUANT, HGBFQUANT, HGBSQUAN (Hemoglobinopathy evaluation)   No results found for: LDH  Lab Results  Component Value Date   IRON 77 05/21/2018   TIBC 404 05/21/2018   IRONPCTSAT 19 (L) 05/21/2018   (Iron and TIBC)  Lab Results  Component Value Date   FERRITIN 24 05/21/2018    Urinalysis    Component Value Date/Time   COLORURINE COLORLESS (A) 08/09/2017 2224   APPEARANCEUR CLEAR 08/09/2017 2224   LABSPEC 1.004 (L) 08/09/2017 2224   PHURINE 6.0 08/09/2017 2224   GLUCOSEU NEGATIVE 08/09/2017 2224   HGBUR NEGATIVE 08/09/2017 2224   BILIRUBINUR NEGATIVE 08/09/2017 2224   KETONESUR NEGATIVE 08/09/2017 2224    PROTEINUR NEGATIVE 08/09/2017 2224   NITRITE NEGATIVE 08/09/2017 2224   LEUKOCYTESUR NEGATIVE 08/09/2017 2224    STUDIES: DG Chest 1 View  Result Date: 07/13/2020 CLINICAL DATA:  Port-A-Cath placement. EXAM: DG C-ARM 1-60 MIN; CHEST  1 VIEW COMPARISON:  Chest CT 07/08/2020 FLUOROSCOPY TIME:  Fluoroscopy Time:  20 seconds Radiation Exposure Index (if provided by the fluoroscopic device): 1.60 mGy FINDINGS: A single intraoperative spot fluoroscopic image of the upper chest is provided. A right jugular Port-A-Cath has been placed. The tip of the catheter is suboptimally visualized due to fluoroscopic technique and superimposition of the spine but likely terminates over the upper SVC. IMPRESSION: Intraoperative image during Port-A-Cath placement as above. Electronically Signed   By: Logan Bores M.D.   On: 07/13/2020 16:17   CT Chest W Contrast  Result Date: 07/08/2020 CLINICAL DATA:  Breast cancer.  Restaging. EXAM: CT CHEST WITH CONTRAST TECHNIQUE: Multidetector CT imaging of the chest was performed during intravenous contrast administration. CONTRAST:  52m OMNIPAQUE IOHEXOL 300 MG/ML  SOLN COMPARISON:  None. FINDINGS: Cardiovascular: The heart size is normal. No substantial pericardial effusion. Mediastinum/Nodes: No mediastinal lymphadenopathy. There is no hilar lymphadenopathy. The esophagus has normal imaging features. There is no axillary lymphadenopathy. Lungs/Pleura: No suspicious pulmonary nodule or mass. No focal airspace consolidation. No pleural effusion. Upper Abdomen: Small hypodensities in the lateral segment left liver cannot be definitively characterized but are likely benign and probably cysts. Small area of low attenuation in the anterior liver, adjacent to the falciform ligament, is in a characteristic location for focal fatty deposition. 4.8 cm cyst noted interpolar left kidney. Musculoskeletal: No worrisome lytic or sclerotic osseous abnormality. No worrisome lytic or sclerotic  osseous abnormality. IMPRESSION: 1. No evidence for metastatic disease in the chest. 2. Small hypodensities in the lateral segment left liver cannot be  definitively characterized but are likely benign. 3. 4.8 cm left renal cyst. Electronically Signed   By: Misty Stanley M.D.   On: 07/08/2020 14:03   NM Bone Scan Whole Body  Result Date: 07/09/2020 CLINICAL DATA:  Left breast cancer, scheduled for lumpectomy next Tuesday. History of right breast cancer in 2019, status post lumpectomy, chemotherapy, radiation, and immunotherapy. EXAM: NUCLEAR MEDICINE WHOLE BODY BONE SCAN TECHNIQUE: Whole body anterior and posterior images were obtained approximately 3 hours after intravenous injection of radiopharmaceutical. RADIOPHARMACEUTICALS:  21.1 mCi Technetium-78mMDP IV COMPARISON:  CT chest dated 07/08/2020 FINDINGS: No abnormal accumulation of radiotracer within the axillary or appendicular skeleton to suggest skeletal metastases. Degenerative uptake in the right elbow and left wrist. Excretory radiotracer in the bladder. IMPRESSION: No scintigraphic evidence of skeletal metastases. Electronically Signed   By: SJulian HyM.D.   On: 07/09/2020 13:34   NM Sentinel Node Inj-No Rpt (Breast)  Result Date: 07/13/2020 Sulfur colloid was injected by the nuclear medicine technologist for melanoma sentinel node.   MM Breast Surgical Specimen  Result Date: 07/13/2020 CLINICAL DATA:  Status post lumpectomy today after earlier radioactive seed localization. EXAM: SPECIMEN RADIOGRAPH OF THE LEFT BREAST COMPARISON:  Previous exam(s). FINDINGS: Status post excision of the left breast. The radioactive seed and biopsy marker clip are present and completely intact. Findings discussed with the OR staff during the procedure. IMPRESSION: Specimen radiograph of the left breast. Electronically Signed   By: SFranki CabotM.D.   On: 07/13/2020 11:15   DG C-Arm 1-60 Min  Result Date: 07/13/2020 CLINICAL DATA:  Port-A-Cath  placement. EXAM: DG C-ARM 1-60 MIN; CHEST  1 VIEW COMPARISON:  Chest CT 07/08/2020 FLUOROSCOPY TIME:  Fluoroscopy Time:  20 seconds Radiation Exposure Index (if provided by the fluoroscopic device): 1.60 mGy FINDINGS: A single intraoperative spot fluoroscopic image of the upper chest is provided. A right jugular Port-A-Cath has been placed. The tip of the catheter is suboptimally visualized due to fluoroscopic technique and superimposition of the spine but likely terminates over the upper SVC. IMPRESSION: Intraoperative image during Port-A-Cath placement as above. Electronically Signed   By: ALogan BoresM.D.   On: 07/13/2020 16:17   MM LT RADIOACTIVE SEED LOC MAMMO GUIDE  Result Date: 07/12/2020 CLINICAL DATA:  Patient presents for seed localization prior to LEFT lumpectomy for recently diagnosed grade 3 invasive ductal carcinoma. EXAM: MAMMOGRAPHIC GUIDED RADIOACTIVE SEED LOCALIZATION OF THE LEFT BREAST COMPARISON:  Previous exam(s). FINDINGS: Patient presents for radioactive seed localization prior to lumpectomy. I met with the patient and we discussed the procedure of seed localization including benefits and alternatives. We discussed the high likelihood of a successful procedure. We discussed the risks of the procedure including infection, bleeding, tissue injury and further surgery. We discussed the low dose of radioactivity involved in the procedure. Informed, written consent was given. The usual time-out protocol was performed immediately prior to the procedure. Using mammographic guidance, sterile technique, 1% lidocaine and an I-125 radioactive seed, the barbell shaped clip in the LATERAL portion of the LEFT breast was localized using a LATERAL to MEDIAL approach. The follow-up mammogram images confirm the seed in the expected location and were marked for Dr. WDonne Hazel Follow-up survey of the patient confirms presence of the radioactive seed. Order number of I-125 seed:  [id number] Total activity:   03.785millicuries reference Date: 03/23/2020 The patient tolerated the procedure well and was released from the [facility] She was given instructions regarding seed removal. IMPRESSION: Radioactive seed localization left breast.  No apparent complications. Electronically Signed   By: Nolon Nations M.D.   On: 07/12/2020 14:04     ELIGIBLE FOR AVAILABLE RESEARCH PROTOCOL: no   ASSESSMENT: 70 y.o. April [name]   RIGHT BREAST CANCER (0) status post right breast upper outer quadrant biopsy 06/20/2017 for a clinical T1b N0, stage IA invasive ductal carcinoma, grade 3, estrogen and progesterone receptor negative, but HER-2 amplified, with an MIB-1 of 30%  (1) genetics testing 07/05/2017 through the Common Hereditary Cancer Panel offered by [facility] found no deleterious mutations in APC, ATM, AXIN2, BARD1, BMPR1A, BRCA1, BRCA2, BRIP1, CDH1, CDKN2A (p14ARF), CDKN2A (p16INK4a), CKD4, CHEK2, CTNNA1, DICER1, EPCAM (Deletion/duplication testing only), GREM1 (promoter region deletion/duplication testing only), KIT, MEN1, MLH1, MSH2, MSH3, MSH6, MUTYH, NBN, NF1, NHTL1, PALB2, PDGFRA, PMS2, POLD1, POLE, PTEN, RAD50, RAD51C, RAD51D, SDHB, SDHC, SDHD, SMAD4, SMARCA4. STK11, TP53, TSC1, TSC2, and VHL.  The following genes were evaluated for sequence changes only: SDHA and HOXB13 c.251G>A variant only.  (a) a Variant of uncertain significance in MSH2 was identified c.1331G>T (p.Arg444Leu).   (2) status post right lumpectomy and sentinel lymph node sampling 07/17/2017 for a pT1c pN0, stage IA invasive ductal carcinoma, grade 2, with negative margins.  A total of 5 lymph nodes were removed  (3) adjuvant chemotherapy consisting of paclitaxel weekly x12 together with trastuzumab every 21 days starting 08/07/2017  (a) paclitaxel stopped after 8 doses because of neuropathy (last dose 09/25/2017  (4) trastuzumab continued to total 1 year (last dose 07/23/2018)  (a) echo 08/10/2017 showed an  ejection fraction in the 65-70% range  (b) echo on 11/01/2017 shows EF of 60-65%  (c) cardiogram 02/05/2018 shows an ejection fraction in the 60-65% range.  (d) echocardiogram 05/28/2018 shows an ejection fraction in the 65-70%.  (5) adjuvant radiation 10/17/2017-11/14/2016: 40.05 Gy directed to the Right Breast in 15 fractions, followed by a boost of 10 Gy given in 5 fractions   (6) anemia, with normal MCV, ferritin, B12, folate, and inappropriately normal reticulocyte count, consistent with anemia of chronic illness.  LEFT BREAST CANCER: (7) status post left breast upper inner quadrant biopsy 06/15/2020 for a clinical T1 N0 invasive ductal carcinoma, grade 3, weakly estrogen receptor positive, progesterone receptor negative, HER2 amplified, with an MIB-1 of 45%.  (8) status post left lumpectomy and sentinel lymph node sampling 07/13/2020 for a pT1c pN0, stage IB invasive ductal carcinoma, grade 3, with negative margins  (a) a total of 5 axilla lymph nodes were removed, all benign  (b) repeat prognostic panel again weakly estrogen receptor positive, progesterone receptor negative, now HER-2 not amplified  (9) adjuvant chemotherapy and anti-HER2 immunotherapy to start 07/27/2020, consisting of CMF chemotherapy to be repeated every 21 days and trastuzumab to be continued for 1 year.  (a) echocardiogram 05/28/2020 shows an ejection fraction in the 65-70% range  (10) trastuzumab to be continued a minimum of 1 year  (11) adjuvant radiation can be given concurrently with CMF  (12) to start letrozole at the completion of local treatment   PLAN: Jansen tolerated her first cycle of CMF chemotherapy generally quite well.  She is also receiving trastuzumab and she has no side effects from that.  She is due for repeat echocardiography 08/13/2020.  I suggested that she start stool softeners, 2 tablets twice daily, on the day of chemo, and also MiraLAX daily, and continue that combination for at least  3 days maximum 5.  I think that will take care of the constipation issue.  She will take  Compazine on days 3, 4 and 5, on a twice daily basis, or more frequently as needed.  I think that will take care of the nausea problem  Otherwise we will have to watch her counts.  ANC today is only 1.3.  If her counts look low the next cycle we may have to add growth factor  She knows to call for any other problem that may develop before the next visit  Total encounter time 30 minutes.*   Esgar Barnick, Virgie Dad, MD  08/03/20 5:37 PM Medical Oncology and Hematology [facility] [place] [place], [place] [address] Tel. [phone]    Fax. [phone]   I, Wilburn Mylar, am acting as scribe for Dr. Virgie Dad. Greysen Devino.  I, Lurline Del MD, have reviewed the above documentation for accuracy and completeness, and I agree with the above.   *Total Encounter Time as defined by the Centers for Medicare and Medicaid Services includes, in addition to the face-to-face time of a patient visit (documented in the note above) non-face-to-face time: obtaining and reviewing outside history, ordering and reviewing medications, tests or procedures, care coordination (communications with other health care professionals or caregivers) and documentation in the medical record.

## 2020-08-04 ENCOUNTER — Other Ambulatory Visit: Payer: Self-pay

## 2020-08-04 ENCOUNTER — Ambulatory Visit: Payer: Medicare PPO

## 2020-08-04 ENCOUNTER — Telehealth: Payer: Self-pay | Admitting: Oncology

## 2020-08-04 ENCOUNTER — Encounter: Payer: Self-pay | Admitting: *Deleted

## 2020-08-04 DIAGNOSIS — C50412 Malignant neoplasm of upper-outer quadrant of left female breast: Secondary | ICD-10-CM

## 2020-08-04 DIAGNOSIS — R293 Abnormal posture: Secondary | ICD-10-CM

## 2020-08-04 DIAGNOSIS — R609 Edema, unspecified: Secondary | ICD-10-CM

## 2020-08-04 NOTE — Therapy (Signed)
[place], [place], [address] Phone: [phone]   Fax:  [phone]  Physical Therapy Treatment  Patient Details  Name: April [name] MRN: [id number] Date of Birth: 04/20/1951 Referring Provider (PT): Dr. Donne Hazel   Encounter Date: 08/04/2020   PT End of Session - 08/04/20 1638    Visit Number 3    Number of Visits 11    Date for PT Re-Evaluation 08/30/20    PT Start Time 2683    PT Stop Time 1501    PT Time Calculation (min) 58 min    Activity Tolerance Patient tolerated treatment well    Behavior During Therapy [facility] for tasks assessed/performed           Past Medical History:  Diagnosis Date  . Breast cancer ([place]) 05/2017   right breast  . Eczema   . Family history of breast cancer   . Family history of ovarian cancer   . History of radiation therapy 10/17/17- 11/14/17   40.05 directed to the right breast in 15 fractions, followed by a boost of 10 gy given in 5 fractions.   . Hypertension    toxemia during pregnancy, no meds now    Past Surgical History:  Procedure Laterality Date  . ABDOMINAL HYSTERECTOMY    . BREAST LUMPECTOMY WITH RADIOACTIVE SEED AND SENTINEL LYMPH NODE BIOPSY Right 07/17/2017   Procedure: BREAST LUMPECTOMY WITH RADIOACTIVE SEED AND SENTINEL LYMPH NODE BIOPSY;  Surgeon: Rolm Bookbinder, MD;  Location: [place];  Service: General;  Laterality: Right;  . BREAST LUMPECTOMY WITH RADIOACTIVE SEED AND SENTINEL LYMPH NODE BIOPSY Left 07/13/2020   Procedure: LEFT BREAST LUMPECTOMY WITH RADIOACTIVE SEED AND LEFT AXILLARY SENTINEL LYMPH NODE BIOPSY;  Surgeon: Rolm Bookbinder, MD;  Location: [place];  Service: General;  Laterality: Left;  . CESAREAN SECTION     x4  . DILATION AND CURETTAGE OF UTERUS    . KNEE SURGERY Right   . PORT-A-CATH REMOVAL Right 09/04/2018   Procedure: REMOVAL PORT-A-CATH;  Surgeon: Rolm Bookbinder, MD;   Location: [place];  Service: General;  Laterality: Right;  . PORTACATH PLACEMENT N/A 07/17/2017   Procedure: INSERTION PORT-A-CATH WITH [place];  Surgeon: Rolm Bookbinder, MD;  Location: [place];  Service: General;  Laterality: N/A;  . PORTACATH PLACEMENT Right 07/13/2020   Procedure: INSERTION PORT-A-CATH WITH ULTRASOUND GUIDANCE;  Surgeon: Rolm Bookbinder, MD;  Location: [place];  Service: General;  Laterality: Right;    There were no vitals filed for this visit.   Subjective Assessment - 08/04/20 1405    Subjective Did fine after last visit.  Pt reports doing the exercises and it feels like ROM is improving especially fo Abd. No present pain, but still have tightness.    Pertinent History Pt with prior history of right breast cancer  with  with surgery on 07/17/17 for right lumpectomy and SLNB with 5 LN removed.  Had chemo and radiation. Most recent surgery1/25/21 for left breast lumpectomy with 0/5 positive nodes. She will have chemo, radiation and infusions for immunotherapy.    Patient Stated Goals Want to decrease tightness in axilla and knot in incision region., decrease swelling in left breast    Currently in Pain? No/denies    Pain Score 0-No pain                             [facility] Adult  PT Treatment/Exercise - 08/04/20 0001      Posture/Postural Control   Posture/Postural Control Postural limitations    Postural Limitations Rounded Shoulders;Forward head      Shoulder Exercises: Supine   Other Supine Exercises Supine AA Flexion and stargazer x 5 ea      Manual Therapy   Edema Management Pt was instructed in self breast MLD in supine as performed by therapist with occasional VC's and TC's    Soft tissue mobilization gentle scar massage to incision sites    Myofascial Release checked axilla for cording. 1 possible small cord felt and worked on briefly    Manual Lymphatic Drainage (MLD) Therapist performed  supraclavicular, left axillary LN, left axillo-inguinal pathway, and left lateral breast/trunk in supine and SL retracing all steps and ending with LN's    Passive ROM PROM left shoulder in all directions                       PT Long Term Goals - 08/02/20 1803      PT LONG TERM GOAL #1   Title Patient will demonstrate she has returned to baseline since surgery related to shoulder ROM and function.    Time 4    Period Weeks    Status New    Target Date 08/30/20      PT LONG TERM GOAL #2   Title Pt will be independent in left breast MLD    Time 3    Period Weeks    Status New    Target Date 08/23/20      PT LONG TERM GOAL #3   Title Pt will report decreased breast heaviness by 50% or greater    Time 4    Period Weeks    Status New      PT LONG TERM GOAL #4   Title Pt will be independent with scar massage    Time 2    Period Weeks    Status New    Target Date 08/16/20                 Plan - 08/04/20 1502    Clinical Impression Statement very mild redness still noted around areola but per pt is very much improved.  Pt doing very nicely with left shoulder ROM.  She was instructed in self MLD today to left inguinal region only and did very well requiring occasional VC's and Tactile cues.   She was given written instructions for proper order. She was given a prescription to go to [facility] and get a Compression bra.    Personal Factors and Comorbidities Comorbidity 2    Comorbidities Hx of bilateral breast CA with new onset of left Breast CA. ( right in 2019), lymphedema risk    Stability/Clinical Decision Making Stable/Uncomplicated    Rehab Potential Excellent    PT Frequency 2x / week    PT Duration 4 weeks    PT Treatment/Interventions ADLs/Self Care Home Management;Therapeutic exercise;Patient/family education;Manual techniques;Manual lymph drainage;Scar mobilization;Passive range of motion    PT Next Visit Plan review self MLD to left Inguinal  only, PROM left shoulder prn, scar massage.  Start theraband    PT Home Exercise Plan 4 post op exercises, ABC class    Consulted and Agree with Plan of Care Patient           Patient will benefit from skilled therapeutic intervention in order to improve the following deficits and impairments:  Postural  dysfunction,Decreased knowledge of precautions,Decreased scar mobility,Increased edema,Decreased range of motion  Visit Diagnosis: Abnormal posture  Edema, unspecified type  Malignant neoplasm of upper-outer quadrant of left female breast, unspecified estrogen receptor status ([place])     Problem List Patient Active Problem List   Diagnosis Date Noted  . Neuropathy due to chemotherapeutic drug ([place]) 07/23/2018  . Port-A-Cath in place 08/28/2017  . TIA (transient ischemic attack) 08/09/2017  . Hypertension   . Genetic testing 07/06/2017  . Family history of ovarian cancer   . Family history of breast cancer   . Malignant neoplasm of upper-outer quadrant of right breast in female, estrogen receptor negative ([place]) 06/26/2017  . Malignant neoplasm of left breast, estrogen receptor positive ([place]) 05/19/2017    Claris Pong 08/04/2020, 4:41 PM  [place], [place], [address] Phone: [phone]   Fax:  [phone]  Name: April [name] MRN: [id number] Date of Birth: 07/13/1950  Cheral Almas, PT 08/04/20 4:41 PM

## 2020-08-04 NOTE — Patient Instructions (Addendum)
Manual Lymph Drainage for Left Breast.  Do daily.  Do slowly. Use flat hands with just enough pressure to stretch the skin. Do not slide over the skin, but move the skin with the hand you're using. Lie down or sit comfortably (in a recliner, for example) to do this.  1) Hug yourself:  cross arms and do circles at collar bones near neck 5-7 times (to "wake up" lots of lymph nodes in this area). 2) Take slow deep breaths, allowing your belly to balloon out as your breathe in, 5x (to "wake up" abdominal lymph nodes to take on extra flui      2A Left Armpit LN; stretch skin in small upward circles 3) Left groin area, at panty line--stretch skin in small circles to stimulate lymph nodes 5-7x 4) Redirect fluid from left armpit toward left groin (cup your hand around the curve of your left side and do 3-4 "pumps" from armpit to groin) 3-4x down your side.  5) Direct fluid to treat all of  outer breast tissue downward and outward toward pathway established  that is aimed at the left groin.Marland Kitchen       5A Repeat # 4 3-4 Xs 6)  End with repeating 2A and #3  above.   [facility] [address] [place]   [address] [phone]

## 2020-08-04 NOTE — Telephone Encounter (Signed)
Scheduled appts per 2/15 los. Pt confirmed next appt date and time.

## 2020-08-09 ENCOUNTER — Ambulatory Visit: Payer: Medicare PPO

## 2020-08-09 ENCOUNTER — Other Ambulatory Visit: Payer: Self-pay

## 2020-08-09 DIAGNOSIS — C50412 Malignant neoplasm of upper-outer quadrant of left female breast: Secondary | ICD-10-CM | POA: Diagnosis not present

## 2020-08-09 DIAGNOSIS — R609 Edema, unspecified: Secondary | ICD-10-CM

## 2020-08-09 DIAGNOSIS — C50912 Malignant neoplasm of unspecified site of left female breast: Secondary | ICD-10-CM | POA: Diagnosis not present

## 2020-08-09 DIAGNOSIS — R293 Abnormal posture: Secondary | ICD-10-CM

## 2020-08-09 NOTE — Therapy (Signed)
[place], [place], [address] Phone: [phone]   Fax:  [phone]  Physical Therapy Treatment  Patient Details  Name: April [name] MRN: [id number] Date of Birth: 10-03-1950 Referring Provider (PT): Dr. Donne Hazel   Encounter Date: 08/09/2020   PT End of Session - 08/09/20 1522    Visit Number 4    Number of Visits 11    Date for PT Re-Evaluation 08/30/20    PT Start Time 2992    PT Stop Time 1510    PT Time Calculation (min) 62 min    Activity Tolerance Patient tolerated treatment well    Behavior During Therapy [facility] for tasks assessed/performed           Past Medical History:  Diagnosis Date  . Breast cancer ([place]) 05/2017   right breast  . Eczema   . Family history of breast cancer   . Family history of ovarian cancer   . History of radiation therapy 10/17/17- 11/14/17   40.05 directed to the right breast in 15 fractions, followed by a boost of 10 gy given in 5 fractions.   . Hypertension    toxemia during pregnancy, no meds now    Past Surgical History:  Procedure Laterality Date  . ABDOMINAL HYSTERECTOMY    . BREAST LUMPECTOMY WITH RADIOACTIVE SEED AND SENTINEL LYMPH NODE BIOPSY Right 07/17/2017   Procedure: BREAST LUMPECTOMY WITH RADIOACTIVE SEED AND SENTINEL LYMPH NODE BIOPSY;  Surgeon: Rolm Bookbinder, MD;  Location: [place];  Service: General;  Laterality: Right;  . BREAST LUMPECTOMY WITH RADIOACTIVE SEED AND SENTINEL LYMPH NODE BIOPSY Left 07/13/2020   Procedure: LEFT BREAST LUMPECTOMY WITH RADIOACTIVE SEED AND LEFT AXILLARY SENTINEL LYMPH NODE BIOPSY;  Surgeon: Rolm Bookbinder, MD;  Location: [place];  Service: General;  Laterality: Left;  . CESAREAN SECTION     x4  . DILATION AND CURETTAGE OF UTERUS    . KNEE SURGERY Right   . PORT-A-CATH REMOVAL Right 09/04/2018   Procedure: REMOVAL PORT-A-CATH;  Surgeon: Rolm Bookbinder, MD;   Location: [place];  Service: General;  Laterality: Right;  . PORTACATH PLACEMENT N/A 07/17/2017   Procedure: INSERTION PORT-A-CATH WITH [place];  Surgeon: Rolm Bookbinder, MD;  Location: [place];  Service: General;  Laterality: N/A;  . PORTACATH PLACEMENT Right 07/13/2020   Procedure: INSERTION PORT-A-CATH WITH ULTRASOUND GUIDANCE;  Surgeon: Rolm Bookbinder, MD;  Location: [place];  Service: General;  Laterality: Right;    There were no vitals filed for this visit.   Subjective Assessment - 08/09/20 1409    Subjective I got my compresion bra this am.  I want to be sure this has enough compression.  The first one I tried on felt like I was on the SWAT team. I still have some swelling, and I didn't do MLD last night.  I have done the MLD every day except yesterday.  Think I did pretty well with it the second day. Medial arm has alot of pulling and soreness .    Pertinent History Pt with prior history of right breast cancer  with  with surgery on 07/17/17 for right lumpectomy and SLNB with 5 LN removed.  Had chemo and radiation. Most recent surgery1/25/22 for left breast lumpectomy with 0/5 positive nodes. She will have chemo, radiation and infusions for immunotherapy.    Patient Stated Goals Want to decrease tightness in axilla and knot in incision region., decrease swelling in  left breast    Currently in Pain? Yes    Pain Score 5     Pain Location Axilla   and left medial arm   Pain Orientation Left    Pain Descriptors / Indicators Tightness;Aching    Pain Onset 1 to 4 weeks ago    Aggravating Factors  reaching, using left arm,    Multiple Pain Sites No                             [facility] Adult PT Treatment/Exercise - 08/09/20 0001      Shoulder Exercises: Supine   Horizontal ABduction Strengthening;Both;5 reps    Theraband Level (Shoulder Horizontal ABduction) Level 1 (Yellow)    External Rotation Strengthening;Both;5  reps    Theraband Level (Shoulder External Rotation) Level 1 (Yellow)    Flexion Strengthening;Both;5 reps    Theraband Level (Shoulder Flexion) Level 1 (Yellow)    Diagonals Strengthening;Right;Left;5 reps    Theraband Level (Shoulder Diagonals) Level 1 (Yellow)    Other Supine Exercises Supine AA flexion/scaption with wand X 5    Other Supine Exercises Supine AROM flex, scaption and horizontal abd x 5 in hooklying.      Manual Therapy   Edema Management Pt was instructed in self breast MLD in supine as performed by therapist with  VC's and TC's for stretch and technique.    Soft tissue mobilization gentle scar massage to incision sites    Myofascial Release MFR left axillary region and medial upper arm    Manual Lymphatic Drainage (MLD) Therapist performed supraclavicular, left axillary LN, left axillo-inguinal pathway, and left lateral breast/trunk in supine and left lateral upper arm, medial to lateral and lateral arm again  retracing all steps and ending with LN's    Passive ROM PROM left shoulder in all directions                  PT Education - 08/09/20 1520    Education Details pt was educated in supine scapular series exercises and performed each x 5 with yellow TB.  Pt was given illustrated and written instructions.  We also reviewed left breast MLD to left axillo-inguinal pathway only and pt required VC's but was improved from previous visit.    Person(s) Educated Patient    Methods Explanation;Demonstration;Handout    Comprehension Verbalized understanding;Returned demonstration;Need further instruction   further instruction needed for MLD              PT Long Term Goals - 08/02/20 1803      PT LONG TERM GOAL #1   Title Patient will demonstrate she has returned to baseline since surgery related to shoulder ROM and function.    Time 4    Period Weeks    Status New    Target Date 08/30/20      PT LONG TERM GOAL #2   Title Pt will be independent in left  breast MLD    Time 3    Period Weeks    Status New    Target Date 08/23/20      PT LONG TERM GOAL #3   Title Pt will report decreased breast heaviness by 50% or greater    Time 4    Period Weeks    Status New      PT LONG TERM GOAL #4   Title Pt will be independent with scar massage    Time 2    Period  Weeks    Status New    Target Date 08/16/20                 Plan - 08/09/20 1522    Clinical Impression Statement Pt received her compression bra today after trying on several others that she didn't care for.  The longer line bra came too high into the chest region and the other was not high enough under her axilla.  One of them made her feel like she was on the SWAT team.  She settled for one with velcro straps and it was a good fit, but not as compressive as the others.  She is comfortable in it and that is a good start for her.  She is still feeling axilllary and medial arm tightness but after MFR techniques, PROM and exercising she felt much better.  She did well with theraband exercises with only a few VC's and TC's required to correct,. Pt will try sleeping without her bra tonight and will check left breast in the am.    Personal Factors and Comorbidities Comorbidity 2    Comorbidities Hx of bilateral breast CA with new onset of left Breast CA. ( right in 2019), lymphedema risk    Stability/Clinical Decision Making Stable/Uncomplicated    Rehab Potential Excellent    PT Frequency 2x / week    PT Duration 4 weeks    PT Treatment/Interventions ADLs/Self Care Home Management;Therapeutic exercise;Patient/family education;Manual techniques;Manual lymph drainage;Scar mobilization;Passive range of motion    PT Next Visit Plan review self MLD to left Inguinal only, PROM left shoulder prn, MFR axilla and medial arm,  scar massage.  Start theraband, check  and see how breast was inam without wearing bra to bed    PT Home Exercise Plan 4 post op exercises, ABC class, Supine scapular  stabs x 5 with yellow Theraband    Recommended Other Services left UE sleeve    Consulted and Agree with Plan of Care Patient           Patient will benefit from skilled therapeutic intervention in order to improve the following deficits and impairments:  Postural dysfunction,Decreased knowledge of precautions,Decreased scar mobility,Increased edema,Decreased range of motion  Visit Diagnosis: Abnormal posture  Edema, unspecified type  Malignant neoplasm of upper-outer quadrant of left female breast, unspecified estrogen receptor status ([place])     Problem List Patient Active Problem List   Diagnosis Date Noted  . Neuropathy due to chemotherapeutic drug ([place]) 07/23/2018  . Port-A-Cath in place 08/28/2017  . TIA (transient ischemic attack) 08/09/2017  . Hypertension   . Genetic testing 07/06/2017  . Family history of ovarian cancer   . Family history of breast cancer   . Malignant neoplasm of upper-outer quadrant of right breast in female, estrogen receptor negative ([place]) 06/26/2017  . Malignant neoplasm of left breast, estrogen receptor positive ([place]) 05/19/2017    Claris Pong 08/09/2020, 3:29 PM  [place], [place], [address] Phone: [phone]   Fax:  [phone]  Name: April [name] MRN: [id number] Date of Birth: 20-Jun-1950  Cheral Almas, PT 08/09/20 3:32 PM

## 2020-08-09 NOTE — Patient Instructions (Signed)

## 2020-08-11 ENCOUNTER — Ambulatory Visit: Payer: Medicare PPO

## 2020-08-11 ENCOUNTER — Other Ambulatory Visit: Payer: Self-pay

## 2020-08-11 DIAGNOSIS — C50412 Malignant neoplasm of upper-outer quadrant of left female breast: Secondary | ICD-10-CM | POA: Diagnosis not present

## 2020-08-11 DIAGNOSIS — R293 Abnormal posture: Secondary | ICD-10-CM

## 2020-08-11 DIAGNOSIS — R609 Edema, unspecified: Secondary | ICD-10-CM | POA: Diagnosis not present

## 2020-08-11 NOTE — Therapy (Signed)
[place], [place], [address] Phone: [phone]   Fax:  [phone]  Physical Therapy Treatment  Patient Details  Name: April [name] MRN: [id number] Date of Birth: May 02, 1951 Referring Provider (PT): Dr. Donne Hazel   Encounter Date: 08/11/2020   PT End of Session - 08/11/20 1140    Visit Number 5    Number of Visits 11    Date for PT Re-Evaluation 08/30/20    PT Start Time 1102    PT Stop Time 1159    PT Time Calculation (min) 57 min    Activity Tolerance Patient tolerated treatment well    Behavior During Therapy [facility] for tasks assessed/performed           Past Medical History:  Diagnosis Date  . Breast cancer ([place]) 05/2017   right breast  . Eczema   . Family history of breast cancer   . Family history of ovarian cancer   . History of radiation therapy 10/17/17- 11/14/17   40.05 directed to the right breast in 15 fractions, followed by a boost of 10 gy given in 5 fractions.   . Hypertension    toxemia during pregnancy, no meds now    Past Surgical History:  Procedure Laterality Date  . ABDOMINAL HYSTERECTOMY    . BREAST LUMPECTOMY WITH RADIOACTIVE SEED AND SENTINEL LYMPH NODE BIOPSY Right 07/17/2017   Procedure: BREAST LUMPECTOMY WITH RADIOACTIVE SEED AND SENTINEL LYMPH NODE BIOPSY;  Surgeon: Rolm Bookbinder, MD;  Location: [place];  Service: General;  Laterality: Right;  . BREAST LUMPECTOMY WITH RADIOACTIVE SEED AND SENTINEL LYMPH NODE BIOPSY Left 07/13/2020   Procedure: LEFT BREAST LUMPECTOMY WITH RADIOACTIVE SEED AND LEFT AXILLARY SENTINEL LYMPH NODE BIOPSY;  Surgeon: Rolm Bookbinder, MD;  Location: [place];  Service: General;  Laterality: Left;  . CESAREAN SECTION     x4  . DILATION AND CURETTAGE OF UTERUS    . KNEE SURGERY Right   . PORT-A-CATH REMOVAL Right 09/04/2018   Procedure: REMOVAL PORT-A-CATH;  Surgeon: Rolm Bookbinder, MD;   Location: [place];  Service: General;  Laterality: Right;  . PORTACATH PLACEMENT N/A 07/17/2017   Procedure: INSERTION PORT-A-CATH WITH [place];  Surgeon: Rolm Bookbinder, MD;  Location: [place];  Service: General;  Laterality: N/A;  . PORTACATH PLACEMENT Right 07/13/2020   Procedure: INSERTION PORT-A-CATH WITH ULTRASOUND GUIDANCE;  Surgeon: Rolm Bookbinder, MD;  Location: [place];  Service: General;  Laterality: Right;    There were no vitals filed for this visit.   Subjective Assessment - 08/11/20 1101    Subjective I have on my compression bra and it feels good.  Didn't sleep in bra and breast still feels a little swollen.  I walked the wall this am and thats all I have done.    Pertinent History Pt with prior history of right breast cancer  with  with surgery on 07/17/17 for right lumpectomy and SLNB with 5 LN removed.  Had chemo and radiation. Most recent surgery1/25/22 for left breast lumpectomy with 0/5 positive nodes. She will have chemo, radiation and infusions for immunotherapy.    Patient Stated Goals Want to decrease tightness in axilla and knot in incision region., decrease swelling in left breast    Currently in Pain? No/denies    Pain Score 0-No pain  [facility] Adult PT Treatment/Exercise - 08/11/20 0001      Shoulder Exercises: Supine   Horizontal ABduction Both;Strengthening   7 reps   Theraband Level (Shoulder Horizontal ABduction) Level 1 (Yellow)    External Rotation Strengthening;Both   7 reps   Theraband Level (Shoulder External Rotation) Level 1 (Yellow)    Flexion Strengthening;Both   7 reps   Theraband Level (Shoulder Flexion) Level 1 (Yellow)    Diagonals Strengthening;Right;Left   7 reps   Theraband Level (Shoulder Diagonals) Level 1 (Yellow)    Other Supine Exercises AA flex and scaption x 2 ea    Other Supine Exercises Supine AROM flex, scaption and horizontal abd x  5 in hooklying.      Manual Therapy   Manual therapy comments chip pack made for LN incision    Edema Management Pt was instructed in self breast MLD in supine as performed by therapist with  VC's and TC's for stretch and technique.    Soft tissue mobilization gentle scar massage to incision sites    Myofascial Release MFR left axillary region and medial upper arm to areas of multiple cords   Manual Lymphatic Drainage (MLD) Therapist performed supraclavicular, left axillary LN, left axillo-inguinal pathway, and left lateral breast/trunk in supine and left lateral upper arm, medial to lateral and lateral arm again  retracing all steps and ending with LN's    Passive ROM PROM left shoulder in all directions                       PT Long Term Goals - 08/02/20 1803      PT LONG TERM GOAL #1   Title Patient will demonstrate she has returned to baseline since surgery related to shoulder ROM and function.    Time 4    Period Weeks    Status New    Target Date 08/30/20      PT LONG TERM GOAL #2   Title Pt will be independent in left breast MLD    Time 3    Period Weeks    Status New    Target Date 08/23/20      PT LONG TERM GOAL #3   Title Pt will report decreased breast heaviness by 50% or greater    Time 4    Period Weeks    Status New      PT LONG TERM GOAL #4   Title Pt will be independent with scar massage    Time 2    Period Weeks    Status New    Target Date 08/16/20                 Plan - 08/11/20 1141    Clinical Impression Statement Pt continues to have multiple cords noticed mostly with end range flexion and scaption starting at various places in axilla and radiating to elbow.  PROM is progressing nicely but is still limited somewhat by cording.  She is improving with self MLD but requires occasional VC's and TC's for proper stretch and direction of stretch.  She used very good form with supine scapular series and was able to progress reps to 7 for  all. We tried a chip pack in her bra today to try and push the remainder of swelling out at lymphnode incision    Personal Factors and Comorbidities Comorbidity 2    Comorbidities Hx of bilateral breast CA with new onset of left Breast CA. ( right in  2019), lymphedema risk    Stability/Clinical Decision Making Stable/Uncomplicated    Rehab Potential Excellent    PT Frequency 2x / week    PT Duration 4 weeks    PT Treatment/Interventions ADLs/Self Care Home Management;Therapeutic exercise;Patient/family education;Manual techniques;Manual lymph drainage;Scar mobilization;Passive range of motion    PT Next Visit Plan MFR to axillary region and multiple cords especially with flex and scaption, scar massage.  check benefit of chip pack in bra, add standing SR and ext with TB, Review MLD prn    PT Home Exercise Plan 4 post op exercises, ABC class, Supine scapular stabs x 5 with yellow Theraband    Recommended Other Services Measure by Lenna Sciara to use Alight for sleeve/bra    Consulted and Agree with Plan of Care Patient           Patient will benefit from skilled therapeutic intervention in order to improve the following deficits and impairments:  Postural dysfunction,Decreased knowledge of precautions,Decreased scar mobility,Increased edema,Decreased range of motion  Visit Diagnosis: Abnormal posture  Edema, unspecified type  Malignant neoplasm of upper-outer quadrant of left female breast, unspecified estrogen receptor status ([place])     Problem List Patient Active Problem List   Diagnosis Date Noted  . Neuropathy due to chemotherapeutic drug ([place]) 07/23/2018  . Port-A-Cath in place 08/28/2017  . TIA (transient ischemic attack) 08/09/2017  . Hypertension   . Genetic testing 07/06/2017  . Family history of ovarian cancer   . Family history of breast cancer   . Malignant neoplasm of upper-outer quadrant of right breast in female, estrogen receptor negative ([place]) 06/26/2017  . Malignant  neoplasm of left breast, estrogen receptor positive ([place]) 05/19/2017    Claris Pong 08/11/2020, 12:07 PM  [facility] [place], [place], [address] Phone: [phone]   Fax:  [phone]  Name: April [name] MRN: [id number] Date of Birth: [birth year] Cheral Almas, PT 08/11/20 12:09 PM

## 2020-08-13 ENCOUNTER — Other Ambulatory Visit: Payer: Self-pay | Admitting: *Deleted

## 2020-08-13 ENCOUNTER — Ambulatory Visit (HOSPITAL_COMMUNITY): Payer: Medicare PPO | Attending: Internal Medicine

## 2020-08-13 ENCOUNTER — Other Ambulatory Visit: Payer: Self-pay

## 2020-08-13 DIAGNOSIS — Z0189 Encounter for other specified special examinations: Secondary | ICD-10-CM | POA: Diagnosis not present

## 2020-08-13 DIAGNOSIS — Z171 Estrogen receptor negative status [ER-]: Secondary | ICD-10-CM | POA: Diagnosis not present

## 2020-08-13 DIAGNOSIS — I119 Hypertensive heart disease without heart failure: Secondary | ICD-10-CM | POA: Insufficient documentation

## 2020-08-13 DIAGNOSIS — C50411 Malignant neoplasm of upper-outer quadrant of right female breast: Secondary | ICD-10-CM | POA: Diagnosis not present

## 2020-08-13 DIAGNOSIS — Z01818 Encounter for other preprocedural examination: Secondary | ICD-10-CM | POA: Diagnosis not present

## 2020-08-13 DIAGNOSIS — Z8673 Personal history of transient ischemic attack (TIA), and cerebral infarction without residual deficits: Secondary | ICD-10-CM | POA: Insufficient documentation

## 2020-08-14 LAB — ECHOCARDIOGRAM COMPLETE
Area-P 1/2: 3.78 cm2
S' Lateral: 2.1 cm

## 2020-08-16 ENCOUNTER — Ambulatory Visit: Payer: Medicare PPO

## 2020-08-16 ENCOUNTER — Other Ambulatory Visit: Payer: Self-pay

## 2020-08-16 DIAGNOSIS — C50412 Malignant neoplasm of upper-outer quadrant of left female breast: Secondary | ICD-10-CM | POA: Diagnosis not present

## 2020-08-16 DIAGNOSIS — R293 Abnormal posture: Secondary | ICD-10-CM | POA: Diagnosis not present

## 2020-08-16 DIAGNOSIS — R609 Edema, unspecified: Secondary | ICD-10-CM | POA: Diagnosis not present

## 2020-08-16 NOTE — Patient Instructions (Signed)
Access Code: [email or web address] URL: [email or web address] Date: 08/16/2020 Prepared by: Cheral Almas  Exercises Scapular Retraction with Resistance - 1 x daily - 3 x weekly - 1 sets - 10 reps Scapular Retraction with Resistance Advanced - 1 x daily - 3 x weekly - 1 sets - 10 reps Shoulder External Rotation and Scapular Retraction with Resistance - 1 x daily - 3 x weekly - 1 sets - 10 reps

## 2020-08-16 NOTE — Therapy (Signed)
[place], [place], [address] Phone: [phone]   Fax:  [phone]  Physical Therapy Treatment  Patient Details  Name: April [name] MRN: [id number] Date of Birth: [birth year] Referring Provider (PT): Dr. Donne Hazel   Encounter Date: 08/16/2020   PT End of Session - 08/16/20 1344    Visit Number 6    Number of Visits 11    Date for PT Re-Evaluation 08/30/20    PT Start Time 1302    PT Stop Time 5638    PT Time Calculation (min) 53 min    Activity Tolerance Patient tolerated treatment well    Behavior During Therapy [facility] for tasks assessed/performed           Past Medical History:  Diagnosis Date  . Breast cancer ([place]) 05/2017   right breast  . Eczema   . Family history of breast cancer   . Family history of ovarian cancer   . History of radiation therapy 10/17/17- 11/14/17   40.05 directed to the right breast in 15 fractions, followed by a boost of 10 gy given in 5 fractions.   . Hypertension    toxemia during pregnancy, no meds now    Past Surgical History:  Procedure Laterality Date  . ABDOMINAL HYSTERECTOMY    . BREAST LUMPECTOMY WITH RADIOACTIVE SEED AND SENTINEL LYMPH NODE BIOPSY Right 07/17/2017   Procedure: BREAST LUMPECTOMY WITH RADIOACTIVE SEED AND SENTINEL LYMPH NODE BIOPSY;  Surgeon: Rolm Bookbinder, MD;  Location: [place];  Service: General;  Laterality: Right;  . BREAST LUMPECTOMY WITH RADIOACTIVE SEED AND SENTINEL LYMPH NODE BIOPSY Left 07/13/2020   Procedure: LEFT BREAST LUMPECTOMY WITH RADIOACTIVE SEED AND LEFT AXILLARY SENTINEL LYMPH NODE BIOPSY;  Surgeon: Rolm Bookbinder, MD;  Location: [place];  Service: General;  Laterality: Left;  . CESAREAN SECTION     x4  . DILATION AND CURETTAGE OF UTERUS    . KNEE SURGERY Right   . PORT-A-CATH REMOVAL Right 09/04/2018   Procedure: REMOVAL PORT-A-CATH;  Surgeon: Rolm Bookbinder, MD;   Location: [place];  Service: General;  Laterality: Right;  . PORTACATH PLACEMENT N/A 07/17/2017   Procedure: INSERTION PORT-A-CATH WITH [place];  Surgeon: Rolm Bookbinder, MD;  Location: [place];  Service: General;  Laterality: N/A;  . PORTACATH PLACEMENT Right 07/13/2020   Procedure: INSERTION PORT-A-CATH WITH ULTRASOUND GUIDANCE;  Surgeon: Rolm Bookbinder, MD;  Location: [place];  Service: General;  Laterality: Right;    There were no vitals filed for this visit.   Subjective Assessment - 08/16/20 1302    Subjective Have some soreness in left chest and under armpit, but I have been working on ROM and its doing better.  I can see the cord but I don't feel it as much.  The incison flattened out with the chip pack.  Wore the chip pack for 3-4 days. Tried the breast MLD over the weekend and it went OK, but I get a little confused.  Breast swelling is improving.    Pertinent History Pt with prior history of right breast cancer  with  with surgery on 07/17/17 for right lumpectomy and SLNB with 5 LN removed.  Had chemo and radiation. Most recent surgery1/25/22 for left breast lumpectomy with 0/5 positive nodes. She will have chemo, radiation and infusions for immunotherapy.    Patient Stated Goals Want to decrease tightness in axilla and knot in incision region., decrease swelling in left breast  Currently in Pain? Yes    Pain Score 2     Pain Location Axilla    Pain Orientation Left    Pain Descriptors / Indicators Tender;Sore    Pain Type Surgical pain    Pain Onset 1 to 4 weeks ago    Multiple Pain Sites No                             [facility] Adult PT Treatment/Exercise - 08/16/20 0001      Shoulder Exercises: Supine   Horizontal ABduction Both;Strengthening;10 reps    Theraband Level (Shoulder Horizontal ABduction) Level 1 (Yellow)    External Rotation Strengthening;Both;10 reps    Theraband Level (Shoulder External  Rotation) Level 1 (Yellow)    Flexion Strengthening;Both;10 reps    Theraband Level (Shoulder Flexion) Level 1 (Yellow)    Diagonals Strengthening;Right;Left;10 reps    Theraband Level (Shoulder Diagonals) Level 1 (Yellow)      Shoulder Exercises: Standing   External Rotation Strengthening;Both;10 reps    Theraband Level (Shoulder External Rotation) Level 1 (Yellow)    Extension Both;Strengthening;10 reps    Theraband Level (Shoulder Extension) Level 1 (Yellow)    Retraction Both;5 reps    Theraband Level (Shoulder Retraction) Level 1 (Yellow)      Manual Therapy   Edema Management Pt was instructed in self breast MLD in supine as performed by therapist with  VC's and TC's for stretch and technique.    Soft tissue mobilization gentle scar massage to incision sites    Myofascial Release MFR left axillary region and medial upper arm    Manual Lymphatic Drainage (MLD) Therapist performed supraclavicular, left axillary LN, left axillo-inguinal pathway, and left lateral breast/trunk in supine and left lateral upper arm, medial to lateral and lateral arm again  retracing all steps and ending with LN's    Passive ROM PROM left shoulder in all directions                  PT Education - 08/16/20 1358    Education Details Pt was educated in standing theraband for Scapular retraction, extension and bilateral ER.    Person(s) Educated Patient    Methods Explanation    Comprehension Verbalized understanding               PT Long Term Goals - 08/02/20 1803      PT LONG TERM GOAL #1   Title Patient will demonstrate she has returned to baseline since surgery related to shoulder ROM and function.    Time 4    Period Weeks    Status New    Target Date 08/30/20      PT LONG TERM GOAL #2   Title Pt will be independent in left breast MLD    Time 3    Period Weeks    Status New    Target Date 08/23/20      PT LONG TERM GOAL #3   Title Pt will report decreased breast heaviness  by 50% or greater    Time 4    Period Weeks    Status New      PT LONG TERM GOAL #4   Title Pt will be independent with scar massage    Time 2    Period Weeks    Status New    Target Date 08/16/20  Plan - 08/16/20 1345    Clinical Impression Statement Therapy consisted of MFR techniques to left axilla and areas of cording, PROM, MLD and review of Self MLD.  Pt with improved PROM and much less tightness felt with her cording, although it is still present with end ranges of scaption and lfexion.  Chip pack in bra has helped incision flatten out and both incisions have decreased firmness.  She used improved technique with self breast MLD today, but required VC's for proper LN activation    Personal Factors and Comorbidities Comorbidity 2    Comorbidities Hx of bilateral breast CA with new onset of left Breast CA. ( right in 2019), lymphedema risk    Stability/Clinical Decision Making Stable/Uncomplicated    Clinical Decision Making Low    Rehab Potential Excellent    PT Frequency 2x / week    PT Duration 4 weeks    PT Treatment/Interventions ADLs/Self Care Home Management;Therapeutic exercise;Patient/family education;Manual techniques;Manual lymph drainage;Scar mobilization;Passive range of motion    PT Next Visit Plan MFR to axillary region and multiple cords especially with flex and scaption, scar massage.  check benefit of chip pack in bra, add standing SR and ext with TB, Review MLD prn    PT Home Exercise Plan 4 post op exercises, ABC class, Supine scapular stabs x 5 with yellow Theraband           Patient will benefit from skilled therapeutic intervention in order to improve the following deficits and impairments:  Postural dysfunction,Decreased knowledge of precautions,Decreased scar mobility,Increased edema,Decreased range of motion,Decreased strength  Visit Diagnosis: Abnormal posture  Edema, unspecified type  Malignant neoplasm of upper-outer  quadrant of left female breast, unspecified estrogen receptor status ([place])     Problem List Patient Active Problem List   Diagnosis Date Noted  . Neuropathy due to chemotherapeutic drug ([place]) 07/23/2018  . Port-A-Cath in place 08/28/2017  . TIA (transient ischemic attack) 08/09/2017  . Hypertension   . Genetic testing 07/06/2017  . Family history of ovarian cancer   . Family history of breast cancer   . Malignant neoplasm of upper-outer quadrant of right breast in female, estrogen receptor negative ([place]) 06/26/2017  . Malignant neoplasm of left breast, estrogen receptor positive ([place]) 05/19/2017    Claris Pong 08/16/2020, 2:00 PM  [place], [place], [address] Phone: [phone]   Fax:  [phone]  Name: KARESS HARNER MRN: [id number] Date of Birth: May 16, 1951 Cheral Almas, PT 08/16/20 2:01 PM

## 2020-08-16 NOTE — Progress Notes (Signed)
[place]  [phone] [phone]     ID: April [name] DOB: Oct 10, 1950  MR#: [id number]  WYO#:[id number]  Patient Care Team: Kelton Pillar, MD as PCP - General (Family Medicine) Dewayne Jurek, Virgie Dad, MD as Consulting Physician (Oncology) Rolm Bookbinder, MD as Consulting Physician (General Surgery) Eppie Gibson, MD as Attending Physician (Radiation Oncology) Newt Minion, MD as Consulting Physician (Orthopedic Surgery) Regal, Tamala Fothergill, DPM as Consulting Physician (Podiatry) Neldon Mc, Donnamarie Poag, MD as Consulting Physician (Allergy and Immunology) Mauro Kaufmann, RN as Oncology Nurse Navigator Rockwell Germany, RN as Oncology Nurse Navigator Bensimhon, Shaune Pascal, MD as Consulting Physician (Cardiology) OTHER MD:  CHIEF COMPLAINT: Estrogen receptor negative breast cancer  CURRENT TREATMENT: Adjuvant chemo immunotherapy   INTERVAL HISTORY: April returns today for follow up and treatment of her new left breast cancer.  Her husband was not able to accompany her because he was seeing his renal transplant doctor today  She began adjuvant CMF therapy on 07/27/2020.  Today is day 1 cycle 2.  She did generally quite well with her treatment, with no unusual side effects other than mild constipation.  We have discussed how she can deal with at this time  Since her last visit, she underwent echocardiogram on 08/13/2020 showing an ejection fraction of 65-70%.   REVIEW OF SYSTEMS: Kanyah continues to have a normal functional status, does her regular walking and exercises, and all her housework.  A detailed review of systems today was otherwise noncontributory   COVID 19 VACCINATION STATUS:    LEFT BREAST CANCER HISTORY: From the original intake note:  She underwent bilateral diagnostic mammography with tomography at [facility] on 06/15/2020 showing: breast density category C; this showed in the right breast evidence of postsurgical and radiation  changes but this was all stable.  On the left however there was a new group of pleomorphic calcifications in the upper inner quadrant..  She proceeded to biopsy of the left breast area in question on 06/15/2020. Pathology from the procedure ([id number]) showed: invasive ductal carcinoma, grade 3; high-grade ductal carcinoma in situ with necrosis; lymphovascular invasion identified. Prognostic indicators significant for: estrogen receptor 40% positive with weak staining intensity; progesterone receptor 0% negative. Proliferation marker Ki67 of 45%. Her2 positive by immunohistochemistry (3+).   RIGHT BREAST CANCER HISTORY: From the original intake note:  "April [name]" had bilateral screening mammography at [facility] 06/06/2017.  This showed a possible mass in the right breast at the 11 o'clock position.  On 06/13/2017 she underwent right diagnostic mammography and ultrasonography.  Breast density was category C.  In the right breast at the 11 o'clock position there was a 1 cm area by mammography.  By ultrasound this confirmed a 1.0 cm irregular mass with lobulated margins in the upper outer quadrant of the right breast.  There was a second, 0.4 cm lobulated mass in the same quadrant.  The right axilla was sonographically benign.  On 06/20/2017 biopsy of the 2 right breast masses in question was performed.  The final pathology ([id number]) found the smaller mass to be only fibrocystic change.  This is felt to be concordant.  The larger mass however was an invasive ductal carcinoma, grade 3, estrogen and progesterone receptor negative, with an MIB-1 of 30%, and HER-2 amplified, with a signals ratio of 2.24.  The number per cell was 4.60.  The patient's subsequent history is as detailed below.   PAST MEDICAL HISTORY: Past Medical History:  Diagnosis Date  . Breast cancer ([place])  05/2017   right breast  . Eczema   . Family history of breast cancer   . Family history of ovarian cancer   . History of radiation  therapy 10/17/17- 11/14/17   40.05 directed to the right breast in 15 fractions, followed by a boost of 10 gy given in 5 fractions.   . Hypertension    toxemia during pregnancy, no meds now    PAST SURGICAL HISTORY: Past Surgical History:  Procedure Laterality Date  . ABDOMINAL HYSTERECTOMY    . BREAST LUMPECTOMY WITH RADIOACTIVE SEED AND SENTINEL LYMPH NODE BIOPSY Right 07/17/2017   Procedure: BREAST LUMPECTOMY WITH RADIOACTIVE SEED AND SENTINEL LYMPH NODE BIOPSY;  Surgeon: Rolm Bookbinder, MD;  Location: [place];  Service: General;  Laterality: Right;  . BREAST LUMPECTOMY WITH RADIOACTIVE SEED AND SENTINEL LYMPH NODE BIOPSY Left 07/13/2020   Procedure: LEFT BREAST LUMPECTOMY WITH RADIOACTIVE SEED AND LEFT AXILLARY SENTINEL LYMPH NODE BIOPSY;  Surgeon: Rolm Bookbinder, MD;  Location: [place];  Service: General;  Laterality: Left;  . CESAREAN SECTION     x4  . DILATION AND CURETTAGE OF UTERUS    . KNEE SURGERY Right   . PORT-A-CATH REMOVAL Right 09/04/2018   Procedure: REMOVAL PORT-A-CATH;  Surgeon: Rolm Bookbinder, MD;  Location: [place];  Service: General;  Laterality: Right;  . PORTACATH PLACEMENT N/A 07/17/2017   Procedure: INSERTION PORT-A-CATH WITH [place];  Surgeon: Rolm Bookbinder, MD;  Location: [place];  Service: General;  Laterality: N/A;  . PORTACATH PLACEMENT Right 07/13/2020   Procedure: INSERTION PORT-A-CATH WITH ULTRASOUND GUIDANCE;  Surgeon: Rolm Bookbinder, MD;  Location: [place];  Service: General;  Laterality: Right;    FAMILY HISTORY Family History  Problem Relation Age of Onset  . Breast cancer Mother 46       again at 73 in other breast   . Heart attack Father 82  . Breast cancer Sister 52  . Breast cancer Maternal Grandmother 73       spread to lungs, died at 64  . Ovarian cancer Cousin 59  . Prostate cancer Cousin   The patient's father died at age 18 from a heart  attack.  The patient's mother is currently living at age 81 (as of January 2019).  The patient had 1 sister who was diagnosed with breast cancer at age 73 and died from metastatic disease at age 48.  The patient has 1 brother.  In addition the patient's mother was diagnosed with breast cancer at age 22, on the left side, and now has a right-sided breast cancer diagnosed in January 2019.  There is in addition a cousin with ovarian cancer diagnosed when she was 71 years old   GYNECOLOGIC HISTORY:  No LMP recorded. Patient has had a hysterectomy. Menarche age 62, first live birth age 62, the patient had 3 live births, 1 of whom survived only 2 days.  She underwent hysterectomy without salpingo-oophorectomy September 05, 1978.  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  Her husband [name] his Theme park manager at [place].  The patient's daughter [name] lives in [place] where she is an Tourist information centre manager.  The patient's daughter [name] lives in [place] working for the department of defense.  The patient has 3 grandchildren, 1 of whom is in the  seventh grade but is actually the captain of the eighth grade basketball team and is playing in a championship February 2020.   ADVANCED DIRECTIVES: Not in place   HEALTH MAINTENANCE: Social History   Tobacco Use  . Smoking status: Never Smoker  . Smokeless tobacco: Never Used  Vaping Use  . Vaping Use: Never used  Substance Use Topics  . Alcohol use: Yes    Comment: social  . Drug use: No     Colonoscopy: April 2018/[facility]  PAP: Status post hysterectomy  Bone density:   Allergies  Allergen Reactions  . Tape Itching and Rash  . Nsaids     Stomach issue  . Sulfa Antibiotics Rash    Current Outpatient Medications  Medication Sig Dispense Refill  . CALCIUM-VITAMIN D  PO Take 1 tablet by mouth daily.    Marland Kitchen co-enzyme Q-10 30 MG capsule Take 30 mg by mouth daily.    . hydrochlorothiazide (MICROZIDE) 12.5 MG capsule TAKE 1 CAPSULE BY MOUTH EVERY DAY 90 capsule 3  . lidocaine-prilocaine (EMLA) cream Apply 1 application topically as needed. 30 g 0  . lidocaine-prilocaine (EMLA) cream Apply to affected area once 30 g 3  . Omega-3 Fatty Acids (FISH OIL) 1000 MG CAPS Take 1 capsule by mouth daily.    . prochlorperazine (COMPAZINE) 10 MG tablet Take 1 tablet (10 mg total) by mouth every 6 (six) hours as needed (Nausea or vomiting). 30 tablet 1  . Red Yeast Rice Extract (RED YEAST RICE PO) Take by mouth.    . traMADol (ULTRAM) 50 MG tablet Take 1 tablet (50 mg total) by mouth every 6 (six) hours as needed. 10 tablet 0  . vitamin B-12 (CYANOCOBALAMIN) 1000 MCG tablet Take 1,000 mcg by mouth daily.    . vitamin C (ASCORBIC ACID) 250 MG tablet Take 250 mg by mouth daily.     No current facility-administered medications for this visit.    OBJECTIVE: African-American woman in no acute distress Vitals:   08/17/20 1247  BP: (!) 160/77  Pulse: 79  Resp: 18  Temp: (!) 97.2 F (36.2 C)  SpO2: 100%     Body mass index is 26.12 kg/m.    Wt Readings from Last 3 Encounters:  08/17/20 149 lb 12.8 oz (67.9 kg)  08/03/20 151 lb (68.5 kg)  07/26/20 150 lb 9.6 oz (68.3 kg)  ECOG FS:1 - Symptomatic but completely ambulatory   Sclerae unicteric, EOMs intact Wearing a mask No cervical or supraclavicular adenopathy Lungs no rales or rhonchi Heart regular rate and rhythm Abd soft, nontender, positive bowel sounds MSK no focal spinal tenderness, no upper extremity lymphedema Neuro: nonfocal, well oriented, appropriate affect Breasts: The right breast has undergone remote lumpectomy and radiation.  There is no evidence of local recurrence.  The left breast is status post more recent lumpectomy.  There is no evidence of local residual recurrent disease.  Both axillae are  benign   LAB RESULTS:  CMP     Component Value Date/Time   NA 140 08/17/2020 1234   K 3.8 08/17/2020 1234   CL 104 08/17/2020 1234   CO2 26 08/17/2020 1234   GLUCOSE 101 (H) 08/17/2020 1234   BUN 12 08/17/2020 1234   CREATININE 1.05 (H) 08/17/2020 1234   CALCIUM 9.5 08/17/2020 1234   PROT 7.5 08/17/2020 1234   ALBUMIN 4.0 08/17/2020 1234   AST 31 08/17/2020 1234   ALT 35 08/17/2020 1234   ALKPHOS 71 08/17/2020 1234   BILITOT 0.3 08/17/2020  1234   GFRNONAA 57 (L) 08/17/2020 1234   GFRAA >60 07/10/2019 1008   GFRAA >60 06/27/2017 0843    No results found for: TOTALPROTELP, ALBUMINELP, A1GS, A2GS, BETS, BETA2SER, GAMS, MSPIKE, SPEI  No results found for: KPAFRELGTCHN, LAMBDASER, [facility]  Lab Results  Component Value Date   WBC 3.7 (L) 08/17/2020   NEUTROABS 1.1 (L) 08/17/2020   HGB 10.8 (L) 08/17/2020   HCT 32.2 (L) 08/17/2020   MCV 88.0 08/17/2020   PLT 221 08/17/2020   No results found for: LABCA2  No components found for: [id number]  No results for input(s): INR in the last 168 hours.  No results found for: LABCA2  No results found for: GUY403  No results found for: KVQ259  No results found for: DGL875  No results found for: CA2729  No components found for: HGQUANT  No results found for: CEA1 / No results found for: CEA1   No results found for: AFPTUMOR  No results found for: CHROMOGRNA  No results found for: HGBA, HGBA2QUANT, HGBFQUANT, HGBSQUAN (Hemoglobinopathy evaluation)   No results found for: LDH  Lab Results  Component Value Date   IRON 77 05/21/2018   TIBC 404 05/21/2018   IRONPCTSAT 19 (L) 05/21/2018   (Iron and TIBC)  Lab Results  Component Value Date   FERRITIN 24 05/21/2018    Urinalysis    Component Value Date/Time   COLORURINE COLORLESS (A) 08/09/2017 2224   APPEARANCEUR CLEAR 08/09/2017 2224   LABSPEC 1.004 (L) 08/09/2017 2224   PHURINE 6.0 08/09/2017 2224   GLUCOSEU NEGATIVE 08/09/2017 2224   HGBUR NEGATIVE  08/09/2017 2224   BILIRUBINUR NEGATIVE 08/09/2017 2224   KETONESUR NEGATIVE 08/09/2017 2224   PROTEINUR NEGATIVE 08/09/2017 2224   NITRITE NEGATIVE 08/09/2017 2224   LEUKOCYTESUR NEGATIVE 08/09/2017 2224    STUDIES: ECHOCARDIOGRAM COMPLETE  Result Date: 08/14/2020    ECHOCARDIOGRAM REPORT   Patient Name:   MARIELA REX Date of Exam: 08/13/2020 Medical Rec #:  [id number]           Height:       63.5 in Accession #:    [id number]          Weight:       151.0 lb Date of Birth:  Feb 16, 1951           BSA:          1.726 m Patient Age:    33 years            BP:           161/81 mmHg Patient Gender: F                   HR:           95 bpm. Exam Location:  [facility] Procedure: 2D Echo, Cardiac Doppler and Color Doppler Indications:    Z09 Chemotherapy  History:        Patient has prior history of Echocardiogram examinations, most                 recent 05/28/2018. TIA; Risk Factors:Hypertension. Breast                 cancer.  Sonographer:    Diamond Nickel RCS Referring Phys: [place] Comments: Attempted 3D. IMPRESSIONS  1. Left ventricular ejection fraction, by estimation, is 65 to 70%. The left ventricle has normal function. The left ventricle has no regional wall motion abnormalities. There is mild  asymmetric left ventricular hypertrophy of the septal segment. Left ventricular diastolic parameters are consistent with Grade I diastolic dysfunction (impaired relaxation). The average left ventricular global longitudinal strain is -19.3 %. The global longitudinal strain is normal.  2. Right ventricular systolic function is normal. The right ventricular size is normal. Tricuspid regurgitation signal is inadequate for assessing PA pressure.  3. The mitral valve is normal in structure. Trivial mitral valve regurgitation. No evidence of mitral stenosis.  4. The aortic valve is grossly normal. Aortic valve regurgitation is not visualized. No aortic stenosis is present.  5. The  inferior vena cava is normal in size with greater than 50% respiratory variability, suggesting right atrial pressure of 3 mmHg. FINDINGS  Left Ventricle: Left ventricular ejection fraction, by estimation, is 65 to 70%. The left ventricle has normal function. The left ventricle has no regional wall motion abnormalities. The average left ventricular global longitudinal strain is -19.3 %. The global longitudinal strain is normal. The left ventricular internal cavity size was normal in size. There is mild asymmetric left ventricular hypertrophy of the septal segment. Left ventricular diastolic parameters are consistent with Grade I diastolic dysfunction (impaired relaxation). Right Ventricle: The right ventricular size is normal. No increase in right ventricular wall thickness. Right ventricular systolic function is normal. Tricuspid regurgitation signal is inadequate for assessing PA pressure. Left Atrium: Left atrial size was normal in size. Right Atrium: Right atrial size was normal in size. Pericardium: There is no evidence of pericardial effusion. Mitral Valve: The mitral valve is normal in structure. Trivial mitral valve regurgitation. No evidence of mitral valve stenosis. Tricuspid Valve: The tricuspid valve is normal in structure. Tricuspid valve regurgitation is not demonstrated. No evidence of tricuspid stenosis. Aortic Valve: The aortic valve is grossly normal. Aortic valve regurgitation is not visualized. No aortic stenosis is present. Pulmonic Valve: The pulmonic valve was normal in structure. Pulmonic valve regurgitation is not visualized. No evidence of pulmonic stenosis. Aorta: The aortic root is normal in size and structure. Venous: The inferior vena cava is normal in size with greater than 50% respiratory variability, suggesting right atrial pressure of 3 mmHg. IAS/Shunts: No atrial level shunt detected by color flow Doppler.  LEFT VENTRICLE PLAX 2D LVIDd:         3.90 cm  Diastology LVIDs:          2.10 cm  LV e' medial:    5.18 cm/s LV PW:         1.00 cm  LV E/e' medial:  12.3 LV IVS:        1.10 cm  LV e' lateral:   8.63 cm/s LVOT diam:     1.90 cm  LV E/e' lateral: 7.4 LV SV:         63 LV SV Index:   36       2D Longitudinal Strain LVOT Area:     2.84 cm 2D Strain GLS Avg:     -19.3 %  RIGHT VENTRICLE RV Basal diam:  2.20 cm RV S prime:     10.40 cm/s TAPSE (M-mode): 2.2 cm LEFT ATRIUM             Index       RIGHT ATRIUM          Index LA diam:        3.50 cm 2.03 cm/m  RA Area:     7.27 cm LA Vol (A2C):   38.7 ml 22.42 ml/m RA Volume:   11.00  ml 6.37 ml/m LA Vol (A4C):   29.2 ml 16.92 ml/m LA Biplane Vol: 34.2 ml 19.81 ml/m  AORTIC VALVE LVOT Vmax:   115.00 cm/s LVOT Vmean:  70.200 cm/s LVOT VTI:    0.221 m  AORTA Ao Root diam: 2.70 cm MITRAL VALVE MV Area (PHT): 3.78 cm    SHUNTS MV Decel Time: 201 msec    Systemic VTI:  0.22 m MV E velocity: 63.70 cm/s  Systemic Diam: 1.90 cm MV A velocity: 70.27 cm/s MV E/A ratio:  0.91 Cherlynn Kaiser MD Electronically signed by Cherlynn Kaiser MD Signature Date/Time: 08/14/2020/1:44:13 PM    Final      ELIGIBLE FOR AVAILABLE RESEARCH PROTOCOL: no   ASSESSMENT: 70 y.o. [place] woman   RIGHT BREAST CANCER (0) status post right breast upper outer quadrant biopsy 06/20/2017 for a clinical T1b N0, stage IA invasive ductal carcinoma, grade 3, estrogen and progesterone receptor negative, but HER-2 amplified, with an MIB-1 of 30%  (1) genetics testing 07/05/2017 through the Common Hereditary Cancer Panel offered by [facility] found no deleterious mutations in APC, ATM, AXIN2, BARD1, BMPR1A, BRCA1, BRCA2, BRIP1, CDH1, CDKN2A (p14ARF), CDKN2A (p16INK4a), CKD4, CHEK2, CTNNA1, DICER1, EPCAM (Deletion/duplication testing only), GREM1 (promoter region deletion/duplication testing only), KIT, MEN1, MLH1, MSH2, MSH3, MSH6, MUTYH, NBN, NF1, NHTL1, PALB2, PDGFRA, PMS2, POLD1, POLE, PTEN, RAD50, RAD51C, RAD51D, SDHB, SDHC, SDHD, SMAD4, SMARCA4.  STK11, TP53, TSC1, TSC2, and VHL.  The following genes were evaluated for sequence changes only: SDHA and HOXB13 c.251G>A variant only.  (a) a Variant of uncertain significance in MSH2 was identified c.1331G>T (p.Arg444Leu).   (2) status post right lumpectomy and sentinel lymph node sampling 07/17/2017 for a pT1c pN0, stage IA invasive ductal carcinoma, grade 2, with negative margins.  A total of 5 lymph nodes were removed  (3) adjuvant chemotherapy consisting of paclitaxel weekly x12 together with trastuzumab every 21 days starting 08/07/2017  (a) paclitaxel stopped after 8 doses because of neuropathy (last dose 09/25/2017  (4) trastuzumab continued to total 1 year (last dose 07/23/2018)  (a) echo 08/10/2017 showed an ejection fraction in the 65-70% range  (b) echo on 11/01/2017 shows EF of 60-65%  (c) cardiogram 02/05/2018 shows an ejection fraction in the 60-65% range.  (d) echocardiogram 05/28/2018 shows an ejection fraction in the 65-70%.  (5) adjuvant radiation 10/17/2017-11/14/2016: 40.05 Gy directed to the Right Breast in 15 fractions, followed by a boost of 10 Gy given in 5 fractions   (6) anemia, with normal MCV, ferritin, B12, folate, and inappropriately normal reticulocyte count, consistent with anemia of chronic illness.  LEFT BREAST CANCER: (7) status post left breast upper inner quadrant biopsy 06/15/2020 for a clinical T1 N0 invasive ductal carcinoma, grade 3, weakly estrogen receptor positive, progesterone receptor negative, HER2 amplified, with an MIB-1 of 45%.  (8) status post left lumpectomy and sentinel lymph node sampling 07/13/2020 for a pT1c pN0, stage IB invasive ductal carcinoma, grade 3, with negative margins  (a) a total of 5 axilla lymph nodes were removed, all benign  (b) repeat prognostic panel again weakly estrogen receptor positive, progesterone receptor negative, now HER-2 not amplified  (9) adjuvant chemotherapy and anti-HER2 immunotherapy to start 07/27/2020,  consisting of CMF chemotherapy to be repeated every 21 days and trastuzumab to be continued for 1 year.  (a) echocardiogram 05/28/2020 shows an ejection fraction in the 65-70% range  (10) trastuzumab to be continued a minimum of 1 year  (11) adjuvant radiation can be given concurrently with CMF  (12)  to start letrozole at the completion of local treatment   PLAN: Tiamarie proceeds with her second cycle of CMF today.  She has generally done quite well.  Her ANC is on the low side and that is something to watch but I am not adding PEG fill gastrium at this point.  I have encouraged her to be as normal as possible but certainly if she develops a temperature above 100 she will let us.  She will see [place] again in 3 weeks and she will see me again specifically in 6 weeks.  I have alerted her radiation oncologist that the patient may undergo radiation and during her CMF treatments at her and the patient's discretion  Total encounter time 25 minutes.*   Paige Vanderwoude, Virgie Dad, MD  08/17/20 1:13 PM Medical Oncology and Hematology [facility] [place], [place] [address] Tel. [phone]    Fax. [phone]   I, Wilburn Mylar, am acting as scribe for Dr. Virgie Dad. April [name].  I, Lurline Del MD, have reviewed the above documentation for accuracy and completeness, and I agree with the above.   *Total Encounter Time as defined by the Centers for Medicare and Medicaid Services includes, in addition to the face-to-face time of a patient visit (documented in the note above) non-face-to-face time: obtaining and reviewing outside history, ordering and reviewing medications, tests or procedures, care coordination (communications with other health care professionals or caregivers) and documentation in the medical record.

## 2020-08-17 ENCOUNTER — Inpatient Hospital Stay: Payer: Medicare PPO | Admitting: Oncology

## 2020-08-17 ENCOUNTER — Inpatient Hospital Stay: Payer: Medicare PPO

## 2020-08-17 ENCOUNTER — Inpatient Hospital Stay: Payer: Medicare PPO | Attending: Oncology

## 2020-08-17 ENCOUNTER — Other Ambulatory Visit: Payer: Self-pay

## 2020-08-17 VITALS — BP 160/77 | HR 79 | Temp 97.2°F | Resp 18 | Ht 63.5 in | Wt 149.8 lb

## 2020-08-17 DIAGNOSIS — Z886 Allergy status to analgesic agent status: Secondary | ICD-10-CM | POA: Diagnosis not present

## 2020-08-17 DIAGNOSIS — Z5111 Encounter for antineoplastic chemotherapy: Secondary | ICD-10-CM | POA: Insufficient documentation

## 2020-08-17 DIAGNOSIS — Z79899 Other long term (current) drug therapy: Secondary | ICD-10-CM | POA: Insufficient documentation

## 2020-08-17 DIAGNOSIS — Z8041 Family history of malignant neoplasm of ovary: Secondary | ICD-10-CM | POA: Insufficient documentation

## 2020-08-17 DIAGNOSIS — C50212 Malignant neoplasm of upper-inner quadrant of left female breast: Secondary | ICD-10-CM | POA: Diagnosis not present

## 2020-08-17 DIAGNOSIS — Z171 Estrogen receptor negative status [ER-]: Secondary | ICD-10-CM

## 2020-08-17 DIAGNOSIS — I10 Essential (primary) hypertension: Secondary | ICD-10-CM | POA: Diagnosis not present

## 2020-08-17 DIAGNOSIS — Z5112 Encounter for antineoplastic immunotherapy: Secondary | ICD-10-CM | POA: Insufficient documentation

## 2020-08-17 DIAGNOSIS — C50411 Malignant neoplasm of upper-outer quadrant of right female breast: Secondary | ICD-10-CM

## 2020-08-17 DIAGNOSIS — Z888 Allergy status to other drugs, medicaments and biological substances status: Secondary | ICD-10-CM | POA: Insufficient documentation

## 2020-08-17 DIAGNOSIS — K5900 Constipation, unspecified: Secondary | ICD-10-CM | POA: Insufficient documentation

## 2020-08-17 DIAGNOSIS — Z803 Family history of malignant neoplasm of breast: Secondary | ICD-10-CM | POA: Insufficient documentation

## 2020-08-17 DIAGNOSIS — Z923 Personal history of irradiation: Secondary | ICD-10-CM | POA: Insufficient documentation

## 2020-08-17 DIAGNOSIS — Z95828 Presence of other vascular implants and grafts: Secondary | ICD-10-CM

## 2020-08-17 DIAGNOSIS — Z882 Allergy status to sulfonamides status: Secondary | ICD-10-CM | POA: Insufficient documentation

## 2020-08-17 LAB — CBC WITH DIFFERENTIAL (CANCER CENTER ONLY)
Abs Immature Granulocytes: 0.02 10*3/uL (ref 0.00–0.07)
Basophils Absolute: 0 10*3/uL (ref 0.0–0.1)
Basophils Relative: 1 %
Eosinophils Absolute: 0.1 10*3/uL (ref 0.0–0.5)
Eosinophils Relative: 2 %
HCT: 32.2 % — ABNORMAL LOW (ref 36.0–46.0)
Hemoglobin: 10.8 g/dL — ABNORMAL LOW (ref 12.0–15.0)
Immature Granulocytes: 1 %
Lymphocytes Relative: 51 %
Lymphs Abs: 1.9 10*3/uL (ref 0.7–4.0)
MCH: 29.5 pg (ref 26.0–34.0)
MCHC: 33.5 g/dL (ref 30.0–36.0)
MCV: 88 fL (ref 80.0–100.0)
Monocytes Absolute: 0.5 10*3/uL (ref 0.1–1.0)
Monocytes Relative: 14 %
Neutro Abs: 1.1 10*3/uL — ABNORMAL LOW (ref 1.7–7.7)
Neutrophils Relative %: 31 %
Platelet Count: 221 10*3/uL (ref 150–400)
RBC: 3.66 MIL/uL — ABNORMAL LOW (ref 3.87–5.11)
RDW: 13.5 % (ref 11.5–15.5)
WBC Count: 3.7 10*3/uL — ABNORMAL LOW (ref 4.0–10.5)
nRBC: 0 % (ref 0.0–0.2)

## 2020-08-17 LAB — CMP (CANCER CENTER ONLY)
ALT: 35 U/L (ref 0–44)
AST: 31 U/L (ref 15–41)
Albumin: 4 g/dL (ref 3.5–5.0)
Alkaline Phosphatase: 71 U/L (ref 38–126)
Anion gap: 10 (ref 5–15)
BUN: 12 mg/dL (ref 8–23)
CO2: 26 mmol/L (ref 22–32)
Calcium: 9.5 mg/dL (ref 8.9–10.3)
Chloride: 104 mmol/L (ref 98–111)
Creatinine: 1.05 mg/dL — ABNORMAL HIGH (ref 0.44–1.00)
GFR, Estimated: 57 mL/min — ABNORMAL LOW (ref >60)
Glucose, Bld: 101 mg/dL — ABNORMAL HIGH (ref 70–99)
Potassium: 3.8 mmol/L (ref 3.5–5.1)
Sodium: 140 mmol/L (ref 135–145)
Total Bilirubin: 0.3 mg/dL (ref 0.3–1.2)
Total Protein: 7.5 g/dL (ref 6.5–8.1)

## 2020-08-17 MED ORDER — ACETAMINOPHEN 325 MG PO TABS
ORAL_TABLET | ORAL | Status: AC
  Filled 2020-08-17: qty 2

## 2020-08-17 MED ORDER — FLUOROURACIL CHEMO INJECTION 2.5 GM/50ML
600.0000 mg/m2 | Freq: Once | INTRAVENOUS | Status: AC
  Administered 2020-08-17: 1050 mg via INTRAVENOUS
  Filled 2020-08-17: qty 21

## 2020-08-17 MED ORDER — PALONOSETRON HCL INJECTION 0.25 MG/5ML
INTRAVENOUS | Status: AC
  Filled 2020-08-17: qty 5

## 2020-08-17 MED ORDER — DIPHENHYDRAMINE HCL 25 MG PO CAPS
ORAL_CAPSULE | ORAL | Status: AC
  Filled 2020-08-17: qty 1

## 2020-08-17 MED ORDER — SODIUM CHLORIDE 0.9 % IV SOLN
Freq: Once | INTRAVENOUS | Status: AC
  Filled 2020-08-17: qty 250

## 2020-08-17 MED ORDER — SODIUM CHLORIDE 0.9% FLUSH
10.0000 mL | Freq: Once | INTRAVENOUS | Status: AC
  Administered 2020-08-17: 10 mL
  Filled 2020-08-17: qty 10

## 2020-08-17 MED ORDER — DIPHENHYDRAMINE HCL 25 MG PO CAPS
25.0000 mg | ORAL_CAPSULE | Freq: Once | ORAL | Status: AC
  Administered 2020-08-17: 25 mg via ORAL

## 2020-08-17 MED ORDER — SODIUM CHLORIDE 0.9 % IV SOLN
600.0000 mg/m2 | Freq: Once | INTRAVENOUS | Status: AC
  Administered 2020-08-17: 1060 mg via INTRAVENOUS
  Filled 2020-08-17: qty 53

## 2020-08-17 MED ORDER — SODIUM CHLORIDE 0.9 % IV SOLN
10.0000 mg | Freq: Once | INTRAVENOUS | Status: AC
  Administered 2020-08-17: 10 mg via INTRAVENOUS
  Filled 2020-08-17: qty 10

## 2020-08-17 MED ORDER — METHOTREXATE SODIUM (PF) CHEMO INJECTION 250 MG/10ML
40.0000 mg/m2 | Freq: Once | INTRAMUSCULAR | Status: AC
  Administered 2020-08-17: 70 mg via INTRAVENOUS
  Filled 2020-08-17: qty 2.8

## 2020-08-17 MED ORDER — TRASTUZUMAB-ANNS CHEMO 150 MG IV SOLR
6.0000 mg/kg | Freq: Once | INTRAVENOUS | Status: AC
  Administered 2020-08-17: 420 mg via INTRAVENOUS
  Filled 2020-08-17: qty 20

## 2020-08-17 MED ORDER — ACETAMINOPHEN 325 MG PO TABS
650.0000 mg | ORAL_TABLET | Freq: Once | ORAL | Status: AC
  Administered 2020-08-17: 650 mg via ORAL

## 2020-08-17 MED ORDER — PALONOSETRON HCL INJECTION 0.25 MG/5ML
0.2500 mg | Freq: Once | INTRAVENOUS | Status: AC
  Administered 2020-08-17: 0.25 mg via INTRAVENOUS

## 2020-08-17 MED ORDER — HEPARIN SOD (PORK) LOCK FLUSH 100 UNIT/ML IV SOLN
500.0000 [IU] | Freq: Once | INTRAVENOUS | Status: AC | PRN
Start: 2020-08-17 — End: 2020-08-17
  Administered 2020-08-17: 500 [IU]
  Filled 2020-08-17: qty 5

## 2020-08-17 MED ORDER — SODIUM CHLORIDE 0.9% FLUSH
10.0000 mL | INTRAVENOUS | Status: DC | PRN
  Administered 2020-08-17: 10 mL
  Filled 2020-08-17: qty 10

## 2020-08-17 NOTE — Patient Instructions (Signed)
[facility] Discharge Instructions for Patients Receiving Chemotherapy  Today you received the following chemotherapy agents fluorourcil, cytoxan, methotrexate, herceptin.  To help prevent nausea and vomiting after your treatment, we encourage you to take your nausea medication as directed.   If you develop nausea and vomiting that is not controlled by your nausea medication, call the clinic.   BELOW ARE SYMPTOMS THAT SHOULD BE REPORTED IMMEDIATELY:  *FEVER GREATER THAN 100.5 F  *CHILLS WITH OR WITHOUT FEVER  NAUSEA AND VOMITING THAT IS NOT CONTROLLED WITH YOUR NAUSEA MEDICATION  *UNUSUAL SHORTNESS OF BREATH  *UNUSUAL BRUISING OR BLEEDING  TENDERNESS IN MOUTH AND THROAT WITH OR WITHOUT PRESENCE OF ULCERS  *URINARY PROBLEMS  *BOWEL PROBLEMS  UNUSUAL RASH Items with * indicate a potential emergency and should be followed up as soon as possible.  Feel free to call the clinic should you have any questions or concerns. The clinic phone number is [phone].  Please show the [place] at check-in to the Emergency Department and triage nurse.

## 2020-08-17 NOTE — Progress Notes (Signed)
Per Dr. Magrinat, ok to treat with ANC 1.1 

## 2020-08-17 NOTE — Addendum Note (Signed)
Addended by: Chauncey Cruel on: 08/17/2020 01:50 PM   Modules accepted: Orders

## 2020-08-18 ENCOUNTER — Telehealth: Payer: Self-pay

## 2020-08-18 ENCOUNTER — Other Ambulatory Visit: Payer: Self-pay | Admitting: Oncology

## 2020-08-18 ENCOUNTER — Ambulatory Visit: Payer: Medicare PPO | Attending: Family Medicine

## 2020-08-18 ENCOUNTER — Telehealth: Payer: Self-pay | Admitting: Oncology

## 2020-08-18 DIAGNOSIS — R609 Edema, unspecified: Secondary | ICD-10-CM | POA: Diagnosis not present

## 2020-08-18 DIAGNOSIS — R293 Abnormal posture: Secondary | ICD-10-CM | POA: Diagnosis not present

## 2020-08-18 DIAGNOSIS — C50412 Malignant neoplasm of upper-outer quadrant of left female breast: Secondary | ICD-10-CM

## 2020-08-18 NOTE — Telephone Encounter (Signed)
Scheduled follow up per 3/1 los. Made no change to pt's original arrival time.

## 2020-08-18 NOTE — Therapy (Signed)
[place], [place], [address] Phone: [phone]   Fax:  [phone]  Physical Therapy Treatment  Patient Details  Name: April [name] MRN: [id number] Date of Birth: 07-10-1950 Referring Provider (PT): Dr. Donne Hazel   Encounter Date: 08/18/2020   PT End of Session - 08/18/20 1144    Visit Number 7    Number of Visits 11    Date for PT Re-Evaluation 08/30/20    PT Start Time 1102    PT Stop Time 1158    PT Time Calculation (min) 56 min    Activity Tolerance Patient tolerated treatment well    Behavior During Therapy [facility] for tasks assessed/performed           Past Medical History:  Diagnosis Date  . Breast cancer ([place]) 05/2017   right breast  . Eczema   . Family history of breast cancer   . Family history of ovarian cancer   . History of radiation therapy 10/17/17- 11/14/17   40.05 directed to the right breast in 15 fractions, followed by a boost of 10 gy given in 5 fractions.   . Hypertension    toxemia during pregnancy, no meds now    Past Surgical History:  Procedure Laterality Date  . ABDOMINAL HYSTERECTOMY    . BREAST LUMPECTOMY WITH RADIOACTIVE SEED AND SENTINEL LYMPH NODE BIOPSY Right 07/17/2017   Procedure: BREAST LUMPECTOMY WITH RADIOACTIVE SEED AND SENTINEL LYMPH NODE BIOPSY;  Surgeon: Rolm Bookbinder, MD;  Location: [place];  Service: General;  Laterality: Right;  . BREAST LUMPECTOMY WITH RADIOACTIVE SEED AND SENTINEL LYMPH NODE BIOPSY Left 07/13/2020   Procedure: LEFT BREAST LUMPECTOMY WITH RADIOACTIVE SEED AND LEFT AXILLARY SENTINEL LYMPH NODE BIOPSY;  Surgeon: Rolm Bookbinder, MD;  Location: [place];  Service: General;  Laterality: Left;  . CESAREAN SECTION     x4  . DILATION AND CURETTAGE OF UTERUS    . KNEE SURGERY Right   . PORT-A-CATH REMOVAL Right 09/04/2018   Procedure: REMOVAL PORT-A-CATH;  Surgeon: Rolm Bookbinder, MD;   Location: [place];  Service: General;  Laterality: Right;  . PORTACATH PLACEMENT N/A 07/17/2017   Procedure: INSERTION PORT-A-CATH WITH [place];  Surgeon: Rolm Bookbinder, MD;  Location: [place];  Service: General;  Laterality: N/A;  . PORTACATH PLACEMENT Right 07/13/2020   Procedure: INSERTION PORT-A-CATH WITH ULTRASOUND GUIDANCE;  Surgeon: Rolm Bookbinder, MD;  Location: [place];  Service: General;  Laterality: Right;    There were no vitals filed for this visit.   Subjective Assessment - 08/18/20 1101    Subjective Wearing chip pack today and it has flattened out some.  Can see a little cording in upper arm but it doesn't really bother me. Did MLD last night and I think I did a good job. My breast still feels heavy and is a little tender around the incision    Pertinent History Pt with prior history of right breast cancer  with  with surgery on 07/17/17 for right lumpectomy and SLNB with 5 LN removed.  Had chemo and radiation. Most recent surgery1/25/22 for left breast lumpectomy with 0/5 positive nodes. She will have chemo, radiation and infusions for immunotherapy.              [facility] PT Assessment - 08/18/20 0001      AROM   Left Shoulder Extension 55 Degrees    Left Shoulder Flexion 163 Degrees    Left  Shoulder ABduction 175 Degrees    Left Shoulder External Rotation 87 Degrees                         [facility] Adult PT Treatment/Exercise - 08/18/20 0001      Manual Therapy   Soft tissue mobilization gentle scar massage to incision sites    Myofascial Release MFR left axillary region and medial upper arm    Manual Lymphatic Drainage (MLD) Therapist performed supraclavicular, left axillary LN, left axillo-inguinal pathway, and left lateral breast/trunk in supine and left lateral upper arm, medial to lateral and lateral arm again  retracing all steps and ending with LN's . Pt return demonstrated left breast MLD    Passive ROM PROM left shoulder in all directions                       PT Long Term Goals - 08/18/20 1148      PT LONG TERM GOAL #1   Title Patient will demonstrate she has returned to baseline since surgery related to shoulder ROM and function.    Time 4    Period Weeks    Status Achieved      PT LONG TERM GOAL #2   Title Pt will be independent in left breast MLD    Time 3    Period Weeks    Status Achieved      PT LONG TERM GOAL #3   Title Pt will report decreased breast heaviness by 50% or greater    Time 4    Period Weeks    Status On-going      PT LONG TERM GOAL #4   Title Pt will be independent with scar massage    Period Weeks    Status On-going      PT LONG TERM GOAL #5   Title Pt will be fit for compression sleeve and will be independent in its use    Time 2    Period Weeks    Status New    Target Date 09/01/20                 Plan - 08/18/20 1200    Clinical Impression Statement pt has achieved goals for ROM and being independent with MLD.  Breast fibrosis has improved and there is much less firmness noted under incsions.  Axillary incision has flattened out nicely with chip pack. Pt did very well today with Self MLD and required very litle cueing. She has not yet achieved goal for breast heaviness.  We are also awaiting pt to be measured for compression sleeve/gauntlet/bra through [facility] with Melissa    Personal Factors and Comorbidities Comorbidity 2    Comorbidities Hx of bilateral breast CA with new onset of left Breast CA. ( right in 2019), lymphedema risk    Stability/Clinical Decision Making Stable/Uncomplicated    Rehab Potential Excellent    PT Frequency 2x / week    PT Treatment/Interventions ADLs/Self Care Home Management;Therapeutic exercise;Patient/family education;Manual techniques;Manual lymph drainage;Scar mobilization;Passive range of motion    PT Next Visit Plan MFR, review scar massage, Review MLD, add jobes flex/scaption.   May decrease to 1x/week    PT Home Exercise Plan 4 post op exercises, ABC class, Supine scapular stabs x 10 with yellow Theraband    Consulted and Agree with Plan of Care Patient           Patient will benefit from skilled therapeutic intervention  in order to improve the following deficits and impairments:  Postural dysfunction,Decreased knowledge of precautions,Decreased scar mobility,Increased edema,Decreased range of motion,Decreased strength  Visit Diagnosis: Abnormal posture  Edema, unspecified type  Malignant neoplasm of upper-outer quadrant of left female breast, unspecified estrogen receptor status ([place])     Problem List Patient Active Problem List   Diagnosis Date Noted  . Neuropathy due to chemotherapeutic drug ([place]) 07/23/2018  . Port-A-Cath in place 08/28/2017  . TIA (transient ischemic attack) 08/09/2017  . Hypertension   . Genetic testing 07/06/2017  . Family history of ovarian cancer   . Family history of breast cancer   . Malignant neoplasm of upper-outer quadrant of right breast in female, estrogen receptor negative ([place]) 06/26/2017  . Malignant neoplasm of left breast, estrogen receptor positive ([place]) 05/19/2017    Claris Pong 08/18/2020, 12:06 PM  [place], [place], [address] Phone: [phone]   Fax:  [phone]  Name: April [name] MRN: [id number] Date of Birth: [birth year] Cheral Almas, PT 08/18/20 12:08 PM

## 2020-08-18 NOTE — Telephone Encounter (Signed)
Pt called in to report a rash post second tx yesterday. Rash is on left side above her port. Rash is not raised , warm to touch, painful , blistered or itchy. Instructed pt to monitor and call back if it changes at all. Pt acknowledged.

## 2020-08-19 ENCOUNTER — Inpatient Hospital Stay: Payer: Medicare PPO

## 2020-08-21 ENCOUNTER — Inpatient Hospital Stay: Payer: Medicare PPO

## 2020-08-23 ENCOUNTER — Ambulatory Visit: Payer: Medicare PPO

## 2020-08-23 ENCOUNTER — Other Ambulatory Visit: Payer: Self-pay

## 2020-08-23 DIAGNOSIS — R293 Abnormal posture: Secondary | ICD-10-CM

## 2020-08-23 DIAGNOSIS — R609 Edema, unspecified: Secondary | ICD-10-CM

## 2020-08-23 DIAGNOSIS — C50412 Malignant neoplasm of upper-outer quadrant of left female breast: Secondary | ICD-10-CM

## 2020-08-23 NOTE — Therapy (Signed)
[place], [place], [address] Phone: [phone]   Fax:  [phone]  Physical Therapy Treatment  Patient Details  Name: April [name] MRN: [id number] Date of Birth: 1951-03-07 Referring Provider (PT): Dr. Donne Hazel   Encounter Date: 08/23/2020   PT End of Session - 08/23/20 1145    Visit Number 8    Number of Visits 11    Date for PT Re-Evaluation 08/30/20    PT Start Time 1103    PT Stop Time 1153    PT Time Calculation (min) 50 min    Activity Tolerance Patient tolerated treatment well    Behavior During Therapy [facility] for tasks assessed/performed           Past Medical History:  Diagnosis Date  . Breast cancer ([place]) 05/2017   right breast  . Eczema   . Family history of breast cancer   . Family history of ovarian cancer   . History of radiation therapy 10/17/17- 11/14/17   40.05 directed to the right breast in 15 fractions, followed by a boost of 10 gy given in 5 fractions.   . Hypertension    toxemia during pregnancy, no meds now    Past Surgical History:  Procedure Laterality Date  . ABDOMINAL HYSTERECTOMY    . BREAST LUMPECTOMY WITH RADIOACTIVE SEED AND SENTINEL LYMPH NODE BIOPSY Right 07/17/2017   Procedure: BREAST LUMPECTOMY WITH RADIOACTIVE SEED AND SENTINEL LYMPH NODE BIOPSY;  Surgeon: Rolm Bookbinder, MD;  Location: [place];  Service: General;  Laterality: Right;  . BREAST LUMPECTOMY WITH RADIOACTIVE SEED AND SENTINEL LYMPH NODE BIOPSY Left 07/13/2020   Procedure: LEFT BREAST LUMPECTOMY WITH RADIOACTIVE SEED AND LEFT AXILLARY SENTINEL LYMPH NODE BIOPSY;  Surgeon: Rolm Bookbinder, MD;  Location: [place];  Service: General;  Laterality: Left;  . CESAREAN SECTION     x4  . DILATION AND CURETTAGE OF UTERUS    . KNEE SURGERY Right   . PORT-A-CATH REMOVAL Right 09/04/2018   Procedure: REMOVAL PORT-A-CATH;  Surgeon: Rolm Bookbinder, MD;   Location: [place];  Service: General;  Laterality: Right;  . PORTACATH PLACEMENT N/A 07/17/2017   Procedure: INSERTION PORT-A-CATH WITH [place];  Surgeon: Rolm Bookbinder, MD;  Location: [place];  Service: General;  Laterality: N/A;  . PORTACATH PLACEMENT Right 07/13/2020   Procedure: INSERTION PORT-A-CATH WITH ULTRASOUND GUIDANCE;  Surgeon: Rolm Bookbinder, MD;  Location: [place];  Service: General;  Laterality: Right;    There were no vitals filed for this visit.   Subjective Assessment - 08/23/20 1103    Subjective Scar is doing much better and no longer feeling the knot underneath. Breast heaviness is much better but still feels a little firmness around the incision at areola. Has been doing the lymph drainage at home and feels she is doing correctly for the most part.  Radiation should start end of March because the simulation is March 16.    Pertinent History Pt with prior history of right breast cancer  with  with surgery on 07/17/17 for right lumpectomy and SLNB with 5 LN removed.  Had chemo and radiation. Most recent surgery1/25/22 for left breast lumpectomy with 0/5 positive nodes. She will have chemo, radiation and infusions for immunotherapy.    Patient Stated Goals Want to decrease tightness in axilla and knot in incision region., decrease swelling in left breast    Currently in Pain? No/denies    Pain Score 0-No  pain    Multiple Pain Sites No                             [facility] Adult PT Treatment/Exercise - 08/23/20 0001      Shoulder Exercises: Standing   External Rotation Strengthening;Both;10 reps    Theraband Level (Shoulder External Rotation) Level 1 (Yellow)    Extension Both;Strengthening;10 reps    Theraband Level (Shoulder Extension) Level 1 (Yellow)    Retraction Strengthening;Both;10 reps    Theraband Level (Shoulder Retraction) Level 1 (Yellow)    Other Standing Exercises Jobes flexion and  scaption 1# x 10    Other Standing Exercises bilateral elbow flexion with yellow band x 10      Manual Therapy   Soft tissue mobilization gentle scar massage to incision sites    Myofascial Release MFR left axillary region    Manual Lymphatic Drainage (MLD) Therapist performed supraclavicular, left axillary LN, left axillo-inguinal pathway, and left lateral breast/trunk in supine and left lateral upper arm, medial to lateral and lateral arm again  retracing all steps and ending with LN's    Passive ROM PROM left shoulder briefly in all directions                       PT Long Term Goals - 08/18/20 1148      PT LONG TERM GOAL #1   Title Patient will demonstrate she has returned to baseline since surgery related to shoulder ROM and function.    Time 4    Period Weeks    Status Achieved      PT LONG TERM GOAL #2   Title Pt will be independent in left breast MLD    Time 3    Period Weeks    Status Achieved      PT LONG TERM GOAL #3   Title Pt will report decreased breast heaviness by 50% or greater    Time 4    Period Weeks    Status On-going      PT LONG TERM GOAL #4   Title Pt will be independent with scar massage    Period Weeks    Status On-going      PT LONG TERM GOAL #5   Title Pt will be fit for compression sleeve and will be independent in its use    Time 2    Period Weeks    Status New    Target Date 09/01/20                 Plan - 08/23/20 1201    Clinical Impression Statement No more cording is evident at left axillary region and there is no firmness noted under axillary incision and minimal at incision at areola.  She is compliant with exs and MLD.  She was measured by Melissa on Friday and we are awaiting her sleeve/bra.  She will come 1 x next week and hopefully will have her sleeve/bra by then so they can be checked.    Personal Factors and Comorbidities Comorbidity 2    Comorbidities Hx of bilateral breast CA with new onset of left  Breast CA. ( right in 2019), lymphedema risk    Stability/Clinical Decision Making Stable/Uncomplicated    Rehab Potential Excellent    PT Frequency 2x / week    PT Duration 4 weeks    PT Treatment/Interventions ADLs/Self Care Home Management;Therapeutic exercise;Patient/family education;Manual techniques;Manual lymph  drainage;Scar mobilization;Passive range of motion    PT Next Visit Plan chck sleeve/bras if in, REview MLD, review jobes flex and scaption and give pics    PT Home Exercise Plan 4 post op exercises, ABC class, Supine scapular stabs x 10 with yellow Theraband    Recommended Other Services check sleeve/bras when they come in    Consulted and Agree with Plan of Care Patient           Patient will benefit from skilled therapeutic intervention in order to improve the following deficits and impairments:  Postural dysfunction,Decreased knowledge of precautions,Decreased scar mobility,Increased edema,Decreased range of motion,Decreased strength  Visit Diagnosis: Abnormal posture  Edema, unspecified type  Malignant neoplasm of upper-outer quadrant of left female breast, unspecified estrogen receptor status ([place])     Problem List Patient Active Problem List   Diagnosis Date Noted  . Neuropathy due to chemotherapeutic drug ([place]) 07/23/2018  . Port-A-Cath in place 08/28/2017  . TIA (transient ischemic attack) 08/09/2017  . Hypertension   . Genetic testing 07/06/2017  . Family history of ovarian cancer   . Family history of breast cancer   . Malignant neoplasm of upper-outer quadrant of right breast in female, estrogen receptor negative ([place]) 06/26/2017  . Malignant neoplasm of left breast, estrogen receptor positive ([place]) 05/19/2017    Claris Pong 08/23/2020, 12:06 PM  [place], [place], [address] Phone: [phone]   Fax:  [phone]  Name: April [name] MRN: [id number] Date  of Birth: [birth year] Cheral Almas, PT 08/23/20 12:07 PM

## 2020-08-25 ENCOUNTER — Ambulatory Visit: Payer: Medicare PPO

## 2020-08-30 NOTE — Progress Notes (Signed)
Location of Breast Cancer: Malignant neoplasm of upper-inner quadrant of LEFT breast in female, estrogen receptor positive  Histology per Pathology Report:  07/13/2020 FINAL MICROSCOPIC DIAGNOSIS:  A. BREAST, LEFT, LUMPECTOMY:  - Invasive ductal carcinoma, grade 3, spanning 1.3 cm.  - High grade ductal carcinoma in situ with necrosis.  - Lymphovascular invasion.  - Superior and lateral margins are negative for carcinoma, see parts G-J for remaining final margin status.  - Biopsy site.  - Se oncology table.  B. LYMPH NODE, LEFT AXILLARY, SENTINEL, EXCISION:  - One of one lymph nodes negative for carcinoma (0/1).  C. LYMPH NODE, LEFT AXILLARY, SENTINEL, EXCISION:  - One of one lymph nodes negative for carcinoma (0/1).  D. LYMPH NODE, LEFT AXILLARY, SENTINEL, EXCISION:  - One of one lymph nodes negative for carcinoma (0/1).  E. LYMPH NODE, LEFT AXILLARY, SENTINEL, EXCISION:  - One of one lymph nodes negative for carcinoma (0/1).  F. LYMPH NODE, LEFT AXILLARY, SENTINEL, EXCISION:  - One of one lymph nodes negative for carcinoma (0/1).  G. BREAST, LEFT ADDITIONAL INFERIOR MARGIN, EXCISION:  - High grade ductal carcinoma in situ.  - In situ carcinoma is <1 mm from the new inferior margin broadly.  - Complex sclerosing lesion with usual ductal hyperplasia.  H. BREAST, LEFT ADDITIONAL MEDIAL MARGIN, EXCISION:  - High grade ductal carcinoma in situ.  - In situ carcinoma is 2 mm from the new medial margin focally.  - Complex sclerosing lesion with usual ductal hyperplasia.  - Intraductal papilloma.  - Fibrocystic change and usual ductal hyperplasia with calcifications.  I. BREAST, LEFT ADDITIONAL POSTERIOR MARGIN, EXCISION:  - Invasive ductal carcinoma, grade 3, spanning 2 mm.  - High grade ductal carcinoma in situ.  - New posterior margin is negative for invasive carcinoma.  - In situ carcinoma is 3 mm from the new posterior margin focally.  J. BREAST, LEFT ADDITIONAL ANTERIOR MARGIN,  EXCISION:  - Complex sclerosing lesion with usual ductal hyperplasia.  - Intraductal papilloma.  - Fibrocystic change with calcifications.  - No malignancy identified.  Receptor Status: ER(40%), PR (Negative), Her2-neu (Positive), Ki-67(45%)  Did patient present with symptoms (if so, please note symptoms) or was this found on screening mammography?:  Patient underwent bilateral diagnostic mammography with [facility] on [facility]15/2020 showing: breast density category C; this showed in the right breast evidence of postsurgical and radiation changes but this was all stable.  On the left however there was a new group of pleomorphic calcifications in the upper inner quadrant.  Past/Anticipated interventions by surgeon, if any: 07/13/2020 1.  Right internal jugular port placement with ultrasound guidance 2.  Left breast radioactive seed guided lumpectomy 3.  Left axillary sentinel lymph node biopsy, deep  Past/Anticipated interventions by medical oncology, if any:  Under the care of Dr. Sarajane Jews Magrinat 08/17/2020 --LEFT BREAST CANCER: (7) status post left breast upper inner quadrant biopsy 06/15/2020 for a clinical T1 N0 invasive ductal carcinoma, grade 3, weakly estrogen receptor positive, progesterone receptor negative, HER2 amplified, with an MIB-1 of 45%. (8) status post left lumpectomy and sentinel lymph node sampling 07/13/2020 for a pT1c pN0, stage IB invasive ductal carcinoma, grade 3, with negative margins             (a) a total of 5 axilla lymph nodes were removed, all benign             (b) repeat prognostic panel again weakly estrogen receptor positive, progesterone receptor negative, now HER-2 not amplified (9) adjuvant  chemotherapy and anti-HER2 immunotherapy to start 07/27/2020, consisting of CMF chemotherapy to be repeated every 21 days and trastuzumab to be continued for 1 year.             (a) echocardiogram 05/28/2020 shows an ejection fraction in the 65-70% range (10)  trastuzumab to be continued a minimum of 1 year (11) adjuvant radiation can be given concurrently with CMF (12) to start letrozole at the completion of local treatment PLAN: --April [name] proceeds with her second cycle of CMF today.  She has generally done quite well.  Her ANC is on the low side and that is something to watch but I am not adding PEG fill gastrium at this point. --I have encouraged her to be as normal as possible but certainly if she develops a temperature above 100 she will let us. --She will see [place] again in 3 weeks and she will see me again specifically in 6 weeks. --I have alerted her radiation oncologist that the patient may undergo radiation and during her CMF treatments at her and the patient's discretion  --RIGHT BREAST CANCER (1) genetics testing 07/05/2017 through the Common Hereditary Cancer Panel offered by [facility] found no deleterious mutations in APC, ATM, AXIN2, BARD1, BMPR1A, BRCA1, BRCA2, BRIP1, CDH1, CDKN2A (p14ARF), CDKN2A (p16INK4a), CKD4, CHEK2, CTNNA1, DICER1, EPCAM (Deletion/duplication testing only), GREM1 (promoter region deletion/duplication testing only), KIT, MEN1, MLH1, MSH2, MSH3, MSH6, MUTYH, NBN, NF1, NHTL1, PALB2, PDGFRA, PMS2, POLD1, POLE, PTEN, RAD50, RAD51C, RAD51D, SDHB, SDHC, SDHD, SMAD4, SMARCA4. STK11, TP53, TSC1, TSC2, and VHL.  The following genes were evaluated for sequence changes only: SDHA and HOXB13 c.251G>A variant only.             (a) a Variant of uncertain significance in MSH2 was identified c.1331G>T (p.Arg444Leu).  (2) status post right lumpectomy and sentinel lymph node sampling 07/17/2017 for a pT1c pN0, stage IA invasive ductal carcinoma, grade 2, with negative margins.  A total of 5 lymph nodes were removed (3) adjuvant chemotherapy consisting of paclitaxel weekly x12 together with trastuzumab every 21 days starting 08/07/2017             (a) paclitaxel stopped after 8 doses because of neuropathy (last dose 09/25/2017 (4) trastuzumab  continued to total 1 year (last dose 07/23/2018)             (a) echo 08/10/2017 showed an ejection fraction in the 65-70% range             (b) echo on 11/01/2017 shows EF of 60-65%             (c) cardiogram 02/05/2018 shows an ejection fraction in the 60-65% range.             (d) echocardiogram 05/28/2018 shows an ejection fraction in the 65-70%. (5) adjuvant radiation 10/17/2017-11/14/2016: 40.05 Gy directed to the Right Breast in 15 fractions, followed by a boost of 10 Gy given in 5 fractions  (6) anemia, with normal MCV, ferritin, B12, folate, and inappropriately normal reticulocyte count, consistent with anemia of chronic illness  Lymphedema issues, if any:  Patient denies. She has been going to PT twice a week for several weeks, and will have her last session this Thursday.    Pain issues, if any:  Patient denies   SAFETY ISSUES:  Prior radiation? Yes: 10/17/17-11/14/17: 40.05 Gy directed to the Right Breast in 15 fractions, followed by a boost of 10 Gy given in 5 fractions (Dr. Eppie Gibson)  Pacemaker/ICD? No  Possible current pregnancy?  No--hysterectomy  Is the patient on methotrexate? Dr. Jana Hakim plans to hold from CMF regimen when patient starts radiation  Current Complaints / other details:   Patient has received both Pfizer vaccines as well as Product/process development scientist

## 2020-08-31 ENCOUNTER — Other Ambulatory Visit: Payer: Self-pay | Admitting: Oncology

## 2020-08-31 ENCOUNTER — Other Ambulatory Visit: Payer: Self-pay

## 2020-08-31 ENCOUNTER — Ambulatory Visit
Admission: RE | Admit: 2020-08-31 | Discharge: 2020-08-31 | Disposition: A | Discharge status: Home / Self Care | Payer: Medicare PPO | Source: Ambulatory Visit | Attending: Radiation Oncology | Admitting: Radiation Oncology

## 2020-08-31 VITALS — BP 152/76 | HR 73 | Temp 96.8°F | Resp 18 | Ht 63.5 in | Wt 152.4 lb

## 2020-08-31 DIAGNOSIS — I10 Essential (primary) hypertension: Secondary | ICD-10-CM | POA: Insufficient documentation

## 2020-08-31 DIAGNOSIS — C50212 Malignant neoplasm of upper-inner quadrant of left female breast: Secondary | ICD-10-CM | POA: Diagnosis not present

## 2020-08-31 DIAGNOSIS — Z803 Family history of malignant neoplasm of breast: Secondary | ICD-10-CM | POA: Diagnosis not present

## 2020-08-31 DIAGNOSIS — Z170 Estrogen receptor positive status [ER+]: Secondary | ICD-10-CM

## 2020-08-31 DIAGNOSIS — Z79811 Long term (current) use of aromatase inhibitors: Secondary | ICD-10-CM | POA: Diagnosis not present

## 2020-08-31 DIAGNOSIS — Z7982 Long term (current) use of aspirin: Secondary | ICD-10-CM | POA: Insufficient documentation

## 2020-08-31 DIAGNOSIS — Z8041 Family history of malignant neoplasm of ovary: Secondary | ICD-10-CM | POA: Diagnosis not present

## 2020-08-31 DIAGNOSIS — Z79899 Other long term (current) drug therapy: Secondary | ICD-10-CM | POA: Insufficient documentation

## 2020-08-31 NOTE — Progress Notes (Signed)
Radiation Oncology         [phone] ________________________________  Re-Consultation Note  Name: April [name] MRN: [id number]  Date: 08/31/2020  DOB: 1950-12-30  RK:[id number], Margaretha Sheffield, MD  Magrinat, Virgie Dad, MD   REFERRING PHYSICIAN: Magrinat, Virgie Dad, MD  DIAGNOSIS:    ICD-10-CM   1. Malignant neoplasm of upper-inner quadrant of left breast in female, estrogen receptor positive [facility])  C50.212    Z17.0     Cancer Staging Malignant neoplasm of left breast, estrogen receptor positive ([place]) Staging form: Breast, AJCC 8th Edition - Pathologic stage from 07/13/2020: Stage IA (pT1c, pN0, cM0, G3, ER+, PR-, HER2+) - Signed by Gardenia Phlegm, NP on 07/28/2020 Stage prefix: Initial diagnosis Histologic grading system: 3 grade system  Malignant neoplasm of upper-outer quadrant of right breast in female, estrogen receptor negative ([place]) Staging form: Breast, AJCC 8th Edition - Clinical stage from 06/27/2017: Stage IA (cT1b, cN0, cM0, G3, ER: Negative, PR: Negative, HER2: Positive) - Signed by Eppie Gibson, MD on 06/27/2017 Histologic grading system: 3 grade system Laterality: Right Stage used in treatment planning: Yes National guidelines used in treatment planning: Yes Type of national guideline used in treatment planning: NCCN Staging comments: Staged at breast conference on 1.9.19 - Pathologic: Stage IA (pT1c, pN0, cM0, G2, ER-, PR-, HER2+) - Unsigned Histologic grading system: 3 grade system  LEFT BREAST PATHOLOGIC STAGE: mpT1c, pN0 Estrogen Receptor: Positive, 40%, weak staining; Progesterone Receptor: Negative, HER2 + ; Grade 3  CHIEF COMPLAINT: Here to discuss management of left breast cancer  HISTORY OF PRESENT ILLNESS::April [name] is a 70 y.o. female who is known to me after being treated for right breast cancer in 2019. Most recently, she presented with left breast abnormality on the following imaging: bilateral diagnostic mammogram on the date of  06/15/2020. No symptoms were reported at that time. Biopsy on the date of 06/15/2020 showed invasive ductal carcinoma with high-grade DCIS and necrosis. Lymphovascular invasion was also identified. ER status: 40% weak; PR status: 0% negative; Her2 status: positive; Grade: 3.  Additional imaging studies include: 1. CT scan of chest on 07/08/2020 did not show any evidence of metastatic disease in the chest. However, there was noted to be a 4.8 cm left renal cyst. Small hypodensities in the lateral segment of the left liver could not be definitely characterized but were likely benign.  2. Bone scan on 07/08/2020 did not show any scintigraphic evidence of skeletal metastases.  Breast/nodal surgery on the date of 07/13/2020 revealed: histology of invasive ductal carcinoma spanning 1.3 cm with high-grade DCIS and necrosis. Lymphovascular invasion was also present. Superior and lateral margins were negative for carcinoma. Left additional inferior margin was positive for high-grade DCIS that was <1 mm from the new inferior margin broadly. Left additional medial margin was positive for DCIS that was 2 mm from the new medial margin focally. Left additional anterior margin showed complex sclerosing lesion with usual ductal hyperplasia, intraductal papilloma, and fibrocystic change with calcifications, but no malignancy. Nodal status of negative (0/5). ER status: 40% weak; PR status: negative; Her2 status: positive. Grade: 3.  She began adjuvant CMF chemotherapy and anti-HER2 immunotherapy on 08/24/2020 under the care of Dr. Jana Hakim. Traztuzumab is to be continued for a minimum of one year and adjuvant radiation is to be given concurrently with CMF while holding the MTX. She is to begin Letrozole at the completion of local treatment.   Lymphedema issues, if any:  Patient denies. She has been going to PT twice  a week for several weeks, and will have her last session this Thursday.    Pain issues, if any:  Patient  denies   SAFETY ISSUES:  Prior radiation? Yes: 10/17/17-11/14/17: 40.05 Gy directed to the Right Breast in 15 fractions, followed by a boost of 10 Gy given in 5 fractions (Dr. Eppie Gibson)  Pacemaker/ICD? No  Possible current pregnancy? No--hysterectomy  Is the patient on methotrexate? Dr. Jana Hakim plans to hold MTX from CMF regimen when patient starts radiation  Current Complaints / other details:   Patient has received both Pfizer vaccines as well as [employer] booster     PAST MEDICAL HISTORY:  has a past medical history of Breast cancer ([place]) (05/2017), Eczema, Family history of breast cancer, Family history of ovarian cancer, History of radiation therapy (10/17/17- 11/14/17), and Hypertension.    PAST SURGICAL HISTORY: Past Surgical History:  Procedure Laterality Date   ABDOMINAL HYSTERECTOMY     BREAST LUMPECTOMY WITH RADIOACTIVE SEED AND SENTINEL LYMPH NODE BIOPSY Right 07/17/2017   Procedure: BREAST LUMPECTOMY WITH RADIOACTIVE SEED AND SENTINEL LYMPH NODE BIOPSY;  Surgeon: Rolm Bookbinder, MD;  Location: [place];  Service: General;  Laterality: Right;   BREAST LUMPECTOMY WITH RADIOACTIVE SEED AND SENTINEL LYMPH NODE BIOPSY Left 07/13/2020   Procedure: LEFT BREAST LUMPECTOMY WITH RADIOACTIVE SEED AND LEFT AXILLARY SENTINEL LYMPH NODE BIOPSY;  Surgeon: Rolm Bookbinder, MD;  Location: [place];  Service: General;  Laterality: Left;   CESAREAN SECTION     x4   DILATION AND CURETTAGE OF UTERUS     KNEE SURGERY Right    PORT-A-CATH REMOVAL Right 09/04/2018   Procedure: REMOVAL PORT-A-CATH;  Surgeon: Rolm Bookbinder, MD;  Location: [place];  Service: General;  Laterality: Right;   PORTACATH PLACEMENT N/A 07/17/2017   Procedure: INSERTION PORT-A-CATH WITH [place];  Surgeon: Rolm Bookbinder, MD;  Location: [place];  Service: General;  Laterality: N/A;   PORTACATH PLACEMENT Right 07/13/2020   Procedure:  INSERTION PORT-A-CATH WITH ULTRASOUND GUIDANCE;  Surgeon: Rolm Bookbinder, MD;  Location: [place];  Service: General;  Laterality: Right;    FAMILY HISTORY: family history includes Breast cancer (age of onset: 94) in her sister; Breast cancer (age of onset: 76) in her maternal grandmother; Breast cancer (age of onset: 87) in her mother; Heart attack (age of onset: 75) in her father; Ovarian cancer (age of onset: 44) in her cousin; Prostate cancer in her cousin.  SOCIAL HISTORY:  reports that she has never smoked. She has never used smokeless tobacco. She reports current alcohol use. She reports that she does not use drugs.  ALLERGIES: Tape, Betamethasone dipropionate, Cetirizine hcl, Lisinopril, Loratadine, Nsaids, Other, Statins, and Sulfa antibiotics  MEDICATIONS:  Current Outpatient Medications  Medication Sig Dispense Refill   aspirin 81 MG chewable tablet Chew 81 mg by mouth daily.     CALCIUM-VITAMIN D PO Take 1 tablet by mouth daily.     co-enzyme Q-10 30 MG capsule Take 30 mg by mouth daily.     hydrochlorothiazide (MICROZIDE) 12.5 MG capsule TAKE 1 CAPSULE BY MOUTH EVERY DAY 90 capsule 3   lidocaine-prilocaine (EMLA) cream Apply 1 application topically as needed. 30 g 0   lidocaine-prilocaine (EMLA) cream Apply to affected area once 30 g 3   Omega-3 Fatty Acids (FISH OIL) 1000 MG CAPS Take 1 capsule by mouth daily.     prochlorperazine (COMPAZINE) 10 MG tablet Take 1 tablet (10 mg total) by mouth every 6 (  six) hours as needed (Nausea or vomiting). 30 tablet 1   Red Yeast Rice Extract (RED YEAST RICE PO) Take by mouth.     traMADol (ULTRAM) 50 MG tablet Take 1 tablet (50 mg total) by mouth every 6 (six) hours as needed. 10 tablet 0   vitamin B-12 (CYANOCOBALAMIN) 1000 MCG tablet Take 1,000 mcg by mouth daily.     vitamin C (ASCORBIC ACID) 250 MG tablet Take 250 mg by mouth daily.     No current facility-administered medications for this encounter.     REVIEW OF SYSTEMS: As above  PHYSICAL EXAM:  height is 5' 3.5" (1.613 m) and weight is 152 lb 6 oz (69.1 kg). Her temporal temperature is 96.8 F (36 C) (abnormal). Her blood pressure is 152/76 (abnormal) and her pulse is 73. Her respiration is 18 and oxygen saturation is 100%.   General: Alert and oriented, in no acute distress Breasts: Excellent healing of left axillary and left lumpectomy scars.  Unremarkable right lumpectomy and right axillary scars persist from previous breast surgery.  ECOG = 0  0 - Asymptomatic (Fully active, able to carry on all predisease activities without restriction)  1 - Symptomatic but completely ambulatory (Restricted in physically strenuous activity but ambulatory and able to carry out work of a light or sedentary nature. For example, light housework, office work)  2 - Symptomatic, <50% in bed during the day (Ambulatory and capable of all self care but unable to carry out any work activities. Up and about more than 50% of waking hours)  3 - Symptomatic, >50% in bed, but not bedbound (Capable of only limited self-care, confined to bed or chair 50% or more of waking hours)  4 - Bedbound (Completely disabled. Cannot carry on any self-care. Totally confined to bed or chair)  5 - Death   Eustace Pen MM, Creech RH, Tormey DC, et al. [phone]). "Toxicity and response criteria of the [facility]". [place] Oncol. 5 (6): 649-55   LABORATORY DATA:  Lab Results  Component Value Date   WBC 3.7 (L) 08/17/2020   HGB 10.8 (L) 08/17/2020   HCT 32.2 (L) 08/17/2020   MCV 88.0 08/17/2020   PLT 221 08/17/2020   CMP     Component Value Date/Time   NA 140 08/17/2020 1234   K 3.8 08/17/2020 1234   CL 104 08/17/2020 1234   CO2 26 08/17/2020 1234   GLUCOSE 101 (H) 08/17/2020 1234   BUN 12 08/17/2020 1234   CREATININE 1.05 (H) 08/17/2020 1234   CALCIUM 9.5 08/17/2020 1234   PROT 7.5 08/17/2020 1234   ALBUMIN 4.0 08/17/2020 1234   AST 31  08/17/2020 1234   ALT 35 08/17/2020 1234   ALKPHOS 71 08/17/2020 1234   BILITOT 0.3 08/17/2020 1234   GFRNONAA 57 (L) 08/17/2020 1234   GFRAA >60 07/10/2019 1008   GFRAA >60 06/27/2017 0843         RADIOGRAPHY: ECHOCARDIOGRAM COMPLETE  Result Date: 08/14/2020    ECHOCARDIOGRAM REPORT   Patient Name:   SRUTI AYLLON Date of Exam: 08/13/2020 Medical Rec #:  [id number]           Height:       63.5 in Accession #:    [id number]          Weight:       151.0 lb Date of Birth:  Dec 30, 1950           BSA:  1.726 m Patient Age:    79 years            BP:           161/81 mmHg Patient Gender: F                   HR:           95 bpm. Exam Location:  [address] Procedure: 2D Echo, Cardiac Doppler and Color Doppler Indications:    Z09 Chemotherapy  History:        Patient has prior history of Echocardiogram examinations, most                 recent 05/28/2018. TIA; Risk Factors:Hypertension. Breast                 cancer.  Sonographer:    Diamond Nickel RCS Referring Phys: [place] Comments: Attempted 3D. IMPRESSIONS  1. Left ventricular ejection fraction, by estimation, is 65 to 70%. The left ventricle has normal function. The left ventricle has no regional wall motion abnormalities. There is mild asymmetric left ventricular hypertrophy of the septal segment. Left ventricular diastolic parameters are consistent with Grade I diastolic dysfunction (impaired relaxation). The average left ventricular global longitudinal strain is -19.3 %. The global longitudinal strain is normal.  2. Right ventricular systolic function is normal. The right ventricular size is normal. Tricuspid regurgitation signal is inadequate for assessing PA pressure.  3. The mitral valve is normal in structure. Trivial mitral valve regurgitation. No evidence of mitral stenosis.  4. The aortic valve is grossly normal. Aortic valve regurgitation is not visualized. No aortic stenosis is present.  5. The  inferior vena cava is normal in size with greater than 50% respiratory variability, suggesting right atrial pressure of 3 mmHg. FINDINGS  Left Ventricle: Left ventricular ejection fraction, by estimation, is 65 to 70%. The left ventricle has normal function. The left ventricle has no regional wall motion abnormalities. The average left ventricular global longitudinal strain is -19.3 %. The global longitudinal strain is normal. The left ventricular internal cavity size was normal in size. There is mild asymmetric left ventricular hypertrophy of the septal segment. Left ventricular diastolic parameters are consistent with Grade I diastolic dysfunction (impaired relaxation). Right Ventricle: The right ventricular size is normal. No increase in right ventricular wall thickness. Right ventricular systolic function is normal. Tricuspid regurgitation signal is inadequate for assessing PA pressure. Left Atrium: Left atrial size was normal in size. Right Atrium: Right atrial size was normal in size. Pericardium: There is no evidence of pericardial effusion. Mitral Valve: The mitral valve is normal in structure. Trivial mitral valve regurgitation. No evidence of mitral valve stenosis. Tricuspid Valve: The tricuspid valve is normal in structure. Tricuspid valve regurgitation is not demonstrated. No evidence of tricuspid stenosis. Aortic Valve: The aortic valve is grossly normal. Aortic valve regurgitation is not visualized. No aortic stenosis is present. Pulmonic Valve: The pulmonic valve was normal in structure. Pulmonic valve regurgitation is not visualized. No evidence of pulmonic stenosis. Aorta: The aortic root is normal in size and structure. Venous: The inferior vena cava is normal in size with greater than 50% respiratory variability, suggesting right atrial pressure of 3 mmHg. IAS/Shunts: No atrial level shunt detected by color flow Doppler.  LEFT VENTRICLE PLAX 2D LVIDd:         3.90 cm  Diastology LVIDs:          2.10 cm  LV  e' medial:    5.18 cm/s LV PW:         1.00 cm  LV E/e' medial:  12.3 LV IVS:        1.10 cm  LV e' lateral:   8.63 cm/s LVOT diam:     1.90 cm  LV E/e' lateral: 7.4 LV SV:         63 LV SV Index:   36       2D Longitudinal Strain LVOT Area:     2.84 cm 2D Strain GLS Avg:     -19.3 %  RIGHT VENTRICLE RV Basal diam:  2.20 cm RV S prime:     10.40 cm/s TAPSE (M-mode): 2.2 cm LEFT ATRIUM             Index       RIGHT ATRIUM          Index LA diam:        3.50 cm 2.03 cm/m  RA Area:     7.27 cm LA Vol (A2C):   38.7 ml 22.42 ml/m RA Volume:   11.00 ml 6.37 ml/m LA Vol (A4C):   29.2 ml 16.92 ml/m LA Biplane Vol: 34.2 ml 19.81 ml/m  AORTIC VALVE LVOT Vmax:   115.00 cm/s LVOT Vmean:  70.200 cm/s LVOT VTI:    0.221 m  AORTA Ao Root diam: 2.70 cm MITRAL VALVE MV Area (PHT): 3.78 cm    SHUNTS MV Decel Time: 201 msec    Systemic VTI:  0.22 m MV E velocity: 63.70 cm/s  Systemic Diam: 1.90 cm MV A velocity: 70.27 cm/s MV E/A ratio:  0.91 Cherlynn Kaiser MD Electronically signed by Cherlynn Kaiser MD Signature Date/Time: 08/14/2020/1:44:13 PM    Final       IMPRESSION/PLAN: Left breast cancer  It was a pleasure meeting the patient today. We discussed the risks, benefits, and side effects of radiotherapy. I recommend radiotherapy to the left breast to reduce her risk of locoregional recurrence by 2/3.  We discussed that radiation would take approximately 6 weeks to complete and that I recommend standard fractionation over this period of time since she will be receiving concurrent chemotherapy.  I conferred with her medical oncologist who will be holding her methotrexate from her CMF while she is undergoing radiation therapy.  She will proceed with CT simulation this week and start treatment next week.  We discussed adjuvant radiotherapy today.  I reviewed the logistics, benefits, risks, and potential side effects of this treatment in detail. Risks may include but not necessary be limited to acute and late  injury tissue in the radiation fields such as skin irritation (change in color/pigmentation, itching, dryness, pain, peeling). She may experience fatigue. We also discussed possible risk of long term cosmetic changes or scar tissue. There is also a smaller risk for lung toxicity, cardiac toxicity, brachial plexopathy, lymphedema, musculoskeletal changes, rib fragility or induction of a second malignancy, late chronic non-healing soft tissue wound.    The patient asked good questions which I answered to her satisfaction. She is enthusiastic about proceeding with treatment. A consent form has been  signed and placed in her chart.  On date of service, in total, I spent 30 minutes on this encounter. Patient was seen in person.   __________________________________________   Eppie Gibson, MD  This document serves as a record of services personally performed by Eppie Gibson, MD. It was created on his behalf by Clerance Lav, a trained medical scribe. The creation  of this record is based on the scribe's personal observations and the provider's statements to them. This document has been checked and approved by the attending provider.

## 2020-09-01 ENCOUNTER — Ambulatory Visit
Admission: RE | Admit: 2020-09-01 | Discharge: 2020-09-01 | Disposition: A | Discharge status: Home / Self Care | Payer: Medicare PPO | Source: Ambulatory Visit | Attending: Radiation Oncology | Admitting: Radiation Oncology

## 2020-09-01 ENCOUNTER — Other Ambulatory Visit: Payer: Self-pay

## 2020-09-01 ENCOUNTER — Encounter: Payer: Self-pay | Admitting: Radiation Oncology

## 2020-09-01 DIAGNOSIS — Z510 Encounter for antineoplastic radiation therapy: Secondary | ICD-10-CM | POA: Insufficient documentation

## 2020-09-01 DIAGNOSIS — C50212 Malignant neoplasm of upper-inner quadrant of left female breast: Secondary | ICD-10-CM | POA: Insufficient documentation

## 2020-09-01 DIAGNOSIS — Z170 Estrogen receptor positive status [ER+]: Secondary | ICD-10-CM | POA: Diagnosis not present

## 2020-09-02 ENCOUNTER — Ambulatory Visit: Payer: Medicare PPO

## 2020-09-02 DIAGNOSIS — R293 Abnormal posture: Secondary | ICD-10-CM

## 2020-09-02 DIAGNOSIS — C50412 Malignant neoplasm of upper-outer quadrant of left female breast: Secondary | ICD-10-CM | POA: Diagnosis not present

## 2020-09-02 DIAGNOSIS — R609 Edema, unspecified: Secondary | ICD-10-CM | POA: Diagnosis not present

## 2020-09-02 NOTE — Therapy (Signed)
[place], [place], [address] Phone: [phone]   Fax:  [phone]  Physical Therapy Treatment  Patient Details  Name: April [name] MRN: [id number] Date of Birth: 15-Aug-1950 Referring Provider (PT): Dr. Donne Hazel   Encounter Date: 09/02/2020   PT End of Session - 09/02/20 1555    Visit Number 9    Number of Visits 15   Date for PT Re-Evaluation 10/14/20    PT Start Time 1505    PT Stop Time 1545    PT Time Calculation (min) 40 min    Activity Tolerance Patient tolerated treatment well    Behavior During Therapy [facility] for tasks assessed/performed           Past Medical History:  Diagnosis Date  . Breast cancer ([place]) 05/2017   right breast  . Eczema   . Family history of breast cancer   . Family history of ovarian cancer   . History of radiation therapy 10/17/17- 11/14/17   40.05 directed to the right breast in 15 fractions, followed by a boost of 10 gy given in 5 fractions.   . Hypertension    toxemia during pregnancy, no meds now    Past Surgical History:  Procedure Laterality Date  . ABDOMINAL HYSTERECTOMY    . BREAST LUMPECTOMY WITH RADIOACTIVE SEED AND SENTINEL LYMPH NODE BIOPSY Right 07/17/2017   Procedure: BREAST LUMPECTOMY WITH RADIOACTIVE SEED AND SENTINEL LYMPH NODE BIOPSY;  Surgeon: Rolm Bookbinder, MD;  Location: [place];  Service: General;  Laterality: Right;  . BREAST LUMPECTOMY WITH RADIOACTIVE SEED AND SENTINEL LYMPH NODE BIOPSY Left 07/13/2020   Procedure: LEFT BREAST LUMPECTOMY WITH RADIOACTIVE SEED AND LEFT AXILLARY SENTINEL LYMPH NODE BIOPSY;  Surgeon: Rolm Bookbinder, MD;  Location: [facility];  Service: General;  Laterality: Left;  . CESAREAN SECTION     x4  . DILATION AND CURETTAGE OF UTERUS    . KNEE SURGERY Right   . PORT-A-CATH REMOVAL Right 09/04/2018   Procedure: REMOVAL PORT-A-CATH;  Surgeon: Rolm Bookbinder, MD;   Location: [facility];  Service: General;  Laterality: Right;  . PORTACATH PLACEMENT N/A 07/17/2017   Procedure: INSERTION PORT-A-CATH WITH [place];  Surgeon: Rolm Bookbinder, MD;  Location: [facility];  Service: General;  Laterality: N/A;  . PORTACATH PLACEMENT Right 07/13/2020   Procedure: INSERTION PORT-A-CATH WITH ULTRASOUND GUIDANCE;  Surgeon: Rolm Bookbinder, MD;  Location: [place];  Service: General;  Laterality: Right;    There were no vitals filed for this visit.   Subjective Assessment - 09/02/20 1459    Subjective Have been feeling really good.  No longer have breast heaviness anymore.  Have been doing MLD.Area around the areola is less firm also. Went for the radiation simulation on Tuesday and since its on the left I have to do a lower dosage for a longer time. no shoulder limitations that I am aware of .  I have received the bra and have it on today.  The sleeve just came and my husband brought it to me.    Pertinent History Pt with prior history of right breast cancer  with  with surgery on 07/17/17 for right lumpectomy and SLNB with 5 LN removed.  Had chemo and radiation. Most recent surgery1/25/22 for left breast lumpectomy with 0/5 positive nodes. She will have chemo, radiation and infusions for immunotherapy.    Patient Stated Goals Want to decrease tightness in axilla and knot in  incision region., decrease swelling in left breast    Currently in Pain? No/denies              [facility] PT Assessment - 09/02/20 0001      AROM   Left Shoulder Extension 54 Degrees    Left Shoulder Flexion 165 Degrees    Left Shoulder ABduction 180 Degrees             LYMPHEDEMA/ONCOLOGY QUESTIONNAIRE - 09/02/20 0001      Right Upper Extremity Lymphedema   15 cm Proximal to Olecranon Process 33.2 cm    10 cm Proximal to Olecranon Process 30.2 cm    Olecranon Process 25 cm    15 cm Proximal to Ulnar Styloid Process 22.5 cm    10 cm Proximal  to Ulnar Styloid Process 18.2 cm    Just Proximal to Ulnar Styloid Process 14.3 cm    Across Hand at [employer] 18.1 cm    At [place] of 2nd Digit 5.9 cm      Left Upper Extremity Lymphedema   15 cm Proximal to Olecranon Process 32.1 cm    10 cm Proximal to Olecranon Process 29.5 cm    Olecranon Process 24.9 cm    15 cm Proximal to Ulnar Styloid Process 20.9 cm    10 cm Proximal to Ulnar Styloid Process 18.2 cm    Just Proximal to Ulnar Styloid Process 13.8 cm    Across Hand at [employer] 17.1 cm    At [place] of 2nd Digit 5.6 cm                      [facility] Adult PT Treatment/Exercise - 09/02/20 0001      Shoulder Exercises: Pulleys   Flexion 2 minutes    ABduction 2 minutes                       PT Long Term Goals - 09/02/20 1522      PT LONG TERM GOAL #1   Title Patient will demonstrate she has returned to baseline since surgery related to shoulder ROM and function.    Time 4    Period Weeks    Status Achieved      PT LONG TERM GOAL #2   Title Pt will be independent in left breast MLD    Time 3    Period Weeks    Status Achieved      PT LONG TERM GOAL #3   Title Pt will report decreased breast heaviness by 50% or greater    Baseline none at all    Time 4    Period Weeks    Status Achieved      PT LONG TERM GOAL #4   Title Pt will be independent with scar massage    Time 2    Period Weeks    Status Achieved      PT LONG TERM GOAL #5   Title Pt will be fit for compression sleeve and will be independent in its use    Baseline received today but has not worn yet.    Time 2    Period Weeks    Status On-going      Additional Long Term Goals   Additional Long Term Goals Yes      PT LONG TERM GOAL #6   Title Pt will maintain left shoulder ROM throughout radiation    Time 6  Period Weeks    Status New    Target Date 10/14/20                 Plan - 09/02/20 1556    Clinical Impression Statement Pt has achieved all  goals established at initial evaluation except she hasn't tried wearing her sleeve yet. She brought in her sleeve and gauntlet/ and wore her bra and both appear to fit well.    Her shoulder ROM is WNL and she no longer has breast heaviness.  She is independent and compliant with HEP and self MLD.   Her left breast incisions are soft without increased fibrosis .There is no significant difference in circumference of the arms.  She is due to begin radiation next week and we will extend her dates so that she may check in in 2-3 weeks to be sure she is not developing any swelling, or limitations in ROM.    Personal Factors and Comorbidities Comorbidity 2    Comorbidities Hx of bilateral breast CA with new onset of left Breast CA. ( right in 2019), lymphedema risk    Stability/Clinical Decision Making Stable/Uncomplicated    Rehab Potential Excellent    PT Frequency 1x / week    PT Duration 6 weeks    PT Treatment/Interventions ADLs/Self Care Home Management;Therapeutic exercise;Patient/family education;Manual techniques;Manual lymph drainage;Scar mobilization;Passive range of motion    PT Next Visit Plan Reassess shoulder ROM, breast swelling as pt goes through radiation    PT Home Exercise Plan 4 post op exercises, ABC class, Supine scapular stabs x 10 with yellow Therabandm MLD to left breast    Consulted and Agree with Plan of Care Patient           Patient will benefit from skilled therapeutic intervention in order to improve the following deficits and impairments:  Postural dysfunction,Decreased knowledge of precautions,Decreased scar mobility,Increased edema,Decreased range of motion,Decreased strength  Visit Diagnosis: Abnormal posture  Edema, unspecified type  Malignant neoplasm of upper-outer quadrant of left female breast, unspecified estrogen receptor status ([place])     Problem List Patient Active Problem List   Diagnosis Date Noted  . Neuropathy due to chemotherapeutic drug ([place])  07/23/2018  . Port-A-Cath in place 08/28/2017  . TIA (transient ischemic attack) 08/09/2017  . Hypertension   . Genetic testing 07/06/2017  . Family history of ovarian cancer   . Family history of breast cancer   . Malignant neoplasm of upper-outer quadrant of right breast in female, estrogen receptor negative ([place]) 06/26/2017  . Malignant neoplasm of left breast, estrogen receptor positive ([place]) 05/19/2017    Claris Pong 09/02/2020, 5:11 PM  [place], [place], [address] Phone: [phone]   Fax:  [phone]  Name: MAITLAND MUHLBAUER MRN: [id number] Date of Birth: 1950/10/11 Cheral Almas, PT 09/02/20 5:13 PM

## 2020-09-02 NOTE — Addendum Note (Signed)
Addended by: Claris Pong on: 09/02/2020 10:09 PM   Modules accepted: Orders

## 2020-09-03 ENCOUNTER — Other Ambulatory Visit: Payer: Self-pay

## 2020-09-03 DIAGNOSIS — Z171 Estrogen receptor negative status [ER-]: Secondary | ICD-10-CM

## 2020-09-07 ENCOUNTER — Other Ambulatory Visit: Payer: Self-pay

## 2020-09-07 ENCOUNTER — Inpatient Hospital Stay (HOSPITAL_BASED_OUTPATIENT_CLINIC_OR_DEPARTMENT_OTHER): Payer: Medicare PPO | Admitting: Medical

## 2020-09-07 ENCOUNTER — Inpatient Hospital Stay: Payer: Medicare PPO

## 2020-09-07 VITALS — BP 178/68 | HR 76 | Temp 98.1°F | Resp 18 | Ht 63.5 in | Wt 153.0 lb

## 2020-09-07 DIAGNOSIS — C50411 Malignant neoplasm of upper-outer quadrant of right female breast: Secondary | ICD-10-CM

## 2020-09-07 DIAGNOSIS — Z95828 Presence of other vascular implants and grafts: Secondary | ICD-10-CM

## 2020-09-07 DIAGNOSIS — Z510 Encounter for antineoplastic radiation therapy: Secondary | ICD-10-CM | POA: Diagnosis not present

## 2020-09-07 DIAGNOSIS — Z171 Estrogen receptor negative status [ER-]: Secondary | ICD-10-CM | POA: Diagnosis not present

## 2020-09-07 DIAGNOSIS — Z79899 Other long term (current) drug therapy: Secondary | ICD-10-CM | POA: Diagnosis not present

## 2020-09-07 DIAGNOSIS — I10 Essential (primary) hypertension: Secondary | ICD-10-CM | POA: Diagnosis not present

## 2020-09-07 DIAGNOSIS — Z5111 Encounter for antineoplastic chemotherapy: Secondary | ICD-10-CM | POA: Diagnosis not present

## 2020-09-07 DIAGNOSIS — Z923 Personal history of irradiation: Secondary | ICD-10-CM | POA: Diagnosis not present

## 2020-09-07 DIAGNOSIS — K5900 Constipation, unspecified: Secondary | ICD-10-CM | POA: Diagnosis not present

## 2020-09-07 DIAGNOSIS — Z170 Estrogen receptor positive status [ER+]: Secondary | ICD-10-CM | POA: Diagnosis not present

## 2020-09-07 DIAGNOSIS — Z5112 Encounter for antineoplastic immunotherapy: Secondary | ICD-10-CM | POA: Diagnosis not present

## 2020-09-07 DIAGNOSIS — C50212 Malignant neoplasm of upper-inner quadrant of left female breast: Secondary | ICD-10-CM | POA: Diagnosis not present

## 2020-09-07 LAB — CBC WITH DIFFERENTIAL (CANCER CENTER ONLY)
Abs Immature Granulocytes: 0.04 10*3/uL (ref 0.00–0.07)
Basophils Absolute: 0 10*3/uL (ref 0.0–0.1)
Basophils Relative: 1 %
Eosinophils Absolute: 0.1 10*3/uL (ref 0.0–0.5)
Eosinophils Relative: 3 %
HCT: 32.8 % — ABNORMAL LOW (ref 36.0–46.0)
Hemoglobin: 10.8 g/dL — ABNORMAL LOW (ref 12.0–15.0)
Immature Granulocytes: 1 %
Lymphocytes Relative: 47 %
Lymphs Abs: 1.5 10*3/uL (ref 0.7–4.0)
MCH: 29.3 pg (ref 26.0–34.0)
MCHC: 32.9 g/dL (ref 30.0–36.0)
MCV: 89.1 fL (ref 80.0–100.0)
Monocytes Absolute: 0.4 10*3/uL (ref 0.1–1.0)
Monocytes Relative: 12 %
Neutro Abs: 1.1 10*3/uL — ABNORMAL LOW (ref 1.7–7.7)
Neutrophils Relative %: 36 %
Platelet Count: 237 10*3/uL (ref 150–400)
RBC: 3.68 MIL/uL — ABNORMAL LOW (ref 3.87–5.11)
RDW: 13.9 % (ref 11.5–15.5)
WBC Count: 3.1 10*3/uL — ABNORMAL LOW (ref 4.0–10.5)
nRBC: 0 % (ref 0.0–0.2)

## 2020-09-07 LAB — CMP (CANCER CENTER ONLY)
ALT: 27 U/L (ref 0–44)
AST: 27 U/L (ref 15–41)
Albumin: 4 g/dL (ref 3.5–5.0)
Alkaline Phosphatase: 67 U/L (ref 38–126)
Anion gap: 10 (ref 5–15)
BUN: 11 mg/dL (ref 8–23)
CO2: 27 mmol/L (ref 22–32)
Calcium: 9.8 mg/dL (ref 8.9–10.3)
Chloride: 102 mmol/L (ref 98–111)
Creatinine: 1.17 mg/dL — ABNORMAL HIGH (ref 0.44–1.00)
GFR, Estimated: 50 mL/min — ABNORMAL LOW (ref >60)
Glucose, Bld: 132 mg/dL — ABNORMAL HIGH (ref 70–99)
Potassium: 3.8 mmol/L (ref 3.5–5.1)
Sodium: 139 mmol/L (ref 135–145)
Total Bilirubin: 0.3 mg/dL (ref 0.3–1.2)
Total Protein: 7.5 g/dL (ref 6.5–8.1)

## 2020-09-07 MED ORDER — TRASTUZUMAB-ANNS CHEMO 150 MG IV SOLR
6.0000 mg/kg | Freq: Once | INTRAVENOUS | Status: AC
  Administered 2020-09-07: 420 mg via INTRAVENOUS
  Filled 2020-09-07: qty 20

## 2020-09-07 MED ORDER — SODIUM CHLORIDE 0.9% FLUSH
10.0000 mL | Freq: Once | INTRAVENOUS | Status: AC
  Administered 2020-09-07: 10 mL
  Filled 2020-09-07: qty 10

## 2020-09-07 MED ORDER — ACETAMINOPHEN 325 MG PO TABS
650.0000 mg | ORAL_TABLET | Freq: Once | ORAL | Status: AC
  Administered 2020-09-07: 650 mg via ORAL

## 2020-09-07 MED ORDER — DIPHENHYDRAMINE HCL 25 MG PO CAPS
25.0000 mg | ORAL_CAPSULE | Freq: Once | ORAL | Status: AC
  Administered 2020-09-07: 25 mg via ORAL

## 2020-09-07 MED ORDER — SODIUM CHLORIDE 0.9% FLUSH
10.0000 mL | INTRAVENOUS | Status: DC | PRN
Start: 2020-09-07 — End: 2020-09-07
  Administered 2020-09-07: 10 mL
  Filled 2020-09-07: qty 10

## 2020-09-07 MED ORDER — SODIUM CHLORIDE 0.9 % IV SOLN
10.0000 mg | Freq: Once | INTRAVENOUS | Status: AC
  Administered 2020-09-07: 10 mg via INTRAVENOUS
  Filled 2020-09-07: qty 10

## 2020-09-07 MED ORDER — SODIUM CHLORIDE 0.9 % IV SOLN
Freq: Once | INTRAVENOUS | Status: AC
  Filled 2020-09-07: qty 250

## 2020-09-07 MED ORDER — HEPARIN SOD (PORK) LOCK FLUSH 100 UNIT/ML IV SOLN
500.0000 [IU] | Freq: Once | INTRAVENOUS | Status: AC | PRN
  Administered 2020-09-07: 500 [IU]
  Filled 2020-09-07: qty 5

## 2020-09-07 MED ORDER — SODIUM CHLORIDE 0.9 % IV SOLN
600.0000 mg/m2 | Freq: Once | INTRAVENOUS | Status: AC
  Administered 2020-09-07: 1060 mg via INTRAVENOUS
  Filled 2020-09-07: qty 53

## 2020-09-07 MED ORDER — DIPHENHYDRAMINE HCL 25 MG PO CAPS
ORAL_CAPSULE | ORAL | Status: AC
  Filled 2020-09-07: qty 1

## 2020-09-07 MED ORDER — ACETAMINOPHEN 325 MG PO TABS
ORAL_TABLET | ORAL | Status: AC
  Filled 2020-09-07: qty 2

## 2020-09-07 MED ORDER — PALONOSETRON HCL INJECTION 0.25 MG/5ML
INTRAVENOUS | Status: AC
  Filled 2020-09-07: qty 5

## 2020-09-07 MED ORDER — PALONOSETRON HCL INJECTION 0.25 MG/5ML
0.2500 mg | Freq: Once | INTRAVENOUS | Status: AC
  Administered 2020-09-07: 0.25 mg via INTRAVENOUS

## 2020-09-07 MED ORDER — FLUOROURACIL CHEMO INJECTION 2.5 GM/50ML
600.0000 mg/m2 | Freq: Once | INTRAVENOUS | Status: AC
  Administered 2020-09-07: 1050 mg via INTRAVENOUS
  Filled 2020-09-07: qty 21

## 2020-09-07 NOTE — Progress Notes (Signed)
[facility] Progress Note   April [name] [id number] September 29, 1950 70 y.o.  April [name] is managed by Dr. Lurline Del  Actively treated with chemotherapy/immunotherapy/hormonal therapy: yes  Current therapy: CMF  Last treated: 08/17/2020 (cycle # 2, day #1)  Next scheduled appointment with provider: 09/28/2020  Assessment: Plan:    Malignant neoplasm of upper-outer quadrant of right breast in female, estrogen receptor negative ([place])   ER negative malignant neoplasm of the right breast: April [name] presents today for cycle #3, day #1 of CMF.  A CBC returned today with a WBC of 3.1 and an ANC of 1.1.  This was reviewed with Dr. Jana Hakim who approved proceeding with the patient's treatment today.  She will return as scheduled on 09/28/2020.  Please see After Visit Summary for patient specific instructions.  Future Appointments  Date Time Provider [place]  09/08/2020 12:00 PM Eppie Gibson, MD [facility] None  09/09/2020 12:00 PM [facility] [id number] [facility] None  09/10/2020 12:00 PM [facility] [id number] [facility] None  09/13/2020 12:00 PM [facility] LINAC 3 [facility] None  09/14/2020 12:15 PM [facility] LINAC 3 [facility] None  09/15/2020 11:00 AM [facility] LINAC 3 [facility] None  09/16/2020 12:00 PM [facility] [id number] [facility] None  09/17/2020  3:00 PM [facility] [id number] [facility] None  09/20/2020 11:45 AM [facility] [id number] [facility] None  09/21/2020  2:30 PM [facility] LINAC 3 [facility] None  09/22/2020  2:30 PM [facility] LINAC 3 [facility] None  09/23/2020 11:45 AM [facility] [id number] [facility] None  09/23/2020  3:00 PM [place], Robin J, PT [facility] None  09/24/2020 11:45 AM [facility] [id number] [facility] None  09/27/2020 11:15 AM [facility] [id number] [facility] None  09/28/2020 11:15 AM [facility] [id number] [facility] None  09/28/2020 11:45 AM [facility] LAB [facility] None  09/28/2020 12:00 PM [facility]  [place] FLUSH [facility] None  09/28/2020 12:30 PM Magrinat, Virgie Dad, MD [facility] None  09/28/2020  1:30 PM [facility] INFUSION [facility] None  09/29/2020 11:15 AM [facility] [id number] [facility] None  09/30/2020 11:15 AM [facility] [id number] [facility] None  10/01/2020 11:15 AM [facility] [id number] [facility] None  10/04/2020 11:15 AM [facility] [id number] [facility] None  10/05/2020 11:15 AM [facility] [id number] [facility] None  10/06/2020 11:15 AM [facility] [id number] [facility] None  10/07/2020 11:15 AM [facility] [id number] [facility] None  10/08/2020 11:15 AM [facility] [id number] [facility] None  10/11/2020  9:30 AM Debbe Bales, Valerie A, PTA [facility] None  10/11/2020 11:15 AM [facility] [id number] [facility] None  10/11/2020 11:30 AM Eppie Gibson, MD [facility] None  10/12/2020 11:15 AM [facility] [id number] [facility] None  10/13/2020 11:15 AM [facility] [id number] [facility] None  10/14/2020 11:15 AM [facility] [id number] [facility] None  10/15/2020 11:15 AM [facility] [id number] [facility] None  10/18/2020 11:15 AM [facility] [id number] [facility] None  10/19/2020 11:45 AM [facility] [id number] [facility] None  10/19/2020  1:15 PM [facility] LAB [facility] None  10/19/2020  1:30 PM [facility] [place] FLUSH [facility] None  10/19/2020  2:30 PM [facility] INFUSION [facility] None  11/09/2020 12:45 PM [facility] LAB [facility] None  11/09/2020  1:00 PM [facility] [place] FLUSH [facility] None  11/09/2020  1:30 PM Magrinat, Virgie Dad, MD [facility] None  11/09/2020  2:30 PM [facility] INFUSION [facility] None    No orders of the defined types were placed in this encounter.      Subjective:   Patient ID:  April [name] is a 70 y.o. (DOB 1950-11-12) female.  Chief Complaint: No chief complaint on file.   HPI April [name]  is a 70 y.o. female with a diagnosis of an ER negative malignant neoplasm of the right  breast.  She is managed by Dr. Jana Hakim and presents  today for cycle #3, day #1 of CMF.  She reports that she is doing well.  She is tolerating her treatments well with no issues of concern.  She denies fevers, chills, sweats, nausea, vomiting, constipation, or diarrhea.   Medications: I have reviewed the patient's current medications.  Allergies:  Allergies  Allergen Reactions  . Tape Itching and Rash  . Betamethasone Dipropionate Other (See Comments)  . Cetirizine Hcl Other (See Comments)  . Lisinopril Other (See Comments)  . Loratadine Other (See Comments)  . Nsaids     Stomach issue  . Other Other (See Comments)  . Statins Other (See Comments)  . Sulfa Antibiotics Rash    Past Medical History:  Diagnosis Date  . Breast cancer ([facility]) 05/2017   right breast  . Eczema   . Family history of breast cancer   . Family history of ovarian cancer   . History of radiation therapy 10/17/17- 11/14/17   40.05 directed to the right breast in 15 fractions, followed by a boost of 10 gy given in 5 fractions.   . Hypertension    toxemia during pregnancy, no meds now    Past Surgical History:  Procedure Laterality Date  . ABDOMINAL HYSTERECTOMY    . BREAST LUMPECTOMY WITH RADIOACTIVE SEED AND SENTINEL LYMPH NODE BIOPSY Right 07/17/2017   Procedure: BREAST LUMPECTOMY WITH RADIOACTIVE SEED AND SENTINEL LYMPH NODE BIOPSY;  Surgeon: Rolm Bookbinder, MD;  Location[facility];  Service: General;  Laterality: Right;  . BREAST LUMPECTOMY WITH RADIOACTIVE SEED AND SENTINEL LYMPH NODE BIOPSY Left 07/13/2020   Procedure: LEFT BREAST LUMPECTOMY WITH RADIOACTIVE SEED AND LEFT AXILLARY SENTINEL LYMPH NODE BIOPSY;  Surgeon: Rolm Bookbinder, MD;  Location: [facility];  Service: General;  Laterality: Left;  . CESAREAN SECTION     x4  . DILATION AND CURETTAGE OF UTERUS    . KNEE SURGERY Right   . PORT-A-CATH REMOVAL Right 09/04/2018   Procedure: REMOVAL PORT-A-CATH;  Surgeon: Rolm Bookbinder, MD;  Location: [facility] [place];  Service: General;  Laterality: Right;  . PORTACATH PLACEMENT N/A 07/17/2017   Procedure: INSERTION PORT-A-CATH WITH [place];  Surgeon: Rolm Bookbinder, MD;  Location: [place];  Service: General;  Laterality: N/A;  . PORTACATH PLACEMENT Right 07/13/2020   Procedure: INSERTION PORT-A-CATH WITH ULTRASOUND GUIDANCE;  Surgeon: Rolm Bookbinder, MD;  Location: [place];  Service: General;  Laterality: Right;    Family History  Problem Relation Age of Onset  . Breast cancer Mother 18       again at 36 in other breast   . Heart attack Father 60  . Breast cancer Sister 104  . Breast cancer Maternal Grandmother 26       spread to lungs, died at 22  . Ovarian cancer Cousin 46  . Prostate cancer Cousin     Social History   Socioeconomic History  . Marital status: Married    Spouse name: Not on file  . Number of children: Not on file  . Years of education: Not on file  . Highest education level: Not on file  Occupational History  . Not on file  Tobacco Use  . Smoking status: Never Smoker  . Smokeless tobacco: Never Used  Vaping Use  . Vaping Use: Never used  Substance and Sexual Activity  . Alcohol use: Yes    Comment: social  . Drug use: No  .  Sexual activity: Not Currently  Other Topics Concern  . Not on file  Social History Narrative  . Not on file   Social Determinants of Health   Financial Resource Strain: Not on file  Food Insecurity: Not on file  Transportation Needs: Not on file  Physical Activity: Not on file  Stress: Not on file  Social Connections: Not on file  Intimate Partner Violence: Not on file    Past Medical History, Surgical history, Social history, and Family history were reviewed and updated as appropriate.   Please see review of systems for further details on the patient's review from today.   Review of Systems:  Review of Systems  Constitutional: Negative for chills, diaphoresis and fever.  HENT:  Negative for trouble swallowing and voice change.   Respiratory: Negative for cough, chest tightness, shortness of breath and wheezing.   Cardiovascular: Negative for chest pain and palpitations.  Gastrointestinal: Negative for abdominal pain, constipation, diarrhea, nausea and vomiting.  Musculoskeletal: Negative for back pain and myalgias.  Neurological: Negative for dizziness, light-headedness and headaches.    Objective:   Physical Exam:  There were no vitals taken for this visit. ECOG: 0  Physical Exam Constitutional:      General: She is not in acute distress.    Appearance: She is not diaphoretic.  HENT:     Head: Normocephalic and atraumatic.  Eyes:     General: No scleral icterus.       Right eye: No discharge.        Left eye: No discharge.     Conjunctiva/sclera: Conjunctivae normal.  Cardiovascular:     Rate and Rhythm: Normal rate and regular rhythm.     Heart sounds: Normal heart sounds. No murmur heard. No friction rub. No gallop.   Pulmonary:     Effort: Pulmonary effort is normal. No respiratory distress.     Breath sounds: Normal breath sounds. No wheezing or rales.  Skin:    General: Skin is warm and dry.     Findings: No erythema or rash.  Neurological:     Mental Status: She is alert.  Psychiatric:        Mood and Affect: Mood normal.        Behavior: Behavior normal.        Thought Content: Thought content normal.        Judgment: Judgment normal.     Lab Review:     Component Value Date/Time   NA 139 09/07/2020 1308   K 3.8 09/07/2020 1308   CL 102 09/07/2020 1308   CO2 27 09/07/2020 1308   GLUCOSE 132 (H) 09/07/2020 1308   BUN 11 09/07/2020 1308   CREATININE 1.17 (H) 09/07/2020 1308   CALCIUM 9.8 09/07/2020 1308   PROT 7.5 09/07/2020 1308   ALBUMIN 4.0 09/07/2020 1308   AST 27 09/07/2020 1308   ALT 27 09/07/2020 1308   ALKPHOS 67 09/07/2020 1308   BILITOT 0.3 09/07/2020 1308   GFRNONAA 50 (L) 09/07/2020 1308   GFRAA >60 07/10/2019  1008   GFRAA >60 06/27/2017 0843       Component Value Date/Time   WBC 3.1 (L) 09/07/2020 1308   WBC 4.9 06/29/2020 1318   RBC 3.68 (L) 09/07/2020 1308   HGB 10.8 (L) 09/07/2020 1308   HCT 32.8 (L) 09/07/2020 1308   PLT 237 09/07/2020 1308   MCV 89.1 09/07/2020 1308   MCH 29.3 09/07/2020 1308   MCHC 32.9 09/07/2020 1308   RDW 13.9  09/07/2020 1308   LYMPHSABS 1.5 09/07/2020 1308   MONOABS 0.4 09/07/2020 1308   EOSABS 0.1 09/07/2020 1308   BASOSABS 0.0 09/07/2020 1308   -------------------------------  Imaging from last 24 hours (if applicable):  Radiology interpretation: ECHOCARDIOGRAM COMPLETE  Result Date: 08/14/2020    ECHOCARDIOGRAM REPORT   Patient Name:   April [name] Date of Exam: 08/13/2020 Medical Rec #:  [id number]           Height:       63.5 in Accession #:    [id number]          Weight:       151.0 lb Date of Birth:  1950-10-13           BSA:          1.726 m Patient Age:    85 years            BP:           161/81 mmHg Patient Gender: F                   HR:           95 bpm. Exam Location:  [facility] Procedure: 2D Echo, Cardiac Doppler and Color Doppler Indications:    Z09 Chemotherapy  History:        Patient has prior history of Echocardiogram examinations, most                 recent 05/28/2018. TIA; Risk Factors:Hypertension. Breast                 cancer.  Sonographer:    Diamond Nickel RCS Referring Phys: [place] Comments: Attempted 3D. IMPRESSIONS  1. Left ventricular ejection fraction, by estimation, is 65 to 70%. The left ventricle has normal function. The left ventricle has no regional wall motion abnormalities. There is mild asymmetric left ventricular hypertrophy of the septal segment. Left ventricular diastolic parameters are consistent with Grade I diastolic dysfunction (impaired relaxation). The average left ventricular global longitudinal strain is -19.3 %. The global longitudinal strain is normal.  2. Right ventricular  systolic function is normal. The right ventricular size is normal. Tricuspid regurgitation signal is inadequate for assessing PA pressure.  3. The mitral valve is normal in structure. Trivial mitral valve regurgitation. No evidence of mitral stenosis.  4. The aortic valve is grossly normal. Aortic valve regurgitation is not visualized. No aortic stenosis is present.  5. The inferior vena cava is normal in size with greater than 50% respiratory variability, suggesting right atrial pressure of 3 mmHg. FINDINGS  Left Ventricle: Left ventricular ejection fraction, by estimation, is 65 to 70%. The left ventricle has normal function. The left ventricle has no regional wall motion abnormalities. The average left ventricular global longitudinal strain is -19.3 %. The global longitudinal strain is normal. The left ventricular internal cavity size was normal in size. There is mild asymmetric left ventricular hypertrophy of the septal segment. Left ventricular diastolic parameters are consistent with Grade I diastolic dysfunction (impaired relaxation). Right Ventricle: The right ventricular size is normal. No increase in right ventricular wall thickness. Right ventricular systolic function is normal. Tricuspid regurgitation signal is inadequate for assessing PA pressure. Left Atrium: Left atrial size was normal in size. Right Atrium: Right atrial size was normal in size. Pericardium: There is no evidence of pericardial effusion. Mitral Valve: The mitral valve is normal in structure. Trivial mitral valve regurgitation. No evidence  of mitral valve stenosis. Tricuspid Valve: The tricuspid valve is normal in structure. Tricuspid valve regurgitation is not demonstrated. No evidence of tricuspid stenosis. Aortic Valve: The aortic valve is grossly normal. Aortic valve regurgitation is not visualized. No aortic stenosis is present. Pulmonic Valve: The pulmonic valve was normal in structure. Pulmonic valve regurgitation is not  visualized. No evidence of pulmonic stenosis. Aorta: The aortic root is normal in size and structure. Venous: The inferior vena cava is normal in size with greater than 50% respiratory variability, suggesting right atrial pressure of 3 mmHg. IAS/Shunts: No atrial level shunt detected by color flow Doppler.  LEFT VENTRICLE PLAX 2D LVIDd:         3.90 cm  Diastology LVIDs:         2.10 cm  LV e' medial:    5.18 cm/s LV PW:         1.00 cm  LV E/e' medial:  12.3 LV IVS:        1.10 cm  LV e' lateral:   8.63 cm/s LVOT diam:     1.90 cm  LV E/e' lateral: 7.4 LV SV:         63 LV SV Index:   36       2D Longitudinal Strain LVOT Area:     2.84 cm 2D Strain GLS Avg:     -19.3 %  RIGHT VENTRICLE RV Basal diam:  2.20 cm RV S prime:     10.40 cm/s TAPSE (M-mode): 2.2 cm LEFT ATRIUM             Index       RIGHT ATRIUM          Index LA diam:        3.50 cm 2.03 cm/m  RA Area:     7.27 cm LA Vol (A2C):   38.7 ml 22.42 ml/m RA Volume:   11.00 ml 6.37 ml/m LA Vol (A4C):   29.2 ml 16.92 ml/m LA Biplane Vol: 34.2 ml 19.81 ml/m  AORTIC VALVE LVOT Vmax:   115.00 cm/s LVOT Vmean:  70.200 cm/s LVOT VTI:    0.221 m  AORTA Ao Root diam: 2.70 cm MITRAL VALVE MV Area (PHT): 3.78 cm    SHUNTS MV Decel Time: 201 msec    Systemic VTI:  0.22 m MV E velocity: 63.70 cm/s  Systemic Diam: 1.90 cm MV A velocity: 70.27 cm/s MV E/A ratio:  0.91 Cherlynn Kaiser MD Electronically signed by Cherlynn Kaiser MD Signature Date/Time: 08/14/2020/1:44:13 PM    Final

## 2020-09-07 NOTE — Patient Instructions (Signed)
[facility] Discharge Instructions for Patients Receiving Chemotherapy  Today you received the following chemotherapy agents trastuzumab, cytoxan, fluorourcil  To help prevent nausea and vomiting after your treatment, we encourage you to take your nausea medication as directed.  If you develop nausea and vomiting that is not controlled by your nausea medication, call the clinic.   BELOW ARE SYMPTOMS THAT SHOULD BE REPORTED IMMEDIATELY:  *FEVER GREATER THAN 100.5 F  *CHILLS WITH OR WITHOUT FEVER  NAUSEA AND VOMITING THAT IS NOT CONTROLLED WITH YOUR NAUSEA MEDICATION  *UNUSUAL SHORTNESS OF BREATH  *UNUSUAL BRUISING OR BLEEDING  TENDERNESS IN MOUTH AND THROAT WITH OR WITHOUT PRESENCE OF ULCERS  *URINARY PROBLEMS  *BOWEL PROBLEMS  UNUSUAL RASH Items with * indicate a potential emergency and should be followed up as soon as possible.  Feel free to call the clinic should you have any questions or concerns. The clinic phone number is [phone].  Please show the [place] at check-in to the Emergency Department and triage nurse.

## 2020-09-07 NOTE — Progress Notes (Signed)
Per Dr. Jana Hakim, ok to treat with ANC 1.1

## 2020-09-08 ENCOUNTER — Encounter: Payer: Self-pay | Admitting: Medical

## 2020-09-08 ENCOUNTER — Other Ambulatory Visit: Payer: Self-pay

## 2020-09-08 ENCOUNTER — Ambulatory Visit: Payer: Medicare PPO | Admitting: Radiation Oncology

## 2020-09-08 DIAGNOSIS — E559 Vitamin D deficiency, unspecified: Secondary | ICD-10-CM | POA: Insufficient documentation

## 2020-09-08 DIAGNOSIS — Z853 Personal history of malignant neoplasm of breast: Secondary | ICD-10-CM | POA: Insufficient documentation

## 2020-09-08 DIAGNOSIS — J309 Allergic rhinitis, unspecified: Secondary | ICD-10-CM | POA: Insufficient documentation

## 2020-09-08 DIAGNOSIS — E7800 Pure hypercholesterolemia, unspecified: Secondary | ICD-10-CM | POA: Insufficient documentation

## 2020-09-08 DIAGNOSIS — R002 Palpitations: Secondary | ICD-10-CM | POA: Insufficient documentation

## 2020-09-08 DIAGNOSIS — Z8673 Personal history of transient ischemic attack (TIA), and cerebral infarction without residual deficits: Secondary | ICD-10-CM | POA: Insufficient documentation

## 2020-09-08 DIAGNOSIS — M810 Age-related osteoporosis without current pathological fracture: Secondary | ICD-10-CM | POA: Insufficient documentation

## 2020-09-09 ENCOUNTER — Other Ambulatory Visit: Payer: Self-pay

## 2020-09-09 ENCOUNTER — Ambulatory Visit
Admission: RE | Admit: 2020-09-09 | Discharge: 2020-09-09 | Disposition: A | Discharge status: Home / Self Care | Payer: Medicare PPO | Source: Ambulatory Visit | Attending: Radiation Oncology | Admitting: Radiation Oncology

## 2020-09-09 DIAGNOSIS — Z510 Encounter for antineoplastic radiation therapy: Secondary | ICD-10-CM | POA: Diagnosis not present

## 2020-09-09 DIAGNOSIS — Z170 Estrogen receptor positive status [ER+]: Secondary | ICD-10-CM | POA: Diagnosis not present

## 2020-09-09 DIAGNOSIS — C50212 Malignant neoplasm of upper-inner quadrant of left female breast: Secondary | ICD-10-CM | POA: Diagnosis not present

## 2020-09-10 ENCOUNTER — Ambulatory Visit
Admission: RE | Admit: 2020-09-10 | Discharge: 2020-09-10 | Disposition: A | Discharge status: Home / Self Care | Payer: Medicare PPO | Source: Ambulatory Visit | Attending: Radiation Oncology | Admitting: Radiation Oncology

## 2020-09-10 DIAGNOSIS — Z170 Estrogen receptor positive status [ER+]: Secondary | ICD-10-CM | POA: Diagnosis not present

## 2020-09-10 DIAGNOSIS — C50212 Malignant neoplasm of upper-inner quadrant of left female breast: Secondary | ICD-10-CM | POA: Diagnosis not present

## 2020-09-10 DIAGNOSIS — Z510 Encounter for antineoplastic radiation therapy: Secondary | ICD-10-CM | POA: Diagnosis not present

## 2020-09-13 ENCOUNTER — Ambulatory Visit
Admission: RE | Admit: 2020-09-13 | Discharge: 2020-09-13 | Disposition: A | Discharge status: Home / Self Care | Payer: Medicare PPO | Source: Ambulatory Visit | Attending: Radiation Oncology | Admitting: Radiation Oncology

## 2020-09-13 ENCOUNTER — Other Ambulatory Visit: Payer: Self-pay

## 2020-09-13 DIAGNOSIS — Z170 Estrogen receptor positive status [ER+]: Secondary | ICD-10-CM | POA: Diagnosis not present

## 2020-09-13 DIAGNOSIS — C50212 Malignant neoplasm of upper-inner quadrant of left female breast: Secondary | ICD-10-CM

## 2020-09-13 DIAGNOSIS — Z510 Encounter for antineoplastic radiation therapy: Secondary | ICD-10-CM | POA: Diagnosis not present

## 2020-09-13 MED ORDER — ALRA NON-METALLIC DEODORANT (RAD-ONC)
1.0000 "application " | Freq: Once | TOPICAL | Status: AC
  Administered 2020-09-13: 1 via TOPICAL

## 2020-09-13 MED ORDER — RADIAPLEXRX EX GEL: Freq: Once | CUTANEOUS | Status: AC

## 2020-09-13 NOTE — Progress Notes (Signed)

## 2020-09-14 ENCOUNTER — Ambulatory Visit
Admission: RE | Admit: 2020-09-14 | Discharge: 2020-09-14 | Disposition: A | Discharge status: Home / Self Care | Payer: Medicare PPO | Source: Ambulatory Visit | Attending: Radiation Oncology | Admitting: Radiation Oncology

## 2020-09-14 DIAGNOSIS — C50212 Malignant neoplasm of upper-inner quadrant of left female breast: Secondary | ICD-10-CM | POA: Diagnosis not present

## 2020-09-14 DIAGNOSIS — Z510 Encounter for antineoplastic radiation therapy: Secondary | ICD-10-CM | POA: Diagnosis not present

## 2020-09-14 DIAGNOSIS — Z170 Estrogen receptor positive status [ER+]: Secondary | ICD-10-CM | POA: Diagnosis not present

## 2020-09-15 ENCOUNTER — Other Ambulatory Visit: Payer: Self-pay

## 2020-09-15 ENCOUNTER — Ambulatory Visit
Admission: RE | Admit: 2020-09-15 | Discharge: 2020-09-15 | Disposition: A | Discharge status: Home / Self Care | Payer: Medicare PPO | Source: Ambulatory Visit | Attending: Radiation Oncology | Admitting: Radiation Oncology

## 2020-09-15 DIAGNOSIS — Z510 Encounter for antineoplastic radiation therapy: Secondary | ICD-10-CM | POA: Diagnosis not present

## 2020-09-15 DIAGNOSIS — Z170 Estrogen receptor positive status [ER+]: Secondary | ICD-10-CM | POA: Diagnosis not present

## 2020-09-15 DIAGNOSIS — C50212 Malignant neoplasm of upper-inner quadrant of left female breast: Secondary | ICD-10-CM | POA: Diagnosis not present

## 2020-09-16 ENCOUNTER — Ambulatory Visit
Admission: RE | Admit: 2020-09-16 | Discharge: 2020-09-16 | Disposition: A | Discharge status: Home / Self Care | Payer: Medicare PPO | Source: Ambulatory Visit | Attending: Radiation Oncology | Admitting: Radiation Oncology

## 2020-09-16 DIAGNOSIS — C50212 Malignant neoplasm of upper-inner quadrant of left female breast: Secondary | ICD-10-CM | POA: Diagnosis not present

## 2020-09-16 DIAGNOSIS — Z170 Estrogen receptor positive status [ER+]: Secondary | ICD-10-CM | POA: Diagnosis not present

## 2020-09-16 DIAGNOSIS — Z510 Encounter for antineoplastic radiation therapy: Secondary | ICD-10-CM | POA: Diagnosis not present

## 2020-09-17 ENCOUNTER — Ambulatory Visit
Admission: RE | Admit: 2020-09-17 | Discharge: 2020-09-17 | Disposition: A | Discharge status: Home / Self Care | Payer: Medicare PPO | Source: Ambulatory Visit | Attending: Radiation Oncology | Admitting: Radiation Oncology

## 2020-09-17 ENCOUNTER — Other Ambulatory Visit: Payer: Self-pay

## 2020-09-17 DIAGNOSIS — C50012 Malignant neoplasm of nipple and areola, left female breast: Secondary | ICD-10-CM | POA: Insufficient documentation

## 2020-09-17 DIAGNOSIS — Z170 Estrogen receptor positive status [ER+]: Secondary | ICD-10-CM | POA: Insufficient documentation

## 2020-09-17 DIAGNOSIS — C50212 Malignant neoplasm of upper-inner quadrant of left female breast: Secondary | ICD-10-CM | POA: Diagnosis not present

## 2020-09-20 ENCOUNTER — Other Ambulatory Visit: Payer: Self-pay

## 2020-09-20 ENCOUNTER — Ambulatory Visit
Admission: RE | Admit: 2020-09-20 | Discharge: 2020-09-20 | Disposition: A | Discharge status: Home / Self Care | Payer: Medicare PPO | Source: Ambulatory Visit | Attending: Radiation Oncology | Admitting: Radiation Oncology

## 2020-09-20 DIAGNOSIS — H40013 Open angle with borderline findings, low risk, bilateral: Secondary | ICD-10-CM | POA: Diagnosis not present

## 2020-09-20 DIAGNOSIS — C50212 Malignant neoplasm of upper-inner quadrant of left female breast: Secondary | ICD-10-CM | POA: Diagnosis not present

## 2020-09-20 DIAGNOSIS — Z170 Estrogen receptor positive status [ER+]: Secondary | ICD-10-CM | POA: Diagnosis not present

## 2020-09-20 DIAGNOSIS — C50012 Malignant neoplasm of nipple and areola, left female breast: Secondary | ICD-10-CM | POA: Diagnosis not present

## 2020-09-21 ENCOUNTER — Ambulatory Visit
Admission: RE | Admit: 2020-09-21 | Discharge: 2020-09-21 | Disposition: A | Discharge status: Home / Self Care | Payer: Medicare PPO | Source: Ambulatory Visit | Attending: Radiation Oncology | Admitting: Radiation Oncology

## 2020-09-21 DIAGNOSIS — C50012 Malignant neoplasm of nipple and areola, left female breast: Secondary | ICD-10-CM | POA: Diagnosis not present

## 2020-09-21 DIAGNOSIS — Z170 Estrogen receptor positive status [ER+]: Secondary | ICD-10-CM | POA: Diagnosis not present

## 2020-09-21 DIAGNOSIS — C50212 Malignant neoplasm of upper-inner quadrant of left female breast: Secondary | ICD-10-CM | POA: Diagnosis not present

## 2020-09-22 ENCOUNTER — Other Ambulatory Visit: Payer: Self-pay

## 2020-09-22 ENCOUNTER — Ambulatory Visit
Admission: RE | Admit: 2020-09-22 | Discharge: 2020-09-22 | Disposition: A | Discharge status: Home / Self Care | Payer: Medicare PPO | Source: Ambulatory Visit | Attending: Radiation Oncology | Admitting: Radiation Oncology

## 2020-09-22 DIAGNOSIS — C50012 Malignant neoplasm of nipple and areola, left female breast: Secondary | ICD-10-CM | POA: Diagnosis not present

## 2020-09-22 DIAGNOSIS — Z170 Estrogen receptor positive status [ER+]: Secondary | ICD-10-CM | POA: Diagnosis not present

## 2020-09-22 DIAGNOSIS — H16223 Keratoconjunctivitis sicca, not specified as Sjogren's, bilateral: Secondary | ICD-10-CM | POA: Diagnosis not present

## 2020-09-22 DIAGNOSIS — C50212 Malignant neoplasm of upper-inner quadrant of left female breast: Secondary | ICD-10-CM | POA: Diagnosis not present

## 2020-09-22 DIAGNOSIS — H40013 Open angle with borderline findings, low risk, bilateral: Secondary | ICD-10-CM | POA: Diagnosis not present

## 2020-09-23 ENCOUNTER — Ambulatory Visit
Admission: RE | Admit: 2020-09-23 | Discharge: 2020-09-23 | Disposition: A | Discharge status: Home / Self Care | Payer: Medicare PPO | Source: Ambulatory Visit | Attending: Radiation Oncology | Admitting: Radiation Oncology

## 2020-09-23 ENCOUNTER — Ambulatory Visit: Payer: Medicare PPO | Attending: Oncology

## 2020-09-23 ENCOUNTER — Other Ambulatory Visit: Payer: Self-pay

## 2020-09-23 DIAGNOSIS — R293 Abnormal posture: Secondary | ICD-10-CM

## 2020-09-23 DIAGNOSIS — C50212 Malignant neoplasm of upper-inner quadrant of left female breast: Secondary | ICD-10-CM | POA: Diagnosis not present

## 2020-09-23 DIAGNOSIS — R609 Edema, unspecified: Secondary | ICD-10-CM

## 2020-09-23 DIAGNOSIS — C50412 Malignant neoplasm of upper-outer quadrant of left female breast: Secondary | ICD-10-CM | POA: Diagnosis not present

## 2020-09-23 DIAGNOSIS — Z170 Estrogen receptor positive status [ER+]: Secondary | ICD-10-CM | POA: Diagnosis not present

## 2020-09-23 DIAGNOSIS — C50012 Malignant neoplasm of nipple and areola, left female breast: Secondary | ICD-10-CM | POA: Diagnosis not present

## 2020-09-23 NOTE — Therapy (Signed)
[place], [place], [address] Phone: [phone]   Fax:  [phone]  Physical Therapy Treatment  Patient Details  Name: April [name] MRN: [id number] Date of Birth: [birth year] Referring Provider (PT): Dr. Donne Hazel   Encounter Date: 09/23/2020   PT End of Session - 09/23/20 1540    Visit Number 10    Number of Visits 15    Date for PT Re-Evaluation 10/14/20    PT Start Time 1503    PT Stop Time 1540    PT Time Calculation (min) 37 min    Activity Tolerance Patient tolerated treatment well    Behavior During Therapy [facility] for tasks assessed/performed           Past Medical History:  Diagnosis Date  . Breast cancer ([place]) 05/2017   right breast  . Eczema   . Family history of breast cancer   . Family history of ovarian cancer   . History of radiation therapy 10/17/17- 11/14/17   40.05 directed to the right breast in 15 fractions, followed by a boost of 10 gy given in 5 fractions.   . Hypertension    toxemia during pregnancy, no meds now    Past Surgical History:  Procedure Laterality Date  . ABDOMINAL HYSTERECTOMY    . BREAST LUMPECTOMY WITH RADIOACTIVE SEED AND SENTINEL LYMPH NODE BIOPSY Right 07/17/2017   Procedure: BREAST LUMPECTOMY WITH RADIOACTIVE SEED AND SENTINEL LYMPH NODE BIOPSY;  Surgeon: Rolm Bookbinder, MD;  Location: [place];  Service: General;  Laterality: Right;  . BREAST LUMPECTOMY WITH RADIOACTIVE SEED AND SENTINEL LYMPH NODE BIOPSY Left 07/13/2020   Procedure: LEFT BREAST LUMPECTOMY WITH RADIOACTIVE SEED AND LEFT AXILLARY SENTINEL LYMPH NODE BIOPSY;  Surgeon: Rolm Bookbinder, MD;  Location: [place];  Service: General;  Laterality: Left;  . CESAREAN SECTION     x4  . DILATION AND CURETTAGE OF UTERUS    . KNEE SURGERY Right   . PORT-A-CATH REMOVAL Right 09/04/2018   Procedure: REMOVAL PORT-A-CATH;  Surgeon: Rolm Bookbinder, MD;   Location: [place];  Service: General;  Laterality: Right;  . PORTACATH PLACEMENT N/A 07/17/2017   Procedure: INSERTION PORT-A-CATH WITH [place];  Surgeon: Rolm Bookbinder, MD;  Location: [place];  Service: General;  Laterality: N/A;  . PORTACATH PLACEMENT Right 07/13/2020   Procedure: INSERTION PORT-A-CATH WITH ULTRASOUND GUIDANCE;  Surgeon: Rolm Bookbinder, MD;  Location: [place];  Service: General;  Laterality: Right;    There were no vitals filed for this visit.   Subjective Assessment - 09/23/20 1458    Subjective I got my booster yesterday and my right shoulder is sore.  Radiation is going well. Breast feels like it may be more swollen/thicker, but no problems with shoulder ROM.  I am a little sore under the arm.  Have about 19 visits left. Was doing MLD but hasn't done in the last week because her breast was sore. Havent worn the compression bra after the first 3 days because it was making my bilateral axilla sore. I wore the sleeve for several hours and it was OK. Have been noticing some fatigue with the radiation.  Have next chemo next week, and then have 2 more and I am done.    Pertinent History Pt with prior history of right breast cancer  with  with surgery on 07/17/17 for right lumpectomy and SLNB with 5 LN removed.  Had chemo and radiation. Most recent  surgery1/25/22 for left breast lumpectomy with 0/5 positive nodes. She will have chemo, radiation and infusions for immunotherapy.    Currently in Pain? Yes    Pain Score 3     Pain Location Breast    Pain Orientation Left    Pain Descriptors / Indicators Sore;Tender    Pain Type Acute pain    Pain Onset In the past 7 days    Pain Frequency Intermittent                             [facility] Adult PT Treatment/Exercise - 09/23/20 0001      Shoulder Exercises: Supine   Horizontal ABduction Both;Strengthening;10 reps    Theraband Level (Shoulder Horizontal  ABduction) Level 1 (Yellow)    External Rotation Strengthening;Both;5 reps    Theraband Level (Shoulder External Rotation) Level 1 (Yellow)    External Rotation Limitations held after 5 reps secondary to right shoulder pain    Flexion Strengthening;Both;10 reps    Theraband Level (Shoulder Flexion) Level 1 (Yellow)      Manual Therapy   Manual Lymphatic Drainage (MLD) Therapist performed supraclavicular, left axillary LN, left axillo-inguinal pathway, and left lateral trunk in supine and retracing all steps and ending with LN's. avoided all tender areas and verbally reviewed with pt while doing.   Passive ROM PROM left shoulder briefly in all directions                       PT Long Term Goals - 09/02/20 1522      PT LONG TERM GOAL #1   Title Patient will demonstrate she has returned to baseline since surgery related to shoulder ROM and function.    Time 4    Period Weeks    Status Achieved      PT LONG TERM GOAL #2   Title Pt will be independent in left breast MLD    Time 3    Period Weeks    Status Achieved      PT LONG TERM GOAL #3   Title Pt will report decreased breast heaviness by 50% or greater    Baseline none at all    Time 4    Period Weeks    Status Achieved      PT LONG TERM GOAL #4   Title Pt will be independent with scar massage    Time 2    Period Weeks    Status Achieved      PT LONG TERM GOAL #5   Title Pt will be fit for compression sleeve and will be independent in its use    Baseline received today but has not worn yet.    Time 2    Period Weeks    Status On-going      Additional Long Term Goals   Additional Long Term Goals Yes      PT LONG TERM GOAL #6   Title Pt will maintain left shoulder ROM throughout radiation    Time 6    Period Weeks    Status New    Target Date 10/14/20                 Plan - 09/23/20 1541    Clinical Impression Statement pt continues with good shoulder ROM and no fibrosis under her incision  sites however she developed soreness under both axilla due to her compression bra and has not been able  to wear it comfortably since she started radiation.  SHe is wearing a regular bra but does notice more breast swelling and some light discomfort from radiation.  She was advised to continue MLD to supraclavicular, left axillary and left inguinal LN's and deep abdominals to try and reduce swelling, and to add back in left breast when it is tolerable for her.  We also discussed that she start a walking program when the weather improves, and that she continue the theraband exs 2-3 times per week as long as she can do without pain.  She had some right shoulder pain with ER with TB in supine and we discontinued.  Could possibly be from he recent booster yesterday    Personal Factors and Comorbidities Comorbidity 2    Comorbidities Hx of bilateral breast CA with new onset of left Breast CA. ( right in 2019), lymphedema risk    Stability/Clinical Decision Making Stable/Uncomplicated    Rehab Potential Excellent    PT Frequency 1x / week    PT Duration 6 weeks    PT Treatment/Interventions ADLs/Self Care Home Management;Therapeutic exercise;Patient/family education;Manual techniques;Manual lymph drainage;Scar mobilization;Passive range of motion    PT Next Visit Plan Reassess shoulder ROM, breast swelling as pt goes through radiation.  Pt to come next visit after she completes radiation    PT Home Exercise Plan 4 post op exercises, ABC class, Supine scapular stabs x 10 with yellow Therabandm MLD to left breast    Consulted and Agree with Plan of Care Patient           Patient will benefit from skilled therapeutic intervention in order to improve the following deficits and impairments:  Postural dysfunction,Decreased knowledge of precautions,Decreased scar mobility,Increased edema,Decreased range of motion,Decreased strength  Visit Diagnosis: Abnormal posture  Edema, unspecified type  Malignant  neoplasm of upper-outer quadrant of left female breast, unspecified estrogen receptor status ([place])     Problem List Patient Active Problem List   Diagnosis Date Noted  . Age-related osteoporosis without current pathological fracture 09/08/2020  . Allergic rhinitis 09/08/2020  . Palpitations 09/08/2020  . Personal history of malignant neoplasm of breast 09/08/2020  . Personal history of transient ischemic attack (TIA), and cerebral infarction without residual deficits 09/08/2020  . Pure hypercholesterolemia 09/08/2020  . Vitamin D deficiency 09/08/2020  . Neuropathy due to chemotherapeutic drug ([place]) 07/23/2018  . Port-A-Cath in place 08/28/2017  . TIA (transient ischemic attack) 08/09/2017  . Hypertension   . Genetic testing 07/06/2017  . Family history of ovarian cancer   . Family history of breast cancer   . Malignant neoplasm of upper-outer quadrant of right breast in female, estrogen receptor negative ([place]) 06/26/2017  . Malignant neoplasm of left breast, estrogen receptor positive ([place]) 05/19/2017    Claris Pong 09/23/2020, 3:54 PM  [place], [place], [address] Phone: [phone]   Fax:  [phone]  Name: April [name] MRN: [id number] Date of Birth: Oct 27, 1950  Cheral Almas, PT 09/23/20 3:55 PM

## 2020-09-24 ENCOUNTER — Ambulatory Visit
Admission: RE | Admit: 2020-09-24 | Discharge: 2020-09-24 | Disposition: A | Discharge status: Home / Self Care | Payer: Medicare PPO | Source: Ambulatory Visit | Attending: Radiation Oncology | Admitting: Radiation Oncology

## 2020-09-24 DIAGNOSIS — Z170 Estrogen receptor positive status [ER+]: Secondary | ICD-10-CM | POA: Diagnosis not present

## 2020-09-24 DIAGNOSIS — C50012 Malignant neoplasm of nipple and areola, left female breast: Secondary | ICD-10-CM | POA: Diagnosis not present

## 2020-09-24 DIAGNOSIS — C50212 Malignant neoplasm of upper-inner quadrant of left female breast: Secondary | ICD-10-CM | POA: Diagnosis not present

## 2020-09-27 ENCOUNTER — Ambulatory Visit
Admission: RE | Admit: 2020-09-27 | Discharge: 2020-09-27 | Disposition: A | Discharge status: Home / Self Care | Payer: Medicare PPO | Source: Ambulatory Visit | Attending: Radiation Oncology | Admitting: Radiation Oncology

## 2020-09-27 ENCOUNTER — Other Ambulatory Visit: Payer: Self-pay

## 2020-09-27 DIAGNOSIS — C50411 Malignant neoplasm of upper-outer quadrant of right female breast: Secondary | ICD-10-CM

## 2020-09-27 DIAGNOSIS — C50212 Malignant neoplasm of upper-inner quadrant of left female breast: Secondary | ICD-10-CM | POA: Diagnosis not present

## 2020-09-27 DIAGNOSIS — Z170 Estrogen receptor positive status [ER+]: Secondary | ICD-10-CM | POA: Diagnosis not present

## 2020-09-27 DIAGNOSIS — C50012 Malignant neoplasm of nipple and areola, left female breast: Secondary | ICD-10-CM | POA: Diagnosis not present

## 2020-09-27 NOTE — Progress Notes (Signed)
[place]  [phone] [phone]     ID: TRENNA KIELY DOB: [birth year]  MR#: [id number]  ION#:[id number]  Patient Care Team: Kelton Pillar, MD as PCP - General (Family Medicine) Maury Groninger, Virgie Dad, MD as Consulting Physician (Oncology) Rolm Bookbinder, MD as Consulting Physician (General Surgery) Eppie Gibson, MD as Attending Physician (Radiation Oncology) Newt Minion, MD as Consulting Physician (Orthopedic Surgery) Regal, Tamala Fothergill, DPM as Consulting Physician (Podiatry) Neldon Mc, Donnamarie Poag, MD as Consulting Physician (Allergy and Immunology) Mauro Kaufmann, RN as Oncology Nurse Navigator Rockwell Germany, RN as Oncology Nurse Navigator Bensimhon, Shaune Pascal, MD as Consulting Physician (Cardiology) OTHER MD:  CHIEF COMPLAINT: Estrogen receptor negative breast cancer  CURRENT TREATMENT: Adjuvant chemo immunotherapy   INTERVAL HISTORY: Rovena returns today for follow up and treatment of her new left breast cancer.   She began adjuvant CMF therapy on 07/27/2020.  Today is day 1 cycle 4.  She did generally quite well with her treatment, with no unusual side effects other than mild constipation.  We have discussed how she can deal with at this time  Her most recent echocardiogram on 08/13/2020 showed an ejection fraction of 65-70%.  Since her last visit, she was referred back to Dr. Isidore Moos on 08/31/2020 to review radiation therapy. She subsequently began treatment on 09/09/2020 and is scheduled to finish on 10/20/2020.   REVIEW OF SYSTEMS: Kylah has felt a little bit more fatigued since starting the radiation.  She had a little bit of facial redness after the last chemo which lasted a day or 2 likely related to steroids.  She has had no fever rash or bleeding otherwise.  She is not exercising regularly except using her Cubix's.  She does not take walks outside because of the pollen and she has not started gardening yet.  A detailed review of  systems today was otherwise stable.  COVID 19 VACCINATION STATUS:    LEFT BREAST CANCER HISTORY: From the original intake note:  She underwent bilateral diagnostic mammography with tomography at [facility] on 06/15/2020 showing: breast density category C; this showed in the right breast evidence of postsurgical and radiation changes but this was all stable.  On the left however there was a new group of pleomorphic calcifications in the upper inner quadrant..  She proceeded to biopsy of the left breast area in question on 06/15/2020. Pathology from the procedure ([id number]) showed: invasive ductal carcinoma, grade 3; high-grade ductal carcinoma in situ with necrosis; lymphovascular invasion identified. Prognostic indicators significant for: estrogen receptor 40% positive with weak staining intensity; progesterone receptor 0% negative. Proliferation marker Ki67 of 45%. Her2 positive by immunohistochemistry (3+).   RIGHT BREAST CANCER HISTORY: From the original intake note:  "Sonnet" had bilateral screening mammography at [facility] 06/06/2017.  This showed a possible mass in the right breast at the 11 o'clock position.  On 06/13/2017 she underwent right diagnostic mammography and ultrasonography.  Breast density was category C.  In the right breast at the 11 o'clock position there was a 1 cm area by mammography.  By ultrasound this confirmed a 1.0 cm irregular mass with lobulated margins in the upper outer quadrant of the right breast.  There was a second, 0.4 cm lobulated mass in the same quadrant.  The right axilla was sonographically benign.  On 06/20/2017 biopsy of the 2 right breast masses in question was performed.  The final pathology ([id number]) found the smaller mass to be only fibrocystic change.  This is felt to  be concordant.  The larger mass however was an invasive ductal carcinoma, grade 3, estrogen and progesterone receptor negative, with an MIB-1 of 30%, and HER-2 amplified, with a signals  ratio of 2.24.  The number per cell was 4.60.  The patient's subsequent history is as detailed below.   PAST MEDICAL HISTORY: Past Medical History:  Diagnosis Date  . Breast cancer (HCC) 05/2017   right breast  . Eczema   . Family history of breast cancer   . Family history of ovarian cancer   . History of radiation therapy 10/17/17- 11/14/17   40.05 directed to the right breast in 15 fractions, followed by a boost of 10 gy given in 5 fractions.   . Hypertension    toxemia during pregnancy, no meds now    PAST SURGICAL HISTORY: Past Surgical History:  Procedure Laterality Date  . ABDOMINAL HYSTERECTOMY    . BREAST LUMPECTOMY WITH RADIOACTIVE SEED AND SENTINEL LYMPH NODE BIOPSY Right 07/17/2017   Procedure: BREAST LUMPECTOMY WITH RADIOACTIVE SEED AND SENTINEL LYMPH NODE BIOPSY;  Surgeon: Emelia Loron, MD;  Location: [facility];  Service: General;  Laterality: Right;  . BREAST LUMPECTOMY WITH RADIOACTIVE SEED AND SENTINEL LYMPH NODE BIOPSY Left 07/13/2020   Procedure: LEFT BREAST LUMPECTOMY WITH RADIOACTIVE SEED AND LEFT AXILLARY SENTINEL LYMPH NODE BIOPSY;  Surgeon: Emelia Loron, MD;  Location: [facility];  Service: General;  Laterality: Left;  . CESAREAN SECTION     x4  . DILATION AND CURETTAGE OF UTERUS    . KNEE SURGERY Right   . PORT-A-CATH REMOVAL Right 09/04/2018   Procedure: REMOVAL PORT-A-CATH;  Surgeon: Emelia Loron, MD;  Location: [facility];  Service: General;  Laterality: Right;  . PORTACATH PLACEMENT N/A 07/17/2017   Procedure: INSERTION PORT-A-CATH WITH [place];  Surgeon: Emelia Loron, MD;  Location: [facility];  Service: General;  Laterality: N/A;  . PORTACATH PLACEMENT Right 07/13/2020   Procedure: INSERTION PORT-A-CATH WITH ULTRASOUND GUIDANCE;  Surgeon: Emelia Loron, MD;  Location: [facility];  Service: General;  Laterality: Right;    FAMILY HISTORY Family History   Problem Relation Age of Onset  . Breast cancer Mother 33       again at 30 in other breast   . Heart attack Father 25  . Breast cancer Sister 26  . Breast cancer Maternal Grandmother 56       spread to lungs, died at 65  . Ovarian cancer Cousin 56  . Prostate cancer Cousin   The patient's father died at age 65 from a heart attack.  The patient's mother is currently living at age 53 (as of January 2019).  The patient had 1 sister who was diagnosed with breast cancer at age 40 and died from metastatic disease at age 75.  The patient has 1 brother.  In addition the patient's mother was diagnosed with breast cancer at age 79, on the left side, and now has a right-sided breast cancer diagnosed in January 2019.  There is in addition a cousin with ovarian cancer diagnosed when she was 71 years old   GYNECOLOGIC HISTORY:  No LMP recorded. Patient has had a hysterectomy. Menarche age 30, first live birth age 50, the patient had 3 live births, 1 of whom survived only 2 days.  She underwent hysterectomy without salpingo-oophorectomy September 05, 1978.  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  Her husband [name] his Education officer, environmental at [employer].  The patient's daughter Ander Slade lives in [place] where she is an Programmer, systems.  The patient's daughter Adela Lank lives in [place] working for the department of defense.  The patient has 3 grandchildren, 1 of whom is in the seventh grade but is actually the captain of the eighth grade basketball team and is playing in a championship February 2020.   ADVANCED DIRECTIVES: Not in place   HEALTH MAINTENANCE: Social History   Tobacco Use  . Smoking status: Never Smoker  . Smokeless tobacco: Never Used  Vaping Use  . Vaping Use: Never used  Substance Use Topics  . Alcohol use:  Yes    Comment: social  . Drug use: No     Colonoscopy: April 2018/[facility]  PAP: Status post hysterectomy  Bone density:   Allergies  Allergen Reactions  . Tape Itching and Rash  . Betamethasone Dipropionate Other (See Comments)  . Cetirizine Hcl Other (See Comments)  . Lisinopril Other (See Comments)  . Loratadine Other (See Comments)  . Nsaids     Stomach issue  . Other Other (See Comments)  . Statins Other (See Comments)  . Sulfa Antibiotics Rash    Current Outpatient Medications  Medication Sig Dispense Refill  . aspirin 81 MG chewable tablet Chew 81 mg by mouth daily.    . Calcium-Vitamin D-Vitamin K (CALCIUM + D) [phone] MG-UNT-MCG CHEW     . co-enzyme Q-10 30 MG capsule Take 30 mg by mouth daily.    . hydrochlorothiazide (MICROZIDE) 12.5 MG capsule TAKE 1 CAPSULE BY MOUTH EVERY DAY 90 capsule 3  . lidocaine-prilocaine (EMLA) cream Apply 1 application topically as needed. 30 g 0  . lidocaine-prilocaine (EMLA) cream Apply to affected area once 30 g 3  . Omega-3 Fatty Acids (FISH OIL) 1000 MG CAPS Take 1 capsule by mouth daily.    . prochlorperazine (COMPAZINE) 10 MG tablet Take 1 tablet (10 mg total) by mouth every 6 (six) hours as needed (Nausea or vomiting). 30 tablet 1  . Red Yeast Rice Extract (RED YEAST RICE PO) Take by mouth.    . traMADol (ULTRAM) 50 MG tablet Take 1 tablet (50 mg total) by mouth every 6 (six) hours as needed. 10 tablet 0  . vitamin B-12 (CYANOCOBALAMIN) 1000 MCG tablet Take 1,000 mcg by mouth daily.    . vitamin C (ASCORBIC ACID) 250 MG tablet Take 250 mg by mouth daily.     No current facility-administered medications for this visit.    OBJECTIVE: African-American woman in no acute distress Vitals:   09/28/20 1224  BP: (!) 158/64  Pulse: 65  Resp: 20  Temp: (!) 97.3 F (36.3 C)  SpO2: 100%     Body mass index is 26.52 kg/m.    Wt Readings from Last 3 Encounters:  09/28/20 152 lb 1.6 oz (69 kg)  09/07/20 153 lb (69.4 kg)   08/31/20 152 lb 6 oz (69.1 kg)  ECOG FS:1 - Symptomatic but completely ambulatory   Sclerae unicteric, EOMs intact Wearing a mask No cervical or supraclavicular adenopathy Lungs no rales or rhonchi Heart regular rate and rhythm Abd soft, nontender, positive bowel sounds MSK no focal spinal tenderness, no upper extremity lymphedema Neuro: nonfocal, well oriented, appropriate affect Breasts: The right breast is status post remote lumpectomy followed by radiation.  The left breast is status post most recent lumpectomy  and is now receiving radiation.  There is already some hyperpigmentation.  Both axillae are benign.   LAB RESULTS:  CMP     Component Value Date/Time   NA 140 09/28/2020 1143   K 3.9 09/28/2020 1143   CL 103 09/28/2020 1143   CO2 27 09/28/2020 1143   GLUCOSE 114 (H) 09/28/2020 1143   BUN 14 09/28/2020 1143   CREATININE 0.99 09/28/2020 1143   CALCIUM 9.5 09/28/2020 1143   PROT 7.4 09/28/2020 1143   ALBUMIN 3.9 09/28/2020 1143   AST 26 09/28/2020 1143   ALT 23 09/28/2020 1143   ALKPHOS 64 09/28/2020 1143   BILITOT 0.2 (L) 09/28/2020 1143   GFRNONAA >60 09/28/2020 1143   GFRAA >60 07/10/2019 1008   GFRAA >60 06/27/2017 0843    No results found for: TOTALPROTELP, ALBUMINELP, A1GS, A2GS, BETS, BETA2SER, GAMS, MSPIKE, SPEI  No results found for: KPAFRELGTCHN, LAMBDASER, KAPLAMBRATIO  Lab Results  Component Value Date   WBC 2.3 (L) 09/28/2020   NEUTROABS 0.9 (L) 09/28/2020   HGB 10.4 (L) 09/28/2020   HCT 31.2 (L) 09/28/2020   MCV 89.7 09/28/2020   PLT 200 09/28/2020   No results found for: LABCA2  No components found for: MWUXLK440  No results for input(s): INR in the last 168 hours.  No results found for: LABCA2  No results found for: NUU725  No results found for: DGU440  No results found for: HKV425  No results found for: CA2729  No components found for: HGQUANT  No results found for: CEA1 / No results found for: CEA1   No results found  for: AFPTUMOR  No results found for: CHROMOGRNA  No results found for: HGBA, HGBA2QUANT, HGBFQUANT, HGBSQUAN (Hemoglobinopathy evaluation)   No results found for: LDH  Lab Results  Component Value Date   IRON 77 05/21/2018   TIBC 404 05/21/2018   IRONPCTSAT 19 (L) 05/21/2018   (Iron and TIBC)  Lab Results  Component Value Date   FERRITIN 24 05/21/2018    Urinalysis    Component Value Date/Time   COLORURINE COLORLESS (A) 08/09/2017 2224   APPEARANCEUR CLEAR 08/09/2017 2224   LABSPEC 1.004 (L) 08/09/2017 2224   PHURINE 6.0 08/09/2017 [place] 08/09/2017 2224   HGBUR NEGATIVE 08/09/2017 2224   BILIRUBINUR NEGATIVE 08/09/2017 2224   KETONESUR NEGATIVE 08/09/2017 2224   PROTEINUR NEGATIVE 08/09/2017 2224   NITRITE NEGATIVE 08/09/2017 2224   LEUKOCYTESUR NEGATIVE 08/09/2017 2224    STUDIES: No results found.   ELIGIBLE FOR AVAILABLE RESEARCH PROTOCOL: no   ASSESSMENT: 70 y.o. [place] woman   RIGHT BREAST CANCER (0) status post right breast upper outer quadrant biopsy 06/20/2017 for a clinical T1b N0, stage IA invasive ductal carcinoma, grade 3, estrogen and progesterone receptor negative, but HER-2 amplified, with an MIB-1 of 30%  (1) genetics testing 07/05/2017 through the Common Hereditary Cancer Panel offered by [facility] found no deleterious mutations in APC, ATM, AXIN2, BARD1, BMPR1A, BRCA1, BRCA2, BRIP1, CDH1, CDKN2A (p14ARF), CDKN2A (p16INK4a), CKD4, CHEK2, CTNNA1, DICER1, EPCAM (Deletion/duplication testing only), GREM1 (promoter region deletion/duplication testing only), KIT, MEN1, MLH1, MSH2, MSH3, MSH6, MUTYH, NBN, NF1, NHTL1, PALB2, PDGFRA, PMS2, POLD1, POLE, PTEN, RAD50, RAD51C, RAD51D, SDHB, SDHC, SDHD, SMAD4, SMARCA4. STK11, TP53, TSC1, TSC2, and VHL.  The following genes were evaluated for sequence changes only: SDHA and HOXB13 c.251G>A variant only.  (a) a Variant of uncertain significance in MSH2 was identified c.1331G>T  (p.Arg444Leu).   (2) status post right lumpectomy and sentinel lymph  node sampling 07/17/2017 for a pT1c pN0, stage IA invasive ductal carcinoma, grade 2, with negative margins.  A total of 5 lymph nodes were removed  (3) adjuvant chemotherapy consisting of paclitaxel weekly x12 together with trastuzumab every 21 days starting 08/07/2017  (a) paclitaxel stopped after 8 doses because of neuropathy (last dose 09/25/2017  (4) trastuzumab continued to total 1 year (last dose 07/23/2018)  (a) echo 08/10/2017 showed an ejection fraction in the 65-70% range  (b) echo on 11/01/2017 shows EF of 60-65%  (c) cardiogram 02/05/2018 shows an ejection fraction in the 60-65% range.  (d) echocardiogram 05/28/2018 shows an ejection fraction in the 65-70%.  (5) adjuvant radiation 10/17/2017-11/14/2016: 40.05 Gy directed to the Right Breast in 15 fractions, followed by a boost of 10 Gy given in 5 fractions   (6) anemia, with normal MCV, ferritin, B12, folate, and inappropriately normal reticulocyte count, consistent with anemia of chronic illness.  LEFT BREAST CANCER: (7) status post left breast upper inner quadrant biopsy 06/15/2020 for a clinical T1 N0 invasive ductal carcinoma, grade 3, weakly estrogen receptor positive, progesterone receptor negative, HER2 amplified, with an MIB-1 of 45%.  (8) status post left lumpectomy and sentinel lymph node sampling 07/13/2020 for a pT1c pN0, stage IB invasive ductal carcinoma, grade 3, with negative margins  (a) a total of 5 axilla lymph nodes were removed, all benign  (b) repeat prognostic panel again weakly estrogen receptor positive, progesterone receptor negative, now HER-2 not amplified  (9) adjuvant chemotherapy and anti-HER2 immunotherapy to start 07/27/2020, consisting of CMF chemotherapy to be repeated every 21 days and trastuzumab to be continued for 1 year.  (a) echocardiogram 05/28/2020 shows an ejection fraction in the 65-70% range  (b) echo 08/13/2020 shows  an ejection fraction in the 65 to 70% range  (c) cycle 4 of CMF delayed because of neutropenia; full [place] added  (10) trastuzumab to be continued a minimum of 1 year  (11) adjuvant radiation can be given concurrently with CMF  (12) to start letrozole at the completion of local treatment   PLAN: Vernie is tolerating chemotherapy generally well but her white cell count has been steadily dropping and her ANC now is less than 1.0.  This is due to chemo, due to the fact that she had chemo previously, and due to the fact that she has started radiation.  We are not going to be able to continue curative chemotherapy unless we use a growth factor and I have asked our team to get approval for for [place] to be given on day 3 after the next 5 CMF treatments.  We have scheduled her next CMF for 1 week from today  Elaria has a good understanding of this.  She knows to call us if she develops a fever or any other new symptoms.  I will see her again in a week just to make sure everything is in place before continuing treatment  Total encounter time 40 minutes.   Khrista Braun, Valentino Hue, MD  09/28/20 12:49 PM Medical Oncology and Hematology [facility] [address] [place], [place] [address] Tel. [phone]    Fax. [phone]   I, Mickie Bail, am acting as scribe for Dr. Valentino Hue. Jaleel Allen.  I, Ruthann Cancer MD, have reviewed the above documentation for accuracy and completeness, and I agree with the above.   *Total Encounter Time as defined by the Centers for Medicare and Medicaid Services includes, in addition to the face-to-face time of a patient visit (documented  in the note above) non-face-to-face time: obtaining and reviewing outside history, ordering and reviewing medications, tests or procedures, care coordination (communications with other health care professionals or caregivers) and documentation in the medical record.

## 2020-09-28 ENCOUNTER — Inpatient Hospital Stay: Payer: Medicare PPO

## 2020-09-28 ENCOUNTER — Telehealth: Payer: Self-pay

## 2020-09-28 ENCOUNTER — Inpatient Hospital Stay: Payer: Medicare PPO | Attending: Oncology | Admitting: Oncology

## 2020-09-28 ENCOUNTER — Ambulatory Visit
Admission: RE | Admit: 2020-09-28 | Discharge: 2020-09-28 | Disposition: A | Discharge status: Home / Self Care | Payer: Medicare PPO | Source: Ambulatory Visit | Attending: Radiation Oncology | Admitting: Radiation Oncology

## 2020-09-28 ENCOUNTER — Encounter: Payer: Self-pay | Admitting: *Deleted

## 2020-09-28 VITALS — BP 158/64 | HR 65 | Temp 97.3°F | Resp 20 | Ht 63.5 in | Wt 152.1 lb

## 2020-09-28 DIAGNOSIS — Z79899 Other long term (current) drug therapy: Secondary | ICD-10-CM | POA: Diagnosis not present

## 2020-09-28 DIAGNOSIS — Z8249 Family history of ischemic heart disease and other diseases of the circulatory system: Secondary | ICD-10-CM | POA: Diagnosis not present

## 2020-09-28 DIAGNOSIS — Z923 Personal history of irradiation: Secondary | ICD-10-CM | POA: Diagnosis not present

## 2020-09-28 DIAGNOSIS — Z5189 Encounter for other specified aftercare: Secondary | ICD-10-CM | POA: Insufficient documentation

## 2020-09-28 DIAGNOSIS — Z888 Allergy status to other drugs, medicaments and biological substances status: Secondary | ICD-10-CM | POA: Insufficient documentation

## 2020-09-28 DIAGNOSIS — Z8042 Family history of malignant neoplasm of prostate: Secondary | ICD-10-CM | POA: Insufficient documentation

## 2020-09-28 DIAGNOSIS — C50411 Malignant neoplasm of upper-outer quadrant of right female breast: Secondary | ICD-10-CM | POA: Diagnosis not present

## 2020-09-28 DIAGNOSIS — Z170 Estrogen receptor positive status [ER+]: Secondary | ICD-10-CM | POA: Diagnosis not present

## 2020-09-28 DIAGNOSIS — Z803 Family history of malignant neoplasm of breast: Secondary | ICD-10-CM | POA: Diagnosis not present

## 2020-09-28 DIAGNOSIS — Z171 Estrogen receptor negative status [ER-]: Secondary | ICD-10-CM

## 2020-09-28 DIAGNOSIS — Z8041 Family history of malignant neoplasm of ovary: Secondary | ICD-10-CM | POA: Diagnosis not present

## 2020-09-28 DIAGNOSIS — C50212 Malignant neoplasm of upper-inner quadrant of left female breast: Secondary | ICD-10-CM | POA: Diagnosis not present

## 2020-09-28 DIAGNOSIS — Z5111 Encounter for antineoplastic chemotherapy: Secondary | ICD-10-CM | POA: Insufficient documentation

## 2020-09-28 DIAGNOSIS — Z886 Allergy status to analgesic agent status: Secondary | ICD-10-CM | POA: Insufficient documentation

## 2020-09-28 DIAGNOSIS — Z882 Allergy status to sulfonamides status: Secondary | ICD-10-CM | POA: Insufficient documentation

## 2020-09-28 DIAGNOSIS — Z5112 Encounter for antineoplastic immunotherapy: Secondary | ICD-10-CM | POA: Diagnosis not present

## 2020-09-28 DIAGNOSIS — C50012 Malignant neoplasm of nipple and areola, left female breast: Secondary | ICD-10-CM | POA: Diagnosis not present

## 2020-09-28 DIAGNOSIS — I10 Essential (primary) hypertension: Secondary | ICD-10-CM | POA: Insufficient documentation

## 2020-09-28 LAB — CBC WITH DIFFERENTIAL (CANCER CENTER ONLY)
Abs Immature Granulocytes: 0.01 10*3/uL (ref 0.00–0.07)
Basophils Absolute: 0 10*3/uL (ref 0.0–0.1)
Basophils Relative: 0 %
Eosinophils Absolute: 0.1 10*3/uL (ref 0.0–0.5)
Eosinophils Relative: 5 %
HCT: 31.2 % — ABNORMAL LOW (ref 36.0–46.0)
Hemoglobin: 10.4 g/dL — ABNORMAL LOW (ref 12.0–15.0)
Immature Granulocytes: 0 %
Lymphocytes Relative: 35 %
Lymphs Abs: 0.8 10*3/uL (ref 0.7–4.0)
MCH: 29.9 pg (ref 26.0–34.0)
MCHC: 33.3 g/dL (ref 30.0–36.0)
MCV: 89.7 fL (ref 80.0–100.0)
Monocytes Absolute: 0.4 10*3/uL (ref 0.1–1.0)
Monocytes Relative: 18 %
Neutro Abs: 0.9 10*3/uL — ABNORMAL LOW (ref 1.7–7.7)
Neutrophils Relative %: 42 %
Platelet Count: 200 10*3/uL (ref 150–400)
RBC: 3.48 MIL/uL — ABNORMAL LOW (ref 3.87–5.11)
RDW: 14.1 % (ref 11.5–15.5)
WBC Count: 2.3 10*3/uL — ABNORMAL LOW (ref 4.0–10.5)
nRBC: 0 % (ref 0.0–0.2)

## 2020-09-28 LAB — CMP (CANCER CENTER ONLY)
ALT: 23 U/L (ref 0–44)
AST: 26 U/L (ref 15–41)
Albumin: 3.9 g/dL (ref 3.5–5.0)
Alkaline Phosphatase: 64 U/L (ref 38–126)
Anion gap: 10 (ref 5–15)
BUN: 14 mg/dL (ref 8–23)
CO2: 27 mmol/L (ref 22–32)
Calcium: 9.5 mg/dL (ref 8.9–10.3)
Chloride: 103 mmol/L (ref 98–111)
Creatinine: 0.99 mg/dL (ref 0.44–1.00)
GFR, Estimated: >60 mL/min (ref >60)
Glucose, Bld: 114 mg/dL — ABNORMAL HIGH (ref 70–99)
Potassium: 3.9 mmol/L (ref 3.5–5.1)
Sodium: 140 mmol/L (ref 135–145)
Total Bilirubin: 0.2 mg/dL — ABNORMAL LOW (ref 0.3–1.2)
Total Protein: 7.4 g/dL (ref 6.5–8.1)

## 2020-09-28 NOTE — Patient Instructions (Signed)

## 2020-09-28 NOTE — Telephone Encounter (Signed)
Pt called today to get more information concerning "immune booster shot" Dr Jana Hakim discussed with pt. Pt was under the impression this was another COVID vaccine. This LPN explained to pt what Fulphila is, and explained that insurance would have to authorize this injection to be given at our o/p clinic before we can give it. Pt verbalized understanding of medication with teachback, and understands we will be in touch with her to schedule once we receive PA from insurance. Pt verbalizes thanks.

## 2020-09-29 ENCOUNTER — Other Ambulatory Visit: Payer: Self-pay

## 2020-09-29 ENCOUNTER — Ambulatory Visit
Admission: RE | Admit: 2020-09-29 | Discharge: 2020-09-29 | Disposition: A | Discharge status: Home / Self Care | Payer: Medicare PPO | Source: Ambulatory Visit | Attending: Radiation Oncology | Admitting: Radiation Oncology

## 2020-09-29 DIAGNOSIS — C50212 Malignant neoplasm of upper-inner quadrant of left female breast: Secondary | ICD-10-CM | POA: Diagnosis not present

## 2020-09-29 DIAGNOSIS — Z170 Estrogen receptor positive status [ER+]: Secondary | ICD-10-CM | POA: Diagnosis not present

## 2020-09-29 DIAGNOSIS — C50012 Malignant neoplasm of nipple and areola, left female breast: Secondary | ICD-10-CM | POA: Diagnosis not present

## 2020-09-30 ENCOUNTER — Ambulatory Visit
Admission: RE | Admit: 2020-09-30 | Discharge: 2020-09-30 | Disposition: A | Discharge status: Home / Self Care | Payer: Medicare PPO | Source: Ambulatory Visit | Attending: Radiation Oncology | Admitting: Radiation Oncology

## 2020-09-30 DIAGNOSIS — C50212 Malignant neoplasm of upper-inner quadrant of left female breast: Secondary | ICD-10-CM | POA: Diagnosis not present

## 2020-09-30 DIAGNOSIS — Z170 Estrogen receptor positive status [ER+]: Secondary | ICD-10-CM | POA: Diagnosis not present

## 2020-09-30 DIAGNOSIS — C50012 Malignant neoplasm of nipple and areola, left female breast: Secondary | ICD-10-CM | POA: Diagnosis not present

## 2020-10-01 ENCOUNTER — Ambulatory Visit
Admission: RE | Admit: 2020-10-01 | Discharge: 2020-10-01 | Disposition: A | Discharge status: Home / Self Care | Payer: Medicare PPO | Source: Ambulatory Visit | Attending: Radiation Oncology | Admitting: Radiation Oncology

## 2020-10-01 ENCOUNTER — Other Ambulatory Visit: Payer: Self-pay

## 2020-10-01 DIAGNOSIS — C50212 Malignant neoplasm of upper-inner quadrant of left female breast: Secondary | ICD-10-CM | POA: Diagnosis not present

## 2020-10-01 DIAGNOSIS — C50012 Malignant neoplasm of nipple and areola, left female breast: Secondary | ICD-10-CM | POA: Diagnosis not present

## 2020-10-01 DIAGNOSIS — Z170 Estrogen receptor positive status [ER+]: Secondary | ICD-10-CM | POA: Diagnosis not present

## 2020-10-04 ENCOUNTER — Other Ambulatory Visit: Payer: Self-pay

## 2020-10-04 ENCOUNTER — Ambulatory Visit
Admission: RE | Admit: 2020-10-04 | Discharge: 2020-10-04 | Disposition: A | Discharge status: Home / Self Care | Payer: Medicare PPO | Source: Ambulatory Visit | Attending: Radiation Oncology | Admitting: Radiation Oncology

## 2020-10-04 DIAGNOSIS — C50012 Malignant neoplasm of nipple and areola, left female breast: Secondary | ICD-10-CM | POA: Diagnosis not present

## 2020-10-04 DIAGNOSIS — Z170 Estrogen receptor positive status [ER+]: Secondary | ICD-10-CM | POA: Diagnosis not present

## 2020-10-04 DIAGNOSIS — C50212 Malignant neoplasm of upper-inner quadrant of left female breast: Secondary | ICD-10-CM | POA: Diagnosis not present

## 2020-10-04 NOTE — Progress Notes (Signed)
[facility]  [phone] [phone]     ID: April [name] DOB: 07-12-1950  MR#: [id number]  PPJ#:[id number]  Patient Care Team: Kelton Pillar, MD as PCP - General (Family Medicine) Martita Brumm, Virgie Dad, MD as Consulting Physician (Oncology) Rolm Bookbinder, MD as Consulting Physician (General Surgery) Eppie Gibson, MD as Attending Physician (Radiation Oncology) Newt Minion, MD as Consulting Physician (Orthopedic Surgery) Regal, Tamala Fothergill, DPM as Consulting Physician (Podiatry) Neldon Mc, Donnamarie Poag, MD as Consulting Physician (Allergy and Immunology) Mauro Kaufmann, RN as Oncology Nurse Navigator Rockwell Germany, RN as Oncology Nurse Navigator Bensimhon, Shaune Pascal, MD as Consulting Physician (Cardiology) OTHER MD:  CHIEF COMPLAINT: Estrogen receptor negative breast cancer  CURRENT TREATMENT: Adjuvant chemo immunotherapy   INTERVAL HISTORY: April [name] returns today for follow up and treatment of her new left breast cancer.   She began adjuvant CMF therapy on 07/27/2020.  Today is day 1 cycle 4.  She tolerates this well, with no unusual side effects other than mild constipation.  She now has that under good control using stool softeners twice a day and MiraLAX daily. Her most recent echocardiogram on 08/13/2020 showed an ejection fraction of 65-70%.  She is scheduled to finish radiation therapy on 10/20/2020.  We are holding the methotrexate through the radiation treatments.  Methotrexate will be restarted with the rest of the chemo on 10/26/2020.  REVIEW OF SYSTEMS: April [name] generally is doing well.  So far she has had no peeling and no bleeding from the radiation.  She has mild fatigue.  She is exercising regularly and her husband just got her in addition to the [name] and other equipment a new treadmill.  Detailed review of systems today was otherwise stable.   COVID 19 VACCINATION STATUS: fully vaccinated [employer]), with booster 01/2020   LEFT  BREAST CANCER HISTORY: From the original intake note:  She underwent bilateral diagnostic mammography with tomography at [facility] on 06/15/2020 showing: breast density category C; this showed in the right breast evidence of postsurgical and radiation changes but this was all stable.  On the left however there was a new group of pleomorphic calcifications in the upper inner quadrant..  She proceeded to biopsy of the left breast area in question on 06/15/2020. Pathology from the procedure ([id number]) showed: invasive ductal carcinoma, grade 3; high-grade ductal carcinoma in situ with necrosis; lymphovascular invasion identified. Prognostic indicators significant for: estrogen receptor 40% positive with weak staining intensity; progesterone receptor 0% negative. Proliferation marker Ki67 of 45%. Her2 positive by immunohistochemistry (3+).   RIGHT BREAST CANCER HISTORY: From the original intake note:  "April [name]" had bilateral screening mammography at [facility] 06/06/2017.  This showed a possible mass in the right breast at the 11 o'clock position.  On 06/13/2017 she underwent right diagnostic mammography and ultrasonography.  Breast density was category C.  In the right breast at the 11 o'clock position there was a 1 cm area by mammography.  By ultrasound this confirmed a 1.0 cm irregular mass with lobulated margins in the upper outer quadrant of the right breast.  There was a second, 0.4 cm lobulated mass in the same quadrant.  The right axilla was sonographically benign.  On 06/20/2017 biopsy of the 2 right breast masses in question was performed.  The final pathology ([id number]) found the smaller mass to be only fibrocystic change.  This is felt to be concordant.  The larger mass however was an invasive ductal carcinoma, grade 3, estrogen and progesterone receptor negative, with an  MIB-1 of 30%, and HER-2 amplified, with a signals ratio of 2.24.  The number per cell was 4.60.  The patient's subsequent  history is as detailed below.   PAST MEDICAL HISTORY: Past Medical History:  Diagnosis Date  . Breast cancer ([place]) 05/2017   right breast  . Eczema   . Family history of breast cancer   . Family history of ovarian cancer   . History of radiation therapy 10/17/17- 11/14/17   40.05 directed to the right breast in 15 fractions, followed by a boost of 10 gy given in 5 fractions.   . Hypertension    toxemia during pregnancy, no meds now    PAST SURGICAL HISTORY: Past Surgical History:  Procedure Laterality Date  . ABDOMINAL HYSTERECTOMY    . BREAST LUMPECTOMY WITH RADIOACTIVE SEED AND SENTINEL LYMPH NODE BIOPSY Right 07/17/2017   Procedure: BREAST LUMPECTOMY WITH RADIOACTIVE SEED AND SENTINEL LYMPH NODE BIOPSY;  Surgeon: Rolm Bookbinder, MD;  Location: [facility];  Service: General;  Laterality: Right;  . BREAST LUMPECTOMY WITH RADIOACTIVE SEED AND SENTINEL LYMPH NODE BIOPSY Left 07/13/2020   Procedure: LEFT BREAST LUMPECTOMY WITH RADIOACTIVE SEED AND LEFT AXILLARY SENTINEL LYMPH NODE BIOPSY;  Surgeon: Rolm Bookbinder, MD;  Location: [facility];  Service: General;  Laterality: Left;  . CESAREAN SECTION     x4  . DILATION AND CURETTAGE OF UTERUS    . KNEE SURGERY Right   . PORT-A-CATH REMOVAL Right 09/04/2018   Procedure: REMOVAL PORT-A-CATH;  Surgeon: Rolm Bookbinder, MD;  Location: [facility];  Service: General;  Laterality: Right;  . PORTACATH PLACEMENT N/A 07/17/2017   Procedure: INSERTION PORT-A-CATH WITH [place];  Surgeon: Rolm Bookbinder, MD;  Location: [facility];  Service: General;  Laterality: N/A;  . PORTACATH PLACEMENT Right 07/13/2020   Procedure: INSERTION PORT-A-CATH WITH ULTRASOUND GUIDANCE;  Surgeon: Rolm Bookbinder, MD;  Location: [facility];  Service: General;  Laterality: Right;    FAMILY HISTORY Family History  Problem Relation Age of Onset  . Breast cancer Mother 44       again at 54  in other breast   . Heart attack Father 32  . Breast cancer Sister 70  . Breast cancer Maternal Grandmother 3       spread to lungs, died at 19  . Ovarian cancer Cousin 62  . Prostate cancer Cousin   The patient's father died at age 62 from a heart attack.  The patient's mother is currently living at age 49 (as of January 2019).  The patient had 1 sister who was diagnosed with breast cancer at age 106 and died from metastatic disease at age 66.  The patient has 1 brother.  In addition the patient's mother was diagnosed with breast cancer at age 30, on the left side, and now has a right-sided breast cancer diagnosed in January 2019.  There is in addition a cousin with ovarian cancer diagnosed when she was 71 years old   GYNECOLOGIC HISTORY:  No LMP recorded. Patient has had a hysterectomy. Menarche age 89, first live birth age 25, the patient had 3 live births, 1 of whom survived only 2 days.  She underwent hysterectomy without salpingo-oophorectomy September 05, 1978.  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  Her husband April [name] his Theme park manager at [employer] [place].  The patient's daughter April [name] lives in [place] where she is an Tourist information centre manager.  The patient's daughter April [name] lives in [place] working for the department of defense.  The patient has 3 grandchildren, 1 of whom is in the seventh grade but is actually the captain of the eighth grade basketball team and is playing in a championship February 2020.   ADVANCED DIRECTIVES: Not in place   HEALTH MAINTENANCE: Social History   Tobacco Use  . Smoking status: Never Smoker  . Smokeless tobacco: Never Used  Vaping Use  . Vaping Use: Never used  Substance Use Topics  . Alcohol use: Yes    Comment: social  . Drug use: No     Colonoscopy: April  2018/[facility]  PAP: Status post hysterectomy  Bone density:   Allergies  Allergen Reactions  . Tape Itching and Rash  . Betamethasone Dipropionate Other (See Comments)  . Cetirizine Hcl Other (See Comments)  . Lisinopril Other (See Comments)  . Loratadine Other (See Comments)  . Nsaids     Stomach issue  . Other Other (See Comments)  . Statins Other (See Comments)  . Sulfa Antibiotics Rash    Current Outpatient Medications  Medication Sig Dispense Refill  . aspirin 81 MG chewable tablet Chew 81 mg by mouth daily.    . Calcium-Vitamin D-Vitamin K (CALCIUM + D) [phone] MG-UNT-MCG CHEW     . co-enzyme Q-10 30 MG capsule Take 30 mg by mouth daily.    . hydrochlorothiazide (MICROZIDE) 12.5 MG capsule TAKE 1 CAPSULE BY MOUTH EVERY DAY 90 capsule 3  . lidocaine-prilocaine (EMLA) cream Apply 1 application topically as needed. 30 g 0  . lidocaine-prilocaine (EMLA) cream Apply to affected area once 30 g 3  . Omega-3 Fatty Acids (FISH OIL) 1000 MG CAPS Take 1 capsule by mouth daily.    . prochlorperazine (COMPAZINE) 10 MG tablet Take 1 tablet (10 mg total) by mouth every 6 (six) hours as needed (Nausea or vomiting). 30 tablet 1  . Red Yeast Rice Extract (RED YEAST RICE PO) Take by mouth.    . traMADol (ULTRAM) 50 MG tablet Take 1 tablet (50 mg total) by mouth every 6 (six) hours as needed. 10 tablet 0  . vitamin B-12 (CYANOCOBALAMIN) 1000 MCG tablet Take 1,000 mcg by mouth daily.    . vitamin C (ASCORBIC ACID) 250 MG tablet Take 250 mg by mouth daily.     No current facility-administered medications for this visit.    OBJECTIVE: African-American woman in no acute distress Vitals:   10/05/20 1239  BP: (!) 174/85  Pulse: 62  Resp: 18  Temp: (!) 97.2 F (36.2 C)  SpO2: 100%     Body mass index is 26.64 kg/m.    Wt Readings from Last 3 Encounters:  10/05/20 152 lb 12.8 oz (69.3 kg)  09/28/20 152 lb 1.6 oz (69 kg)  09/07/20 153 lb (69.4 kg)  ECOG FS:1 - Symptomatic but completely  ambulatory   Sclerae unicteric, EOMs intact Wearing a mask No cervical or supraclavicular adenopathy Lungs no rales or rhonchi Heart regular rate and rhythm Abd soft, nontender, positive bowel sounds MSK no focal spinal tenderness, no upper extremity lymphedema Neuro: nonfocal, well oriented, appropriate affect Breasts: The right breast is benign.  The left breast is currently receiving radiation.  There is hyperpigmentation but no desquamation.  Both axillae are benign.   LAB RESULTS:  CMP     Component Value Date/Time   NA 140  10/05/2020 1219   K 4.0 10/05/2020 1219   CL 103 10/05/2020 1219   CO2 26 10/05/2020 1219   GLUCOSE 91 10/05/2020 1219   BUN 13 10/05/2020 1219   CREATININE 1.02 (H) 10/05/2020 1219   CALCIUM 9.5 10/05/2020 1219   PROT 7.4 10/05/2020 1219   ALBUMIN 3.9 10/05/2020 1219   AST 34 10/05/2020 1219   ALT 37 10/05/2020 1219   ALKPHOS 61 10/05/2020 1219   BILITOT 0.3 10/05/2020 1219   GFRNONAA 59 (L) 10/05/2020 1219   GFRAA >60 07/10/2019 1008   GFRAA >60 06/27/2017 0843    No results found for: TOTALPROTELP, ALBUMINELP, A1GS, A2GS, BETS, BETA2SER, GAMS, MSPIKE, SPEI  No results found for: KPAFRELGTCHN, LAMBDASER, KAPLAMBRATIO  Lab Results  Component Value Date   WBC 3.3 (L) 10/05/2020   NEUTROABS 1.8 10/05/2020   HGB 10.3 (L) 10/05/2020   HCT 31.5 (L) 10/05/2020   MCV 90.0 10/05/2020   PLT 172 10/05/2020   No results found for: LABCA2  No components found for: INOMVE720  No results for input(s): INR in the last 168 hours.  No results found for: LABCA2  No results found for: NOB096  No results found for: GEZ662  No results found for: HUT654  No results found for: CA2729  No components found for: HGQUANT  No results found for: CEA1 / No results found for: CEA1   No results found for: AFPTUMOR  No results found for: CHROMOGRNA  No results found for: HGBA, HGBA2QUANT, HGBFQUANT, HGBSQUAN (Hemoglobinopathy evaluation)   No  results found for: LDH  Lab Results  Component Value Date   IRON 77 05/21/2018   TIBC 404 05/21/2018   IRONPCTSAT 19 (L) 05/21/2018   (Iron and TIBC)  Lab Results  Component Value Date   FERRITIN 24 05/21/2018    Urinalysis    Component Value Date/Time   COLORURINE COLORLESS (A) 08/09/2017 2224   APPEARANCEUR CLEAR 08/09/2017 2224   LABSPEC 1.004 (L) 08/09/2017 2224   PHURINE 6.0 08/09/2017 [place] 08/09/2017 2224   HGBUR NEGATIVE 08/09/2017 2224   BILIRUBINUR NEGATIVE 08/09/2017 2224   KETONESUR NEGATIVE 08/09/2017 2224   PROTEINUR NEGATIVE 08/09/2017 2224   NITRITE NEGATIVE 08/09/2017 2224   LEUKOCYTESUR NEGATIVE 08/09/2017 2224    STUDIES: No results found.   ELIGIBLE FOR AVAILABLE RESEARCH PROTOCOL: no   ASSESSMENT: 70 y.o. April [name] woman   RIGHT BREAST CANCER (0) status post right breast upper outer quadrant biopsy 06/20/2017 for a clinical T1b N0, stage IA invasive ductal carcinoma, grade 3, estrogen and progesterone receptor negative, but HER-2 amplified, with an MIB-1 of 30%  (1) genetics testing 07/05/2017 through the Common Hereditary Cancer Panel offered by [facility] found no deleterious mutations in APC, ATM, AXIN2, BARD1, BMPR1A, BRCA1, BRCA2, BRIP1, CDH1, CDKN2A (p14ARF), CDKN2A (p16INK4a), CKD4, CHEK2, CTNNA1, DICER1, EPCAM (Deletion/duplication testing only), GREM1 (promoter region deletion/duplication testing only), KIT, MEN1, MLH1, MSH2, MSH3, MSH6, MUTYH, NBN, NF1, NHTL1, PALB2, PDGFRA, PMS2, POLD1, POLE, PTEN, RAD50, RAD51C, RAD51D, SDHB, SDHC, SDHD, SMAD4, SMARCA4. STK11, TP53, TSC1, TSC2, and VHL.  The following genes were evaluated for sequence changes only: SDHA and HOXB13 c.251G>A variant only.  (a) a Variant of uncertain significance in MSH2 was identified c.1331G>T (p.Arg444Leu).   (2) status post right lumpectomy and sentinel lymph node sampling 07/17/2017 for a pT1c pN0, stage IA invasive ductal carcinoma,  grade 2, with negative margins.  A total of 5 lymph nodes were removed  (3) adjuvant chemotherapy consisting of paclitaxel weekly  x12 together with trastuzumab every 21 days starting 08/07/2017  (a) paclitaxel stopped after 8 doses because of neuropathy (last dose 09/25/2017  (4) trastuzumab continued to total 1 year (last dose 07/23/2018)  (a) echo 08/10/2017 showed an ejection fraction in the 65-70% range  (b) echo on 11/01/2017 shows EF of 60-65%  (c) cardiogram 02/05/2018 shows an ejection fraction in the 60-65% range.  (d) echocardiogram 05/28/2018 shows an ejection fraction in the 65-70%.  (5) adjuvant radiation 10/17/2017-11/14/2016: 40.05 Gy directed to the Right Breast in 15 fractions, followed by a boost of 10 Gy given in 5 fractions   (6) anemia, with normal MCV, ferritin, B12, folate, and inappropriately normal reticulocyte count, consistent with anemia of chronic illness.  LEFT BREAST CANCER: (7) status post left breast upper inner quadrant biopsy 06/15/2020 for a clinical T1 N0 invasive ductal carcinoma, grade 3, weakly estrogen receptor positive, progesterone receptor negative, HER2 amplified, with an MIB-1 of 45%.  (8) status post left lumpectomy and sentinel lymph node sampling 07/13/2020 for a pT1c pN0, stage IB invasive ductal carcinoma, grade 3, with negative margins  (a) a total of 5 axilla lymph nodes were removed, all benign  (b) repeat prognostic panel again weakly estrogen receptor positive, progesterone receptor negative, now HER-2 not amplified  (9) adjuvant chemotherapy and anti-HER2 immunotherapy to start 07/27/2020, consisting of CMF chemotherapy to be repeated every 21 days and trastuzumab to be continued for 1 year.  (a) echocardiogram 05/28/2020 shows an ejection fraction in the 65-70% range  (b) echo 08/13/2020 shows an ejection fraction in the 65 to 70% range  (c) cycle 4 of CMF delayed because of neutropenia; full [place] added with subsequent cycles  (d)  methotrexate omitted during radiation over 1 overlap  (10) trastuzumab to be continued a minimum of 1 year  (11) adjuvant radiation given concurrently with CMF to be completed 10/19/2020  (12) to start letrozole at the completion of local treatment   PLAN: Teresha's counts have recovered and we can go ahead and treat her today.  We have been able to obtain PEG fill gastrium for her on day 3.  She has a good understanding of the possible toxicities side effects and complications of this agent which frequently does cause bone pain and malaise with flulike symptoms.  However it we will keep her on time.  Her next treatment will be 10/26/2020 and we will resume methotrexate with that treatment  She will see me again in 6 weeks  She knows to call for any other issue that may develop before then  Total encounter time 25 minutes.*   Madina Galati, Virgie Dad, MD  10/05/20 12:57 PM Medical Oncology and Hematology [facility] [place], [place] [address] Tel. [phone]    Fax. [phone]   I, Wilburn Mylar, am acting as scribe for Dr. Virgie Dad. Shaylon Gillean.  I, Lurline Del MD, have reviewed the above documentation for accuracy and completeness, and I agree with the above.   *Total Encounter Time as defined by the Centers for Medicare and Medicaid Services includes, in addition to the face-to-face time of a patient visit (documented in the note above) non-face-to-face time: obtaining and reviewing outside history, ordering and reviewing medications, tests or procedures, care coordination (communications with other health care professionals or caregivers) and documentation in the medical record.

## 2020-10-05 ENCOUNTER — Inpatient Hospital Stay: Payer: Medicare PPO

## 2020-10-05 ENCOUNTER — Ambulatory Visit
Admission: RE | Admit: 2020-10-05 | Discharge: 2020-10-05 | Disposition: A | Discharge status: Home / Self Care | Payer: Medicare PPO | Source: Ambulatory Visit | Attending: Radiation Oncology | Admitting: Radiation Oncology

## 2020-10-05 ENCOUNTER — Inpatient Hospital Stay: Payer: Medicare PPO | Admitting: Oncology

## 2020-10-05 VITALS — BP 174/85 | HR 62 | Temp 97.2°F | Resp 18 | Ht 63.5 in | Wt 152.8 lb

## 2020-10-05 DIAGNOSIS — C50411 Malignant neoplasm of upper-outer quadrant of right female breast: Secondary | ICD-10-CM

## 2020-10-05 DIAGNOSIS — I10 Essential (primary) hypertension: Secondary | ICD-10-CM | POA: Diagnosis not present

## 2020-10-05 DIAGNOSIS — C50212 Malignant neoplasm of upper-inner quadrant of left female breast: Secondary | ICD-10-CM | POA: Diagnosis not present

## 2020-10-05 DIAGNOSIS — C50012 Malignant neoplasm of nipple and areola, left female breast: Secondary | ICD-10-CM

## 2020-10-05 DIAGNOSIS — Z171 Estrogen receptor negative status [ER-]: Secondary | ICD-10-CM | POA: Diagnosis not present

## 2020-10-05 DIAGNOSIS — Z5189 Encounter for other specified aftercare: Secondary | ICD-10-CM | POA: Diagnosis not present

## 2020-10-05 DIAGNOSIS — Z79899 Other long term (current) drug therapy: Secondary | ICD-10-CM | POA: Diagnosis not present

## 2020-10-05 DIAGNOSIS — Z923 Personal history of irradiation: Secondary | ICD-10-CM | POA: Diagnosis not present

## 2020-10-05 DIAGNOSIS — Z5111 Encounter for antineoplastic chemotherapy: Secondary | ICD-10-CM | POA: Diagnosis not present

## 2020-10-05 DIAGNOSIS — Z5112 Encounter for antineoplastic immunotherapy: Secondary | ICD-10-CM | POA: Diagnosis not present

## 2020-10-05 DIAGNOSIS — Z170 Estrogen receptor positive status [ER+]: Secondary | ICD-10-CM

## 2020-10-05 DIAGNOSIS — Z95828 Presence of other vascular implants and grafts: Secondary | ICD-10-CM

## 2020-10-05 LAB — CMP (CANCER CENTER ONLY)
ALT: 37 U/L (ref 0–44)
AST: 34 U/L (ref 15–41)
Albumin: 3.9 g/dL (ref 3.5–5.0)
Alkaline Phosphatase: 61 U/L (ref 38–126)
Anion gap: 11 (ref 5–15)
BUN: 13 mg/dL (ref 8–23)
CO2: 26 mmol/L (ref 22–32)
Calcium: 9.5 mg/dL (ref 8.9–10.3)
Chloride: 103 mmol/L (ref 98–111)
Creatinine: 1.02 mg/dL — ABNORMAL HIGH (ref 0.44–1.00)
GFR, Estimated: 59 mL/min — ABNORMAL LOW (ref >60)
Glucose, Bld: 91 mg/dL (ref 70–99)
Potassium: 4 mmol/L (ref 3.5–5.1)
Sodium: 140 mmol/L (ref 135–145)
Total Bilirubin: 0.3 mg/dL (ref 0.3–1.2)
Total Protein: 7.4 g/dL (ref 6.5–8.1)

## 2020-10-05 LAB — CBC WITH DIFFERENTIAL (CANCER CENTER ONLY)
Abs Immature Granulocytes: 0.01 10*3/uL (ref 0.00–0.07)
Basophils Absolute: 0 10*3/uL (ref 0.0–0.1)
Basophils Relative: 1 %
Eosinophils Absolute: 0.1 10*3/uL (ref 0.0–0.5)
Eosinophils Relative: 2 %
HCT: 31.5 % — ABNORMAL LOW (ref 36.0–46.0)
Hemoglobin: 10.3 g/dL — ABNORMAL LOW (ref 12.0–15.0)
Immature Granulocytes: 0 %
Lymphocytes Relative: 30 %
Lymphs Abs: 1 10*3/uL (ref 0.7–4.0)
MCH: 29.4 pg (ref 26.0–34.0)
MCHC: 32.7 g/dL (ref 30.0–36.0)
MCV: 90 fL (ref 80.0–100.0)
Monocytes Absolute: 0.4 10*3/uL (ref 0.1–1.0)
Monocytes Relative: 12 %
Neutro Abs: 1.8 10*3/uL (ref 1.7–7.7)
Neutrophils Relative %: 55 %
Platelet Count: 172 10*3/uL (ref 150–400)
RBC: 3.5 MIL/uL — ABNORMAL LOW (ref 3.87–5.11)
RDW: 14.4 % (ref 11.5–15.5)
WBC Count: 3.3 10*3/uL — ABNORMAL LOW (ref 4.0–10.5)
nRBC: 0 % (ref 0.0–0.2)

## 2020-10-05 MED ORDER — SODIUM CHLORIDE 0.9% FLUSH
10.0000 mL | Freq: Once | INTRAVENOUS | Status: AC
  Administered 2020-10-05: 10 mL
  Filled 2020-10-05: qty 10

## 2020-10-05 MED ORDER — TRASTUZUMAB-ANNS CHEMO 150 MG IV SOLR
6.0000 mg/kg | Freq: Once | INTRAVENOUS | Status: AC
  Administered 2020-10-05: 420 mg via INTRAVENOUS
  Filled 2020-10-05: qty 20

## 2020-10-05 MED ORDER — DIPHENHYDRAMINE HCL 25 MG PO CAPS
25.0000 mg | ORAL_CAPSULE | Freq: Once | ORAL | Status: AC
  Administered 2020-10-05: 25 mg via ORAL

## 2020-10-05 MED ORDER — FLUOROURACIL CHEMO INJECTION 2.5 GM/50ML
600.0000 mg/m2 | Freq: Once | INTRAVENOUS | Status: AC
  Administered 2020-10-05: 1050 mg via INTRAVENOUS
  Filled 2020-10-05: qty 21

## 2020-10-05 MED ORDER — SODIUM CHLORIDE 0.9 % IV SOLN
600.0000 mg/m2 | Freq: Once | INTRAVENOUS | Status: AC
  Administered 2020-10-05: 1060 mg via INTRAVENOUS
  Filled 2020-10-05: qty 53

## 2020-10-05 MED ORDER — HEPARIN SOD (PORK) LOCK FLUSH 100 UNIT/ML IV SOLN
500.0000 [IU] | Freq: Once | INTRAVENOUS | Status: AC | PRN
  Administered 2020-10-05: 500 [IU]
  Filled 2020-10-05: qty 5

## 2020-10-05 MED ORDER — ACETAMINOPHEN 325 MG PO TABS
ORAL_TABLET | ORAL | Status: AC
  Filled 2020-10-05: qty 2

## 2020-10-05 MED ORDER — SODIUM CHLORIDE 0.9% FLUSH
10.0000 mL | INTRAVENOUS | Status: DC | PRN
  Administered 2020-10-05: 10 mL
  Filled 2020-10-05: qty 10

## 2020-10-05 MED ORDER — ACETAMINOPHEN 325 MG PO TABS
650.0000 mg | ORAL_TABLET | Freq: Once | ORAL | Status: AC
  Administered 2020-10-05: 650 mg via ORAL

## 2020-10-05 MED ORDER — DEXAMETHASONE SODIUM PHOSPHATE 100 MG/10ML IJ SOLN
10.0000 mg | Freq: Once | INTRAMUSCULAR | Status: AC
  Administered 2020-10-05: 10 mg via INTRAVENOUS
  Filled 2020-10-05: qty 10
  Filled 2020-10-05: qty 1

## 2020-10-05 MED ORDER — RADIAPLEXRX EX GEL: Freq: Once | CUTANEOUS | Status: AC

## 2020-10-05 MED ORDER — PALONOSETRON HCL INJECTION 0.25 MG/5ML
INTRAVENOUS | Status: AC
  Filled 2020-10-05: qty 5

## 2020-10-05 MED ORDER — PALONOSETRON HCL INJECTION 0.25 MG/5ML
0.2500 mg | Freq: Once | INTRAVENOUS | Status: AC
  Administered 2020-10-05: 0.25 mg via INTRAVENOUS

## 2020-10-05 MED ORDER — SODIUM CHLORIDE 0.9 % IV SOLN
Freq: Once | INTRAVENOUS | Status: AC
Start: 2020-10-05 — End: 2020-10-05
  Filled 2020-10-05: qty 250

## 2020-10-05 MED ORDER — DIPHENHYDRAMINE HCL 25 MG PO CAPS
ORAL_CAPSULE | ORAL | Status: AC
  Filled 2020-10-05: qty 1

## 2020-10-05 NOTE — Patient Instructions (Signed)
[facility] Discharge Instructions for Patients Receiving Chemotherapy  Today you received the following chemotherapy agents Trastuzumab, cytoxan and 42fu  To help prevent nausea and vomiting after your treatment, we encourage you to take your nausea medication as directed   If you develop nausea and vomiting that is not controlled by your nausea medication, call the clinic.   BELOW ARE SYMPTOMS THAT SHOULD BE REPORTED IMMEDIATELY:  *FEVER GREATER THAN 100.5 F  *CHILLS WITH OR WITHOUT FEVER  NAUSEA AND VOMITING THAT IS NOT CONTROLLED WITH YOUR NAUSEA MEDICATION  *UNUSUAL SHORTNESS OF BREATH  *UNUSUAL BRUISING OR BLEEDING  TENDERNESS IN MOUTH AND THROAT WITH OR WITHOUT PRESENCE OF ULCERS  *URINARY PROBLEMS  *BOWEL PROBLEMS  UNUSUAL RASH Items with * indicate a potential emergency and should be followed up as soon as possible.  Feel free to call the clinic should you have any questions or concerns. The clinic phone number is [phone].  Please show the [facility] at check-in to the Emergency Department and triage nurse.

## 2020-10-05 NOTE — Patient Instructions (Signed)
Implanted Port Insertion, Care After This sheet gives you information about how to care for yourself after your procedure. Your health care provider may also give you more specific instructions. If you have problems or questions, contact your health care provider. What can I expect after the procedure? After the procedure, it is common to have:  Discomfort at the port insertion site.  Bruising on the skin over the port. This should improve over 3-4 days. Follow these instructions at home: Port care  After your port is placed, you will get a manufacturer's information card. The card has information about your port. Keep this card with you at all times.  Take care of the port as told by your health care provider. Ask your health care provider if you or a family member can get training for taking care of the port at home. A home health care nurse may also take care of the port.  Make sure to remember what type of port you have. Incision care  Follow instructions from your health care provider about how to take care of your port insertion site. Make sure you: ? Wash your hands with soap and water before and after you change your bandage (dressing). If soap and water are not available, use hand sanitizer. ? Change your dressing as told by your health care provider. ? Leave stitches (sutures), skin glue, or adhesive strips in place. These skin closures may need to stay in place for 2 weeks or longer. If adhesive strip edges start to loosen and curl up, you may trim the loose edges. Do not remove adhesive strips completely unless your health care provider tells you to do that.  Check your port insertion site every day for signs of infection. Check for: ? Redness, swelling, or pain. ? Fluid or blood. ? Warmth. ? Pus or a bad smell.      Activity  Return to your normal activities as told by your health care provider. Ask your health care provider what activities are safe for you.  Do not  lift anything that is heavier than 10 lb (4.5 kg), or the limit that you are told, until your health care provider says that it is safe. General instructions  Take over-the-counter and prescription medicines only as told by your health care provider.  Do not take baths, swim, or use a hot tub until your health care provider approves. Ask your health care provider if you may take showers. You may only be allowed to take sponge baths.  Do not drive for 24 hours if you were given a sedative during your procedure.  Wear a medical alert bracelet in case of an emergency. This will tell any health care providers that you have a port.  Keep all follow-up visits as told by your health care provider. This is important. Contact a health care provider if:  You cannot flush your port with saline as directed, or you cannot draw blood from the port.  You have a fever or chills.  You have redness, swelling, or pain around your port insertion site.  You have fluid or blood coming from your port insertion site.  Your port insertion site feels warm to the touch.  You have pus or a bad smell coming from the port insertion site. Get help right away if:  You have chest pain or shortness of breath.  You have bleeding from your port that you cannot control. Summary  Take care of the port as told by your   health care provider. Keep the manufacturer's information card with you at all times.  Change your dressing as told by your health care provider.  Contact a health care provider if you have a fever or chills or if you have redness, swelling, or pain around your port insertion site.  Keep all follow-up visits as told by your health care provider. This information is not intended to replace advice given to you by your health care provider. Make sure you discuss any questions you have with your health care provider. Document Revised: 01/01/2018 Document Reviewed: 01/01/2018 Elsevier Patient Education   2021 [employer]  

## 2020-10-06 ENCOUNTER — Ambulatory Visit
Admission: RE | Admit: 2020-10-06 | Discharge: 2020-10-06 | Disposition: A | Discharge status: Home / Self Care | Payer: Medicare PPO | Source: Ambulatory Visit | Attending: Radiation Oncology | Admitting: Radiation Oncology

## 2020-10-06 DIAGNOSIS — C50012 Malignant neoplasm of nipple and areola, left female breast: Secondary | ICD-10-CM | POA: Diagnosis not present

## 2020-10-06 DIAGNOSIS — Z170 Estrogen receptor positive status [ER+]: Secondary | ICD-10-CM | POA: Diagnosis not present

## 2020-10-06 DIAGNOSIS — C50212 Malignant neoplasm of upper-inner quadrant of left female breast: Secondary | ICD-10-CM | POA: Diagnosis not present

## 2020-10-07 ENCOUNTER — Inpatient Hospital Stay: Payer: Medicare PPO

## 2020-10-07 ENCOUNTER — Ambulatory Visit
Admission: RE | Admit: 2020-10-07 | Discharge: 2020-10-07 | Disposition: A | Discharge status: Home / Self Care | Payer: Medicare PPO | Source: Ambulatory Visit | Attending: Radiation Oncology | Admitting: Radiation Oncology

## 2020-10-07 ENCOUNTER — Other Ambulatory Visit: Payer: Self-pay

## 2020-10-07 VITALS — BP 169/73 | HR 64 | Resp 18

## 2020-10-07 DIAGNOSIS — C50012 Malignant neoplasm of nipple and areola, left female breast: Secondary | ICD-10-CM | POA: Diagnosis not present

## 2020-10-07 DIAGNOSIS — Z171 Estrogen receptor negative status [ER-]: Secondary | ICD-10-CM

## 2020-10-07 DIAGNOSIS — Z923 Personal history of irradiation: Secondary | ICD-10-CM | POA: Diagnosis not present

## 2020-10-07 DIAGNOSIS — C50411 Malignant neoplasm of upper-outer quadrant of right female breast: Secondary | ICD-10-CM

## 2020-10-07 DIAGNOSIS — Z5111 Encounter for antineoplastic chemotherapy: Secondary | ICD-10-CM | POA: Diagnosis not present

## 2020-10-07 DIAGNOSIS — Z79899 Other long term (current) drug therapy: Secondary | ICD-10-CM | POA: Diagnosis not present

## 2020-10-07 DIAGNOSIS — Z170 Estrogen receptor positive status [ER+]: Secondary | ICD-10-CM | POA: Diagnosis not present

## 2020-10-07 DIAGNOSIS — C50212 Malignant neoplasm of upper-inner quadrant of left female breast: Secondary | ICD-10-CM | POA: Diagnosis not present

## 2020-10-07 DIAGNOSIS — Z5112 Encounter for antineoplastic immunotherapy: Secondary | ICD-10-CM | POA: Diagnosis not present

## 2020-10-07 DIAGNOSIS — I10 Essential (primary) hypertension: Secondary | ICD-10-CM | POA: Diagnosis not present

## 2020-10-07 DIAGNOSIS — Z5189 Encounter for other specified aftercare: Secondary | ICD-10-CM | POA: Diagnosis not present

## 2020-10-07 MED ORDER — PEGFILGRASTIM-JMDB 6 MG/0.6ML ~~LOC~~ SOSY
PREFILLED_SYRINGE | SUBCUTANEOUS | Status: AC
  Filled 2020-10-07: qty 0.6

## 2020-10-07 MED ORDER — PEGFILGRASTIM-JMDB 6 MG/0.6ML ~~LOC~~ SOSY
6.0000 mg | PREFILLED_SYRINGE | Freq: Once | SUBCUTANEOUS | Status: AC
  Administered 2020-10-07: 6 mg via SUBCUTANEOUS

## 2020-10-07 NOTE — Patient Instructions (Signed)
Pegfilgrastim injection What is this medicine? PEGFILGRASTIM (PEG fil gra stim) is a long-acting granulocyte colony-stimulating factor that stimulates the growth of neutrophils, a type of white blood cell important in the body's fight against infection. It is used to reduce the incidence of fever and infection in patients with certain types of cancer who are receiving chemotherapy that affects the bone marrow, and to increase survival after being exposed to high doses of radiation. This medicine may be used for other purposes; ask your health care provider or pharmacist if you have questions. COMMON BRAND NAME(S): Fulphila, Neulasta, Nyvepria, UDENYCA, Ziextenzo What should I tell my health care provider before I take this medicine? They need to know if you have any of these conditions:  kidney disease  latex allergy  ongoing radiation therapy  sickle cell disease  skin reactions to acrylic adhesives (On-Body Injector only)  an unusual or allergic reaction to pegfilgrastim, filgrastim, other medicines, foods, dyes, or preservatives  pregnant or trying to get pregnant  breast-feeding How should I use this medicine? This medicine is for injection under the skin. If you get this medicine at home, you will be taught how to prepare and give the pre-filled syringe or how to use the On-body Injector. Refer to the patient Instructions for Use for detailed instructions. Use exactly as directed. Tell your healthcare provider immediately if you suspect that the On-body Injector may not have performed as intended or if you suspect the use of the On-body Injector resulted in a missed or partial dose. It is important that you put your used needles and syringes in a special sharps container. Do not put them in a trash can. If you do not have a sharps container, call your pharmacist or healthcare provider to get one. Talk to your pediatrician regarding the use of this medicine in children. While this drug  may be prescribed for selected conditions, precautions do apply. Overdosage: If you think you have taken too much of this medicine contact a poison control center or emergency room at once. NOTE: This medicine is only for you. Do not share this medicine with others. What if I miss a dose? It is important not to miss your dose. Call your doctor or health care professional if you miss your dose. If you miss a dose due to an On-body Injector failure or leakage, a new dose should be administered as soon as possible using a single prefilled syringe for manual use. What may interact with this medicine? Interactions have not been studied. This list may not describe all possible interactions. Give your health care provider a list of all the medicines, herbs, non-prescription drugs, or dietary supplements you use. Also tell them if you smoke, drink alcohol, or use illegal drugs. Some items may interact with your medicine. What should I watch for while using this medicine? Your condition will be monitored carefully while you are receiving this medicine. You may need blood work done while you are taking this medicine. Talk to your health care provider about your risk of cancer. You may be more at risk for certain types of cancer if you take this medicine. If you are going to need a MRI, CT scan, or other procedure, tell your doctor that you are using this medicine (On-Body Injector only). What side effects may I notice from receiving this medicine? Side effects that you should report to your doctor or health care professional as soon as possible:  allergic reactions (skin rash, itching or hives, swelling of   the face, lips, or tongue)  back pain  dizziness  fever  pain, redness, or irritation at site where injected  pinpoint red spots on the skin  red or dark-brown urine  shortness of breath or breathing problems  stomach or side pain, or pain at the shoulder  swelling  tiredness  trouble  passing urine or change in the amount of urine  unusual bruising or bleeding Side effects that usually do not require medical attention (report to your doctor or health care professional if they continue or are bothersome):  bone pain  muscle pain This list may not describe all possible side effects. Call your doctor for medical advice about side effects. You may report side effects to FDA at 1-800-FDA-1088. Where should I keep my medicine? Keep out of the reach of children. If you are using this medicine at home, you will be instructed on how to store it. Throw away any unused medicine after the expiration date on the label. NOTE: This sheet is a summary. It may not cover all possible information. If you have questions about this medicine, talk to your doctor, pharmacist, or health care provider.  2021 Elsevier/Gold Standard (2019-06-27 13:20:51)  

## 2020-10-08 ENCOUNTER — Ambulatory Visit
Admission: RE | Admit: 2020-10-08 | Discharge: 2020-10-08 | Disposition: A | Discharge status: Home / Self Care | Payer: Medicare PPO | Source: Ambulatory Visit | Attending: Radiation Oncology | Admitting: Radiation Oncology

## 2020-10-08 DIAGNOSIS — Z170 Estrogen receptor positive status [ER+]: Secondary | ICD-10-CM | POA: Diagnosis not present

## 2020-10-08 DIAGNOSIS — C50012 Malignant neoplasm of nipple and areola, left female breast: Secondary | ICD-10-CM | POA: Diagnosis not present

## 2020-10-08 DIAGNOSIS — C50212 Malignant neoplasm of upper-inner quadrant of left female breast: Secondary | ICD-10-CM | POA: Diagnosis not present

## 2020-10-11 ENCOUNTER — Ambulatory Visit
Admission: RE | Admit: 2020-10-11 | Discharge: 2020-10-11 | Disposition: A | Discharge status: Home / Self Care | Payer: Medicare PPO | Source: Ambulatory Visit | Attending: Radiation Oncology | Admitting: Radiation Oncology

## 2020-10-11 ENCOUNTER — Other Ambulatory Visit: Payer: Self-pay

## 2020-10-11 ENCOUNTER — Ambulatory Visit: Payer: Medicare PPO

## 2020-10-11 ENCOUNTER — Ambulatory Visit: Payer: Medicare PPO | Admitting: Radiation Oncology

## 2020-10-11 DIAGNOSIS — C50412 Malignant neoplasm of upper-outer quadrant of left female breast: Secondary | ICD-10-CM

## 2020-10-11 DIAGNOSIS — Z170 Estrogen receptor positive status [ER+]: Secondary | ICD-10-CM | POA: Diagnosis not present

## 2020-10-11 DIAGNOSIS — C50212 Malignant neoplasm of upper-inner quadrant of left female breast: Secondary | ICD-10-CM | POA: Diagnosis not present

## 2020-10-11 DIAGNOSIS — C50012 Malignant neoplasm of nipple and areola, left female breast: Secondary | ICD-10-CM | POA: Diagnosis not present

## 2020-10-11 NOTE — Therapy (Signed)
[place], [place], [address] Phone: [phone]   Fax:  [phone]  Physical Therapy Treatment  Patient Details  Name: April [name] MRN: [id number] Date of Birth: [birth year] Referring Provider (PT): Dr. Donne Hazel   Encounter Date: 10/11/2020   PT End of Session - 10/11/20 0943    Visit Number 10   # unchanged due to screen only   Number of Visits 15    Date for PT Re-Evaluation 10/14/20    PT Start Time 0934    PT Stop Time 0943    PT Time Calculation (min) 9 min    Activity Tolerance Patient tolerated treatment well    Behavior During Therapy [facility] for tasks assessed/performed           Past Medical History:  Diagnosis Date  . Breast cancer ([place]) 05/2017   right breast  . Eczema   . Family history of breast cancer   . Family history of ovarian cancer   . History of radiation therapy 10/17/17- 11/14/17   40.05 directed to the right breast in 15 fractions, followed by a boost of 10 gy given in 5 fractions.   . Hypertension    toxemia during pregnancy, no meds now    Past Surgical History:  Procedure Laterality Date  . ABDOMINAL HYSTERECTOMY    . BREAST LUMPECTOMY WITH RADIOACTIVE SEED AND SENTINEL LYMPH NODE BIOPSY Right 07/17/2017   Procedure: BREAST LUMPECTOMY WITH RADIOACTIVE SEED AND SENTINEL LYMPH NODE BIOPSY;  Surgeon: Rolm Bookbinder, MD;  Location: [place];  Service: General;  Laterality: Right;  . BREAST LUMPECTOMY WITH RADIOACTIVE SEED AND SENTINEL LYMPH NODE BIOPSY Left 07/13/2020   Procedure: LEFT BREAST LUMPECTOMY WITH RADIOACTIVE SEED AND LEFT AXILLARY SENTINEL LYMPH NODE BIOPSY;  Surgeon: Rolm Bookbinder, MD;  Location: [place];  Service: General;  Laterality: Left;  . CESAREAN SECTION     x4  . DILATION AND CURETTAGE OF UTERUS    . KNEE SURGERY Right   . PORT-A-CATH REMOVAL Right 09/04/2018   Procedure: REMOVAL PORT-A-CATH;   Surgeon: Rolm Bookbinder, MD;  Location: [place];  Service: General;  Laterality: Right;  . PORTACATH PLACEMENT N/A 07/17/2017   Procedure: INSERTION PORT-A-CATH WITH [place];  Surgeon: Rolm Bookbinder, MD;  Location: [place];  Service: General;  Laterality: N/A;  . PORTACATH PLACEMENT Right 07/13/2020   Procedure: INSERTION PORT-A-CATH WITH ULTRASOUND GUIDANCE;  Surgeon: Rolm Bookbinder, MD;  Location: [place];  Service: General;  Laterality: Right;    There were no vitals filed for this visit.   Subjective Assessment - 10/11/20 0937    Subjective Pt returns for her 3 month L-Dex screen.    Pertinent History Pt with prior history of right breast cancer  with  with surgery on 07/17/17 for right lumpectomy and SLNB with 5 LN removed.  Had chemo and radiation. Most recent surgery1/25/22 for left breast lumpectomy with 0/5 positive nodes. She will have chemo, radiation and infusions for immunotherapy.                  L-DEX FLOWSHEETS - 10/11/20 0900      L-DEX LYMPHEDEMA SCREENING   Measurement Type Bilateral    L-DEX MEASUREMENT EXTREMITY Upper Extremity    POSITION  Standing    DOMINANT SIDE Right    At Risk Side Left    BASELINE SCORE (UNILATERAL) -0.2    BASELINE RIGHT 1.1    BASELINE  LEFT -0.2    RIGHT SIDE SCORE 1.4    LEFT SIDE SCORE 0.4    VALUE CHANGE RIGHT 0.3    VALUE CHANGE LEFT 0.6                                  PT Long Term Goals - 09/02/20 1522      PT LONG TERM GOAL #1   Title Patient will demonstrate she has returned to baseline since surgery related to shoulder ROM and function.    Time 4    Period Weeks    Status Achieved      PT LONG TERM GOAL #2   Title Pt will be independent in left breast MLD    Time 3    Period Weeks    Status Achieved      PT LONG TERM GOAL #3   Title Pt will report decreased breast heaviness by 50% or greater    Baseline none at all     Time 4    Period Weeks    Status Achieved      PT LONG TERM GOAL #4   Title Pt will be independent with scar massage    Time 2    Period Weeks    Status Achieved      PT LONG TERM GOAL #5   Title Pt will be fit for compression sleeve and will be independent in its use    Baseline received today but has not worn yet.    Time 2    Period Weeks    Status On-going      Additional Long Term Goals   Additional Long Term Goals Yes      PT LONG TERM GOAL #6   Title Pt will maintain left shoulder ROM throughout radiation    Time 6    Period Weeks    Status New    Target Date 10/14/20                 Plan - 10/11/20 0943    Clinical Impression Statement Pt returns for her 3 month L-Dex screen. Her changes from baseline of 0.6 in Lt arm and 0.3 in her Rt arm are WNLs so no further treatment is required at this time except to cont every 3 month L-Dex screens. She is scheudled for a follow up visit with [place] after she completes radiation in 2 weeks to reassess her breast.    PT Next Visit Plan Reassess shoulder ROM, breast swelling as pt goes through radiation.  Pt to come next visit after she completes radiation; cont every 3 month L-Dex screens for up to 2 years from her Rt SLNB.    Consulted and Agree with Plan of Care Patient           Patient will benefit from skilled therapeutic intervention in order to improve the following deficits and impairments:     Visit Diagnosis: Malignant neoplasm of upper-outer quadrant of left female breast, unspecified estrogen receptor status ([place])     Problem List Patient Active Problem List   Diagnosis Date Noted  . Age-related osteoporosis without current pathological fracture 09/08/2020  . Allergic rhinitis 09/08/2020  . Palpitations 09/08/2020  . Personal history of malignant neoplasm of breast 09/08/2020  . Personal history of transient ischemic attack (TIA), and cerebral infarction without residual deficits 09/08/2020  . Pure  hypercholesterolemia 09/08/2020  . Vitamin D deficiency  09/08/2020  . Neuropathy due to chemotherapeutic drug ([place]) 07/23/2018  . Port-A-Cath in place 08/28/2017  . TIA (transient ischemic attack) 08/09/2017  . Hypertension   . Genetic testing 07/06/2017  . Family history of ovarian cancer   . Family history of breast cancer   . Malignant neoplasm of upper-outer quadrant of right breast in female, estrogen receptor negative ([place]) 06/26/2017  . Malignant neoplasm of left breast, estrogen receptor positive ([place]) 05/19/2017    Otelia Limes, PTA 10/11/2020, 9:47 AM  [place], [place], [address] Phone: [phone]   Fax:  [phone]  Name: April [name] MRN: [id number] Date of Birth: Oct 03, 1950

## 2020-10-12 ENCOUNTER — Ambulatory Visit
Admission: RE | Admit: 2020-10-12 | Discharge: 2020-10-12 | Disposition: A | Discharge status: Home / Self Care | Payer: Medicare PPO | Source: Ambulatory Visit | Attending: Radiation Oncology | Admitting: Radiation Oncology

## 2020-10-12 DIAGNOSIS — Z170 Estrogen receptor positive status [ER+]: Secondary | ICD-10-CM | POA: Diagnosis not present

## 2020-10-12 DIAGNOSIS — C50012 Malignant neoplasm of nipple and areola, left female breast: Secondary | ICD-10-CM | POA: Diagnosis not present

## 2020-10-12 DIAGNOSIS — C50212 Malignant neoplasm of upper-inner quadrant of left female breast: Secondary | ICD-10-CM | POA: Diagnosis not present

## 2020-10-13 ENCOUNTER — Other Ambulatory Visit: Payer: Self-pay

## 2020-10-13 ENCOUNTER — Ambulatory Visit
Admission: RE | Admit: 2020-10-13 | Discharge: 2020-10-13 | Disposition: A | Discharge status: Home / Self Care | Payer: Medicare PPO | Source: Ambulatory Visit | Attending: Radiation Oncology | Admitting: Radiation Oncology

## 2020-10-13 ENCOUNTER — Ambulatory Visit: Payer: Medicare PPO

## 2020-10-13 DIAGNOSIS — Z170 Estrogen receptor positive status [ER+]: Secondary | ICD-10-CM | POA: Diagnosis not present

## 2020-10-13 DIAGNOSIS — C50212 Malignant neoplasm of upper-inner quadrant of left female breast: Secondary | ICD-10-CM | POA: Diagnosis not present

## 2020-10-13 DIAGNOSIS — C50012 Malignant neoplasm of nipple and areola, left female breast: Secondary | ICD-10-CM | POA: Diagnosis not present

## 2020-10-14 ENCOUNTER — Ambulatory Visit: Payer: Medicare PPO

## 2020-10-14 ENCOUNTER — Ambulatory Visit
Admission: RE | Admit: 2020-10-14 | Discharge: 2020-10-14 | Disposition: A | Discharge status: Home / Self Care | Payer: Medicare PPO | Source: Ambulatory Visit | Attending: Radiation Oncology | Admitting: Radiation Oncology

## 2020-10-14 DIAGNOSIS — C50012 Malignant neoplasm of nipple and areola, left female breast: Secondary | ICD-10-CM | POA: Diagnosis not present

## 2020-10-14 DIAGNOSIS — Z170 Estrogen receptor positive status [ER+]: Secondary | ICD-10-CM | POA: Diagnosis not present

## 2020-10-14 DIAGNOSIS — C50212 Malignant neoplasm of upper-inner quadrant of left female breast: Secondary | ICD-10-CM | POA: Diagnosis not present

## 2020-10-15 ENCOUNTER — Ambulatory Visit
Admission: RE | Admit: 2020-10-15 | Discharge: 2020-10-15 | Disposition: A | Discharge status: Home / Self Care | Payer: Medicare PPO | Source: Ambulatory Visit | Attending: Radiation Oncology | Admitting: Radiation Oncology

## 2020-10-15 ENCOUNTER — Other Ambulatory Visit: Payer: Self-pay

## 2020-10-15 DIAGNOSIS — Z170 Estrogen receptor positive status [ER+]: Secondary | ICD-10-CM | POA: Diagnosis not present

## 2020-10-15 DIAGNOSIS — C50212 Malignant neoplasm of upper-inner quadrant of left female breast: Secondary | ICD-10-CM | POA: Diagnosis not present

## 2020-10-15 DIAGNOSIS — C50012 Malignant neoplasm of nipple and areola, left female breast: Secondary | ICD-10-CM | POA: Diagnosis not present

## 2020-10-18 ENCOUNTER — Encounter: Payer: Self-pay | Admitting: *Deleted

## 2020-10-18 ENCOUNTER — Ambulatory Visit
Admission: RE | Admit: 2020-10-18 | Discharge: 2020-10-18 | Disposition: A | Discharge status: Home / Self Care | Payer: Medicare PPO | Source: Ambulatory Visit | Attending: Radiation Oncology | Admitting: Radiation Oncology

## 2020-10-18 DIAGNOSIS — Z170 Estrogen receptor positive status [ER+]: Secondary | ICD-10-CM | POA: Insufficient documentation

## 2020-10-18 DIAGNOSIS — C50012 Malignant neoplasm of nipple and areola, left female breast: Secondary | ICD-10-CM | POA: Diagnosis not present

## 2020-10-18 DIAGNOSIS — C50212 Malignant neoplasm of upper-inner quadrant of left female breast: Secondary | ICD-10-CM | POA: Diagnosis not present

## 2020-10-19 ENCOUNTER — Ambulatory Visit
Admission: RE | Admit: 2020-10-19 | Discharge: 2020-10-19 | Disposition: A | Discharge status: Home / Self Care | Payer: Medicare PPO | Source: Ambulatory Visit | Attending: Radiation Oncology | Admitting: Radiation Oncology

## 2020-10-19 ENCOUNTER — Ambulatory Visit: Payer: Medicare PPO

## 2020-10-19 ENCOUNTER — Other Ambulatory Visit: Payer: Medicare PPO

## 2020-10-19 ENCOUNTER — Other Ambulatory Visit: Payer: Self-pay

## 2020-10-19 DIAGNOSIS — C50012 Malignant neoplasm of nipple and areola, left female breast: Secondary | ICD-10-CM | POA: Diagnosis not present

## 2020-10-19 DIAGNOSIS — C50212 Malignant neoplasm of upper-inner quadrant of left female breast: Secondary | ICD-10-CM | POA: Diagnosis not present

## 2020-10-19 DIAGNOSIS — Z170 Estrogen receptor positive status [ER+]: Secondary | ICD-10-CM | POA: Diagnosis not present

## 2020-10-20 ENCOUNTER — Ambulatory Visit
Admission: RE | Admit: 2020-10-20 | Discharge: 2020-10-20 | Disposition: A | Discharge status: Home / Self Care | Payer: Medicare PPO | Source: Ambulatory Visit | Attending: Radiation Oncology | Admitting: Radiation Oncology

## 2020-10-20 ENCOUNTER — Encounter: Payer: Self-pay | Admitting: Radiation Oncology

## 2020-10-20 DIAGNOSIS — C50012 Malignant neoplasm of nipple and areola, left female breast: Secondary | ICD-10-CM | POA: Diagnosis not present

## 2020-10-20 DIAGNOSIS — Z170 Estrogen receptor positive status [ER+]: Secondary | ICD-10-CM | POA: Diagnosis not present

## 2020-10-20 DIAGNOSIS — C50212 Malignant neoplasm of upper-inner quadrant of left female breast: Secondary | ICD-10-CM | POA: Diagnosis not present

## 2020-10-22 DIAGNOSIS — Z8673 Personal history of transient ischemic attack (TIA), and cerebral infarction without residual deficits: Secondary | ICD-10-CM | POA: Diagnosis not present

## 2020-10-22 DIAGNOSIS — C50912 Malignant neoplasm of unspecified site of left female breast: Secondary | ICD-10-CM | POA: Diagnosis not present

## 2020-10-22 DIAGNOSIS — Z1389 Encounter for screening for other disorder: Secondary | ICD-10-CM | POA: Diagnosis not present

## 2020-10-22 DIAGNOSIS — C50919 Malignant neoplasm of unspecified site of unspecified female breast: Secondary | ICD-10-CM | POA: Diagnosis not present

## 2020-10-22 DIAGNOSIS — M810 Age-related osteoporosis without current pathological fracture: Secondary | ICD-10-CM | POA: Diagnosis not present

## 2020-10-22 DIAGNOSIS — I10 Essential (primary) hypertension: Secondary | ICD-10-CM | POA: Diagnosis not present

## 2020-10-22 DIAGNOSIS — E559 Vitamin D deficiency, unspecified: Secondary | ICD-10-CM | POA: Diagnosis not present

## 2020-10-22 DIAGNOSIS — Z0000 Encounter for general adult medical examination without abnormal findings: Secondary | ICD-10-CM | POA: Diagnosis not present

## 2020-10-22 DIAGNOSIS — E7800 Pure hypercholesterolemia, unspecified: Secondary | ICD-10-CM | POA: Diagnosis not present

## 2020-10-26 ENCOUNTER — Ambulatory Visit: Payer: Medicare PPO | Attending: Oncology

## 2020-10-26 ENCOUNTER — Other Ambulatory Visit: Payer: Self-pay

## 2020-10-26 DIAGNOSIS — R293 Abnormal posture: Secondary | ICD-10-CM | POA: Insufficient documentation

## 2020-10-26 DIAGNOSIS — C50412 Malignant neoplasm of upper-outer quadrant of left female breast: Secondary | ICD-10-CM | POA: Insufficient documentation

## 2020-10-26 DIAGNOSIS — R609 Edema, unspecified: Secondary | ICD-10-CM | POA: Insufficient documentation

## 2020-10-26 NOTE — Therapy (Signed)
[place], [place], [address] Phone: [phone]   Fax:  [phone]  Physical Therapy Treatment  Patient Details  Name: April [name] MRN: [id number] Date of Birth: 01-Mar-1951 Referring Provider (PT): Dr. Donne Hazel   Encounter Date: 10/26/2020   PT End of Session - 10/26/20 1201    Visit Number 11    Number of Visits 15    Date for PT Re-Evaluation 10/26/20    PT Start Time 1106    PT Stop Time 1200    PT Time Calculation (min) 54 min    Activity Tolerance Patient tolerated treatment well    Behavior During Therapy [facility] for tasks assessed/performed           Past Medical History:  Diagnosis Date  . Breast cancer ([place]) 05/2017   right breast  . Eczema   . Family history of breast cancer   . Family history of ovarian cancer   . History of radiation therapy 10/17/17- 11/14/17   40.05 directed to the right breast in 15 fractions, followed by a boost of 10 gy given in 5 fractions.   . Hypertension    toxemia during pregnancy, no meds now    Past Surgical History:  Procedure Laterality Date  . ABDOMINAL HYSTERECTOMY    . BREAST LUMPECTOMY WITH RADIOACTIVE SEED AND SENTINEL LYMPH NODE BIOPSY Right 07/17/2017   Procedure: BREAST LUMPECTOMY WITH RADIOACTIVE SEED AND SENTINEL LYMPH NODE BIOPSY;  Surgeon: Rolm Bookbinder, MD;  Location: [place];  Service: General;  Laterality: Right;  . BREAST LUMPECTOMY WITH RADIOACTIVE SEED AND SENTINEL LYMPH NODE BIOPSY Left 07/13/2020   Procedure: LEFT BREAST LUMPECTOMY WITH RADIOACTIVE SEED AND LEFT AXILLARY SENTINEL LYMPH NODE BIOPSY;  Surgeon: Rolm Bookbinder, MD;  Location: [place];  Service: General;  Laterality: Left;  . CESAREAN SECTION     x4  . DILATION AND CURETTAGE OF UTERUS    . KNEE SURGERY Right   . PORT-A-CATH REMOVAL Right 09/04/2018   Procedure: REMOVAL PORT-A-CATH;  Surgeon: Rolm Bookbinder, MD;   Location: [place];  Service: General;  Laterality: Right;  . PORTACATH PLACEMENT N/A 07/17/2017   Procedure: INSERTION PORT-A-CATH WITH [place];  Surgeon: Rolm Bookbinder, MD;  Location: [place];  Service: General;  Laterality: N/A;  . PORTACATH PLACEMENT Right 07/13/2020   Procedure: INSERTION PORT-A-CATH WITH ULTRASOUND GUIDANCE;  Surgeon: Rolm Bookbinder, MD;  Location: [place];  Service: General;  Laterality: Right;    There were no vitals filed for this visit.   Subjective Assessment - 10/26/20 1107    Subjective I finished the radiation last week and I will finish Chemo on July 12, but will have continue immunotherapy until January 2023.  Shoulder ROM has continued to do well.  There has been some swelling but I have done the drainage which helps.  Feel a little heavier on that side until I do the lymph drainage and everything softens. I have some eczema that shows up in various places and it itches.  I have some eczema on right side of chest and its itchy.  Left side is a little itchy from radiation.    Pertinent History Pt with prior history of right breast cancer  with  with surgery on 07/17/17 for right lumpectomy and SLNB with 5 LN removed.  Had chemo and radiation. Most recent surgery1/25/22 for left breast lumpectomy with 0/5 positive nodes. She will have chemo, radiation and infusions  for immunotherapy.    Patient Stated Goals Want to decrease tightness in axilla and knot in incision region., decrease swelling in left breast    Currently in Pain? No/denies    Pain Score 0-No pain              [facility] PT Assessment - 10/26/20 0001      Assessment   Medical Diagnosis Left Breast Cancer    Referring Provider (PT) Dr. Donne Hazel    Onset Date/Surgical Date 07/13/20      AROM   Left Shoulder Extension 61 Degrees    Left Shoulder Flexion 170 Degrees    Left Shoulder ABduction 180 Degrees    Left Shoulder External Rotation 90  Degrees             LYMPHEDEMA/ONCOLOGY QUESTIONNAIRE - 10/26/20 0001      Right Upper Extremity Lymphedema   15 cm Proximal to Olecranon Process 31.8 cm    10 cm Proximal to Olecranon Process 29 cm    Olecranon Process 24.5 cm    15 cm Proximal to Ulnar Styloid Process 21.8 cm    10 cm Proximal to Ulnar Styloid Process 17.7 cm    Just Proximal to Ulnar Styloid Process 14.1 cm    Across Hand at [employer] 17.8 cm    At [place] of 2nd Digit 5.8 cm      Left Upper Extremity Lymphedema   15 cm Proximal to Olecranon Process 31.3 cm    10 cm Proximal to Olecranon Process 28.8 cm    Olecranon Process 24.3 cm    15 cm Proximal to Ulnar Styloid Process 21 cm    10 cm Proximal to Ulnar Styloid Process 17.1 cm    Just Proximal to Ulnar Styloid Process 13.7 cm    Across Hand at [employer] 17.4 cm    At [place] of 2nd Digit 5.5 cm                              PT Education - 10/26/20 1145    Education Details Reviewed self MLD and made several small changes    Person(s) Educated Patient    Methods Explanation;Demonstration    Comprehension Returned demonstration               PT Long Term Goals - 10/26/20 1127      PT LONG TERM GOAL #1   Title Patient will demonstrate she has returned to baseline since surgery related to shoulder ROM and function.    Time 4    Period Weeks    Status Achieved      PT LONG TERM GOAL #2   Title Pt will be independent in left breast MLD    Time 3    Period Weeks    Status Achieved      PT LONG TERM GOAL #3   Title Pt will report decreased breast heaviness by 50% or greater    Time 4    Period Weeks    Status Achieved      PT LONG TERM GOAL #4   Title Pt will be independent with scar massage    Period Weeks    Status Achieved      PT LONG TERM GOAL #5   Title Pt will be fit for compression sleeve and will be independent in its use    Period Weeks    Status Achieved  PT LONG TERM GOAL #6   Title Pt  will maintain left shoulder ROM throughout radiation    Time 6    Period Weeks    Status Achieved                 Plan - 10/26/20 1201    Clinical Impression Statement Pt has achieved all goals established at initial evaluation.  Her shoulder ROM is WNL and she is independent in her HEP and performing MLD to the left breast.  She has a compression sleeve and understands when she should wear it.  She verbalized understanding of signs of infection and ways to prevent lymphedema.  She does have some increased left breast swelling since radiation that does vary, but which responds to MLD.  She also has a compression bra that she wears.  She feels ready to be released to independent self management.    Personal Factors and Comorbidities Comorbidity 2    Comorbidities Hx of bilateral breast CA with new onset of left Breast CA. ( right in 2019), lymphedema risk    Stability/Clinical Decision Making Stable/Uncomplicated    Rehab Potential Excellent    PT Frequency 1x / week    PT Duration 6 weeks    PT Treatment/Interventions ADLs/Self Care Home Management;Therapeutic exercise;Patient/family education;Manual techniques;Manual lymph drainage;Scar mobilization;Passive range of motion    PT Next Visit Plan Discharge to HEP, self MLD    PT Home Exercise Plan 4 post op exercises, ABC class, Supine scapular stabs x 10 with yellow Therabandm MLD to left breast    Consulted and Agree with Plan of Care Patient           Patient will benefit from skilled therapeutic intervention in order to improve the following deficits and impairments:  Postural dysfunction,Decreased knowledge of precautions,Decreased scar mobility,Increased edema,Decreased range of motion,Decreased strength  Visit Diagnosis: Malignant neoplasm of upper-outer quadrant of left female breast, unspecified estrogen receptor status ([facility])  Abnormal posture  Edema, unspecified type     Problem List Patient Active Problem List    Diagnosis Date Noted  . Age-related osteoporosis without current pathological fracture 09/08/2020  . Allergic rhinitis 09/08/2020  . Palpitations 09/08/2020  . Personal history of malignant neoplasm of breast 09/08/2020  . Personal history of transient ischemic attack (TIA), and cerebral infarction without residual deficits 09/08/2020  . Pure hypercholesterolemia 09/08/2020  . Vitamin D deficiency 09/08/2020  . Neuropathy due to chemotherapeutic drug ([place]) 07/23/2018  . Port-A-Cath in place 08/28/2017  . TIA (transient ischemic attack) 08/09/2017  . Hypertension   . Genetic testing 07/06/2017  . Family history of ovarian cancer   . Family history of breast cancer   . Malignant neoplasm of upper-outer quadrant of right breast in female, estrogen receptor negative ([place]) 06/26/2017  . Malignant neoplasm of left breast, estrogen receptor positive ([place]) 05/19/2017  PHYSICAL THERAPY DISCHARGE SUMMARY  Visits from Start of Care: 11  Current functional level related to goals / functional outcomes.  Achieved all goals.     Remaining deficits: Continues with left breast swelling which responds to MLD/compression bra   Education / Equipment: Compression sleeve/bra Plan: Patient agrees to discharge.  Patient goals were met. Patient is being discharged due to meeting the stated rehab goals.  ?????       Claris Pong 10/26/2020, 12:08 PM  [place], [place], [address] Phone: [phone]   Fax:  [phone]  Name: April [name] MRN: [id number]  Date of Birth: [birth year] Cheral Almas, PT 10/26/20 12:11 PM

## 2020-10-27 ENCOUNTER — Inpatient Hospital Stay: Payer: Medicare PPO | Attending: Oncology

## 2020-10-27 ENCOUNTER — Inpatient Hospital Stay: Payer: Medicare PPO

## 2020-10-27 VITALS — BP 135/87 | HR 70 | Temp 97.8°F | Resp 18

## 2020-10-27 DIAGNOSIS — Z8042 Family history of malignant neoplasm of prostate: Secondary | ICD-10-CM | POA: Diagnosis not present

## 2020-10-27 DIAGNOSIS — C50212 Malignant neoplasm of upper-inner quadrant of left female breast: Secondary | ICD-10-CM | POA: Diagnosis not present

## 2020-10-27 DIAGNOSIS — Z171 Estrogen receptor negative status [ER-]: Secondary | ICD-10-CM | POA: Diagnosis not present

## 2020-10-27 DIAGNOSIS — C50411 Malignant neoplasm of upper-outer quadrant of right female breast: Secondary | ICD-10-CM | POA: Diagnosis not present

## 2020-10-27 DIAGNOSIS — Z5112 Encounter for antineoplastic immunotherapy: Secondary | ICD-10-CM | POA: Diagnosis not present

## 2020-10-27 DIAGNOSIS — Z923 Personal history of irradiation: Secondary | ICD-10-CM | POA: Insufficient documentation

## 2020-10-27 DIAGNOSIS — Z8249 Family history of ischemic heart disease and other diseases of the circulatory system: Secondary | ICD-10-CM | POA: Insufficient documentation

## 2020-10-27 DIAGNOSIS — Z803 Family history of malignant neoplasm of breast: Secondary | ICD-10-CM | POA: Diagnosis not present

## 2020-10-27 DIAGNOSIS — Z5111 Encounter for antineoplastic chemotherapy: Secondary | ICD-10-CM | POA: Insufficient documentation

## 2020-10-27 DIAGNOSIS — Z79899 Other long term (current) drug therapy: Secondary | ICD-10-CM | POA: Diagnosis not present

## 2020-10-27 DIAGNOSIS — Z8041 Family history of malignant neoplasm of ovary: Secondary | ICD-10-CM | POA: Insufficient documentation

## 2020-10-27 DIAGNOSIS — Z5189 Encounter for other specified aftercare: Secondary | ICD-10-CM | POA: Diagnosis not present

## 2020-10-27 DIAGNOSIS — Z95828 Presence of other vascular implants and grafts: Secondary | ICD-10-CM

## 2020-10-27 LAB — CMP (CANCER CENTER ONLY)
ALT: 48 U/L — ABNORMAL HIGH (ref 0–44)
AST: 33 U/L (ref 15–41)
Albumin: 3.9 g/dL (ref 3.5–5.0)
Alkaline Phosphatase: 67 U/L (ref 38–126)
Anion gap: 9 (ref 5–15)
BUN: 19 mg/dL (ref 8–23)
CO2: 27 mmol/L (ref 22–32)
Calcium: 9.7 mg/dL (ref 8.9–10.3)
Chloride: 104 mmol/L (ref 98–111)
Creatinine: 1 mg/dL (ref 0.44–1.00)
GFR, Estimated: >60 mL/min (ref >60)
Glucose, Bld: 123 mg/dL — ABNORMAL HIGH (ref 70–99)
Potassium: 3.9 mmol/L (ref 3.5–5.1)
Sodium: 140 mmol/L (ref 135–145)
Total Bilirubin: 0.3 mg/dL (ref 0.3–1.2)
Total Protein: 7.2 g/dL (ref 6.5–8.1)

## 2020-10-27 LAB — CBC WITH DIFFERENTIAL (CANCER CENTER ONLY)
Abs Immature Granulocytes: 0.02 10*3/uL (ref 0.00–0.07)
Basophils Absolute: 0 10*3/uL (ref 0.0–0.1)
Basophils Relative: 0 %
Eosinophils Absolute: 0.1 10*3/uL (ref 0.0–0.5)
Eosinophils Relative: 4 %
HCT: 31.1 % — ABNORMAL LOW (ref 36.0–46.0)
Hemoglobin: 10.2 g/dL — ABNORMAL LOW (ref 12.0–15.0)
Immature Granulocytes: 1 %
Lymphocytes Relative: 33 %
Lymphs Abs: 0.8 10*3/uL (ref 0.7–4.0)
MCH: 30.5 pg (ref 26.0–34.0)
MCHC: 32.8 g/dL (ref 30.0–36.0)
MCV: 93.1 fL (ref 80.0–100.0)
Monocytes Absolute: 0.4 10*3/uL (ref 0.1–1.0)
Monocytes Relative: 15 %
Neutro Abs: 1.2 10*3/uL — ABNORMAL LOW (ref 1.7–7.7)
Neutrophils Relative %: 47 %
Platelet Count: 192 10*3/uL (ref 150–400)
RBC: 3.34 MIL/uL — ABNORMAL LOW (ref 3.87–5.11)
RDW: 15.3 % (ref 11.5–15.5)
WBC Count: 2.5 10*3/uL — ABNORMAL LOW (ref 4.0–10.5)
nRBC: 0 % (ref 0.0–0.2)

## 2020-10-27 MED ORDER — SODIUM CHLORIDE 0.9% FLUSH
10.0000 mL | INTRAVENOUS | Status: DC | PRN
  Administered 2020-10-27: 10 mL
  Filled 2020-10-27: qty 10

## 2020-10-27 MED ORDER — PALONOSETRON HCL INJECTION 0.25 MG/5ML
0.2500 mg | Freq: Once | INTRAVENOUS | Status: AC
  Administered 2020-10-27: 0.25 mg via INTRAVENOUS

## 2020-10-27 MED ORDER — METHOTREXATE SODIUM (PF) CHEMO INJECTION 250 MG/10ML
40.0000 mg/m2 | Freq: Once | INTRAMUSCULAR | Status: AC
Start: 2020-10-27 — End: 2020-10-27
  Administered 2020-10-27: 70 mg via INTRAVENOUS
  Filled 2020-10-27: qty 2.8

## 2020-10-27 MED ORDER — ACETAMINOPHEN 325 MG PO TABS
ORAL_TABLET | ORAL | Status: AC
  Filled 2020-10-27: qty 2

## 2020-10-27 MED ORDER — SODIUM CHLORIDE 0.9 % IV SOLN
Freq: Once | INTRAVENOUS | Status: AC
  Filled 2020-10-27: qty 250

## 2020-10-27 MED ORDER — SODIUM CHLORIDE 0.9% FLUSH
10.0000 mL | Freq: Once | INTRAVENOUS | Status: AC
  Administered 2020-10-27: 10 mL
  Filled 2020-10-27: qty 10

## 2020-10-27 MED ORDER — FLUOROURACIL CHEMO INJECTION 2.5 GM/50ML
600.0000 mg/m2 | Freq: Once | INTRAVENOUS | Status: AC
  Administered 2020-10-27: 1050 mg via INTRAVENOUS
  Filled 2020-10-27: qty 21

## 2020-10-27 MED ORDER — HEPARIN SOD (PORK) LOCK FLUSH 100 UNIT/ML IV SOLN
500.0000 [IU] | Freq: Once | INTRAVENOUS | Status: AC | PRN
  Administered 2020-10-27: 500 [IU]
  Filled 2020-10-27: qty 5

## 2020-10-27 MED ORDER — SODIUM CHLORIDE 0.9 % IV SOLN
10.0000 mg | Freq: Once | INTRAVENOUS | Status: AC
  Administered 2020-10-27: 10 mg via INTRAVENOUS
  Filled 2020-10-27: qty 10

## 2020-10-27 MED ORDER — ACETAMINOPHEN 325 MG PO TABS
650.0000 mg | ORAL_TABLET | Freq: Once | ORAL | Status: AC
  Administered 2020-10-27: 650 mg via ORAL

## 2020-10-27 MED ORDER — TRASTUZUMAB-ANNS CHEMO 150 MG IV SOLR
6.0000 mg/kg | Freq: Once | INTRAVENOUS | Status: AC
  Administered 2020-10-27: 420 mg via INTRAVENOUS
  Filled 2020-10-27: qty 20

## 2020-10-27 MED ORDER — SODIUM CHLORIDE 0.9 % IV SOLN
600.0000 mg/m2 | Freq: Once | INTRAVENOUS | Status: AC
  Administered 2020-10-27: 1060 mg via INTRAVENOUS
  Filled 2020-10-27: qty 53

## 2020-10-27 MED ORDER — DIPHENHYDRAMINE HCL 25 MG PO CAPS
ORAL_CAPSULE | ORAL | Status: AC
  Filled 2020-10-27: qty 1

## 2020-10-27 MED ORDER — DIPHENHYDRAMINE HCL 25 MG PO CAPS
25.0000 mg | ORAL_CAPSULE | Freq: Once | ORAL | Status: AC
Start: 2020-10-27 — End: 2020-10-27
  Administered 2020-10-27: 25 mg via ORAL

## 2020-10-27 MED ORDER — PALONOSETRON HCL INJECTION 0.25 MG/5ML
INTRAVENOUS | Status: AC
  Filled 2020-10-27: qty 5

## 2020-10-27 NOTE — Patient Instructions (Signed)
[facility] MEDICAL [facility]COLOG[facility]  Discharge Instructions: Thank you for choosing [facility] to provide your oncology and hematology care.   If you have a lab appointment with the Cancer Center, please go directly to the Cancer Center and check in at the registration area.   Wear comfortable clothing and clothing appropriate for easy access to any Portacath or PICC line.   We strive to give you quality time with your provider. You may need to reschedule your appointment if you arrive late (15 or more minutes).  Arriving late affects you and other patients whose appointments are after yours.  Also, if you miss three or more appointments without notifying the office, you may be dismissed from the clinic at the provider's discretion.      For prescription refill requests, have your pharmacy contact our office and allow 72 hours for refills to be completed.    Today you received the following chemotherapy and/or immunotherapy agents cytoxan, methotrexate, fluorourcil      To help prevent nausea and vomiting after your treatment, we encourage you to take your nausea medication as directed.  BELOW ARE SYMPTOMS THAT SHOULD BE REPORTED IMMEDIATELY: *FEVER GREATER THAN 100.4 F (38 C) OR HIGHER *CHILLS OR SWEATING *NAUSEA AND VOMITING THAT IS NOT CONTROLLED WITH YOUR NAUSEA MEDICATION *UNUSUAL SHORTNESS OF BREATH *UNUSUAL BRUISING OR BLEEDING *URINARY PROBLEMS (pain or burning when urinating, or frequent urination) *BOWEL PROBLEMS (unusual diarrhea, constipation, pain near the anus) TENDERNESS IN MOUTH AND THROAT WITH OR WITHOUT PRESENCE OF ULCERS (sore throat, sores in mouth, or a toothache) UNUSUAL RASH, SWELLING OR PAIN  UNUSUAL VAGINAL DISCHARGE OR ITCHING   Items with * indicate a potential emergency and should be followed up as soon as possible or go to the Emergency Department if any problems should occur.  Please show the CHEMOTHERAPY ALERT CARD or IMMUNOTHERAPY  ALERT CARD at check-in to the Emergency Department and triage nurse.  Should you have questions after your visit or need to cancel or reschedule your appointment, please contact [facility]  Dept: [phone]  and follow the prompts.  Office hours are 8:00 a.m. to 4:30 p.m. Monday - Friday. Please note that voicemails left after 4:00 p.m. may not be returned until the following business day.  We are closed weekends and major holidays. You have access to a nurse at all times for urgent questions. Please call the main number to the clinic Dept: [phone] and follow the prompts.   For any non-urgent questions, you may also contact your provider using MyChart. We now offer e-Visits for anyone 18 and older to request care online for non-urgent symptoms. For details visit [email or web address].   Also download the MyChart app! Go to the app store, search "MyChart", open the app, select [facility], and log in with your MyChart username and password.  Due to Covid, a mask is required upon entering the hospital/clinic. If you do not have a mask, one will be given to you upon arrival. For doctor visits, patients may have 1 support person aged 18 or older with them. For treatment visits, patients cannot have anyone with them due to current Covid guidelines and our immunocompromised population.   

## 2020-10-27 NOTE — Progress Notes (Signed)
Per Dr. Jana Hakim ok to treat with ANC 1.2

## 2020-10-27 NOTE — Patient Instructions (Signed)

## 2020-10-29 ENCOUNTER — Other Ambulatory Visit: Payer: Self-pay

## 2020-10-29 ENCOUNTER — Inpatient Hospital Stay: Payer: Medicare PPO

## 2020-10-29 DIAGNOSIS — C50411 Malignant neoplasm of upper-outer quadrant of right female breast: Secondary | ICD-10-CM

## 2020-10-29 DIAGNOSIS — Z5112 Encounter for antineoplastic immunotherapy: Secondary | ICD-10-CM | POA: Diagnosis not present

## 2020-10-29 DIAGNOSIS — Z923 Personal history of irradiation: Secondary | ICD-10-CM | POA: Diagnosis not present

## 2020-10-29 DIAGNOSIS — Z803 Family history of malignant neoplasm of breast: Secondary | ICD-10-CM | POA: Diagnosis not present

## 2020-10-29 DIAGNOSIS — Z5111 Encounter for antineoplastic chemotherapy: Secondary | ICD-10-CM | POA: Diagnosis not present

## 2020-10-29 DIAGNOSIS — C50212 Malignant neoplasm of upper-inner quadrant of left female breast: Secondary | ICD-10-CM | POA: Diagnosis not present

## 2020-10-29 DIAGNOSIS — Z171 Estrogen receptor negative status [ER-]: Secondary | ICD-10-CM | POA: Diagnosis not present

## 2020-10-29 DIAGNOSIS — Z5189 Encounter for other specified aftercare: Secondary | ICD-10-CM | POA: Diagnosis not present

## 2020-10-29 DIAGNOSIS — Z79899 Other long term (current) drug therapy: Secondary | ICD-10-CM | POA: Diagnosis not present

## 2020-10-29 MED ORDER — PEGFILGRASTIM-JMDB 6 MG/0.6ML ~~LOC~~ SOSY
PREFILLED_SYRINGE | SUBCUTANEOUS | Status: AC
  Filled 2020-10-29: qty 0.6

## 2020-10-29 MED ORDER — PEGFILGRASTIM-JMDB 6 MG/0.6ML ~~LOC~~ SOSY
6.0000 mg | PREFILLED_SYRINGE | Freq: Once | SUBCUTANEOUS | Status: AC
  Administered 2020-10-29: 6 mg via SUBCUTANEOUS

## 2020-10-29 NOTE — Patient Instructions (Signed)
Pegfilgrastim injection What is this medicine? PEGFILGRASTIM (PEG fil gra stim) is a long-acting granulocyte colony-stimulating factor that stimulates the growth of neutrophils, a type of white blood cell important in the body's fight against infection. It is used to reduce the incidence of fever and infection in patients with certain types of cancer who are receiving chemotherapy that affects the bone marrow, and to increase survival after being exposed to high doses of radiation. This medicine may be used for other purposes; ask your health care provider or pharmacist if you have questions. COMMON BRAND NAME(S): Fulphila, Neulasta, Nyvepria, UDENYCA, Ziextenzo What should I tell my health care provider before I take this medicine? They need to know if you have any of these conditions:  kidney disease  latex allergy  ongoing radiation therapy  sickle cell disease  skin reactions to acrylic adhesives (On-Body Injector only)  an unusual or allergic reaction to pegfilgrastim, filgrastim, other medicines, foods, dyes, or preservatives  pregnant or trying to get pregnant  breast-feeding How should I use this medicine? This medicine is for injection under the skin. If you get this medicine at home, you will be taught how to prepare and give the pre-filled syringe or how to use the On-body Injector. Refer to the patient Instructions for Use for detailed instructions. Use exactly as directed. Tell your healthcare provider immediately if you suspect that the On-body Injector may not have performed as intended or if you suspect the use of the On-body Injector resulted in a missed or partial dose. It is important that you put your used needles and syringes in a special sharps container. Do not put them in a trash can. If you do not have a sharps container, call your pharmacist or healthcare provider to get one. Talk to your pediatrician regarding the use of this medicine in children. While this drug  may be prescribed for selected conditions, precautions do apply. Overdosage: If you think you have taken too much of this medicine contact a poison control center or emergency room at once. NOTE: This medicine is only for you. Do not share this medicine with others. What if I miss a dose? It is important not to miss your dose. Call your doctor or health care professional if you miss your dose. If you miss a dose due to an On-body Injector failure or leakage, a new dose should be administered as soon as possible using a single prefilled syringe for manual use. What may interact with this medicine? Interactions have not been studied. This list may not describe all possible interactions. Give your health care provider a list of all the medicines, herbs, non-prescription drugs, or dietary supplements you use. Also tell them if you smoke, drink alcohol, or use illegal drugs. Some items may interact with your medicine. What should I watch for while using this medicine? Your condition will be monitored carefully while you are receiving this medicine. You may need blood work done while you are taking this medicine. Talk to your health care provider about your risk of cancer. You may be more at risk for certain types of cancer if you take this medicine. If you are going to need a MRI, CT scan, or other procedure, tell your doctor that you are using this medicine (On-Body Injector only). What side effects may I notice from receiving this medicine? Side effects that you should report to your doctor or health care professional as soon as possible:  allergic reactions (skin rash, itching or hives, swelling of   the face, lips, or tongue)  back pain  dizziness  fever  pain, redness, or irritation at site where injected  pinpoint red spots on the skin  red or dark-brown urine  shortness of breath or breathing problems  stomach or side pain, or pain at the shoulder  swelling  tiredness  trouble  passing urine or change in the amount of urine  unusual bruising or bleeding Side effects that usually do not require medical attention (report to your doctor or health care professional if they continue or are bothersome):  bone pain  muscle pain This list may not describe all possible side effects. Call your doctor for medical advice about side effects. You may report side effects to FDA at 1-800-FDA-1088. Where should I keep my medicine? Keep out of the reach of children. If you are using this medicine at home, you will be instructed on how to store it. Throw away any unused medicine after the expiration date on the label. NOTE: This sheet is a summary. It may not cover all possible information. If you have questions about this medicine, talk to your doctor, pharmacist, or health care provider.  2021 Elsevier/Gold Standard (2019-06-27 13:20:51)  

## 2020-11-08 ENCOUNTER — Encounter: Payer: Self-pay | Admitting: *Deleted

## 2020-11-09 ENCOUNTER — Other Ambulatory Visit: Payer: Medicare PPO

## 2020-11-09 ENCOUNTER — Ambulatory Visit: Payer: Medicare PPO

## 2020-11-09 ENCOUNTER — Ambulatory Visit: Payer: Medicare PPO | Admitting: Oncology

## 2020-11-11 ENCOUNTER — Other Ambulatory Visit: Payer: Self-pay

## 2020-11-11 ENCOUNTER — Other Ambulatory Visit: Payer: Self-pay | Admitting: Oncology

## 2020-11-11 DIAGNOSIS — Z171 Estrogen receptor negative status [ER-]: Secondary | ICD-10-CM

## 2020-11-11 MED ORDER — POLYETHYLENE GLYCOL 3350 17 GM/SCOOP PO POWD: 17.0000 g | Freq: Every day | ORAL | 0 refills | Status: DC

## 2020-11-11 MED ORDER — MAGNESIUM CITRATE PO SOLN: ORAL | 0 refills | Status: DC

## 2020-11-11 MED ORDER — SENNA 8.6 MG PO TABS: 2.0000 | ORAL_TABLET | Freq: Two times a day (BID) | ORAL | 0 refills | Status: DC

## 2020-11-11 NOTE — Progress Notes (Unsigned)
April [name] called c/o severe constipation and nausea. She had stopped taking compazine hopig vomiting would help. She is taking narcotics likely causing the constipation. States fleet's enema not effective and has hemorrhoids. A CT abd 5/23 showed no SBO  Prescribed daily miralax, daily senokot (2 BID) and mag citrate tonight.

## 2020-11-15 NOTE — Progress Notes (Signed)
[place]  Telephone:(336) [phone] Fax:(336) [phone]     ID: NYASIA BAXLEY DOB: 04-May-1951  MR#: [id number]  ZSM#:[id number]  Patient Care Team: Kelton Pillar, MD as PCP - General (Family Medicine) Kegan Shepardson, Virgie Dad, MD as Consulting Physician (Oncology) Rolm Bookbinder, MD as Consulting Physician (General Surgery) Eppie Gibson, MD as Attending Physician (Radiation Oncology) Newt Minion, MD as Consulting Physician (Orthopedic Surgery) Regal, Tamala Fothergill, DPM as Consulting Physician (Podiatry) Neldon Mc, Donnamarie Poag, MD as Consulting Physician (Allergy and Immunology) Mauro Kaufmann, RN as Oncology Nurse Navigator Rockwell Germany, RN as Oncology Nurse Navigator Bensimhon, Shaune Pascal, MD as Consulting Physician (Cardiology) OTHER MD:  CHIEF COMPLAINT: Estrogen receptor negative breast cancer  CURRENT TREATMENT: Adjuvant chemo immunotherapy   INTERVAL HISTORY: April [name] returns today for follow up and treatment of her new left breast cancer.   She began adjuvant CMF therapy on 07/27/2020.  Today is day 1 cycle 6.  She tolerates this well, with no unusual side effects other than mild constipation.  She now has that under good control using stool softeners twice a day and MiraLAX daily.  She also receives trastuzumab every 3 weeks.  Her most recent echocardiogram on 08/13/2020 showed an ejection fraction of 65-70%.   REVIEW OF SYSTEMS: April [name] continues to tolerate chemotherapy generally quite well.  She enjoyed the recent holidays and showed me some photos of her very accomplished 71 year old granddaughter Zuri, who is doing very well in school.  A detailed review of systems today was otherwise stable.   COVID 19 VACCINATION STATUS: fully vaccinated [employer]), with booster 01/2020   LEFT BREAST CANCER HISTORY: From the original intake note:  She underwent bilateral diagnostic mammography with tomography at [facility] on 06/15/2020 showing: breast density category C;  this showed in the right breast evidence of postsurgical and radiation changes but this was all stable.  On the left however there was a new group of pleomorphic calcifications in the upper inner quadrant..  She proceeded to biopsy of the left breast area in question on 06/15/2020. Pathology from the procedure ([id number]) showed: invasive ductal carcinoma, grade 3; high-grade ductal carcinoma in situ with necrosis; lymphovascular invasion identified. Prognostic indicators significant for: estrogen receptor 40% positive with weak staining intensity; progesterone receptor 0% negative. Proliferation marker Ki67 of 45%. Her2 positive by immunohistochemistry (3+).   RIGHT BREAST CANCER HISTORY: From the original intake note:  "April [name]" had bilateral screening mammography at [facility] 06/06/2017.  This showed a possible mass in the right breast at the 11 o'clock position.  On 06/13/2017 she underwent right diagnostic mammography and ultrasonography.  Breast density was category C.  In the right breast at the 11 o'clock position there was a 1 cm area by mammography.  By ultrasound this confirmed a 1.0 cm irregular mass with lobulated margins in the upper outer quadrant of the right breast.  There was a second, 0.4 cm lobulated mass in the same quadrant.  The right axilla was sonographically benign.  On 06/20/2017 biopsy of the 2 right breast masses in question was performed.  The final pathology ([id number]) found the smaller mass to be only fibrocystic change.  This is felt to be concordant.  The larger mass however was an invasive ductal carcinoma, grade 3, estrogen and progesterone receptor negative, with an MIB-1 of 30%, and HER-2 amplified, with a signals ratio of 2.24.  The number per cell was 4.60.  The patient's subsequent history is as detailed below.   PAST MEDICAL HISTORY: Past Medical  History:  Diagnosis Date  . Breast cancer ([place]) 05/2017   right breast  . Eczema   . Family history of breast  cancer   . Family history of ovarian cancer   . History of radiation therapy 10/17/17- 11/14/17   40.05 directed to the right breast in 15 fractions, followed by a boost of 10 gy given in 5 fractions.   . Hypertension    toxemia during pregnancy, no meds now    PAST SURGICAL HISTORY: Past Surgical History:  Procedure Laterality Date  . ABDOMINAL HYSTERECTOMY    . BREAST LUMPECTOMY WITH RADIOACTIVE SEED AND SENTINEL LYMPH NODE BIOPSY Right 07/17/2017   Procedure: BREAST LUMPECTOMY WITH RADIOACTIVE SEED AND SENTINEL LYMPH NODE BIOPSY;  Surgeon: Rolm Bookbinder, MD;  Location: [facility];  Service: General;  Laterality: Right;  . BREAST LUMPECTOMY WITH RADIOACTIVE SEED AND SENTINEL LYMPH NODE BIOPSY Left 07/13/2020   Procedure: LEFT BREAST LUMPECTOMY WITH RADIOACTIVE SEED AND LEFT AXILLARY SENTINEL LYMPH NODE BIOPSY;  Surgeon: Rolm Bookbinder, MD;  Location: [facility];  Service: General;  Laterality: Left;  . CESAREAN SECTION     x4  . DILATION AND CURETTAGE OF UTERUS    . KNEE SURGERY Right   . PORT-A-CATH REMOVAL Right 09/04/2018   Procedure: REMOVAL PORT-A-CATH;  Surgeon: Rolm Bookbinder, MD;  Location: [facility];  Service: General;  Laterality: Right;  . PORTACATH PLACEMENT N/A 07/17/2017   Procedure: INSERTION PORT-A-CATH WITH [place];  Surgeon: Rolm Bookbinder, MD;  Location: [facility];  Service: General;  Laterality: N/A;  . PORTACATH PLACEMENT Right 07/13/2020   Procedure: INSERTION PORT-A-CATH WITH ULTRASOUND GUIDANCE;  Surgeon: Rolm Bookbinder, MD;  Location: [facility];  Service: General;  Laterality: Right;    FAMILY HISTORY Family History  Problem Relation Age of Onset  . Breast cancer Mother 92       again at 62 in other breast   . Heart attack Father 21  . Breast cancer Sister 8  . Breast cancer Maternal Grandmother 63       spread to lungs, died at 72  . Ovarian cancer Cousin 67  .  Prostate cancer Cousin   The patient's father died at age 19 from a heart attack.  The patient's mother is currently living at age 68 (as of January 2019).  The patient had 1 sister who was diagnosed with breast cancer at age 51 and died from metastatic disease at age 50.  The patient has 1 brother.  In addition the patient's mother was diagnosed with breast cancer at age 67, on the left side, and now has a right-sided breast cancer diagnosed in January 2019.  There is in addition a cousin with ovarian cancer diagnosed when she was 71 years old   GYNECOLOGIC HISTORY:  No LMP recorded. Patient has had a hysterectomy. Menarche age 49, first live birth age 58, the patient had 3 live births, 1 of whom survived only 2 days.  She underwent hysterectomy without salpingo-oophorectomy September 05, 1978.  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  Her husband [name] his Theme park manager at [place].  The patient's daughter [name] lives in [place] where she is an Tourist information centre manager.  The patient's daughter [name] lives in [place] working for the department of defense.  The patient  has 3 grandchildren, 1 of whom is in the seventh grade but is actually the captain of the eighth grade basketball team and is playing in a championship February 2020.   ADVANCED DIRECTIVES: Not in place   HEALTH MAINTENANCE: Social History   Tobacco Use  . Smoking status: Never Smoker  . Smokeless tobacco: Never Used  Vaping Use  . Vaping Use: Never used  Substance Use Topics  . Alcohol use: Yes    Comment: social  . Drug use: No     Colonoscopy: April 2018/[facility]  PAP: Status post hysterectomy  Bone density:   Allergies  Allergen Reactions  . Tape Itching and Rash  . Betamethasone Dipropionate Other (See Comments)  . Cetirizine Hcl  Other (See Comments)  . Lisinopril Other (See Comments)  . Loratadine Other (See Comments)  . Nsaids     Stomach issue  . Other Other (See Comments)  . Statins Other (See Comments)  . Sulfa Antibiotics Rash    Current Outpatient Medications  Medication Sig Dispense Refill  . aspirin 81 MG chewable tablet Chew 81 mg by mouth daily.    . Calcium-Vitamin D-Vitamin K (CALCIUM + D) [phone] MG-UNT-MCG CHEW     . co-enzyme Q-10 30 MG capsule Take 30 mg by mouth daily.    . hydrochlorothiazide (MICROZIDE) 12.5 MG capsule TAKE 1 CAPSULE BY MOUTH EVERY DAY 90 capsule 3  . lidocaine-prilocaine (EMLA) cream Apply 1 application topically as needed. 30 g 0  . lidocaine-prilocaine (EMLA) cream Apply to affected area once 30 g 3  . magnesium citrate SOLN Take 1/2 bottle now; if no results after 2 hours take rest of the bottle 296 mL 0  . Omega-3 Fatty Acids (FISH OIL) 1000 MG CAPS Take 1 capsule by mouth daily.    . polyethylene glycol powder (MIRALAX) 17 GM/SCOOP powder Take 17 g by mouth daily. 255 g 0  . prochlorperazine (COMPAZINE) 10 MG tablet Take 1 tablet (10 mg total) by mouth every 6 (six) hours as needed (Nausea or vomiting). 30 tablet 1  . Red Yeast Rice Extract (RED YEAST RICE PO) Take by mouth.    . senna (SENOKOT) 8.6 MG TABS tablet Take 2 tablets (17.2 mg total) by mouth in the morning and at bedtime. 120 tablet 0  . traMADol (ULTRAM) 50 MG tablet Take 1 tablet (50 mg total) by mouth every 6 (six) hours as needed. 10 tablet 0  . vitamin B-12 (CYANOCOBALAMIN) 1000 MCG tablet Take 1,000 mcg by mouth daily.    . vitamin C (ASCORBIC ACID) 250 MG tablet Take 250 mg by mouth daily.     No current facility-administered medications for this visit.   Facility-Administered Medications Ordered in Other Visits  Medication Dose Route Frequency Provider Last Rate Last Admin  . sodium chloride flush (NS) 0.9 % injection 10 mL  10 mL Intracatheter PRN Aissa Lisowski, Virgie Dad, MD   10 mL at 11/16/20  1610    OBJECTIVE: African-American woman in no acute distress Vitals:   11/16/20 1245  BP: (!) 174/79  Pulse: 65  Resp: 18  Temp: 97.6 F (36.4 C)  SpO2: 100%     Body mass index is 26.52 kg/m.    Wt Readings from Last 3 Encounters:  11/16/20 152 lb 1.6 oz (69 kg)  10/05/20 152 lb 12.8 oz (69.3 kg)  09/28/20 152 lb 1.6 oz (69 kg)  ECOG FS:1 - Symptomatic but completely ambulatory   Sclerae unicteric, EOMs intact Wearing a mask No cervical  or supraclavicular adenopathy Lungs no rales or rhonchi Heart regular rate and rhythm Abd soft, nontender, positive bowel sounds MSK no focal spinal tenderness, no upper extremity lymphedema Neuro: nonfocal, well oriented, appropriate affect Breasts: Deferred    LAB RESULTS:  CMP     Component Value Date/Time   NA 141 11/16/2020 1227   K 3.7 11/16/2020 1227   CL 104 11/16/2020 1227   CO2 28 11/16/2020 1227   GLUCOSE 102 (H) 11/16/2020 1227   BUN 18 11/16/2020 1227   CREATININE 0.98 11/16/2020 1227   CALCIUM 10.0 11/16/2020 1227   PROT 7.1 11/16/2020 1227   ALBUMIN 3.8 11/16/2020 1227   AST 45 (H) 11/16/2020 1227   ALT 60 (H) 11/16/2020 1227   ALKPHOS 66 11/16/2020 1227   BILITOT 0.3 11/16/2020 1227   GFRNONAA >60 11/16/2020 1227   GFRAA >60 07/10/2019 1008   GFRAA >60 06/27/2017 0843    No results found for: TOTALPROTELP, ALBUMINELP, A1GS, A2GS, BETS, BETA2SER, GAMS, MSPIKE, SPEI  No results found for: KPAFRELGTCHN, LAMBDASER, KAPLAMBRATIO  Lab Results  Component Value Date   WBC 2.6 (L) 11/16/2020   NEUTROABS 1.1 (L) 11/16/2020   HGB 9.8 (L) 11/16/2020   HCT 30.1 (L) 11/16/2020   MCV 94.4 11/16/2020   PLT 214 11/16/2020   No results found for: LABCA2  No components found for: PYPPJK932  No results for input(s): INR in the last 168 hours.  No results found for: LABCA2  No results found for: IZT245  No results found for: YKD983  No results found for: JAS505  No results found for: CA2729  No  components found for: HGQUANT  No results found for: CEA1 / No results found for: CEA1   No results found for: AFPTUMOR  No results found for: CHROMOGRNA  No results found for: HGBA, HGBA2QUANT, HGBFQUANT, HGBSQUAN (Hemoglobinopathy evaluation)   No results found for: LDH  Lab Results  Component Value Date   IRON 77 05/21/2018   TIBC 404 05/21/2018   IRONPCTSAT 19 (L) 05/21/2018   (Iron and TIBC)  Lab Results  Component Value Date   FERRITIN 24 05/21/2018    Urinalysis    Component Value Date/Time   COLORURINE COLORLESS (A) 08/09/2017 2224   APPEARANCEUR CLEAR 08/09/2017 2224   LABSPEC 1.004 (L) 08/09/2017 2224   PHURINE 6.0 08/09/2017 [place] 08/09/2017 2224   HGBUR NEGATIVE 08/09/2017 2224   BILIRUBINUR NEGATIVE 08/09/2017 2224   KETONESUR NEGATIVE 08/09/2017 2224   PROTEINUR NEGATIVE 08/09/2017 2224   NITRITE NEGATIVE 08/09/2017 2224   LEUKOCYTESUR NEGATIVE 08/09/2017 2224    STUDIES: No results found.   ELIGIBLE FOR AVAILABLE RESEARCH PROTOCOL: no   ASSESSMENT: 70 y.o. [place] woman   RIGHT BREAST CANCER (0) status post right breast upper outer quadrant biopsy 06/20/2017 for a clinical T1b N0, stage IA invasive ductal carcinoma, grade 3, estrogen and progesterone receptor negative, but HER-2 amplified, with an MIB-1 of 30%  (1) genetics testing 07/05/2017 through the Common Hereditary Cancer Panel offered by [facility] found no deleterious mutations in APC, ATM, AXIN2, BARD1, BMPR1A, BRCA1, BRCA2, BRIP1, CDH1, CDKN2A (p14ARF), CDKN2A (p16INK4a), CKD4, CHEK2, CTNNA1, DICER1, EPCAM (Deletion/duplication testing only), GREM1 (promoter region deletion/duplication testing only), KIT, MEN1, MLH1, MSH2, MSH3, MSH6, MUTYH, NBN, NF1, NHTL1, PALB2, PDGFRA, PMS2, POLD1, POLE, PTEN, RAD50, RAD51C, RAD51D, SDHB, SDHC, SDHD, SMAD4, SMARCA4. STK11, TP53, TSC1, TSC2, and VHL.  The following genes were evaluated for sequence changes only: SDHA  and HOXB13 c.251G>A variant only.  (  a) a Variant of uncertain significance in MSH2 was identified c.1331G>T (p.Arg444Leu).   (2) status post right lumpectomy and sentinel lymph node sampling 07/17/2017 for a pT1c pN0, stage IA invasive ductal carcinoma, grade 2, with negative margins.  A total of 5 lymph nodes were removed  (3) adjuvant chemotherapy consisting of paclitaxel weekly x12 together with trastuzumab every 21 days starting 08/07/2017  (a) paclitaxel stopped after 8 doses because of neuropathy (last dose 09/25/2017  (4) trastuzumab continued to total 1 year (last dose 07/23/2018)  (a) echo 08/10/2017 showed an ejection fraction in the 65-70% range  (b) echo on 11/01/2017 shows EF of 60-65%  (c) cardiogram 02/05/2018 shows an ejection fraction in the 60-65% range.  (d) echocardiogram 05/28/2018 shows an ejection fraction in the 65-70%.  (5) adjuvant radiation 10/17/2017-11/14/2016: 40.05 Gy directed to the Right Breast in 15 fractions, followed by a boost of 10 Gy given in 5 fractions   (6) anemia, with normal MCV, ferritin, B12, folate, and inappropriately normal reticulocyte count, consistent with anemia of chronic illness.  LEFT BREAST CANCER: (7) status post left breast upper inner quadrant biopsy 06/15/2020 for a clinical T1 N0 invasive ductal carcinoma, grade 3, weakly estrogen receptor positive, progesterone receptor negative, HER2 amplified, with an MIB-1 of 45%.  (8) status post left lumpectomy and sentinel lymph node sampling 07/13/2020 for a pT1c pN0, stage IB invasive ductal carcinoma, grade 3, with negative margins  (a) a total of 5 axilla lymph nodes were removed, all benign  (b) repeat prognostic panel again weakly estrogen receptor positive, progesterone receptor negative, now HER-2 not amplified  (9) adjuvant chemotherapy and anti-HER2 immunotherapy to start 07/27/2020, consisting of CMF chemotherapy to be repeated every 21 days and trastuzumab to be continued for 1  year.  (a) echocardiogram 05/28/2020 shows an ejection fraction in the 65-70% range  (b) echo 08/13/2020 shows an ejection fraction in the 65 to 70% range  (c) cycle 4 of CMF delayed because of neutropenia; full [place] added with subsequent cycles  (d) methotrexate omitted during radiation over 1 overlap  (10) trastuzumab to be continued a minimum of 1 year  (11) adjuvant radiation given concurrently with CMF to be completed 10/19/2020  (12) to start letrozole at the completion of local treatment   PLAN: April [name] continues to be borderline neutropenic but we are proceeding with her sixth cycle today.  She will receive pegfilgrastim 2 days from now and I will allow [place] to continue treatment without delays.  She is due for repeat echocardiography and I have entered that order.  She will see me again in 3 weeks with cycle #7.  She knows to call for any other issue that may develop before then.  Total encounter time 20 minutes.*    Jatara Huettner, Virgie Dad, MD  11/16/20 6:00 PM Medical Oncology and Hematology [facility] [place], [place] [address] Tel. [phone]    Fax. [phone]   I, April [name], am acting as scribe for Dr. Virgie Dad. April [name].  I, April Del MD, have reviewed the above documentation for accuracy and completeness, and I agree with the above.   *Total Encounter Time as defined by the Centers for Medicare and Medicaid Services includes, in addition to the face-to-face time of a patient visit (documented in the note above) non-face-to-face time: obtaining and reviewing outside history, ordering and reviewing medications, tests or procedures, care coordination (communications with other health care professionals or caregivers) and documentation in the medical record.

## 2020-11-16 ENCOUNTER — Ambulatory Visit: Payer: Medicare PPO | Admitting: Oncology

## 2020-11-16 ENCOUNTER — Inpatient Hospital Stay: Payer: Medicare PPO

## 2020-11-16 ENCOUNTER — Encounter: Payer: Self-pay | Admitting: *Deleted

## 2020-11-16 ENCOUNTER — Inpatient Hospital Stay (HOSPITAL_BASED_OUTPATIENT_CLINIC_OR_DEPARTMENT_OTHER): Payer: Medicare PPO | Admitting: Oncology

## 2020-11-16 ENCOUNTER — Other Ambulatory Visit: Payer: Self-pay

## 2020-11-16 VITALS — BP 174/79 | HR 65 | Temp 97.6°F | Resp 18 | Ht 63.5 in | Wt 152.1 lb

## 2020-11-16 DIAGNOSIS — Z79899 Other long term (current) drug therapy: Secondary | ICD-10-CM | POA: Diagnosis not present

## 2020-11-16 DIAGNOSIS — C50212 Malignant neoplasm of upper-inner quadrant of left female breast: Secondary | ICD-10-CM | POA: Diagnosis not present

## 2020-11-16 DIAGNOSIS — C50411 Malignant neoplasm of upper-outer quadrant of right female breast: Secondary | ICD-10-CM

## 2020-11-16 DIAGNOSIS — Z803 Family history of malignant neoplasm of breast: Secondary | ICD-10-CM | POA: Diagnosis not present

## 2020-11-16 DIAGNOSIS — Z171 Estrogen receptor negative status [ER-]: Secondary | ICD-10-CM | POA: Diagnosis not present

## 2020-11-16 DIAGNOSIS — Z5189 Encounter for other specified aftercare: Secondary | ICD-10-CM | POA: Diagnosis not present

## 2020-11-16 DIAGNOSIS — Z95828 Presence of other vascular implants and grafts: Secondary | ICD-10-CM

## 2020-11-16 DIAGNOSIS — Z923 Personal history of irradiation: Secondary | ICD-10-CM | POA: Diagnosis not present

## 2020-11-16 DIAGNOSIS — Z5111 Encounter for antineoplastic chemotherapy: Secondary | ICD-10-CM | POA: Diagnosis not present

## 2020-11-16 DIAGNOSIS — Z5112 Encounter for antineoplastic immunotherapy: Secondary | ICD-10-CM | POA: Diagnosis not present

## 2020-11-16 LAB — CMP (CANCER CENTER ONLY)
ALT: 60 U/L — ABNORMAL HIGH (ref 0–44)
AST: 45 U/L — ABNORMAL HIGH (ref 15–41)
Albumin: 3.8 g/dL (ref 3.5–5.0)
Alkaline Phosphatase: 66 U/L (ref 38–126)
Anion gap: 9 (ref 5–15)
BUN: 18 mg/dL (ref 8–23)
CO2: 28 mmol/L (ref 22–32)
Calcium: 10 mg/dL (ref 8.9–10.3)
Chloride: 104 mmol/L (ref 98–111)
Creatinine: 0.98 mg/dL (ref 0.44–1.00)
GFR, Estimated: >60 mL/min (ref >60)
Glucose, Bld: 102 mg/dL — ABNORMAL HIGH (ref 70–99)
Potassium: 3.7 mmol/L (ref 3.5–5.1)
Sodium: 141 mmol/L (ref 135–145)
Total Bilirubin: 0.3 mg/dL (ref 0.3–1.2)
Total Protein: 7.1 g/dL (ref 6.5–8.1)

## 2020-11-16 LAB — CBC WITH DIFFERENTIAL (CANCER CENTER ONLY)
Abs Immature Granulocytes: 0.01 10*3/uL (ref 0.00–0.07)
Basophils Absolute: 0 10*3/uL (ref 0.0–0.1)
Basophils Relative: 1 %
Eosinophils Absolute: 0.1 10*3/uL (ref 0.0–0.5)
Eosinophils Relative: 5 %
HCT: 30.1 % — ABNORMAL LOW (ref 36.0–46.0)
Hemoglobin: 9.8 g/dL — ABNORMAL LOW (ref 12.0–15.0)
Immature Granulocytes: 0 %
Lymphocytes Relative: 28 %
Lymphs Abs: 0.7 10*3/uL (ref 0.7–4.0)
MCH: 30.7 pg (ref 26.0–34.0)
MCHC: 32.6 g/dL (ref 30.0–36.0)
MCV: 94.4 fL (ref 80.0–100.0)
Monocytes Absolute: 0.6 10*3/uL (ref 0.1–1.0)
Monocytes Relative: 24 %
Neutro Abs: 1.1 10*3/uL — ABNORMAL LOW (ref 1.7–7.7)
Neutrophils Relative %: 42 %
Platelet Count: 214 10*3/uL (ref 150–400)
RBC: 3.19 MIL/uL — ABNORMAL LOW (ref 3.87–5.11)
RDW: 15.7 % — ABNORMAL HIGH (ref 11.5–15.5)
WBC Count: 2.6 10*3/uL — ABNORMAL LOW (ref 4.0–10.5)
nRBC: 0 % (ref 0.0–0.2)

## 2020-11-16 MED ORDER — PALONOSETRON HCL INJECTION 0.25 MG/5ML
0.2500 mg | Freq: Once | INTRAVENOUS | Status: AC
  Administered 2020-11-16: 0.25 mg via INTRAVENOUS

## 2020-11-16 MED ORDER — ACETAMINOPHEN 325 MG PO TABS
ORAL_TABLET | ORAL | Status: AC
  Filled 2020-11-16: qty 2

## 2020-11-16 MED ORDER — HEPARIN SOD (PORK) LOCK FLUSH 100 UNIT/ML IV SOLN
500.0000 [IU] | Freq: Once | INTRAVENOUS | Status: AC | PRN
  Administered 2020-11-16: 500 [IU]
  Filled 2020-11-16: qty 5

## 2020-11-16 MED ORDER — TRASTUZUMAB-ANNS CHEMO 150 MG IV SOLR
6.0000 mg/kg | Freq: Once | INTRAVENOUS | Status: AC
  Administered 2020-11-16: 420 mg via INTRAVENOUS
  Filled 2020-11-16: qty 20

## 2020-11-16 MED ORDER — SODIUM CHLORIDE 0.9 % IV SOLN
Freq: Once | INTRAVENOUS | Status: AC
  Filled 2020-11-16: qty 250

## 2020-11-16 MED ORDER — PALONOSETRON HCL INJECTION 0.25 MG/5ML
INTRAVENOUS | Status: AC
  Filled 2020-11-16: qty 5

## 2020-11-16 MED ORDER — DIPHENHYDRAMINE HCL 25 MG PO CAPS
25.0000 mg | ORAL_CAPSULE | Freq: Once | ORAL | Status: AC
  Administered 2020-11-16: 25 mg via ORAL

## 2020-11-16 MED ORDER — SODIUM CHLORIDE 0.9% FLUSH
10.0000 mL | INTRAVENOUS | Status: DC | PRN
  Administered 2020-11-16: 10 mL
  Filled 2020-11-16: qty 10

## 2020-11-16 MED ORDER — SODIUM CHLORIDE 0.9 % IV SOLN
600.0000 mg/m2 | Freq: Once | INTRAVENOUS | Status: AC
  Administered 2020-11-16: 1060 mg via INTRAVENOUS
  Filled 2020-11-16: qty 53

## 2020-11-16 MED ORDER — SODIUM CHLORIDE 0.9% FLUSH
10.0000 mL | Freq: Once | INTRAVENOUS | Status: AC
Start: 2020-11-16 — End: 2020-11-16
  Administered 2020-11-16: 10 mL
  Filled 2020-11-16: qty 10

## 2020-11-16 MED ORDER — DIPHENHYDRAMINE HCL 25 MG PO CAPS
ORAL_CAPSULE | ORAL | Status: AC
  Filled 2020-11-16: qty 1

## 2020-11-16 MED ORDER — ACETAMINOPHEN 325 MG PO TABS
650.0000 mg | ORAL_TABLET | Freq: Once | ORAL | Status: AC
Start: 2020-11-16 — End: 2020-11-16
  Administered 2020-11-16: 650 mg via ORAL

## 2020-11-16 MED ORDER — SODIUM CHLORIDE 0.9 % IV SOLN
Freq: Once | INTRAVENOUS | Status: AC
Start: 2020-11-16 — End: 2020-11-16
  Filled 2020-11-16: qty 250

## 2020-11-16 MED ORDER — SODIUM CHLORIDE 0.9 % IV SOLN
10.0000 mg | Freq: Once | INTRAVENOUS | Status: AC
  Administered 2020-11-16: 10 mg via INTRAVENOUS
  Filled 2020-11-16: qty 10

## 2020-11-16 MED ORDER — METHOTREXATE SODIUM (PF) CHEMO INJECTION 250 MG/10ML
40.0000 mg/m2 | Freq: Once | INTRAMUSCULAR | Status: AC
  Administered 2020-11-16: 70 mg via INTRAVENOUS
  Filled 2020-11-16: qty 2.8

## 2020-11-16 MED ORDER — FLUOROURACIL CHEMO INJECTION 2.5 GM/50ML
600.0000 mg/m2 | Freq: Once | INTRAVENOUS | Status: AC
  Administered 2020-11-16: 1050 mg via INTRAVENOUS
  Filled 2020-11-16: qty 21

## 2020-11-16 NOTE — Progress Notes (Signed)
Per Dr. Jana Hakim - okay to treat with [place] of 1.1.

## 2020-11-16 NOTE — Patient Instructions (Signed)
[facility]   Discharge Instructions: Thank you for choosing [facility] to provide your oncology and hematology care.   If you have a lab appointment with the [facility], please go directly to the [facility] and check in at the registration area.   Wear comfortable clothing and clothing appropriate for easy access to any Portacath or PICC line.   We strive to give you quality time with your provider. You may need to reschedule your appointment if you arrive late (15 or more minutes).  Arriving late affects you and other patients whose appointments are after yours.  Also, if you miss three or more appointments without notifying the office, you may be dismissed from the clinic at the provider's discretion.      For prescription refill requests, have your pharmacy contact our office and allow 72 hours for refills to be completed.    Today you received the following chemotherapy and/or immunotherapy agents: Trastuzumab (Herceptin), Cyclophosphamide (Cytoxan), Methotrexate, and Fluorouracil (Adrucil)   To help prevent nausea and vomiting after your treatment, we encourage you to take your nausea medication as directed.  BELOW ARE SYMPTOMS THAT SHOULD BE REPORTED IMMEDIATELY: . *FEVER GREATER THAN 100.4 F (38 C) OR HIGHER . *CHILLS OR SWEATING . *NAUSEA AND VOMITING THAT IS NOT CONTROLLED WITH YOUR NAUSEA MEDICATION . *UNUSUAL SHORTNESS OF BREATH . *UNUSUAL BRUISING OR BLEEDING . *URINARY PROBLEMS (pain or burning when urinating, or frequent urination) . *BOWEL PROBLEMS (unusual diarrhea, constipation, pain near the anus) . TENDERNESS IN MOUTH AND THROAT WITH OR WITHOUT PRESENCE OF ULCERS (sore throat, sores in mouth, or a toothache) . UNUSUAL RASH, SWELLING OR PAIN  . UNUSUAL VAGINAL DISCHARGE OR ITCHING   Items with * indicate a potential emergency and should be followed up as soon as possible or go to the Emergency Department if any  problems should occur.  Please show the CHEMOTHERAPY ALERT CARD or IMMUNOTHERAPY ALERT CARD at check-in to the Emergency Department and triage nurse.  Should you have questions after your visit or need to cancel or reschedule your appointment, please contact [facility]  Dept: [phone]  and follow the prompts.  Office hours are 8:00 a.m. to 4:30 p.m. Monday - Friday. Please note that voicemails left after 4:00 p.m. may not be returned until the following business day.  We are closed weekends and major holidays. You have access to a nurse at all times for urgent questions. Please call the main number to the clinic Dept: [phone] and follow the prompts.   For any non-urgent questions, you may also contact your provider using MyChart. We now offer e-Visits for anyone 10 and older to request care online for non-urgent symptoms. For details visit [email or web address].   Also download the MyChart app! Go to the app store, search "MyChart", open the app, select [facility], and log in with your MyChart username and password.  Due to Covid, a mask is required upon entering the hospital/clinic. If you do not have a mask, one will be given to you upon arrival. For doctor visits, patients may have 1 support person aged 48 or older with them. For treatment visits, patients cannot have anyone with them due to current Covid guidelines and our immunocompromised population.

## 2020-11-18 ENCOUNTER — Other Ambulatory Visit: Payer: Self-pay

## 2020-11-18 ENCOUNTER — Inpatient Hospital Stay: Payer: Medicare PPO | Attending: Oncology

## 2020-11-18 VITALS — BP 142/70 | HR 71 | Temp 98.4°F | Resp 18

## 2020-11-18 DIAGNOSIS — Z5189 Encounter for other specified aftercare: Secondary | ICD-10-CM | POA: Insufficient documentation

## 2020-11-18 DIAGNOSIS — Z5111 Encounter for antineoplastic chemotherapy: Secondary | ICD-10-CM | POA: Diagnosis not present

## 2020-11-18 DIAGNOSIS — Z8041 Family history of malignant neoplasm of ovary: Secondary | ICD-10-CM | POA: Insufficient documentation

## 2020-11-18 DIAGNOSIS — I10 Essential (primary) hypertension: Secondary | ICD-10-CM | POA: Diagnosis not present

## 2020-11-18 DIAGNOSIS — Z171 Estrogen receptor negative status [ER-]: Secondary | ICD-10-CM

## 2020-11-18 DIAGNOSIS — Z79899 Other long term (current) drug therapy: Secondary | ICD-10-CM | POA: Insufficient documentation

## 2020-11-18 DIAGNOSIS — Z803 Family history of malignant neoplasm of breast: Secondary | ICD-10-CM | POA: Diagnosis not present

## 2020-11-18 DIAGNOSIS — Z8042 Family history of malignant neoplasm of prostate: Secondary | ICD-10-CM | POA: Diagnosis not present

## 2020-11-18 DIAGNOSIS — Z923 Personal history of irradiation: Secondary | ICD-10-CM | POA: Insufficient documentation

## 2020-11-18 DIAGNOSIS — C50411 Malignant neoplasm of upper-outer quadrant of right female breast: Secondary | ICD-10-CM | POA: Diagnosis not present

## 2020-11-18 DIAGNOSIS — Z5112 Encounter for antineoplastic immunotherapy: Secondary | ICD-10-CM | POA: Diagnosis not present

## 2020-11-18 MED ORDER — PEGFILGRASTIM-JMDB 6 MG/0.6ML ~~LOC~~ SOSY
PREFILLED_SYRINGE | SUBCUTANEOUS | Status: AC
  Filled 2020-11-18: qty 0.6

## 2020-11-18 MED ORDER — PEGFILGRASTIM-JMDB 6 MG/0.6ML ~~LOC~~ SOSY
6.0000 mg | PREFILLED_SYRINGE | Freq: Once | SUBCUTANEOUS | Status: AC
  Administered 2020-11-18: 6 mg via SUBCUTANEOUS

## 2020-11-18 NOTE — Patient Instructions (Signed)
Pegfilgrastim injection What is this medicine? PEGFILGRASTIM (PEG fil gra stim) is a long-acting granulocyte colony-stimulating factor that stimulates the growth of neutrophils, a type of white blood cell important in the body's fight against infection. It is used to reduce the incidence of fever and infection in patients with certain types of cancer who are receiving chemotherapy that affects the bone marrow, and to increase survival after being exposed to high doses of radiation. This medicine may be used for other purposes; ask your health care provider or pharmacist if you have questions. COMMON BRAND NAME(S): Fulphila, Neulasta, Nyvepria, UDENYCA, Ziextenzo What should I tell my health care provider before I take this medicine? They need to know if you have any of these conditions:  kidney disease  latex allergy  ongoing radiation therapy  sickle cell disease  skin reactions to acrylic adhesives (On-Body Injector only)  an unusual or allergic reaction to pegfilgrastim, filgrastim, other medicines, foods, dyes, or preservatives  pregnant or trying to get pregnant  breast-feeding How should I use this medicine? This medicine is for injection under the skin. If you get this medicine at home, you will be taught how to prepare and give the pre-filled syringe or how to use the On-body Injector. Refer to the patient Instructions for Use for detailed instructions. Use exactly as directed. Tell your healthcare provider immediately if you suspect that the On-body Injector may not have performed as intended or if you suspect the use of the On-body Injector resulted in a missed or partial dose. It is important that you put your used needles and syringes in a special sharps container. Do not put them in a trash can. If you do not have a sharps container, call your pharmacist or healthcare provider to get one. Talk to your pediatrician regarding the use of this medicine in children. While this drug  may be prescribed for selected conditions, precautions do apply. Overdosage: If you think you have taken too much of this medicine contact a poison control center or emergency room at once. NOTE: This medicine is only for you. Do not share this medicine with others. What if I miss a dose? It is important not to miss your dose. Call your doctor or health care professional if you miss your dose. If you miss a dose due to an On-body Injector failure or leakage, a new dose should be administered as soon as possible using a single prefilled syringe for manual use. What may interact with this medicine? Interactions have not been studied. This list may not describe all possible interactions. Give your health care provider a list of all the medicines, herbs, non-prescription drugs, or dietary supplements you use. Also tell them if you smoke, drink alcohol, or use illegal drugs. Some items may interact with your medicine. What should I watch for while using this medicine? Your condition will be monitored carefully while you are receiving this medicine. You may need blood work done while you are taking this medicine. Talk to your health care provider about your risk of cancer. You may be more at risk for certain types of cancer if you take this medicine. If you are going to need a MRI, CT scan, or other procedure, tell your doctor that you are using this medicine (On-Body Injector only). What side effects may I notice from receiving this medicine? Side effects that you should report to your doctor or health care professional as soon as possible:  allergic reactions (skin rash, itching or hives, swelling of   the face, lips, or tongue)  back pain  dizziness  fever  pain, redness, or irritation at site where injected  pinpoint red spots on the skin  red or dark-brown urine  shortness of breath or breathing problems  stomach or side pain, or pain at the shoulder  swelling  tiredness  trouble  passing urine or change in the amount of urine  unusual bruising or bleeding Side effects that usually do not require medical attention (report to your doctor or health care professional if they continue or are bothersome):  bone pain  muscle pain This list may not describe all possible side effects. Call your doctor for medical advice about side effects. You may report side effects to FDA at 1-800-FDA-1088. Where should I keep my medicine? Keep out of the reach of children. If you are using this medicine at home, you will be instructed on how to store it. Throw away any unused medicine after the expiration date on the label. NOTE: This sheet is a summary. It may not cover all possible information. If you have questions about this medicine, talk to your doctor, pharmacist, or health care provider.  2021 Elsevier/Gold Standard (2019-06-27 13:20:51)  

## 2020-11-22 ENCOUNTER — Encounter: Payer: Self-pay | Admitting: Oncology

## 2020-11-23 ENCOUNTER — Telehealth: Payer: Self-pay | Admitting: *Deleted

## 2020-11-23 NOTE — Telephone Encounter (Signed)
This RN spoke with pt per her call stating onset yesterday late day of " welts ".  She states first started on inner thigh, then under her left arm - she noticed them across her abdomen and a few on her chest.  They itched mildly.  Before she took a benadryl she noticed the area on her inner thigh was resolved.  She took 1 benadryl and slept well.  This morning all welts are resolved.  She states the only different " food " she ate that is not her usual was a mango and a pear ( though she has had them before ).  She completed her 6th cycle of CMF with [place] last week.  Of note she developed facial flushing day post treatment ( which she also had at the beginning of therapy as well ).  Per discussion - she has no current issues.  She is scheduled for MD follow up prior to her next treatment.  She understands to call if symptoms recur or she has other issues.

## 2020-12-01 ENCOUNTER — Ambulatory Visit: Payer: Medicare PPO | Admitting: Radiation Oncology

## 2020-12-01 ENCOUNTER — Telehealth: Payer: Self-pay

## 2020-12-01 NOTE — Telephone Encounter (Signed)
Patient called this morning to state she found out she had a possible COVID exposure, this past Friday 11/26/20. She denied feeling symptomatic, but wanted to know if she should still come into clinic for F/U with Dr. Isidore Moos. I offered a phone assessment instead out of caution, and patient accepted.  I let the patient know that I had spoken with Dr. Isidore Moos, and she wanted them to know the importance of washing their hands for at least 20 seconds at a time, especially after going out in public, and before they eat.  Limit going out in public whenever possible. Do not touch your face, unless your hands are clean, such as when bathing. Get plenty of rest, eat well, and stay hydrated. Patient verbalized understanding and agreement.  The patient denies any symptomatic concerns.  She denies any lingering fatigue, except for days when she has her systemic treatments. She denies any chest pain, shortness of breath, or difficulty with range of motion to the left arm. Specifically, she reports good healing of her skin in the radiation fields.  She reports her skin never blistered or peeled after finishing radiation, and her color has almost completely returned to her baseline hue. I recommended that she continue skin care by applying oil or lotion with vitamin E to the skin in the radiation fields, BID, for 2 more months.  She reports mild swelling around left areola, so I encouraged her to continue manual massage exercises learned during PT session. She denied any issues sleeping or changes in appetite, and reports that overall she feels well and is pleased with her recovery thus far.  Continue follow-up with medical oncology - follow-up is scheduled for 12/07/2020 with Dr. Lurline Del before her next cycle of CMF/trastuzumab.  I explained that yearly mammograms are important for patients with intact breast tissue, and physical exams are important after mastectomy for patients that cannot undergo mammography.  Patient verbalized understanding and agreement.   I encouraged her to call if she had further questions or concerns about her healing. Otherwise, she will follow-up PRN in radiation oncology. Patient is pleased with this plan, and we will cancel her upcoming follow-up to reduce the risk of COVID-19 transmission.

## 2020-12-06 NOTE — Progress Notes (Signed)
[place]  [phone] [phone]     ID: April [name] DOB: 02-20-1951  MR#: [id number]  YJE#:[id number]  Patient Care Team: Kelton Pillar, MD as PCP - General (Family Medicine) Janaki Exley, Virgie Dad, MD as Consulting Physician (Oncology) Rolm Bookbinder, MD as Consulting Physician (General Surgery) Eppie Gibson, MD as Attending Physician (Radiation Oncology) Newt Minion, MD as Consulting Physician (Orthopedic Surgery) Regal, Tamala Fothergill, DPM as Consulting Physician (Podiatry) Neldon Mc, Donnamarie Poag, MD as Consulting Physician (Allergy and Immunology) Mauro Kaufmann, RN as Oncology Nurse Navigator Rockwell Germany, RN as Oncology Nurse Navigator Bensimhon, Shaune Pascal, MD as Consulting Physician (Cardiology) OTHER MD:  CHIEF COMPLAINT: Estrogen receptor negative breast cancer  CURRENT TREATMENT: Adjuvant chemo / immunotherapy   INTERVAL HISTORY: April [name] returns today for follow up and treatment of her new left breast cancer.   She began adjuvant CMF therapy on 07/27/2020.  Today is day 1 cycle 7.  She tolerates this well, with no unusual side effects other than mild constipation.  She now has that under good control using stool softeners twice a day and MiraLAX daily.  She also receives trastuzumab every 3 weeks.  Her most recent echocardiogram on 08/13/2020 showed an ejection fraction of 65-70%.   REVIEW OF SYSTEMS: April [name] is having a repeat chest x-ray today which is delaying her treatments to make sure the port is located exactly where it should be is the original study, 07/13/2020, reviewed today, was vague.  She is tolerating treatment well and has taken a couple of intervening trips to see her granddaughters fifth grade graduation and also because her nephew plays for the major league's and she went to [place] to see 4 of his games.  A detailed review of systems today was otherwise stable   COVID 19 VACCINATION STATUS: fully vaccinated  [employer]), with booster 01/2020   LEFT BREAST CANCER HISTORY: From the original intake note:  She underwent bilateral diagnostic mammography with tomography at [facility] on 06/15/2020 showing: breast density category C; this showed in the right breast evidence of postsurgical and radiation changes but this was all stable.  On the left however there was a new group of pleomorphic calcifications in the upper inner quadrant..  She proceeded to biopsy of the left breast area in question on 06/15/2020. Pathology from the procedure ([id number]) showed: invasive ductal carcinoma, grade 3; high-grade ductal carcinoma in situ with necrosis; lymphovascular invasion identified. Prognostic indicators significant for: estrogen receptor 40% positive with weak staining intensity; progesterone receptor 0% negative. Proliferation marker Ki67 of 45%. Her2 positive by immunohistochemistry (3+).   RIGHT BREAST CANCER HISTORY: From the original intake note:  "April [name]" had bilateral screening mammography at [facility] 06/06/2017.  This showed a possible mass in the right breast at the 11 o'clock position.  On 06/13/2017 she underwent right diagnostic mammography and ultrasonography.  Breast density was category C.  In the right breast at the 11 o'clock position there was a 1 cm area by mammography.  By ultrasound this confirmed a 1.0 cm irregular mass with lobulated margins in the upper outer quadrant of the right breast.  There was a second, 0.4 cm lobulated mass in the same quadrant.  The right axilla was sonographically benign.  On 06/20/2017 biopsy of the 2 right breast masses in question was performed.  The final pathology ([id number]) found the smaller mass to be only fibrocystic change.  This is felt to be concordant.  The larger mass however was an invasive ductal carcinoma,  grade 3, estrogen and progesterone receptor negative, with an MIB-1 of 30%, and HER-2 amplified, with a signals ratio of 2.24.  The number per cell was  4.60.  The patient's subsequent history is as detailed below.   PAST MEDICAL HISTORY: Past Medical History:  Diagnosis Date   Breast cancer ([place]) 05/2017   right breast   Eczema    Family history of breast cancer    Family history of ovarian cancer    History of radiation therapy 10/17/17- 11/14/17   40.05 directed to the right breast in 15 fractions, followed by a boost of 10 gy given in 5 fractions.    Hypertension    toxemia during pregnancy, no meds now    PAST SURGICAL HISTORY: Past Surgical History:  Procedure Laterality Date   ABDOMINAL HYSTERECTOMY     BREAST LUMPECTOMY WITH RADIOACTIVE SEED AND SENTINEL LYMPH NODE BIOPSY Right 07/17/2017   Procedure: BREAST LUMPECTOMY WITH RADIOACTIVE SEED AND SENTINEL LYMPH NODE BIOPSY;  Surgeon: Rolm Bookbinder, MD;  Location: [facility];  Service: General;  Laterality: Right;   BREAST LUMPECTOMY WITH RADIOACTIVE SEED AND SENTINEL LYMPH NODE BIOPSY Left 07/13/2020   Procedure: LEFT BREAST LUMPECTOMY WITH RADIOACTIVE SEED AND LEFT AXILLARY SENTINEL LYMPH NODE BIOPSY;  Surgeon: Rolm Bookbinder, MD;  Location: [facility] [place];  Service: General;  Laterality: Left;   CESAREAN SECTION     x4   DILATION AND CURETTAGE OF UTERUS     KNEE SURGERY Right    PORT-A-CATH REMOVAL Right 09/04/2018   Procedure: REMOVAL PORT-A-CATH;  Surgeon: Rolm Bookbinder, MD;  Location: [facility];  Service: General;  Laterality: Right;   PORTACATH PLACEMENT N/A 07/17/2017   Procedure: INSERTION PORT-A-CATH WITH [place];  Surgeon: Rolm Bookbinder, MD;  Location: [place];  Service: General;  Laterality: N/A;   PORTACATH PLACEMENT Right 07/13/2020   Procedure: INSERTION PORT-A-CATH WITH ULTRASOUND GUIDANCE;  Surgeon: Rolm Bookbinder, MD;  Location: [place];  Service: General;  Laterality: Right;    FAMILY HISTORY Family History  Problem Relation Age of Onset   Breast cancer Mother 62        again at 18 in other breast    Heart attack Father 83   Breast cancer Sister 81   Breast cancer Maternal Grandmother 19       spread to lungs, died at 24   Ovarian cancer Cousin 56   Prostate cancer Cousin   The patient's father died at age 76 from a heart attack.  The patient's mother is currently living at age 51 (as of January 2019).  The patient had 1 sister who was diagnosed with breast cancer at age 41 and died from metastatic disease at age 52.  The patient has 1 brother.  In addition the patient's mother was diagnosed with breast cancer at age 56, on the left side, and now has a right-sided breast cancer diagnosed in January 2019.  There is in addition a cousin with ovarian cancer diagnosed when she was 71 years old   GYNECOLOGIC HISTORY:  No LMP recorded. Patient has had a hysterectomy. Menarche age 39, first live birth age 27, the patient had 3 live births, 1 of whom survived only 2 days.  She underwent hysterectomy without salpingo-oophorectomy September 05, 1978.  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  Her husband [name] his Theme park manager at [employer].  The patient's daughter April [name] lives in [place] where she is an Tourist information centre manager.  The patient's daughter April [name] lives in [place] working for the department of defense.  The patient has 3 grandchildren, 1 of whom is in the seventh grade but is actually the captain of the eighth grade basketball team and is playing in a championship February 2020.   ADVANCED DIRECTIVES: Not in place   HEALTH MAINTENANCE: Social History   Tobacco Use   Smoking status: Never   Smokeless tobacco: Never  Vaping Use   Vaping Use: Never used  Substance Use Topics   Alcohol use: Yes    Comment: social   Drug use: No     Colonoscopy: April  2018/[facility]  PAP: Status post hysterectomy  Bone density:   Allergies  Allergen Reactions   Tape Itching and Rash   Betamethasone Dipropionate Other (See Comments)   Cetirizine Hcl Other (See Comments)   Lisinopril Other (See Comments)   Loratadine Other (See Comments)   Nsaids     Stomach issue   Other Other (See Comments)   Statins Other (See Comments)   Sulfa Antibiotics Rash    Current Outpatient Medications  Medication Sig Dispense Refill   aspirin 81 MG chewable tablet Chew 81 mg by mouth daily.     Calcium-Vitamin D-Vitamin K (CALCIUM + D) [phone] MG-UNT-MCG CHEW      co-enzyme Q-10 30 MG capsule Take 30 mg by mouth daily.     hydrochlorothiazide (MICROZIDE) 12.5 MG capsule TAKE 1 CAPSULE BY MOUTH EVERY DAY 90 capsule 3   lidocaine-prilocaine (EMLA) cream Apply 1 application topically as needed. 30 g 0   lidocaine-prilocaine (EMLA) cream Apply to affected area once 30 g 3   magnesium citrate SOLN Take 1/2 bottle now; if no results after 2 hours take rest of the bottle 296 mL 0   Omega-3 Fatty Acids (FISH OIL) 1000 MG CAPS Take 1 capsule by mouth daily.     polyethylene glycol powder (MIRALAX) 17 GM/SCOOP powder Take 17 g by mouth daily. 255 g 0   prochlorperazine (COMPAZINE) 10 MG tablet Take 1 tablet (10 mg total) by mouth every 6 (six) hours as needed (Nausea or vomiting). 30 tablet 1   Red Yeast Rice Extract (RED YEAST RICE PO) Take by mouth.     senna (SENOKOT) 8.6 MG TABS tablet Take 2 tablets (17.2 mg total) by mouth in the morning and at bedtime. 120 tablet 0   traMADol (ULTRAM) 50 MG tablet Take 1 tablet (50 mg total) by mouth every 6 (six) hours as needed. 10 tablet 0   vitamin B-12 (CYANOCOBALAMIN) 1000 MCG tablet Take 1,000 mcg by mouth daily.     vitamin C (ASCORBIC ACID) 250 MG tablet Take 250 mg by mouth daily.     No current facility-administered medications for this visit.   Facility-Administered Medications Ordered in Other Visits  Medication Dose  Route Frequency Provider Last Rate Last Admin   0.9 %  sodium chloride infusion   Intravenous Once Sharonna Vinje, Virgie Dad, MD       heparin lock flush 100 unit/mL  500 Units Intracatheter Once PRN Luman Holway, Virgie Dad, MD       sodium chloride flush (NS) 0.9 % injection 10 mL  10 mL Intracatheter PRN Catrell Morrone, Virgie Dad, MD       sodium chloride flush (NS) 0.9 % injection 10 mL  10 mL Intracatheter PRN Thaddeaus Monica,  Virgie Dad, MD   10 mL at 12/07/20 1456    OBJECTIVE: African-American woman examined in the treatment area There were no vitals filed for this visit.    There is no height or weight on file to calculate BMI.    For vitals associated with the 12/07/2020 visit please see the treatment area flowsheet   Wt Readings from Last 3 Encounters:  11/16/20 152 lb 1.6 oz (69 kg)  10/05/20 152 lb 12.8 oz (69.3 kg)  09/28/20 152 lb 1.6 oz (69 kg)  ECOG FS:1 - Symptomatic but completely ambulatory   Sclerae unicteric, EOMs intact Wearing a mask No cervical or supraclavicular adenopathy Lungs no rales or rhonchi Heart regular rate and rhythm Abd soft, nontender, positive bowel sounds MSK no focal spinal tenderness, no upper extremity lymphedema Neuro: nonfocal, well oriented, appropriate affect Breasts: Deferred   LAB RESULTS:  CMP     Component Value Date/Time   NA 139 12/07/2020 1010   K 3.6 12/07/2020 1010   CL 105 12/07/2020 1010   CO2 27 12/07/2020 1010   GLUCOSE 112 (H) 12/07/2020 1010   BUN 17 12/07/2020 1010   CREATININE 1.09 (H) 12/07/2020 1010   CALCIUM 9.6 12/07/2020 1010   PROT 7.3 12/07/2020 1010   ALBUMIN 4.2 12/07/2020 1010   AST 40 12/07/2020 1010   ALT 47 (H) 12/07/2020 1010   ALKPHOS 72 12/07/2020 1010   BILITOT 0.3 12/07/2020 1010   GFRNONAA 55 (L) 12/07/2020 1010   GFRAA >60 07/10/2019 1008   GFRAA >60 06/27/2017 0843    No results found for: TOTALPROTELP, ALBUMINELP, A1GS, A2GS, BETS, BETA2SER, GAMS, MSPIKE, SPEI  No results found for: KPAFRELGTCHN,  LAMBDASER, KAPLAMBRATIO  Lab Results  Component Value Date   WBC 2.5 (L) 12/07/2020   NEUTROABS 1.2 (L) 12/07/2020   HGB 10.1 (L) 12/07/2020   HCT 29.6 (L) 12/07/2020   MCV 93.7 12/07/2020   PLT 193 12/07/2020   No results found for: LABCA2  No components found for: ZRAQTM226  No results for input(s): INR in the last 168 hours.  No results found for: LABCA2  No results found for: JFH545  No results found for: GYB638  No results found for: LHT342  No results found for: CA2729  No components found for: HGQUANT  No results found for: CEA1 / No results found for: CEA1   No results found for: AFPTUMOR  No results found for: CHROMOGRNA  No results found for: HGBA, HGBA2QUANT, HGBFQUANT, HGBSQUAN (Hemoglobinopathy evaluation)   No results found for: LDH  Lab Results  Component Value Date   IRON 77 05/21/2018   TIBC 404 05/21/2018   IRONPCTSAT 19 (L) 05/21/2018   (Iron and TIBC)  Lab Results  Component Value Date   FERRITIN 24 05/21/2018    Urinalysis    Component Value Date/Time   COLORURINE COLORLESS (A) 08/09/2017 2224   APPEARANCEUR CLEAR 08/09/2017 2224   LABSPEC 1.004 (L) 08/09/2017 2224   PHURINE 6.0 08/09/2017 2224   GLUCOSEU NEGATIVE 08/09/2017 2224   HGBUR NEGATIVE 08/09/2017 2224   BILIRUBINUR NEGATIVE 08/09/2017 2224   KETONESUR NEGATIVE 08/09/2017 2224   PROTEINUR NEGATIVE 08/09/2017 2224   NITRITE NEGATIVE 08/09/2017 2224   LEUKOCYTESUR NEGATIVE 08/09/2017 2224    STUDIES: DG Chest 1 View  Result Date: 12/07/2020 CLINICAL DATA:  Evaluate chest port EXAM: CHEST  1 VIEW COMPARISON:  07/17/2017, 07/13/2020 FINDINGS: Right IJ approach Port-A-Cath identified with distal tip terminating at the level of the proximal to mid SVC. Heart size  is normal. Atherosclerotic calcification of the aortic knob. No focal airspace consolidation, pleural effusion, or pneumothorax. Surgical clips project over the bilateral chest wall. IMPRESSION: 1. Right IJ  approach Port-A-Cath identified with distal tip terminating at the level of the proximal to mid SVC. 2. No acute cardiopulmonary findings. Electronically Signed   By: Davina Poke D.O.   On: 12/07/2020 11:31     ELIGIBLE FOR AVAILABLE RESEARCH PROTOCOL: no   ASSESSMENT: 70 y.o. [place] woman   RIGHT BREAST CANCER (0) status post right breast upper outer quadrant biopsy 06/20/2017 for a clinical T1b N0, stage IA invasive ductal carcinoma, grade 3, estrogen and progesterone receptor negative, but HER-2 amplified, with an MIB-1 of 30%  (1) genetics testing 07/05/2017 through the Common Hereditary Cancer Panel offered by [facility] found no deleterious mutations in APC, ATM, AXIN2, BARD1, BMPR1A, BRCA1, BRCA2, BRIP1, CDH1, CDKN2A (p14ARF), CDKN2A (p16INK4a), CKD4, CHEK2, CTNNA1, DICER1, EPCAM (Deletion/duplication testing only), GREM1 (promoter region deletion/duplication testing only), KIT, MEN1, MLH1, MSH2, MSH3, MSH6, MUTYH, NBN, NF1, NHTL1, PALB2, PDGFRA, PMS2, POLD1, POLE, PTEN, RAD50, RAD51C, RAD51D, SDHB, SDHC, SDHD, SMAD4, SMARCA4. STK11, TP53, TSC1, TSC2, and VHL.  The following genes were evaluated for sequence changes only: SDHA and HOXB13 c.251G>A variant only.  (a) a Variant of uncertain significance in MSH2 was identified c.1331G>T (p.Arg444Leu).   (2) status post right lumpectomy and sentinel lymph node sampling 07/17/2017 for a pT1c pN0, stage IA invasive ductal carcinoma, grade 2, with negative margins.  A total of 5 lymph nodes were removed  (3) adjuvant chemotherapy consisting of paclitaxel weekly x12 together with trastuzumab every 21 days starting 08/07/2017  (a) paclitaxel stopped after 8 doses because of neuropathy (last dose 09/25/2017  (4) trastuzumab continued to total 1 year (last dose 07/23/2018)  (a) echo 08/10/2017 showed an ejection fraction in the 65-70% range  (b) echo on 11/01/2017 shows EF of 60-65%  (c) cardiogram 02/05/2018 shows an ejection  fraction in the 60-65% range.  (d) echocardiogram 05/28/2018 shows an ejection fraction in the 65-70%.  (5) adjuvant radiation 10/17/2017-11/14/2016: 40.05 Gy directed to the Right Breast in 15 fractions, followed by a boost of 10 Gy given in 5 fractions   (6) anemia, with normal MCV, ferritin, B12, folate, and inappropriately normal reticulocyte count, consistent with anemia of chronic illness.  LEFT BREAST CANCER: (7) status post left breast upper inner quadrant biopsy 06/15/2020 for a clinical T1 N0 invasive ductal carcinoma, grade 3, weakly estrogen receptor positive, progesterone receptor negative, HER2 amplified, with an MIB-1 of 45%.  (8) status post left lumpectomy and sentinel lymph node sampling 07/13/2020 for a pT1c pN0, stage IB invasive ductal carcinoma, grade 3, with negative margins  (a) a total of 5 axilla lymph nodes were removed, all benign  (b) repeat prognostic panel again weakly estrogen receptor positive, progesterone receptor negative, now HER-2 not amplified  (9) adjuvant chemotherapy and anti-HER2 immunotherapy to start 07/27/2020, consisting of CMF chemotherapy to be repeated every 21 days and trastuzumab to be continued for 1 year.  (a) echocardiogram 05/28/2020 shows an ejection fraction in the 65-70% range  (b) echo 08/13/2020 shows an ejection fraction in the 65 to 70% range  (c) cycle 4 of CMF delayed because of neutropenia; full [place] added with subsequent cycles  (d) methotrexate omitted during radiation over 1 overlap  (10) trastuzumab to be continued a minimum of 1 year  (11) adjuvant radiation given concurrently with CMF and completed 10/19/2020  (12) to start letrozole at the completion  of local treatment   PLAN: Asiyah continues to tolerate CMF chemotherapy well and will receive her penultimate cycle today.  She receives growth factors on day 3 which is allowing [place] to keep her treatments on time  She will return in 3 weeks for her final chemotherapy  cycle and I will see her that day.  We will then discuss letrozole.  Of course she will continue to receive the trastuzumab to complete a year  Total encounter time today 20 minutes.*   Annetta Deiss, Virgie Dad, MD  12/07/20 6:11 PM Medical Oncology and Hematology [facility] [place], [place] [address] Tel. [phone]    Fax. [phone]   I, Wilburn Mylar, am acting as scribe for Dr. Virgie Dad. Tiffane Sheldon.  I, Lurline Del MD, have reviewed the above documentation for accuracy and completeness, and I agree with the above.   *Total Encounter Time as defined by the Centers for Medicare and Medicaid Services includes, in addition to the face-to-face time of a patient visit (documented in the note above) non-face-to-face time: obtaining and reviewing outside history, ordering and reviewing medications, tests or procedures, care coordination (communications with other health care professionals or caregivers) and documentation in the medical record.

## 2020-12-07 ENCOUNTER — Inpatient Hospital Stay: Payer: Medicare PPO

## 2020-12-07 ENCOUNTER — Other Ambulatory Visit: Payer: Self-pay | Admitting: *Deleted

## 2020-12-07 ENCOUNTER — Ambulatory Visit (HOSPITAL_COMMUNITY)
Admission: RE | Admit: 2020-12-07 | Discharge: 2020-12-07 | Disposition: A | Discharge status: Home / Self Care | Payer: Medicare PPO | Source: Ambulatory Visit | Attending: Oncology | Admitting: Oncology

## 2020-12-07 ENCOUNTER — Other Ambulatory Visit: Payer: Self-pay

## 2020-12-07 ENCOUNTER — Inpatient Hospital Stay: Payer: Medicare PPO | Admitting: Oncology

## 2020-12-07 VITALS — BP 141/68 | HR 65 | Temp 98.5°F | Resp 18

## 2020-12-07 DIAGNOSIS — C50411 Malignant neoplasm of upper-outer quadrant of right female breast: Secondary | ICD-10-CM | POA: Insufficient documentation

## 2020-12-07 DIAGNOSIS — Z171 Estrogen receptor negative status [ER-]: Secondary | ICD-10-CM

## 2020-12-07 DIAGNOSIS — I700 Atherosclerosis of aorta: Secondary | ICD-10-CM | POA: Diagnosis not present

## 2020-12-07 DIAGNOSIS — Z95828 Presence of other vascular implants and grafts: Secondary | ICD-10-CM

## 2020-12-07 DIAGNOSIS — Z923 Personal history of irradiation: Secondary | ICD-10-CM | POA: Diagnosis not present

## 2020-12-07 DIAGNOSIS — Z170 Estrogen receptor positive status [ER+]: Secondary | ICD-10-CM | POA: Diagnosis not present

## 2020-12-07 DIAGNOSIS — C50012 Malignant neoplasm of nipple and areola, left female breast: Secondary | ICD-10-CM | POA: Diagnosis not present

## 2020-12-07 DIAGNOSIS — Z8041 Family history of malignant neoplasm of ovary: Secondary | ICD-10-CM | POA: Diagnosis not present

## 2020-12-07 DIAGNOSIS — Z79899 Other long term (current) drug therapy: Secondary | ICD-10-CM | POA: Diagnosis not present

## 2020-12-07 DIAGNOSIS — Z5112 Encounter for antineoplastic immunotherapy: Secondary | ICD-10-CM | POA: Diagnosis not present

## 2020-12-07 DIAGNOSIS — Z5189 Encounter for other specified aftercare: Secondary | ICD-10-CM | POA: Diagnosis not present

## 2020-12-07 DIAGNOSIS — Z5111 Encounter for antineoplastic chemotherapy: Secondary | ICD-10-CM | POA: Diagnosis not present

## 2020-12-07 DIAGNOSIS — Z803 Family history of malignant neoplasm of breast: Secondary | ICD-10-CM | POA: Diagnosis not present

## 2020-12-07 DIAGNOSIS — I10 Essential (primary) hypertension: Secondary | ICD-10-CM | POA: Diagnosis not present

## 2020-12-07 LAB — CBC WITH DIFFERENTIAL (CANCER CENTER ONLY)
Abs Immature Granulocytes: 0.02 10*3/uL (ref 0.00–0.07)
Basophils Absolute: 0 10*3/uL (ref 0.0–0.1)
Basophils Relative: 1 %
Eosinophils Absolute: 0.1 10*3/uL (ref 0.0–0.5)
Eosinophils Relative: 5 %
HCT: 29.6 % — ABNORMAL LOW (ref 36.0–46.0)
Hemoglobin: 10.1 g/dL — ABNORMAL LOW (ref 12.0–15.0)
Immature Granulocytes: 1 %
Lymphocytes Relative: 28 %
Lymphs Abs: 0.7 10*3/uL (ref 0.7–4.0)
MCH: 32 pg (ref 26.0–34.0)
MCHC: 34.1 g/dL (ref 30.0–36.0)
MCV: 93.7 fL (ref 80.0–100.0)
Monocytes Absolute: 0.4 10*3/uL (ref 0.1–1.0)
Monocytes Relative: 17 %
Neutro Abs: 1.2 10*3/uL — ABNORMAL LOW (ref 1.7–7.7)
Neutrophils Relative %: 48 %
Platelet Count: 193 10*3/uL (ref 150–400)
RBC: 3.16 MIL/uL — ABNORMAL LOW (ref 3.87–5.11)
RDW: 14.8 % (ref 11.5–15.5)
WBC Count: 2.5 10*3/uL — ABNORMAL LOW (ref 4.0–10.5)
nRBC: 0 % (ref 0.0–0.2)

## 2020-12-07 LAB — CMP (CANCER CENTER ONLY)
ALT: 47 U/L — ABNORMAL HIGH (ref 0–44)
AST: 40 U/L (ref 15–41)
Albumin: 4.2 g/dL (ref 3.5–5.0)
Alkaline Phosphatase: 72 U/L (ref 38–126)
Anion gap: 7 (ref 5–15)
BUN: 17 mg/dL (ref 8–23)
CO2: 27 mmol/L (ref 22–32)
Calcium: 9.6 mg/dL (ref 8.9–10.3)
Chloride: 105 mmol/L (ref 98–111)
Creatinine: 1.09 mg/dL — ABNORMAL HIGH (ref 0.44–1.00)
GFR, Estimated: 55 mL/min — ABNORMAL LOW (ref >60)
Glucose, Bld: 112 mg/dL — ABNORMAL HIGH (ref 70–99)
Potassium: 3.6 mmol/L (ref 3.5–5.1)
Sodium: 139 mmol/L (ref 135–145)
Total Bilirubin: 0.3 mg/dL (ref 0.3–1.2)
Total Protein: 7.3 g/dL (ref 6.5–8.1)

## 2020-12-07 MED ORDER — ACETAMINOPHEN 325 MG PO TABS
ORAL_TABLET | ORAL | Status: AC
  Filled 2020-12-07: qty 2

## 2020-12-07 MED ORDER — TRASTUZUMAB-ANNS CHEMO 150 MG IV SOLR
6.0000 mg/kg | Freq: Once | INTRAVENOUS | Status: AC
  Administered 2020-12-07: 420 mg via INTRAVENOUS
  Filled 2020-12-07: qty 20

## 2020-12-07 MED ORDER — SODIUM CHLORIDE 0.9 % IV SOLN
600.0000 mg/m2 | Freq: Once | INTRAVENOUS | Status: AC
  Administered 2020-12-07: 1060 mg via INTRAVENOUS
  Filled 2020-12-07: qty 53

## 2020-12-07 MED ORDER — HEPARIN SOD (PORK) LOCK FLUSH 100 UNIT/ML IV SOLN
500.0000 [IU] | Freq: Once | INTRAVENOUS | Status: AC | PRN
  Administered 2020-12-07: 500 [IU]
  Filled 2020-12-07: qty 5

## 2020-12-07 MED ORDER — SODIUM CHLORIDE 0.9 % IV SOLN
Freq: Once | INTRAVENOUS | Status: AC
  Filled 2020-12-07: qty 250

## 2020-12-07 MED ORDER — SODIUM CHLORIDE 0.9 % IV SOLN
10.0000 mg | Freq: Once | INTRAVENOUS | Status: AC
  Administered 2020-12-07: 10 mg via INTRAVENOUS
  Filled 2020-12-07: qty 10

## 2020-12-07 MED ORDER — FLUOROURACIL CHEMO INJECTION 2.5 GM/50ML
600.0000 mg/m2 | Freq: Once | INTRAVENOUS | Status: AC
  Administered 2020-12-07: 1050 mg via INTRAVENOUS
  Filled 2020-12-07: qty 21

## 2020-12-07 MED ORDER — DIPHENHYDRAMINE HCL 25 MG PO CAPS
25.0000 mg | ORAL_CAPSULE | Freq: Once | ORAL | Status: AC
  Administered 2020-12-07: 25 mg via ORAL

## 2020-12-07 MED ORDER — HEPARIN SOD (PORK) LOCK FLUSH 100 UNIT/ML IV SOLN
500.0000 [IU] | Freq: Once | INTRAVENOUS | Status: DC | PRN
  Filled 2020-12-07: qty 5

## 2020-12-07 MED ORDER — PALONOSETRON HCL INJECTION 0.25 MG/5ML
0.2500 mg | Freq: Once | INTRAVENOUS | Status: AC
  Administered 2020-12-07: 0.25 mg via INTRAVENOUS

## 2020-12-07 MED ORDER — ACETAMINOPHEN 325 MG PO TABS
650.0000 mg | ORAL_TABLET | Freq: Once | ORAL | Status: AC
  Administered 2020-12-07: 650 mg via ORAL

## 2020-12-07 MED ORDER — SODIUM CHLORIDE 0.9% FLUSH
10.0000 mL | INTRAVENOUS | Status: DC | PRN
  Administered 2020-12-07: 10 mL
  Filled 2020-12-07: qty 10

## 2020-12-07 MED ORDER — PALONOSETRON HCL INJECTION 0.25 MG/5ML
INTRAVENOUS | Status: AC
  Filled 2020-12-07: qty 5

## 2020-12-07 MED ORDER — DIPHENHYDRAMINE HCL 25 MG PO CAPS
ORAL_CAPSULE | ORAL | Status: AC
  Filled 2020-12-07: qty 1

## 2020-12-07 MED ORDER — METHOTREXATE SODIUM (PF) CHEMO INJECTION 250 MG/10ML
40.0000 mg/m2 | Freq: Once | INTRAMUSCULAR | Status: AC
  Administered 2020-12-07: 70 mg via INTRAVENOUS
  Filled 2020-12-07: qty 2.8

## 2020-12-07 MED ORDER — SODIUM CHLORIDE 0.9 % IV SOLN
Freq: Once | INTRAVENOUS | Status: DC
  Filled 2020-12-07: qty 250

## 2020-12-07 MED ORDER — SODIUM CHLORIDE 0.9% FLUSH
10.0000 mL | INTRAVENOUS | Status: DC | PRN
  Filled 2020-12-07: qty 10

## 2020-12-07 NOTE — Progress Notes (Signed)
Per Dr. Jana Hakim OK to treat without CMP results and with ANC of 1.2

## 2020-12-07 NOTE — Patient Instructions (Addendum)
[facility] ONCOLOGY   Discharge Instructions: Thank you for choosing [facility] to provide your oncology and hematology care.   If you have a lab appointment with the [facility], please go directly to the [facility] and check in at the registration area.   Wear comfortable clothing and clothing appropriate for easy access to any Portacath or PICC line.   We strive to give you quality time with your provider. You may need to reschedule your appointment if you arrive late (15 or more minutes).  Arriving late affects you and other patients whose appointments are after yours.  Also, if you miss three or more appointments without notifying the office, you may be dismissed from the clinic at the provider's discretion.      For prescription refill requests, have your pharmacy contact our office and allow 72 hours for refills to be completed.    Today you received the following chemotherapy and/or immunotherapy agents: Cyclophosphamide and Fluorouracil (Adrucil). Methotrexate, Trastuzumab   To help prevent nausea and vomiting after your treatment, we encourage you to take your nausea medication as directed.  BELOW ARE SYMPTOMS THAT SHOULD BE REPORTED IMMEDIATELY: *FEVER GREATER THAN 100.4 F (38 C) OR HIGHER *CHILLS OR SWEATING *NAUSEA AND VOMITING THAT IS NOT CONTROLLED WITH YOUR NAUSEA MEDICATION *UNUSUAL SHORTNESS OF BREATH *UNUSUAL BRUISING OR BLEEDING *URINARY PROBLEMS (pain or burning when urinating, or frequent urination) *BOWEL PROBLEMS (unusual diarrhea, constipation, pain near the anus) TENDERNESS IN MOUTH AND THROAT WITH OR WITHOUT PRESENCE OF ULCERS (sore throat, sores in mouth, or a toothache) UNUSUAL RASH, SWELLING OR PAIN  UNUSUAL VAGINAL DISCHARGE OR ITCHING   Items with * indicate a potential emergency and should be followed up as soon as possible or go to the Emergency Department if any problems should occur.  Please show the  CHEMOTHERAPY ALERT CARD or IMMUNOTHERAPY ALERT CARD at check-in to the Emergency Department and triage nurse.  Should you have questions after your visit or need to cancel or reschedule your appointment, please contact [facility]  Dept: [phone]  and follow the prompts.  Office hours are 8:00 a.m. to 4:30 p.m. Monday - Friday. Please note that voicemails left after 4:00 p.m. may not be returned until the following business day.  We are closed weekends and major holidays. You have access to a nurse at all times for urgent questions. Please call the main number to the clinic Dept: [phone] and follow the prompts.   For any non-urgent questions, you may also contact your provider using MyChart. We now offer e-Visits for anyone 11 and older to request care online for non-urgent symptoms. For details visit [email or web address].   Also download the MyChart app! Go to the app store, search "MyChart", open the app, select [facility], and log in with your MyChart username and password.  Due to Covid, a mask is required upon entering the hospital/clinic. If you do not have a mask, one will be given to you upon arrival. For doctor visits, patients may have 1 support person aged 59 or older with them. For treatment visits, patients cannot have anyone with them due to current Covid guidelines and our immunocompromised population.

## 2020-12-09 ENCOUNTER — Other Ambulatory Visit: Payer: Self-pay

## 2020-12-09 ENCOUNTER — Inpatient Hospital Stay: Payer: Medicare PPO

## 2020-12-09 VITALS — BP 146/73 | HR 61 | Temp 98.4°F | Resp 16

## 2020-12-09 DIAGNOSIS — Z79899 Other long term (current) drug therapy: Secondary | ICD-10-CM | POA: Diagnosis not present

## 2020-12-09 DIAGNOSIS — I10 Essential (primary) hypertension: Secondary | ICD-10-CM | POA: Diagnosis not present

## 2020-12-09 DIAGNOSIS — Z5112 Encounter for antineoplastic immunotherapy: Secondary | ICD-10-CM | POA: Diagnosis not present

## 2020-12-09 DIAGNOSIS — Z5111 Encounter for antineoplastic chemotherapy: Secondary | ICD-10-CM | POA: Diagnosis not present

## 2020-12-09 DIAGNOSIS — Z803 Family history of malignant neoplasm of breast: Secondary | ICD-10-CM | POA: Diagnosis not present

## 2020-12-09 DIAGNOSIS — Z5189 Encounter for other specified aftercare: Secondary | ICD-10-CM | POA: Diagnosis not present

## 2020-12-09 DIAGNOSIS — Z171 Estrogen receptor negative status [ER-]: Secondary | ICD-10-CM

## 2020-12-09 DIAGNOSIS — Z923 Personal history of irradiation: Secondary | ICD-10-CM | POA: Diagnosis not present

## 2020-12-09 DIAGNOSIS — Z8041 Family history of malignant neoplasm of ovary: Secondary | ICD-10-CM | POA: Diagnosis not present

## 2020-12-09 DIAGNOSIS — C50411 Malignant neoplasm of upper-outer quadrant of right female breast: Secondary | ICD-10-CM | POA: Diagnosis not present

## 2020-12-09 MED ORDER — PEGFILGRASTIM-JMDB 6 MG/0.6ML ~~LOC~~ SOSY
6.0000 mg | PREFILLED_SYRINGE | Freq: Once | SUBCUTANEOUS | Status: AC
  Administered 2020-12-09: 6 mg via SUBCUTANEOUS

## 2020-12-09 MED ORDER — PEGFILGRASTIM-JMDB 6 MG/0.6ML ~~LOC~~ SOSY
PREFILLED_SYRINGE | SUBCUTANEOUS | Status: AC
  Filled 2020-12-09: qty 0.6

## 2020-12-09 NOTE — Patient Instructions (Signed)

## 2020-12-10 ENCOUNTER — Ambulatory Visit (HOSPITAL_COMMUNITY): Payer: Medicare PPO | Attending: Cardiovascular Disease

## 2020-12-10 DIAGNOSIS — C50411 Malignant neoplasm of upper-outer quadrant of right female breast: Secondary | ICD-10-CM | POA: Insufficient documentation

## 2020-12-10 DIAGNOSIS — I10 Essential (primary) hypertension: Secondary | ICD-10-CM | POA: Diagnosis not present

## 2020-12-10 DIAGNOSIS — Z171 Estrogen receptor negative status [ER-]: Secondary | ICD-10-CM | POA: Diagnosis not present

## 2020-12-10 DIAGNOSIS — Z01818 Encounter for other preprocedural examination: Secondary | ICD-10-CM | POA: Insufficient documentation

## 2020-12-10 DIAGNOSIS — Z0189 Encounter for other specified special examinations: Secondary | ICD-10-CM

## 2020-12-10 LAB — ECHOCARDIOGRAM COMPLETE
Area-P 1/2: 3.77 cm2
S' Lateral: 2.7 cm

## 2020-12-27 NOTE — Progress Notes (Signed)
[place]  [phone] [phone]     ID: April [name] DOB: 11/01/1950  MR#: [id number]  QMV#:[id number]  Patient Care Team: Kelton Pillar, MD as PCP - General (Family Medicine) Anjana Cheek, Virgie Dad, MD as Consulting Physician (Oncology) Rolm Bookbinder, MD as Consulting Physician (General Surgery) Eppie Gibson, MD as Attending Physician (Radiation Oncology) Newt Minion, MD as Consulting Physician (Orthopedic Surgery) Regal, Tamala Fothergill, DPM as Consulting Physician (Podiatry) Neldon Mc, Donnamarie Poag, MD as Consulting Physician (Allergy and Immunology) Mauro Kaufmann, RN as Oncology Nurse Navigator Rockwell Germany, RN as Oncology Nurse Navigator Bensimhon, Shaune Pascal, MD as Consulting Physician (Cardiology) OTHER MD:  CHIEF COMPLAINT: Estrogen receptor negative breast cancer  CURRENT TREATMENT: Completing adjuvant chemo /continuing immunotherapy/ considering letrozole   INTERVAL HISTORY: April [name] returns today for follow up and treatment of her new left breast cancer.  Today is her last chemotherapy cycle.  She began adjuvant CMF therapy on 07/27/2020.  Today is day 1 cycle 8.  She tolerates this well, with no unusual side effects other than mild constipation.  She now has that under good control using stool softeners twice a day and MiraLAX daily.  She receives growth factor on day 3 because of her borderline counts  She also receives trastuzumab every 3 weeks.  She has had no side effects from this that she is aware of  Since her last visit, she underwent repeat echocardiogram on 12/10/2020 showing an ejection fraction of 60-65%.   REVIEW OF SYSTEMS: Benicia has been able to kappa her hair right through all the treatments.  She occasionally has shooting pains in both breasts.  She knows that these are benign.  She is planning a lot of traveling including to [place] in December to follow her grandsons basketball progress.  He is now 68.  She  exercises by walking and doing some "vibration exercises, but has not yet approached her treadmill.  A detailed review of systems today was otherwise stable.   COVID 19 VACCINATION STATUS: fully vaccinated [employer]), with booster 01/2020   LEFT BREAST CANCER HISTORY: From the original intake note:  She underwent bilateral diagnostic mammography with tomography at [facility] on 06/15/2020 showing: breast density category C; this showed in the right breast evidence of postsurgical and radiation changes but this was all stable.  On the left however there was a new group of pleomorphic calcifications in the upper inner quadrant..  She proceeded to biopsy of the left breast area in question on 06/15/2020. Pathology from the procedure ([id number]) showed: invasive ductal carcinoma, grade 3; high-grade ductal carcinoma in situ with necrosis; lymphovascular invasion identified. Prognostic indicators significant for: estrogen receptor 40% positive with weak staining intensity; progesterone receptor 0% negative. Proliferation marker Ki67 of 45%. Her2 positive by immunohistochemistry (3+).   RIGHT BREAST CANCER HISTORY: From the original intake note:  "April [name]" had bilateral screening mammography at [facility] 06/06/2017.  This showed a possible mass in the right breast at the 11 o'clock position.  On 06/13/2017 she underwent right diagnostic mammography and ultrasonography.  Breast density was category C.  In the right breast at the 11 o'clock position there was a 1 cm area by mammography.  By ultrasound this confirmed a 1.0 cm irregular mass with lobulated margins in the upper outer quadrant of the right breast.  There was a second, 0.4 cm lobulated mass in the same quadrant.  The right axilla was sonographically benign.  On 06/20/2017 biopsy of the 2 right breast masses in question  was performed.  The final pathology ([id number]) found the smaller mass to be only fibrocystic change.  This is felt to be concordant.   The larger mass however was an invasive ductal carcinoma, grade 3, estrogen and progesterone receptor negative, with an MIB-1 of 30%, and HER-2 amplified, with a signals ratio of 2.24.  The number per cell was 4.60.  The patient's subsequent history is as detailed below.   PAST MEDICAL HISTORY: Past Medical History:  Diagnosis Date   Breast cancer ([place]) 05/2017   right breast   Eczema    Family history of breast cancer    Family history of ovarian cancer    History of radiation therapy 10/17/17- 11/14/17   40.05 directed to the right breast in 15 fractions, followed by a boost of 10 gy given in 5 fractions.    Hypertension    toxemia during pregnancy, no meds now    PAST SURGICAL HISTORY: Past Surgical History:  Procedure Laterality Date   ABDOMINAL HYSTERECTOMY     BREAST LUMPECTOMY WITH RADIOACTIVE SEED AND SENTINEL LYMPH NODE BIOPSY Right 07/17/2017   Procedure: BREAST LUMPECTOMY WITH RADIOACTIVE SEED AND SENTINEL LYMPH NODE BIOPSY;  Surgeon: Rolm Bookbinder, MD;  Location: [place];  Service: General;  Laterality: Right;   BREAST LUMPECTOMY WITH RADIOACTIVE SEED AND SENTINEL LYMPH NODE BIOPSY Left 07/13/2020   Procedure: LEFT BREAST LUMPECTOMY WITH RADIOACTIVE SEED AND LEFT AXILLARY SENTINEL LYMPH NODE BIOPSY;  Surgeon: Rolm Bookbinder, MD;  Location: [facility] [place];  Service: General;  Laterality: Left;   CESAREAN SECTION     x4   DILATION AND CURETTAGE OF UTERUS     KNEE SURGERY Right    PORT-A-CATH REMOVAL Right 09/04/2018   Procedure: REMOVAL PORT-A-CATH;  Surgeon: Rolm Bookbinder, MD;  Location: [place];  Service: General;  Laterality: Right;   PORTACATH PLACEMENT N/A 07/17/2017   Procedure: INSERTION PORT-A-CATH WITH [place];  Surgeon: Rolm Bookbinder, MD;  Location: [place];  Service: General;  Laterality: N/A;   PORTACATH PLACEMENT Right 07/13/2020   Procedure: INSERTION PORT-A-CATH WITH ULTRASOUND  GUIDANCE;  Surgeon: Rolm Bookbinder, MD;  Location: [place];  Service: General;  Laterality: Right;    FAMILY HISTORY Family History  Problem Relation Age of Onset   Breast cancer Mother 38       again at 42 in other breast    Heart attack Father 61   Breast cancer Sister 37   Breast cancer Maternal Grandmother 5       spread to lungs, died at 7   Ovarian cancer Cousin 56   Prostate cancer Cousin   The patient's father died at age 19 from a heart attack.  The patient's mother is currently living at age 30 (as of January 2019).  The patient had 1 sister who was diagnosed with breast cancer at age 30 and died from metastatic disease at age 22.  The patient has 1 brother.  In addition the patient's mother was diagnosed with breast cancer at age 56, on the left side, and now has a right-sided breast cancer diagnosed in January 2019.  There is in addition a cousin with ovarian cancer diagnosed when she was 71 years old   GYNECOLOGIC HISTORY:  No LMP recorded. Patient has had a hysterectomy. Menarche age 27, first live birth age 28, the patient had 3 live births, 1 of whom survived only 2 days.  She underwent hysterectomy without salpingo-oophorectomy September 05, 1978.  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  Her husband [name] his Theme park manager at [employer].  The patient's daughter [name] lives in [place] where she is an Tourist information centre manager.  The patient's daughter [name] lives in [place] working for the department of defense.  The patient has 3 grandchildren, 1 of whom is in the seventh grade but is actually the captain of the eighth grade basketball team and is playing in a championship February 2020.   ADVANCED DIRECTIVES: Not in place   HEALTH MAINTENANCE: Social History   Tobacco  Use   Smoking status: Never   Smokeless tobacco: Never  Vaping Use   Vaping Use: Never used  Substance Use Topics   Alcohol use: Yes    Comment: social   Drug use: No     Colonoscopy: April 2018/[facility]  PAP: Status post hysterectomy  Bone density:   Allergies  Allergen Reactions   Tape Itching and Rash   Betamethasone Dipropionate Other (See Comments)   Cetirizine Hcl Other (See Comments)   Lisinopril Other (See Comments)   Loratadine Other (See Comments)   Nsaids     Stomach issue   Other Other (See Comments)   Statins Other (See Comments)   Sulfa Antibiotics Rash    Current Outpatient Medications  Medication Sig Dispense Refill   aspirin 81 MG chewable tablet Chew 81 mg by mouth daily.     Calcium-Vitamin D-Vitamin K (CALCIUM + D) 3670677578-40 MG-UNT-MCG CHEW      co-enzyme Q-10 30 MG capsule Take 30 mg by mouth daily.     hydrochlorothiazide (MICROZIDE) 12.5 MG capsule TAKE 1 CAPSULE BY MOUTH EVERY DAY 90 capsule 3   lidocaine-prilocaine (EMLA) cream Apply 1 application topically as needed. 30 g 0   lidocaine-prilocaine (EMLA) cream Apply to affected area once 30 g 3   magnesium citrate SOLN Take 1/2 bottle now; if no results after 2 hours take rest of the bottle 296 mL 0   Omega-3 Fatty Acids (FISH OIL) 1000 MG CAPS Take 1 capsule by mouth daily.     polyethylene glycol powder (MIRALAX) 17 GM/SCOOP powder Take 17 g by mouth daily. 255 g 0   prochlorperazine (COMPAZINE) 10 MG tablet Take 1 tablet (10 mg total) by mouth every 6 (six) hours as needed (Nausea or vomiting). 30 tablet 1   Red Yeast Rice Extract (RED YEAST RICE PO) Take by mouth.     senna (SENOKOT) 8.6 MG TABS tablet Take 2 tablets (17.2 mg total) by mouth in the morning and at bedtime. 120 tablet 0   traMADol (ULTRAM) 50 MG tablet Take 1 tablet (50 mg total) by mouth every 6 (six) hours as needed. 10 tablet 0   vitamin B-12 (CYANOCOBALAMIN) 1000 MCG tablet Take 1,000 mcg by mouth daily.     vitamin C  (ASCORBIC ACID) 250 MG tablet Take 250 mg by mouth daily.     No current facility-administered medications for this visit.    OBJECTIVE: African-American woman in no acute distress  Vitals:   12/28/20 1046  BP: (!) 158/63  Pulse: 66  Resp: 17  Temp: (!) 97.2 F (36.2 C)  SpO2: 100%      Body mass index is 26.64 kg/m.    Wt Readings from Last 3 Encounters:  12/28/20 152 lb 12.8 oz (69.3 kg)  11/16/20 152 lb 1.6 oz (69 kg)  10/05/20 152 lb 12.8 oz (69.3 kg)  ECOG FS:1 - Symptomatic but completely ambulatory   Sclerae unicteric, EOMs intact Wearing a mask No cervical or supraclavicular adenopathy Lungs no rales or rhonchi Heart regular rate and rhythm Abd soft, nontender, positive bowel sounds MSK no focal spinal tenderness, no upper extremity lymphedema Neuro: nonfocal, well oriented, appropriate affect Breasts: The right breast is status post remote lumpectomy and radiation.  There is no evidence of local recurrence.  The left breast is status postlumpectomy and radiation.  There is more hyperpigmentation, but no evidence of disease recurrence.  Both axillae are benign.   LAB RESULTS:  CMP     Component Value Date/Time   NA 141 12/28/2020 1020   K 3.8 12/28/2020 1020   CL 106 12/28/2020 1020   CO2 29 12/28/2020 1020   GLUCOSE 102 (H) 12/28/2020 1020   BUN 16 12/28/2020 1020   CREATININE 0.97 12/28/2020 1020   CALCIUM 9.9 12/28/2020 1020   PROT 7.2 12/28/2020 1020   ALBUMIN 3.7 12/28/2020 1020   AST 24 12/28/2020 1020   ALT 27 12/28/2020 1020   ALKPHOS 80 12/28/2020 1020   BILITOT 0.2 (L) 12/28/2020 1020   GFRNONAA >60 12/28/2020 1020   GFRAA >60 07/10/2019 1008   GFRAA >60 06/27/2017 0843    No results found for: TOTALPROTELP, ALBUMINELP, A1GS, A2GS, BETS, BETA2SER, GAMS, MSPIKE, SPEI  No results found for: KPAFRELGTCHN, LAMBDASER, KAPLAMBRATIO  Lab Results  Component Value Date   WBC 2.9 (L) 12/28/2020   NEUTROABS 1.5 (L) 12/28/2020   HGB 10.3 (L)  12/28/2020   HCT 30.7 (L) 12/28/2020   MCV 95.3 12/28/2020   PLT 183 12/28/2020   No results found for: LABCA2  No components found for: EZMOQH476  No results for input(s): INR in the last 168 hours.  No results found for: LABCA2  No results found for: LYY503  No results found for: TWS568  No results found for: LEX517  No results found for: CA2729  No components found for: HGQUANT  No results found for: CEA1 / No results found for: CEA1   No results found for: AFPTUMOR  No results found for: CHROMOGRNA  No results found for: HGBA, HGBA2QUANT, HGBFQUANT, HGBSQUAN (Hemoglobinopathy evaluation)   No results found for: LDH  Lab Results  Component Value Date   IRON 77 05/21/2018   TIBC 404 05/21/2018   IRONPCTSAT 19 (L) 05/21/2018   (Iron and TIBC)  Lab Results  Component Value Date   FERRITIN 24 05/21/2018    Urinalysis    Component Value Date/Time   COLORURINE COLORLESS (A) 08/09/2017 2224   APPEARANCEUR CLEAR 08/09/2017 2224   LABSPEC 1.004 (L) 08/09/2017 2224   PHURINE 6.0 08/09/2017 2224   GLUCOSEU NEGATIVE 08/09/2017 2224   HGBUR NEGATIVE 08/09/2017 2224   BILIRUBINUR NEGATIVE 08/09/2017 2224   KETONESUR NEGATIVE 08/09/2017 2224   PROTEINUR NEGATIVE 08/09/2017 2224   NITRITE NEGATIVE 08/09/2017 2224   LEUKOCYTESUR NEGATIVE 08/09/2017 2224    STUDIES: DG Chest 1 View  Result Date: 12/07/2020 CLINICAL DATA:  Evaluate chest port EXAM: CHEST  1 VIEW COMPARISON:  07/17/2017, 07/13/2020 FINDINGS: Right IJ approach Port-A-Cath identified with distal tip terminating at the level of the proximal to mid SVC. Heart size is normal. Atherosclerotic calcification of the aortic knob. No focal airspace consolidation, pleural effusion, or pneumothorax. Surgical clips project over the bilateral chest wall. IMPRESSION: 1. Right IJ approach Port-A-Cath identified with distal tip terminating at  the level of the proximal to mid SVC. 2. No acute cardiopulmonary findings.  Electronically Signed   By: Davina Poke D.O.   On: 12/07/2020 11:31   ECHOCARDIOGRAM COMPLETE  Result Date: 12/10/2020    ECHOCARDIOGRAM REPORT   Patient Name:   MALAN WERK Date of Exam: 12/10/2020 Medical Rec #:  [id number]           Height:       63.5 in Accession #:    [id number]          Weight:       152.1 lb Date of Birth:  [birth year]           BSA:          1.731 m Patient Age:    36 years            BP:           174/79 mmHg Patient Gender: F                   HR:           75 bpm. Exam Location:  [address] Procedure: 2D Echo, Cardiac Doppler and Color Doppler Indications:    C50.411 Breast cancer  History:        Patient has prior history of Echocardiogram examinations, most                 recent 08/13/2020. Breast cancer; Risk Factors:Hypertension.  Sonographer:    Coralyn Helling RDCS Referring Phys: [place]  Sonographer Comments: Technically difficult study due to poor echo windows. IMPRESSIONS  1. GLS not peformed on this study Prior study 08/13/20 was normal at -19.3. Left ventricular ejection fraction, by estimation, is 60 to 65%. The left ventricle has normal function. The left ventricle has no regional wall motion abnormalities. Left ventricular diastolic parameters were normal.  2. Right ventricular systolic function is normal. The right ventricular size is normal.  3. The mitral valve is normal in structure. No evidence of mitral valve regurgitation. No evidence of mitral stenosis.  4. The aortic valve is normal in structure. Aortic valve regurgitation is not visualized. No aortic stenosis is present.  5. The inferior vena cava is normal in size with greater than 50% respiratory variability, suggesting right atrial pressure of 3 mmHg. FINDINGS  Left Ventricle: GLS not peformed on this study Prior study 08/13/20 was normal at -19.3. Left ventricular ejection fraction, by estimation, is 60 to 65%. The left ventricle has normal function. The left ventricle has no  regional wall motion abnormalities. The left ventricular internal cavity size was normal in size. There is no left ventricular hypertrophy. Left ventricular diastolic parameters were normal. Right Ventricle: The right ventricular size is normal. No increase in right ventricular wall thickness. Right ventricular systolic function is normal. Left Atrium: Left atrial size was normal in size. Right Atrium: Right atrial size was normal in size. Pericardium: There is no evidence of pericardial effusion. Mitral Valve: The mitral valve is normal in structure. No evidence of mitral valve regurgitation. No evidence of mitral valve stenosis. Tricuspid Valve: The tricuspid valve is normal in structure. Tricuspid valve regurgitation is trivial. No evidence of tricuspid stenosis. Aortic Valve: The aortic valve is normal in structure. Aortic valve regurgitation is not visualized. No aortic stenosis is present. Pulmonic Valve: The pulmonic valve was normal in structure. Pulmonic valve regurgitation is not visualized. No evidence of pulmonic stenosis. Aorta: The aortic root is normal  in size and structure. Venous: The inferior vena cava is normal in size with greater than 50% respiratory variability, suggesting right atrial pressure of 3 mmHg. IAS/Shunts: No atrial level shunt detected by color flow Doppler.  LEFT VENTRICLE PLAX 2D LVIDd:         3.80 cm  Diastology LVIDs:         2.70 cm  LV e' medial:    7.51 cm/s LV PW:         1.10 cm  LV E/e' medial:  9.5 LV IVS:        1.10 cm  LV e' lateral:   5.87 cm/s LVOT diam:     1.70 cm  LV E/e' lateral: 12.2 LV SV:         47 LV SV Index:   27 LVOT Area:     2.27 cm  RIGHT VENTRICLE            IVC RVOT diam:      2.10 cm    IVC diam: 0.60 cm RVSP:           23.8 mmHg LEFT ATRIUM             Index       RIGHT ATRIUM           Index LA diam:        3.70 cm 2.14 cm/m  RA Pressure: 3.00 mmHg LA Vol (A2C):   48.2 ml 27.84 ml/m RA Area:     12.70 cm LA Vol (A4C):   36.3 ml 20.97 ml/m RA  Volume:   28.80 ml  16.63 ml/m LA Biplane Vol: 41.9 ml 24.20 ml/m  AORTIC VALVE LVOT Vmax:   97.90 cm/s LVOT Vmean:  63.200 cm/s LVOT VTI:    0.207 m  AORTA Ao Root diam: 3.20 cm Ao Asc diam:  2.70 cm MV E velocity: 71.70 cm/s  TRICUSPID VALVE MV A velocity: 82.50 cm/s  TR Peak grad:   20.8 mmHg MV E/A ratio:  0.87        TR Vmax:        228.00 cm/s                            Estimated RAP:  3.00 mmHg                            RVSP:           23.8 mmHg                             SHUNTS                            Systemic VTI:  0.21 m                            Systemic Diam: 1.70 cm                            Pulmonic Diam: 2.10 cm Jenkins Rouge MD Electronically signed by Jenkins Rouge MD Signature Date/Time: 12/10/2020/1:52:44 PM    Final      ELIGIBLE FOR AVAILABLE RESEARCH PROTOCOL: no   ASSESSMENT: 70 y.o. [employer] woman  RIGHT BREAST CANCER (0) status post right breast upper outer quadrant biopsy 06/20/2017 for a clinical T1b N0, stage IA invasive ductal carcinoma, grade 3, estrogen and progesterone receptor negative, but HER-2 amplified, with an MIB-1 of 30%  (1) genetics testing 07/05/2017 through the Common Hereditary Cancer Panel offered by [facility] found no deleterious mutations in APC, ATM, AXIN2, BARD1, BMPR1A, BRCA1, BRCA2, BRIP1, CDH1, CDKN2A (p14ARF), CDKN2A (p16INK4a), CKD4, CHEK2, CTNNA1, DICER1, EPCAM (Deletion/duplication testing only), GREM1 (promoter region deletion/duplication testing only), KIT, MEN1, MLH1, MSH2, MSH3, MSH6, MUTYH, NBN, NF1, NHTL1, PALB2, PDGFRA, PMS2, POLD1, POLE, PTEN, RAD50, RAD51C, RAD51D, SDHB, SDHC, SDHD, SMAD4, SMARCA4. STK11, TP53, TSC1, TSC2, and VHL.  The following genes were evaluated for sequence changes only: SDHA and HOXB13 c.251G>A variant only.  (a) a Variant of uncertain significance in MSH2 was identified c.1331G>T (p.Arg444Leu).   (2) status post right lumpectomy and sentinel lymph node sampling 07/17/2017 for a pT1c pN0,  stage IA invasive ductal carcinoma, grade 2, with negative margins.  A total of 5 lymph nodes were removed  (3) adjuvant chemotherapy consisting of paclitaxel weekly x12 together with trastuzumab every 21 days starting 08/07/2017  (a) paclitaxel stopped after 8 doses because of neuropathy (last dose 09/25/2017  (4) trastuzumab continued to total 1 year (last dose 07/23/2018)  (a) echo 08/10/2017 showed an ejection fraction in the 65-70% range  (b) echo on 11/01/2017 shows EF of 60-65%  (c) cardiogram 02/05/2018 shows an ejection fraction in the 60-65% range.  (d) echocardiogram 05/28/2018 shows an ejection fraction in the 65-70%.  (5) adjuvant radiation 10/17/2017-11/14/2016: 40.05 Gy directed to the Right Breast in 15 fractions, followed by a boost of 10 Gy given in 5 fractions   (6) anemia, with normal MCV, ferritin, B12, folate, and inappropriately normal reticulocyte count, consistent with anemia of chronic illness.  LEFT BREAST CANCER: (7) status post left breast upper inner quadrant biopsy 06/15/2020 for a clinical T1 N0 invasive ductal carcinoma, grade 3, weakly estrogen receptor positive, progesterone receptor negative, HER2 amplified, with an MIB-1 of 45%.  (8) status post left lumpectomy and sentinel lymph node sampling 07/13/2020 for a pT1c pN0, stage IB invasive ductal carcinoma, grade 3, with negative margins  (a) a total of 5 axilla lymph nodes were removed, all benign  (b) repeat prognostic panel again weakly estrogen receptor positive, progesterone receptor negative, now HER-2 not amplified  (9) adjuvant chemotherapy and anti-HER2 immunotherapy started 07/27/2020, consisting of CMF chemotherapy to be repeated every 21 days x 8, completed 12/28/2020, with trastuzumab to be continued for 1 year.  (a) echocardiogram 05/28/2020 shows an ejection fraction in the 65-70% range  (b) echo 08/13/2020 shows an ejection fraction in the 65 to 70% range  (c) cycle 4 of CMF delayed because of  neutropenia; full [place] added with subsequent cycles  (d) methotrexate omitted during radiation over 1 overlap  (10) trastuzumab to be continued to February 2023  (a) most recent echo 12/10/2020 shows an ejection fraction in the 60-65% range  (11) adjuvant radiation given concurrently with CMF and completed 10/19/2020  (12) to start letrozole at the completion of local treatment   PLAN: Akeela completes her CMF chemotherapy today.  She is very excited by this.  She has done a terrific job and has been able to keep her here.  There have been no unusual or significant side effects.  I have encouraged her to increase her exercise program and particularly the walking program.  She will continue trastuzumab every 21 days into February  2023.  She will need a repeat echo in September.  I am going to see her in October on a treatment day and before that she will have a bone density scan.  Based on that we will decide if she should be on tamoxifen or letrozole for breast cancer prevention.  Either drug is unlikely to have a significant effect on recurrence risk for either of her breast cancers which have been functionally estrogen receptor and progesterone receptor negative  She knows to call for any other issue that may develop before the next visit  Total encounter time 25 minutes.*  Jaleigha Deane, Virgie Dad, MD  12/28/20 11:13 AM Medical Oncology and Hematology [facility] [place], [place] [address] Tel. [phone]    Fax. [phone]   I, Wilburn Mylar, am acting as scribe for Dr. Virgie Dad. Suda Forbess.  I, Lurline Del MD, have reviewed the above documentation for accuracy and completeness, and I agree with the above.   *Total Encounter Time as defined by the Centers for Medicare and Medicaid Services includes, in addition to the face-to-face time of a patient visit (documented in the note above) non-face-to-face time: obtaining and reviewing outside  history, ordering and reviewing medications, tests or procedures, care coordination (communications with other health care professionals or caregivers) and documentation in the medical record.

## 2020-12-28 ENCOUNTER — Inpatient Hospital Stay: Payer: Medicare PPO | Admitting: Oncology

## 2020-12-28 ENCOUNTER — Inpatient Hospital Stay: Payer: Medicare PPO

## 2020-12-28 ENCOUNTER — Encounter: Payer: Self-pay | Admitting: Genetic Counselor

## 2020-12-28 ENCOUNTER — Other Ambulatory Visit: Payer: Self-pay

## 2020-12-28 ENCOUNTER — Encounter: Payer: Self-pay | Admitting: *Deleted

## 2020-12-28 ENCOUNTER — Inpatient Hospital Stay: Payer: Medicare PPO | Attending: Oncology

## 2020-12-28 VITALS — BP 158/63 | HR 66 | Temp 97.2°F | Resp 17 | Wt 152.8 lb

## 2020-12-28 DIAGNOSIS — C50012 Malignant neoplasm of nipple and areola, left female breast: Secondary | ICD-10-CM | POA: Diagnosis not present

## 2020-12-28 DIAGNOSIS — Z801 Family history of malignant neoplasm of trachea, bronchus and lung: Secondary | ICD-10-CM | POA: Insufficient documentation

## 2020-12-28 DIAGNOSIS — Z79899 Other long term (current) drug therapy: Secondary | ICD-10-CM | POA: Insufficient documentation

## 2020-12-28 DIAGNOSIS — Z8042 Family history of malignant neoplasm of prostate: Secondary | ICD-10-CM | POA: Insufficient documentation

## 2020-12-28 DIAGNOSIS — C50411 Malignant neoplasm of upper-outer quadrant of right female breast: Secondary | ICD-10-CM | POA: Insufficient documentation

## 2020-12-28 DIAGNOSIS — Z5111 Encounter for antineoplastic chemotherapy: Secondary | ICD-10-CM | POA: Insufficient documentation

## 2020-12-28 DIAGNOSIS — Z171 Estrogen receptor negative status [ER-]: Secondary | ICD-10-CM

## 2020-12-28 DIAGNOSIS — Z5189 Encounter for other specified aftercare: Secondary | ICD-10-CM | POA: Insufficient documentation

## 2020-12-28 DIAGNOSIS — Z8041 Family history of malignant neoplasm of ovary: Secondary | ICD-10-CM | POA: Diagnosis not present

## 2020-12-28 DIAGNOSIS — Z170 Estrogen receptor positive status [ER+]: Secondary | ICD-10-CM | POA: Insufficient documentation

## 2020-12-28 DIAGNOSIS — Z8249 Family history of ischemic heart disease and other diseases of the circulatory system: Secondary | ICD-10-CM | POA: Insufficient documentation

## 2020-12-28 DIAGNOSIS — I700 Atherosclerosis of aorta: Secondary | ICD-10-CM | POA: Diagnosis not present

## 2020-12-28 DIAGNOSIS — C50212 Malignant neoplasm of upper-inner quadrant of left female breast: Secondary | ICD-10-CM | POA: Insufficient documentation

## 2020-12-28 DIAGNOSIS — Z5112 Encounter for antineoplastic immunotherapy: Secondary | ICD-10-CM | POA: Insufficient documentation

## 2020-12-28 DIAGNOSIS — Z803 Family history of malignant neoplasm of breast: Secondary | ICD-10-CM | POA: Insufficient documentation

## 2020-12-28 DIAGNOSIS — I10 Essential (primary) hypertension: Secondary | ICD-10-CM | POA: Insufficient documentation

## 2020-12-28 DIAGNOSIS — Z95828 Presence of other vascular implants and grafts: Secondary | ICD-10-CM

## 2020-12-28 LAB — CMP (CANCER CENTER ONLY)
ALT: 27 U/L (ref 0–44)
AST: 24 U/L (ref 15–41)
Albumin: 3.7 g/dL (ref 3.5–5.0)
Alkaline Phosphatase: 80 U/L (ref 38–126)
Anion gap: 6 (ref 5–15)
BUN: 16 mg/dL (ref 8–23)
CO2: 29 mmol/L (ref 22–32)
Calcium: 9.9 mg/dL (ref 8.9–10.3)
Chloride: 106 mmol/L (ref 98–111)
Creatinine: 0.97 mg/dL (ref 0.44–1.00)
GFR, Estimated: >60 mL/min (ref >60)
Glucose, Bld: 102 mg/dL — ABNORMAL HIGH (ref 70–99)
Potassium: 3.8 mmol/L (ref 3.5–5.1)
Sodium: 141 mmol/L (ref 135–145)
Total Bilirubin: 0.2 mg/dL — ABNORMAL LOW (ref 0.3–1.2)
Total Protein: 7.2 g/dL (ref 6.5–8.1)

## 2020-12-28 LAB — CBC WITH DIFFERENTIAL (CANCER CENTER ONLY)
Abs Immature Granulocytes: 0.01 10*3/uL (ref 0.00–0.07)
Basophils Absolute: 0 10*3/uL (ref 0.0–0.1)
Basophils Relative: 1 %
Eosinophils Absolute: 0.2 10*3/uL (ref 0.0–0.5)
Eosinophils Relative: 6 %
HCT: 30.7 % — ABNORMAL LOW (ref 36.0–46.0)
Hemoglobin: 10.3 g/dL — ABNORMAL LOW (ref 12.0–15.0)
Immature Granulocytes: 0 %
Lymphocytes Relative: 26 %
Lymphs Abs: 0.8 10*3/uL (ref 0.7–4.0)
MCH: 32 pg (ref 26.0–34.0)
MCHC: 33.6 g/dL (ref 30.0–36.0)
MCV: 95.3 fL (ref 80.0–100.0)
Monocytes Absolute: 0.5 10*3/uL (ref 0.1–1.0)
Monocytes Relative: 16 %
Neutro Abs: 1.5 10*3/uL — ABNORMAL LOW (ref 1.7–7.7)
Neutrophils Relative %: 51 %
Platelet Count: 183 10*3/uL (ref 150–400)
RBC: 3.22 MIL/uL — ABNORMAL LOW (ref 3.87–5.11)
RDW: 14.4 % (ref 11.5–15.5)
WBC Count: 2.9 10*3/uL — ABNORMAL LOW (ref 4.0–10.5)
nRBC: 0 % (ref 0.0–0.2)

## 2020-12-28 MED ORDER — DIPHENHYDRAMINE HCL 25 MG PO CAPS
ORAL_CAPSULE | ORAL | Status: AC
  Filled 2020-12-28: qty 1

## 2020-12-28 MED ORDER — DIPHENHYDRAMINE HCL 25 MG PO CAPS
25.0000 mg | ORAL_CAPSULE | Freq: Once | ORAL | Status: AC
Start: 2020-12-28 — End: 2020-12-28
  Administered 2020-12-28: 25 mg via ORAL

## 2020-12-28 MED ORDER — TRASTUZUMAB-ANNS CHEMO 150 MG IV SOLR
6.0000 mg/kg | Freq: Once | INTRAVENOUS | Status: AC
  Administered 2020-12-28: 420 mg via INTRAVENOUS
  Filled 2020-12-28: qty 20

## 2020-12-28 MED ORDER — ACETAMINOPHEN 325 MG PO TABS
ORAL_TABLET | ORAL | Status: AC
  Filled 2020-12-28: qty 2

## 2020-12-28 MED ORDER — SODIUM CHLORIDE 0.9 % IV SOLN
Freq: Once | INTRAVENOUS | Status: AC
Start: 2020-12-28 — End: 2020-12-28
  Filled 2020-12-28: qty 250

## 2020-12-28 MED ORDER — SODIUM CHLORIDE 0.9 % IV SOLN
10.0000 mg | Freq: Once | INTRAVENOUS | Status: AC
Start: 2020-12-28 — End: 2020-12-28
  Administered 2020-12-28: 10 mg via INTRAVENOUS
  Filled 2020-12-28: qty 10

## 2020-12-28 MED ORDER — PALONOSETRON HCL INJECTION 0.25 MG/5ML
INTRAVENOUS | Status: AC
  Filled 2020-12-28: qty 5

## 2020-12-28 MED ORDER — ACETAMINOPHEN 325 MG PO TABS
650.0000 mg | ORAL_TABLET | Freq: Once | ORAL | Status: AC
  Administered 2020-12-28: 650 mg via ORAL

## 2020-12-28 MED ORDER — SODIUM CHLORIDE 0.9 % IV SOLN
600.0000 mg/m2 | Freq: Once | INTRAVENOUS | Status: AC
  Administered 2020-12-28: 1060 mg via INTRAVENOUS
  Filled 2020-12-28: qty 53

## 2020-12-28 MED ORDER — SODIUM CHLORIDE 0.9 % IV SOLN
Freq: Once | INTRAVENOUS | Status: AC
  Filled 2020-12-28: qty 250

## 2020-12-28 MED ORDER — METHOTREXATE SODIUM (PF) CHEMO INJECTION 250 MG/10ML
40.0000 mg/m2 | Freq: Once | INTRAMUSCULAR | Status: AC
Start: 2020-12-28 — End: 2020-12-28
  Administered 2020-12-28: 70 mg via INTRAVENOUS
  Filled 2020-12-28: qty 2.8

## 2020-12-28 MED ORDER — SODIUM CHLORIDE 0.9% FLUSH
10.0000 mL | Freq: Once | INTRAVENOUS | Status: AC
  Administered 2020-12-28: 10 mL
  Filled 2020-12-28: qty 10

## 2020-12-28 MED ORDER — PALONOSETRON HCL INJECTION 0.25 MG/5ML
0.2500 mg | Freq: Once | INTRAVENOUS | Status: AC
Start: 2020-12-28 — End: 2020-12-28
  Administered 2020-12-28: 0.25 mg via INTRAVENOUS

## 2020-12-28 MED ORDER — HEPARIN SOD (PORK) LOCK FLUSH 100 UNIT/ML IV SOLN
500.0000 [IU] | Freq: Once | INTRAVENOUS | Status: AC | PRN
  Administered 2020-12-28: 500 [IU]
  Filled 2020-12-28: qty 5

## 2020-12-28 MED ORDER — SODIUM CHLORIDE 0.9% FLUSH
10.0000 mL | INTRAVENOUS | Status: DC | PRN
  Administered 2020-12-28: 10 mL
  Filled 2020-12-28: qty 10

## 2020-12-28 MED ORDER — FLUOROURACIL CHEMO INJECTION 2.5 GM/50ML
600.0000 mg/m2 | Freq: Once | INTRAVENOUS | Status: AC
  Administered 2020-12-28: 1050 mg via INTRAVENOUS
  Filled 2020-12-28: qty 21

## 2020-12-28 NOTE — Progress Notes (Signed)
Blood return noted before, during, and after IV Methotrexate and Fluorouracil injections.

## 2020-12-28 NOTE — Patient Instructions (Signed)
[facility]  Discharge Instructions: Thank you for choosing [facility] to provide your oncology and hematology care.   If you have a lab appointment with the [facility], please go directly to the [facility] and check in at the registration area.   Wear comfortable clothing and clothing appropriate for easy access to any Portacath or PICC line.   We strive to give you quality time with your provider. You may need to reschedule your appointment if you arrive late (15 or more minutes).  Arriving late affects you and other patients whose appointments are after yours.  Also, if you miss three or more appointments without notifying the office, you may be dismissed from the clinic at the provider's discretion.      For prescription refill requests, have your pharmacy contact our office and allow 72 hours for refills to be completed.    Today you received the following chemotherapy and/or immunotherapy agents: Trastuzumab (Kanjinti), Methotrexate, Cyclophosphamide (Cytoxan), and Fluorouracil   To help prevent nausea and vomiting after your treatment, we encourage you to take your nausea medication as directed.  BELOW ARE SYMPTOMS THAT SHOULD BE REPORTED IMMEDIATELY: *FEVER GREATER THAN 100.4 F (38 C) OR HIGHER *CHILLS OR SWEATING *NAUSEA AND VOMITING THAT IS NOT CONTROLLED WITH YOUR NAUSEA MEDICATION *UNUSUAL SHORTNESS OF BREATH *UNUSUAL BRUISING OR BLEEDING *URINARY PROBLEMS (pain or burning when urinating, or frequent urination) *BOWEL PROBLEMS (unusual diarrhea, constipation, pain near the anus) TENDERNESS IN MOUTH AND THROAT WITH OR WITHOUT PRESENCE OF ULCERS (sore throat, sores in mouth, or a toothache) UNUSUAL RASH, SWELLING OR PAIN  UNUSUAL VAGINAL DISCHARGE OR ITCHING   Items with * indicate a potential emergency and should be followed up as soon as possible or go to the Emergency Department if any problems should occur.  Please show  the CHEMOTHERAPY ALERT CARD or IMMUNOTHERAPY ALERT CARD at check-in to the Emergency Department and triage nurse.  Should you have questions after your visit or need to cancel or reschedule your appointment, please contact [facility]  Dept: [phone]  and follow the prompts.  Office hours are 8:00 a.m. to 4:30 p.m. Monday - Friday. Please note that voicemails left after 4:00 p.m. may not be returned until the following business day.  We are closed weekends and major holidays. You have access to a nurse at all times for urgent questions. Please call the main number to the clinic Dept: [phone] and follow the prompts.   For any non-urgent questions, you may also contact your provider using MyChart. We now offer e-Visits for anyone 66 and older to request care online for non-urgent symptoms. For details visit [email or web address].   Also download the MyChart app! Go to the app store, search "MyChart", open the app, select [facility], and log in with your MyChart username and password.  Due to Covid, a mask is required upon entering the hospital/clinic. If you do not have a mask, one will be given to you upon arrival. For doctor visits, patients may have 1 support person aged 32 or older with them. For treatment visits, patients cannot have anyone with them due to current Covid guidelines and our immunocompromised population.

## 2020-12-28 NOTE — Progress Notes (Signed)
UPDATE:MSH2 [id number] ([id number]) has been reclassified to Benign.  The amended report date is December 27, 2020.

## 2020-12-28 NOTE — Patient Instructions (Signed)

## 2020-12-30 ENCOUNTER — Inpatient Hospital Stay: Payer: Medicare PPO

## 2020-12-30 ENCOUNTER — Other Ambulatory Visit: Payer: Self-pay

## 2020-12-30 VITALS — BP 151/69 | HR 57 | Temp 98.6°F | Resp 16

## 2020-12-30 DIAGNOSIS — Z5111 Encounter for antineoplastic chemotherapy: Secondary | ICD-10-CM | POA: Diagnosis not present

## 2020-12-30 DIAGNOSIS — Z5189 Encounter for other specified aftercare: Secondary | ICD-10-CM | POA: Diagnosis not present

## 2020-12-30 DIAGNOSIS — Z171 Estrogen receptor negative status [ER-]: Secondary | ICD-10-CM | POA: Diagnosis not present

## 2020-12-30 DIAGNOSIS — C50212 Malignant neoplasm of upper-inner quadrant of left female breast: Secondary | ICD-10-CM | POA: Diagnosis not present

## 2020-12-30 DIAGNOSIS — Z5112 Encounter for antineoplastic immunotherapy: Secondary | ICD-10-CM | POA: Diagnosis not present

## 2020-12-30 DIAGNOSIS — Z170 Estrogen receptor positive status [ER+]: Secondary | ICD-10-CM | POA: Diagnosis not present

## 2020-12-30 DIAGNOSIS — I700 Atherosclerosis of aorta: Secondary | ICD-10-CM | POA: Diagnosis not present

## 2020-12-30 DIAGNOSIS — C50411 Malignant neoplasm of upper-outer quadrant of right female breast: Secondary | ICD-10-CM | POA: Diagnosis not present

## 2020-12-30 DIAGNOSIS — I10 Essential (primary) hypertension: Secondary | ICD-10-CM | POA: Diagnosis not present

## 2020-12-30 MED ORDER — PEGFILGRASTIM-JMDB 6 MG/0.6ML ~~LOC~~ SOSY
PREFILLED_SYRINGE | SUBCUTANEOUS | Status: AC
  Filled 2020-12-30: qty 0.6

## 2020-12-30 MED ORDER — PEGFILGRASTIM-JMDB 6 MG/0.6ML ~~LOC~~ SOSY
6.0000 mg | PREFILLED_SYRINGE | Freq: Once | SUBCUTANEOUS | Status: AC
  Administered 2020-12-30: 6 mg via SUBCUTANEOUS

## 2020-12-30 NOTE — Patient Instructions (Signed)

## 2021-01-10 ENCOUNTER — Ambulatory Visit: Payer: Medicare PPO

## 2021-01-10 ENCOUNTER — Encounter: Payer: Self-pay | Admitting: Oncology

## 2021-01-10 ENCOUNTER — Encounter: Payer: Self-pay | Admitting: *Deleted

## 2021-01-10 ENCOUNTER — Telehealth: Payer: Self-pay | Admitting: *Deleted

## 2021-01-10 NOTE — Telephone Encounter (Signed)
Pt states insurance will not cover bone density scan since it has not been 2 years since last one. Dr Jana Hakim notified

## 2021-01-10 NOTE — Progress Notes (Signed)
                                                                                                                                                             Patient Name: April [name] MRN: [id number] DOB: Sep 06, 1950 Referring Physician: Lurline Del (Profile Not Attached) Date of Service: 10/20/2020 [facility], [place]                                                        End Of Treatment Note  Diagnoses: C50.212-Malignant neoplasm of upper-inner quadrant of left female breast  Cancer Staging: Cancer Staging Malignant neoplasm of left breast, estrogen receptor positive ([facility]) Staging form: Breast, AJCC 8th Edition - Pathologic stage from 07/13/2020: Stage IA (pT1c, pN0, cM0, G3, ER+, PR-, HER2+) - Signed by Gardenia Phlegm, NP on 07/28/2020 Stage prefix: Initial diagnosis Histologic grading system: 3 grade system  Malignant neoplasm of upper-outer quadrant of right breast in female, estrogen receptor negative () Staging form: Breast, AJCC 8th Edition - Clinical stage from 06/27/2017: Stage IA (cT1b, cN0, cM0, G3, ER-, PR-, HER2+) - Signed by Eppie Gibson, MD on 06/27/2017 Histologic grading system: 3 grade system Laterality: Right Stage used in treatment planning: Yes National guidelines used in treatment planning: Yes Type of national guideline used in treatment planning: NCCN Staging comments: Staged at breast conference on 1.9.19 - Pathologic: Stage IA (pT1c, pN0, cM0, G2, ER-, PR-, HER2+) - Unsigned Histologic grading system: 3 grade system   Intent: Curative  Radiation Treatment Dates: 09/09/2020 through 10/20/2020 Site Technique Total Dose (Gy) Dose per Fx (Gy) Completed Fx Beam Energies  Breast, Left: Breast_Lt 3D 50/50 2 25/25 6X, 10X  Breast, Left: Breast_Lt_Bst 3D 10/10 2 5/5 6X, 10X   Narrative: The patient tolerated radiation therapy relatively well.   Plan: The patient will follow-up with radiation oncology in [place] as  needed. -----------------------------------  SEppie Gibson MD

## 2021-01-18 ENCOUNTER — Other Ambulatory Visit: Payer: Self-pay

## 2021-01-18 ENCOUNTER — Inpatient Hospital Stay: Payer: Medicare PPO

## 2021-01-18 ENCOUNTER — Inpatient Hospital Stay: Payer: Medicare PPO | Attending: Oncology

## 2021-01-18 ENCOUNTER — Other Ambulatory Visit: Payer: Medicare PPO

## 2021-01-18 ENCOUNTER — Ambulatory Visit: Payer: Medicare PPO

## 2021-01-18 VITALS — BP 146/89 | HR 62 | Temp 98.4°F | Resp 16 | Wt 150.8 lb

## 2021-01-18 DIAGNOSIS — Z5111 Encounter for antineoplastic chemotherapy: Secondary | ICD-10-CM | POA: Diagnosis present

## 2021-01-18 DIAGNOSIS — C50411 Malignant neoplasm of upper-outer quadrant of right female breast: Secondary | ICD-10-CM | POA: Insufficient documentation

## 2021-01-18 DIAGNOSIS — Z5112 Encounter for antineoplastic immunotherapy: Secondary | ICD-10-CM | POA: Diagnosis not present

## 2021-01-18 DIAGNOSIS — Z171 Estrogen receptor negative status [ER-]: Secondary | ICD-10-CM | POA: Diagnosis not present

## 2021-01-18 LAB — CBC WITH DIFFERENTIAL (CANCER CENTER ONLY)
Abs Immature Granulocytes: 0.01 10*3/uL (ref 0.00–0.07)
Basophils Absolute: 0 10*3/uL (ref 0.0–0.1)
Basophils Relative: 1 %
Eosinophils Absolute: 0.2 10*3/uL (ref 0.0–0.5)
Eosinophils Relative: 6 %
HCT: 30.2 % — ABNORMAL LOW (ref 36.0–46.0)
Hemoglobin: 10.4 g/dL — ABNORMAL LOW (ref 12.0–15.0)
Immature Granulocytes: 0 %
Lymphocytes Relative: 29 %
Lymphs Abs: 0.8 10*3/uL (ref 0.7–4.0)
MCH: 32.5 pg (ref 26.0–34.0)
MCHC: 34.4 g/dL (ref 30.0–36.0)
MCV: 94.4 fL (ref 80.0–100.0)
Monocytes Absolute: 0.4 10*3/uL (ref 0.1–1.0)
Monocytes Relative: 15 %
Neutro Abs: 1.4 10*3/uL — ABNORMAL LOW (ref 1.7–7.7)
Neutrophils Relative %: 49 %
Platelet Count: 174 10*3/uL (ref 150–400)
RBC: 3.2 MIL/uL — ABNORMAL LOW (ref 3.87–5.11)
RDW: 14 % (ref 11.5–15.5)
WBC Count: 2.8 10*3/uL — ABNORMAL LOW (ref 4.0–10.5)
nRBC: 0 % (ref 0.0–0.2)

## 2021-01-18 LAB — CMP (CANCER CENTER ONLY)
ALT: 36 U/L (ref 0–44)
AST: 29 U/L (ref 15–41)
Albumin: 3.9 g/dL (ref 3.5–5.0)
Alkaline Phosphatase: 86 U/L (ref 38–126)
Anion gap: 8 (ref 5–15)
BUN: 17 mg/dL (ref 8–23)
CO2: 29 mmol/L (ref 22–32)
Calcium: 10 mg/dL (ref 8.9–10.3)
Chloride: 103 mmol/L (ref 98–111)
Creatinine: 1.05 mg/dL — ABNORMAL HIGH (ref 0.44–1.00)
GFR, Estimated: 57 mL/min — ABNORMAL LOW (ref >60)
Glucose, Bld: 103 mg/dL — ABNORMAL HIGH (ref 70–99)
Potassium: 3.7 mmol/L (ref 3.5–5.1)
Sodium: 140 mmol/L (ref 135–145)
Total Bilirubin: 0.4 mg/dL (ref 0.3–1.2)
Total Protein: 7.4 g/dL (ref 6.5–8.1)

## 2021-01-18 MED ORDER — DIPHENHYDRAMINE HCL 25 MG PO CAPS
25.0000 mg | ORAL_CAPSULE | Freq: Once | ORAL | Status: AC
  Administered 2021-01-18: 25 mg via ORAL

## 2021-01-18 MED ORDER — DIPHENHYDRAMINE HCL 25 MG PO CAPS
ORAL_CAPSULE | ORAL | Status: AC
  Filled 2021-01-18: qty 1

## 2021-01-18 MED ORDER — SODIUM CHLORIDE 0.9 % IV SOLN
Freq: Once | INTRAVENOUS | Status: AC
  Filled 2021-01-18: qty 250

## 2021-01-18 MED ORDER — HEPARIN SOD (PORK) LOCK FLUSH 100 UNIT/ML IV SOLN
500.0000 [IU] | Freq: Once | INTRAVENOUS | Status: AC | PRN
  Administered 2021-01-18: 500 [IU]
  Filled 2021-01-18: qty 5

## 2021-01-18 MED ORDER — SODIUM CHLORIDE 0.9% FLUSH
10.0000 mL | INTRAVENOUS | Status: DC | PRN
  Administered 2021-01-18: 10 mL
  Filled 2021-01-18: qty 10

## 2021-01-18 MED ORDER — TRASTUZUMAB-ANNS CHEMO 150 MG IV SOLR
6.0000 mg/kg | Freq: Once | INTRAVENOUS | Status: AC
  Administered 2021-01-18: 420 mg via INTRAVENOUS
  Filled 2021-01-18: qty 20

## 2021-01-18 MED ORDER — ACETAMINOPHEN 325 MG PO TABS
650.0000 mg | ORAL_TABLET | Freq: Once | ORAL | Status: AC
  Administered 2021-01-18: 650 mg via ORAL

## 2021-01-18 MED ORDER — ACETAMINOPHEN 325 MG PO TABS
ORAL_TABLET | ORAL | Status: AC
  Filled 2021-01-18: qty 2

## 2021-01-18 NOTE — Patient Instructions (Signed)
[facility]  Discharge Instructions: Thank you for choosing [facility] to provide your oncology and hematology care.   If you have a lab appointment with the [facility], please go directly to the [facility] and check in at the registration area.   Wear comfortable clothing and clothing appropriate for easy access to any Portacath or PICC line.   We strive to give you quality time with your provider. You may need to reschedule your appointment if you arrive late (15 or more minutes).  Arriving late affects you and other patients whose appointments are after yours.  Also, if you miss three or more appointments without notifying the office, you may be dismissed from the clinic at the provider's discretion.      For prescription refill requests, have your pharmacy contact our office and allow 72 hours for refills to be completed.    Today you received the following chemotherapy and/or immunotherapy agents: Trastuzumab (Kanjinti)   To help prevent nausea and vomiting after your treatment, we encourage you to take your nausea medication as directed.  BELOW ARE SYMPTOMS THAT SHOULD BE REPORTED IMMEDIATELY: *FEVER GREATER THAN 100.4 F (38 C) OR HIGHER *CHILLS OR SWEATING *NAUSEA AND VOMITING THAT IS NOT CONTROLLED WITH YOUR NAUSEA MEDICATION *UNUSUAL SHORTNESS OF BREATH *UNUSUAL BRUISING OR BLEEDING *URINARY PROBLEMS (pain or burning when urinating, or frequent urination) *BOWEL PROBLEMS (unusual diarrhea, constipation, pain near the anus) TENDERNESS IN MOUTH AND THROAT WITH OR WITHOUT PRESENCE OF ULCERS (sore throat, sores in mouth, or a toothache) UNUSUAL RASH, SWELLING OR PAIN  UNUSUAL VAGINAL DISCHARGE OR ITCHING   Items with * indicate a potential emergency and should be followed up as soon as possible or go to the Emergency Department if any problems should occur.  Please show the CHEMOTHERAPY ALERT CARD or IMMUNOTHERAPY ALERT CARD at  check-in to the Emergency Department and triage nurse.  Should you have questions after your visit or need to cancel or reschedule your appointment, please contact [facility]  Dept: [phone]  and follow the prompts.  Office hours are 8:00 a.m. to 4:30 p.m. Monday - Friday. Please note that voicemails left after 4:00 p.m. may not be returned until the following business day.  We are closed weekends and major holidays. You have access to a nurse at all times for urgent questions. Please call the main number to the clinic Dept: [phone] and follow the prompts.   For any non-urgent questions, you may also contact your provider using MyChart. We now offer e-Visits for anyone 36 and older to request care online for non-urgent symptoms. For details visit [email or web address].   Also download the MyChart app! Go to the app store, search "MyChart", open the app, select [facility], and log in with your MyChart username and password.  Due to Covid, a mask is required upon entering the hospital/clinic. If you do not have a mask, one will be given to you upon arrival. For doctor visits, patients may have 1 support person aged 53 or older with them. For treatment visits, patients cannot have anyone with them due to current Covid guidelines and our immunocompromised population.

## 2021-01-24 ENCOUNTER — Other Ambulatory Visit: Payer: Self-pay

## 2021-01-24 ENCOUNTER — Ambulatory Visit: Payer: Medicare PPO | Attending: Oncology

## 2021-01-24 VITALS — Wt 150.2 lb

## 2021-01-24 DIAGNOSIS — R293 Abnormal posture: Secondary | ICD-10-CM | POA: Insufficient documentation

## 2021-01-24 DIAGNOSIS — Z483 Aftercare following surgery for neoplasm: Secondary | ICD-10-CM | POA: Insufficient documentation

## 2021-01-24 DIAGNOSIS — C50412 Malignant neoplasm of upper-outer quadrant of left female breast: Secondary | ICD-10-CM | POA: Insufficient documentation

## 2021-01-24 DIAGNOSIS — R609 Edema, unspecified: Secondary | ICD-10-CM | POA: Insufficient documentation

## 2021-01-24 NOTE — Therapy (Signed)
[place], [place], [address] Phone: [phone]   Fax:  [phone]  Physical Therapy Treatment  Patient Details  Name: April [name] MRN: [id number] Date of Birth: 12-24-1950 Referring Provider (PT): Dr. Donne Hazel   Encounter Date: 01/24/2021   PT End of Session - 01/24/21 1156     Visit Number 11   # unchanged due to screen only   PT Start Time 1149    PT Stop Time 1156    PT Time Calculation (min) 7 min    Activity Tolerance Patient tolerated treatment well    Behavior During Therapy [facility] for tasks assessed/performed             Past Medical History:  Diagnosis Date   Breast cancer ([facility]) 05/2017   right breast   Eczema    Family history of breast cancer    Family history of ovarian cancer    History of radiation therapy 10/17/17- 11/14/17   40.05 directed to the right breast in 15 fractions, followed by a boost of 10 gy given in 5 fractions.    Hypertension    toxemia during pregnancy, no meds now    Past Surgical History:  Procedure Laterality Date   ABDOMINAL HYSTERECTOMY     BREAST LUMPECTOMY WITH RADIOACTIVE SEED AND SENTINEL LYMPH NODE BIOPSY Right 07/17/2017   Procedure: BREAST LUMPECTOMY WITH RADIOACTIVE SEED AND SENTINEL LYMPH NODE BIOPSY;  Surgeon: Rolm Bookbinder, MD;  Location: [facility];  Service: General;  Laterality: Right;   BREAST LUMPECTOMY WITH RADIOACTIVE SEED AND SENTINEL LYMPH NODE BIOPSY Left 07/13/2020   Procedure: LEFT BREAST LUMPECTOMY WITH RADIOACTIVE SEED AND LEFT AXILLARY SENTINEL LYMPH NODE BIOPSY;  Surgeon: Rolm Bookbinder, MD;  Location: [facility];  Service: General;  Laterality: Left;   CESAREAN SECTION     x4   DILATION AND CURETTAGE OF UTERUS     KNEE SURGERY Right    PORT-A-CATH REMOVAL Right 09/04/2018   Procedure: REMOVAL PORT-A-CATH;  Surgeon: Rolm Bookbinder, MD;  Location: [facility];   Service: General;  Laterality: Right;   PORTACATH PLACEMENT N/A 07/17/2017   Procedure: INSERTION PORT-A-CATH WITH [place];  Surgeon: Rolm Bookbinder, MD;  Location: [facility];  Service: General;  Laterality: N/A;   PORTACATH PLACEMENT Right 07/13/2020   Procedure: INSERTION PORT-A-CATH WITH ULTRASOUND GUIDANCE;  Surgeon: Rolm Bookbinder, MD;  Location: [facility];  Service: General;  Laterality: Right;    Vitals:   01/24/21 1149  Weight: 150 lb 4 oz (68.2 kg)     Subjective Assessment - 01/24/21 1150     Subjective Pt returns for her 3 month L-Dex screen.    Pertinent History Pt with prior history of right breast cancer  with  with surgery on 07/17/17 for right lumpectomy and SLNB with 5 LN removed.  Had chemo and radiation. Most recent surgery1/25/22 for left breast lumpectomy with 0/5 positive nodes. She will have chemo, radiation and infusions for immunotherapy.                    L-DEX FLOWSHEETS - 01/24/21 1100       L-DEX LYMPHEDEMA SCREENING   Measurement Type Bilateral    L-DEX MEASUREMENT EXTREMITY Upper Extremity    POSITION  Standing    DOMINANT SIDE Right    At Risk Side Left    BASELINE RIGHT 1.1    BASELINE LEFT -0.2    RIGHT SIDE SCORE  0.1    LEFT SIDE SCORE -0.5    VALUE CHANGE RIGHT -1    VALUE CHANGE LEFT -0.3                                    PT Long Term Goals - 10/26/20 1127       PT LONG TERM GOAL #1   Title Patient will demonstrate she has returned to baseline since surgery related to shoulder ROM and function.    Time 4    Period Weeks    Status Achieved      PT LONG TERM GOAL #2   Title Pt will be independent in left breast MLD    Time 3    Period Weeks    Status Achieved      PT LONG TERM GOAL #3   Title Pt will report decreased breast heaviness by 50% or greater    Time 4    Period Weeks    Status Achieved      PT LONG TERM GOAL #4   Title Pt will be independent  with scar massage    Period Weeks    Status Achieved      PT LONG TERM GOAL #5   Title Pt will be fit for compression sleeve and will be independent in its use    Period Weeks    Status Achieved      PT LONG TERM GOAL #6   Title Pt will maintain left shoulder ROM throughout radiation    Time 6    Period Weeks    Status Achieved                   Plan - 01/24/21 1157     Clinical Impression Statement Pt returns foe her 3 month L-Dex screen. Her changes from baseline of -0.3 with Lt and -1 with Rt are WNLs so no further treatment is required at this time except to cont every 3 month L-Dex screens which pt is agreeable to.    PT Next Visit Plan Cont every 3 month L-Dex screens for up to 2 years from her SLNB.    Consulted and Agree with Plan of Care Patient             Patient will benefit from skilled therapeutic intervention in order to improve the following deficits and impairments:     Visit Diagnosis: Aftercare following surgery for neoplasm     Problem List Patient Active Problem List   Diagnosis Date Noted   Age-related osteoporosis without current pathological fracture 09/08/2020   Allergic rhinitis 09/08/2020   Palpitations 09/08/2020   Personal history of malignant neoplasm of breast 09/08/2020   Personal history of transient ischemic attack (TIA), and cerebral infarction without residual deficits 09/08/2020   Pure hypercholesterolemia 09/08/2020   Vitamin D deficiency 09/08/2020   Neuropathy due to chemotherapeutic drug ([facility]) 07/23/2018   Port-A-Cath in place 08/28/2017   TIA (transient ischemic attack) 08/09/2017   Hypertension    Genetic testing 07/06/2017   Family history of ovarian cancer    Family history of breast cancer    Malignant neoplasm of upper-outer quadrant of right breast in female, estrogen receptor negative ([facility]) 06/26/2017   Malignant neoplasm of left breast, estrogen receptor positive ([facility]) 05/19/2017    Otelia Limes, PTA 01/24/2021, 12:05 PM  [place],  [place], [address] Phone: [phone]   Fax:  [phone]  Name: April [name] MRN: [id number] Date of Birth: [birth year]

## 2021-02-08 ENCOUNTER — Inpatient Hospital Stay: Payer: Medicare PPO

## 2021-02-08 ENCOUNTER — Other Ambulatory Visit: Payer: Self-pay

## 2021-02-08 ENCOUNTER — Other Ambulatory Visit: Payer: Medicare PPO

## 2021-02-08 ENCOUNTER — Other Ambulatory Visit: Payer: Self-pay | Admitting: *Deleted

## 2021-02-08 VITALS — BP 152/68 | HR 69 | Temp 98.4°F | Resp 18 | Wt 154.0 lb

## 2021-02-08 DIAGNOSIS — Z95828 Presence of other vascular implants and grafts: Secondary | ICD-10-CM

## 2021-02-08 DIAGNOSIS — Z171 Estrogen receptor negative status [ER-]: Secondary | ICD-10-CM

## 2021-02-08 DIAGNOSIS — C50411 Malignant neoplasm of upper-outer quadrant of right female breast: Secondary | ICD-10-CM | POA: Diagnosis not present

## 2021-02-08 DIAGNOSIS — Z5112 Encounter for antineoplastic immunotherapy: Secondary | ICD-10-CM | POA: Diagnosis not present

## 2021-02-08 LAB — CBC WITH DIFFERENTIAL (CANCER CENTER ONLY)
Abs Immature Granulocytes: 0 10*3/uL (ref 0.00–0.07)
Basophils Absolute: 0 10*3/uL (ref 0.0–0.1)
Basophils Relative: 1 %
Eosinophils Absolute: 0.2 10*3/uL (ref 0.0–0.5)
Eosinophils Relative: 5 %
HCT: 28.7 % — ABNORMAL LOW (ref 36.0–46.0)
Hemoglobin: 9.8 g/dL — ABNORMAL LOW (ref 12.0–15.0)
Immature Granulocytes: 0 %
Lymphocytes Relative: 27 %
Lymphs Abs: 0.9 10*3/uL (ref 0.7–4.0)
MCH: 32.2 pg (ref 26.0–34.0)
MCHC: 34.1 g/dL (ref 30.0–36.0)
MCV: 94.4 fL (ref 80.0–100.0)
Monocytes Absolute: 0.4 10*3/uL (ref 0.1–1.0)
Monocytes Relative: 14 %
Neutro Abs: 1.7 10*3/uL (ref 1.7–7.7)
Neutrophils Relative %: 53 %
Platelet Count: 197 10*3/uL (ref 150–400)
RBC: 3.04 MIL/uL — ABNORMAL LOW (ref 3.87–5.11)
RDW: 12.8 % (ref 11.5–15.5)
WBC Count: 3.2 10*3/uL — ABNORMAL LOW (ref 4.0–10.5)
nRBC: 0 % (ref 0.0–0.2)

## 2021-02-08 LAB — CMP (CANCER CENTER ONLY)
ALT: 28 U/L (ref 0–44)
AST: 31 U/L (ref 15–41)
Albumin: 3.5 g/dL (ref 3.5–5.0)
Alkaline Phosphatase: 55 U/L (ref 38–126)
Anion gap: 10 (ref 5–15)
BUN: 9 mg/dL (ref 8–23)
CO2: 26 mmol/L (ref 22–32)
Calcium: 9.3 mg/dL (ref 8.9–10.3)
Chloride: 106 mmol/L (ref 98–111)
Creatinine: 0.94 mg/dL (ref 0.44–1.00)
GFR, Estimated: >60 mL/min (ref >60)
Glucose, Bld: 109 mg/dL — ABNORMAL HIGH (ref 70–99)
Potassium: 3.3 mmol/L — ABNORMAL LOW (ref 3.5–5.1)
Sodium: 142 mmol/L (ref 135–145)
Total Bilirubin: 0.3 mg/dL (ref 0.3–1.2)
Total Protein: 6.9 g/dL (ref 6.5–8.1)

## 2021-02-08 MED ORDER — ACETAMINOPHEN 325 MG PO TABS
650.0000 mg | ORAL_TABLET | Freq: Once | ORAL | Status: AC
  Administered 2021-02-08: 650 mg via ORAL
  Filled 2021-02-08: qty 2

## 2021-02-08 MED ORDER — SODIUM CHLORIDE 0.9% FLUSH
10.0000 mL | INTRAVENOUS | Status: DC | PRN
  Administered 2021-02-08: 10 mL

## 2021-02-08 MED ORDER — HEPARIN SOD (PORK) LOCK FLUSH 100 UNIT/ML IV SOLN
500.0000 [IU] | Freq: Once | INTRAVENOUS | Status: AC | PRN
  Administered 2021-02-08: 500 [IU]

## 2021-02-08 MED ORDER — TRASTUZUMAB-ANNS CHEMO 150 MG IV SOLR
6.0000 mg/kg | Freq: Once | INTRAVENOUS | Status: AC
  Administered 2021-02-08: 420 mg via INTRAVENOUS
  Filled 2021-02-08: qty 20

## 2021-02-08 MED ORDER — DIPHENHYDRAMINE HCL 25 MG PO CAPS
25.0000 mg | ORAL_CAPSULE | Freq: Once | ORAL | Status: AC
  Administered 2021-02-08: 25 mg via ORAL
  Filled 2021-02-08: qty 1

## 2021-02-08 MED ORDER — SODIUM CHLORIDE 0.9% FLUSH
10.0000 mL | Freq: Once | INTRAVENOUS | Status: AC
  Administered 2021-02-08: 10 mL

## 2021-02-08 MED ORDER — SODIUM CHLORIDE 0.9 % IV SOLN: Freq: Once | INTRAVENOUS | Status: AC

## 2021-02-08 NOTE — Patient Instructions (Signed)
[facility]   Discharge Instructions: Thank you for choosing [facility] to provide your oncology and hematology care.   If you have a lab appointment with the [facility], please go directly to the [facility] and check in at the registration area.   Wear comfortable clothing and clothing appropriate for easy access to any Portacath or PICC line.   We strive to give you quality time with your provider. You may need to reschedule your appointment if you arrive late (15 or more minutes).  Arriving late affects you and other patients whose appointments are after yours.  Also, if you miss three or more appointments without notifying the office, you may be dismissed from the clinic at the provider's discretion.      For prescription refill requests, have your pharmacy contact our office and allow 72 hours for refills to be completed.    Today you received the following chemotherapy and/or immunotherapy agents: Trastuzumab (Kanjinti)      To help prevent nausea and vomiting after your treatment, we encourage you to take your nausea medication as directed.  BELOW ARE SYMPTOMS THAT SHOULD BE REPORTED IMMEDIATELY: *FEVER GREATER THAN 100.4 F (38 C) OR HIGHER *CHILLS OR SWEATING *NAUSEA AND VOMITING THAT IS NOT CONTROLLED WITH YOUR NAUSEA MEDICATION *UNUSUAL SHORTNESS OF BREATH *UNUSUAL BRUISING OR BLEEDING *URINARY PROBLEMS (pain or burning when urinating, or frequent urination) *BOWEL PROBLEMS (unusual diarrhea, constipation, pain near the anus) TENDERNESS IN MOUTH AND THROAT WITH OR WITHOUT PRESENCE OF ULCERS (sore throat, sores in mouth, or a toothache) UNUSUAL RASH, SWELLING OR PAIN  UNUSUAL VAGINAL DISCHARGE OR ITCHING   Items with * indicate a potential emergency and should be followed up as soon as possible or go to the Emergency Department if any problems should occur.  Please show the CHEMOTHERAPY ALERT CARD or IMMUNOTHERAPY ALERT CARD  at check-in to the Emergency Department and triage nurse.  Should you have questions after your visit or need to cancel or reschedule your appointment, please contact [facility]  Dept: [phone]  and follow the prompts.  Office hours are 8:00 a.m. to 4:30 p.m. Monday - Friday. Please note that voicemails left after 4:00 p.m. may not be returned until the following business day.  We are closed weekends and major holidays. You have access to a nurse at all times for urgent questions. Please call the main number to the clinic Dept: [phone] and follow the prompts.   For any non-urgent questions, you may also contact your provider using MyChart. We now offer e-Visits for anyone 55 and older to request care online for non-urgent symptoms. For details visit [email or web address].   Also download the MyChart app! Go to the app store, search "MyChart", open the app, select [facility], and log in with your MyChart username and password.  Due to Covid, a mask is required upon entering the hospital/clinic. If you do not have a mask, one will be given to you upon arrival. For doctor visits, patients may have 1 support person aged 66 or older with them. For treatment visits, patients cannot have anyone with them due to current Covid guidelines and our immunocompromised population.

## 2021-02-10 ENCOUNTER — Other Ambulatory Visit: Payer: Self-pay

## 2021-02-10 ENCOUNTER — Ambulatory Visit: Payer: Medicare PPO

## 2021-02-10 DIAGNOSIS — R609 Edema, unspecified: Secondary | ICD-10-CM

## 2021-02-10 DIAGNOSIS — C50412 Malignant neoplasm of upper-outer quadrant of left female breast: Secondary | ICD-10-CM

## 2021-02-10 DIAGNOSIS — R293 Abnormal posture: Secondary | ICD-10-CM

## 2021-02-10 DIAGNOSIS — Z483 Aftercare following surgery for neoplasm: Secondary | ICD-10-CM | POA: Diagnosis not present

## 2021-02-10 NOTE — Therapy (Signed)
[place], [place], [address] Phone: [phone]   Fax:  [phone]  Physical Therapy Evaluation  Patient Details  Name: April [name] MRN: [id number] Date of Birth: [birth year] Referring Provider (PT): Dr. Jana Hakim   Encounter Date: 02/10/2021   PT End of Session - 02/10/21 1709     Visit Number 1    Number of Visits 12    Date for PT Re-Evaluation 03/24/21    Authorization Type [employer]    Authorization - Number of Visits 12    PT Start Time 0405    PT Stop Time 0450    PT Time Calculation (min) 45 min    Activity Tolerance Patient tolerated treatment well    Behavior During Therapy [facility] for tasks assessed/performed             Past Medical History:  Diagnosis Date   Breast cancer ([place]) 05/2017   right breast   Eczema    Family history of breast cancer    Family history of ovarian cancer    History of radiation therapy 10/17/17- 11/14/17   40.05 directed to the right breast in 15 fractions, followed by a boost of 10 gy given in 5 fractions.    Hypertension    toxemia during pregnancy, no meds now    Past Surgical History:  Procedure Laterality Date   ABDOMINAL HYSTERECTOMY     BREAST LUMPECTOMY WITH RADIOACTIVE SEED AND SENTINEL LYMPH NODE BIOPSY Right 07/17/2017   Procedure: BREAST LUMPECTOMY WITH RADIOACTIVE SEED AND SENTINEL LYMPH NODE BIOPSY;  Surgeon: Rolm Bookbinder, MD;  Location: [place];  Service: General;  Laterality: Right;   BREAST LUMPECTOMY WITH RADIOACTIVE SEED AND SENTINEL LYMPH NODE BIOPSY Left 07/13/2020   Procedure: LEFT BREAST LUMPECTOMY WITH RADIOACTIVE SEED AND LEFT AXILLARY SENTINEL LYMPH NODE BIOPSY;  Surgeon: Rolm Bookbinder, MD;  Location: [place];  Service: General;  Laterality: Left;   CESAREAN SECTION     x4   DILATION AND CURETTAGE OF UTERUS     KNEE SURGERY Right    PORT-A-CATH REMOVAL Right 09/04/2018    Procedure: REMOVAL PORT-A-CATH;  Surgeon: Rolm Bookbinder, MD;  Location: [place];  Service: General;  Laterality: Right;   PORTACATH PLACEMENT N/A 07/17/2017   Procedure: INSERTION PORT-A-CATH WITH [place];  Surgeon: Rolm Bookbinder, MD;  Location: [place];  Service: General;  Laterality: N/A;   PORTACATH PLACEMENT Right 07/13/2020   Procedure: INSERTION PORT-A-CATH WITH ULTRASOUND GUIDANCE;  Surgeon: Rolm Bookbinder, MD;  Location: [place];  Service: General;  Laterality: Right;    There were no vitals filed for this visit.    Subjective Assessment - 02/10/21 1605     Subjective Pt has completed chemotherapy and radiation and is presently having Herceptin until Jan 2023. Within the last 2 weeks she started feeling tight in left axillary region and firm in left breast and nipple is still swollen.  I used to be able to get it to go away with MLD. I did alot of cleaning and working at my mothers house last week and I wonder if thats what caused it?   Pertinent History Pt with prior history of right breast cancer  with  with surgery on 07/17/17 for right lumpectomy and SLNB with 5 LN removed.  Had chemo and radiation. Most recent surgery1/25/22 for left breast lumpectomy with 0/5 positive nodes. She will have chemo, radiation and infusions for immunotherapy.  Patient Stated Goals Want to decrease left breast swelling, decrease tightness left shoulder/axilla    Currently in Pain? Yes    Pain Score 2     Pain Location Chest    Pain Orientation Left    Pain Descriptors / Indicators Sore;Tightness;Heaviness    Pain Type Acute pain    Pain Onset 1 to 4 weeks ago    Pain Frequency Constant    Aggravating Factors  reaching, and using arms to end ranges, sleeping on left side    Effect of Pain on Daily Activities limits reaching, disturbed sleep                [facility] PT Assessment - 02/10/21 0001       Assessment   Medical Diagnosis  Left Breast Cancer    Referring Provider (PT) Dr. Jana Hakim    Onset Date/Surgical Date 07/13/20    Hand Dominance Right      Precautions   Precaution Comments lymphedema risk      Balance Screen   Has the patient fallen in the past 6 months No    Has the patient had a decrease in activity level because of a fear of falling?  No    Is the patient reluctant to leave their home because of a fear of falling?  No      Home Ecologist residence    Living Arrangements Spouse/significant other    Available Help at Discharge Friend(s)      Prior Function   Level of [place] Retired    Leisure walking 1.5 miles, vibration plate,      Cognition   Overall Cognitive Status Within Functional Limits for tasks assessed      Observation/Other Assessments   Observations Left breast with considerable generalized swelling but without noted fibrosis, swelling also noted at inferior axillary region on left.  No visible or palpable cording noted today      AROM   Left Shoulder Extension 50 Degrees    Left Shoulder Flexion 157 Degrees    Left Shoulder ABduction 170 Degrees    Left Shoulder External Rotation 92 Degrees               LYMPHEDEMA/ONCOLOGY QUESTIONNAIRE - 02/10/21 0001       Surgeries   Lumpectomy Date 07/17/17   right   Other Surgery Date 07/13/20   Left lumpectomy with SLNB   Number Lymph Nodes Removed 5      Treatment   Active Chemotherapy Treatment No    Date 07/27/20    Past Chemotherapy Treatment Yes    Active Radiation Treatment No    Past Radiation Treatment Yes    Current Hormone Treatment No    Past Hormone Therapy No      What other symptoms do you have   Are you Having Heaviness or Tightness Yes    Are you having Pain Yes    Are you having pitting edema No    Is it Hard or Difficult finding clothes that fit No    Do you have infections No    Is there Decreased scar mobility No    [place] Sign  No      Right Upper Extremity Lymphedema   15 cm Proximal to Olecranon Process 32.2 cm    10 cm Proximal to Olecranon Process 29.2 cm    Olecranon Process 24.6 cm    15 cm Proximal to Ulnar Styloid Process  23.1 cm    10 cm Proximal to Ulnar Styloid Process 19.3 cm    Just Proximal to Ulnar Styloid Process 14.4 cm    Across Hand at [employer] 18.2 cm    At [place] of 2nd Digit 5.8 cm      Left Upper Extremity Lymphedema   15 cm Proximal to Olecranon Process 30.7 cm    10 cm Proximal to Olecranon Process 28.8 cm    Olecranon Process 24.3 cm    15 cm Proximal to Ulnar Styloid Process 21.8 cm    10 cm Proximal to Ulnar Styloid Process 18.2 cm    Just Proximal to Ulnar Styloid Process 13.8 cm    Across Hand at [employer] 17.3 cm    At [place] of 2nd Digit 5.6 cm                     Objective measurements completed on examination: See above findings.               PT Education - 02/10/21 1659     Education Details Pt was educated in supine wand flex and scaption, stargazer, and standing wall slides for ABD    Person(s) Educated Patient    Methods Handout;Explanation    Comprehension Returned demonstration                 PT Long Term Goals - 02/10/21 1706       PT LONG TERM GOAL #1   Title Patient will demonstrate she has returned to baseline since surgery related to shoulder ROM and function.    Time 6    Period Weeks    Target Date 03/24/21      PT LONG TERM GOAL #2   Title Pt will be independent in left breast MLD    Time 6    Status New    Target Date 03/24/21      PT LONG TERM GOAL #3   Title Pt will report decreased breast heaviness by 50% or greater    Time 6    Period Weeks    Status New    Target Date 03/24/21                    Plan - 02/10/21 1700     Clinical Impression Statement Pt returns with complaints of tightness in the left axillary region limiting ROM and left breast swelling and heaviness without  noticeable fibrosis at this time.  She has not been wearing her compression bra nor has she kept up with MLD.  Her left shoulder ROM is decreased since her last visit in May. There is no visible or palpable cording presently.  There is no measurable increase in edema in the left UE.  She was started back to [facility] exs today with wand, stargazer and wall slides to improve ROM.  She was advised to start wearing her compression bra and since sleeping is uncomfortable she may try wearing her bra at night.  She was also advised to resume MLD.  She will benefit from skilled PT to address deficits and return to PLOF    Personal Factors and Comorbidities Comorbidity 2    Comorbidities Hx of bilateral breast CA with new onset of left Breast CA. ( right in 2019), lymphedema risk    Stability/Clinical Decision Making Stable/Uncomplicated    Rehab Potential Excellent    PT Frequency 2x / week    PT Duration  6 weeks    PT Treatment/Interventions ADLs/Self Care Home Management;Therapeutic exercise;Patient/family education;Manual techniques;Manual lymph drainage;Passive range of motion    PT Next Visit Plan Cont SOZO screens, AAROM/pulleys/PROM, left breast MLD and review with pt,    PT Home Exercise Plan Supine wand, star gazer, abduction wall slides, wear compression bra    Consulted and Agree with Plan of Care Patient             Patient will benefit from skilled therapeutic intervention in order to improve the following deficits and impairments:  Postural dysfunction, Decreased knowledge of precautions, Increased edema, Decreased range of motion, Decreased strength, Pain  Visit Diagnosis: Malignant neoplasm of upper-outer quadrant of left female breast, unspecified estrogen receptor status ([place])  Aftercare following surgery for neoplasm  Edema, unspecified type  Abnormal posture     Problem List Patient Active Problem List   Diagnosis Date Noted   Age-related osteoporosis without current  pathological fracture 09/08/2020   Allergic rhinitis 09/08/2020   Palpitations 09/08/2020   Personal history of malignant neoplasm of breast 09/08/2020   Personal history of transient ischemic attack (TIA), and cerebral infarction without residual deficits 09/08/2020   Pure hypercholesterolemia 09/08/2020   Vitamin D deficiency 09/08/2020   Neuropathy due to chemotherapeutic drug ([place]) 07/23/2018   Port-A-Cath in place 08/28/2017   TIA (transient ischemic attack) 08/09/2017   Hypertension    Genetic testing 07/06/2017   Family history of ovarian cancer    Family history of breast cancer    Malignant neoplasm of upper-outer quadrant of right breast in female, estrogen receptor negative ([place]) 06/26/2017   Malignant neoplasm of left breast, estrogen receptor positive ([place]) 05/19/2017    Claris Pong 02/10/2021, 5:16 PM  [place], [place], [address] Phone: [phone]   Fax:  [phone]  Name: BRITTINEE YALDO MRN: [id number] Date of Birth: 1951-01-17 Cheral Almas, PT 02/10/21 5:19 PM

## 2021-02-10 NOTE — Patient Instructions (Addendum)
SHOULDER: Flexion - Supine (Cane)        Cancer Rehab [phone]    Hold cane in both hands. Raise arms up overhead. Do not allow back to arch. Hold _5__ seconds. Do __5-10__ times; __1-2__ times a day. Hands shoulder width apart 2.    Hands wider apart : V position    Shoulder Blade Stretch    Clasp fingers behind head with elbows touching in front of face. Pull elbows back while pressing shoulder blades together. Relax and hold as tolerated, can place pillow under elbow here for comfort as needed and to allow for prolonged stretch.  Repeat __5__ times. Do __1-2__ sessions per day.         WALL SLIDES for ABDUCTION

## 2021-02-17 DIAGNOSIS — Z170 Estrogen receptor positive status [ER+]: Secondary | ICD-10-CM | POA: Diagnosis not present

## 2021-02-17 DIAGNOSIS — C50212 Malignant neoplasm of upper-inner quadrant of left female breast: Secondary | ICD-10-CM | POA: Diagnosis not present

## 2021-02-18 ENCOUNTER — Other Ambulatory Visit (HOSPITAL_COMMUNITY): Payer: Self-pay | Admitting: Internal Medicine

## 2021-03-01 ENCOUNTER — Inpatient Hospital Stay: Payer: Medicare PPO | Attending: Oncology

## 2021-03-01 ENCOUNTER — Other Ambulatory Visit: Payer: Self-pay

## 2021-03-01 ENCOUNTER — Other Ambulatory Visit: Payer: Medicare PPO

## 2021-03-01 ENCOUNTER — Inpatient Hospital Stay: Payer: Medicare PPO

## 2021-03-01 VITALS — BP 154/82 | HR 69 | Temp 98.8°F | Resp 18 | Wt 151.4 lb

## 2021-03-01 DIAGNOSIS — Z171 Estrogen receptor negative status [ER-]: Secondary | ICD-10-CM | POA: Diagnosis not present

## 2021-03-01 DIAGNOSIS — Z95828 Presence of other vascular implants and grafts: Secondary | ICD-10-CM

## 2021-03-01 DIAGNOSIS — C50411 Malignant neoplasm of upper-outer quadrant of right female breast: Secondary | ICD-10-CM | POA: Diagnosis not present

## 2021-03-01 DIAGNOSIS — Z5112 Encounter for antineoplastic immunotherapy: Secondary | ICD-10-CM | POA: Diagnosis not present

## 2021-03-01 LAB — CBC WITH DIFFERENTIAL (CANCER CENTER ONLY)
Abs Immature Granulocytes: 0.01 10*3/uL (ref 0.00–0.07)
Basophils Absolute: 0 10*3/uL (ref 0.0–0.1)
Basophils Relative: 1 %
Eosinophils Absolute: 0.1 10*3/uL (ref 0.0–0.5)
Eosinophils Relative: 4 %
HCT: 32.3 % — ABNORMAL LOW (ref 36.0–46.0)
Hemoglobin: 10.8 g/dL — ABNORMAL LOW (ref 12.0–15.0)
Immature Granulocytes: 0 %
Lymphocytes Relative: 31 %
Lymphs Abs: 1.1 10*3/uL (ref 0.7–4.0)
MCH: 31 pg (ref 26.0–34.0)
MCHC: 33.4 g/dL (ref 30.0–36.0)
MCV: 92.8 fL (ref 80.0–100.0)
Monocytes Absolute: 0.4 10*3/uL (ref 0.1–1.0)
Monocytes Relative: 12 %
Neutro Abs: 1.8 10*3/uL (ref 1.7–7.7)
Neutrophils Relative %: 52 %
Platelet Count: 205 10*3/uL (ref 150–400)
RBC: 3.48 MIL/uL — ABNORMAL LOW (ref 3.87–5.11)
RDW: 12.3 % (ref 11.5–15.5)
WBC Count: 3.4 10*3/uL — ABNORMAL LOW (ref 4.0–10.5)
nRBC: 0 % (ref 0.0–0.2)

## 2021-03-01 LAB — CMP (CANCER CENTER ONLY)
ALT: 32 U/L (ref 0–44)
AST: 26 U/L (ref 15–41)
Albumin: 4 g/dL (ref 3.5–5.0)
Alkaline Phosphatase: 71 U/L (ref 38–126)
Anion gap: 10 (ref 5–15)
BUN: 17 mg/dL (ref 8–23)
CO2: 26 mmol/L (ref 22–32)
Calcium: 10 mg/dL (ref 8.9–10.3)
Chloride: 104 mmol/L (ref 98–111)
Creatinine: 1.02 mg/dL — ABNORMAL HIGH (ref 0.44–1.00)
GFR, Estimated: 59 mL/min — ABNORMAL LOW (ref >60)
Glucose, Bld: 115 mg/dL — ABNORMAL HIGH (ref 70–99)
Potassium: 3.8 mmol/L (ref 3.5–5.1)
Sodium: 140 mmol/L (ref 135–145)
Total Bilirubin: 0.4 mg/dL (ref 0.3–1.2)
Total Protein: 7.5 g/dL (ref 6.5–8.1)

## 2021-03-01 MED ORDER — TRASTUZUMAB-ANNS CHEMO 150 MG IV SOLR
6.0000 mg/kg | Freq: Once | INTRAVENOUS | Status: AC
  Administered 2021-03-01: 420 mg via INTRAVENOUS
  Filled 2021-03-01: qty 20

## 2021-03-01 MED ORDER — HEPARIN SOD (PORK) LOCK FLUSH 100 UNIT/ML IV SOLN
500.0000 [IU] | Freq: Once | INTRAVENOUS | Status: AC | PRN
  Administered 2021-03-01: 500 [IU]

## 2021-03-01 MED ORDER — DIPHENHYDRAMINE HCL 25 MG PO CAPS
25.0000 mg | ORAL_CAPSULE | Freq: Once | ORAL | Status: AC
  Administered 2021-03-01: 25 mg via ORAL
  Filled 2021-03-01: qty 1

## 2021-03-01 MED ORDER — SODIUM CHLORIDE 0.9 % IV SOLN
Freq: Once | INTRAVENOUS | Status: AC
Start: 2021-03-01 — End: 2021-03-01

## 2021-03-01 MED ORDER — SODIUM CHLORIDE 0.9% FLUSH
10.0000 mL | Freq: Once | INTRAVENOUS | Status: AC
  Administered 2021-03-01: 10 mL

## 2021-03-01 MED ORDER — SODIUM CHLORIDE 0.9% FLUSH
10.0000 mL | INTRAVENOUS | Status: DC | PRN
  Administered 2021-03-01: 10 mL

## 2021-03-01 MED ORDER — ACETAMINOPHEN 325 MG PO TABS
650.0000 mg | ORAL_TABLET | Freq: Once | ORAL | Status: AC
  Administered 2021-03-01: 650 mg via ORAL
  Filled 2021-03-01: qty 2

## 2021-03-01 NOTE — Patient Instructions (Signed)
[facility]  Discharge Instructions: °Thank you for choosing [facility] to provide your oncology and hematology care.  ° °If you have a lab appointment with the Cancer Center, please go directly to the Cancer Center and check in at the registration area. °  °Wear comfortable clothing and clothing appropriate for easy access to any Portacath or PICC line.  ° °We strive to give you quality time with your provider. You may need to reschedule your appointment if you arrive late (15 or more minutes).  Arriving late affects you and other patients whose appointments are after yours.  Also, if you miss three or more appointments without notifying the office, you may be dismissed from the clinic at the provider’s discretion.    °  °For prescription refill requests, have your pharmacy contact our office and allow 72 hours for refills to be completed.   ° °Today you received the following chemotherapy and/or immunotherapy agents: Kanjinti °  °To help prevent nausea and vomiting after your treatment, we encourage you to take your nausea medication as directed. ° °BELOW ARE SYMPTOMS THAT SHOULD BE REPORTED IMMEDIATELY: °*FEVER GREATER THAN 100.4 F (38 °C) OR HIGHER °*CHILLS OR SWEATING °*NAUSEA AND VOMITING THAT IS NOT CONTROLLED WITH YOUR NAUSEA MEDICATION °*UNUSUAL SHORTNESS OF BREATH °*UNUSUAL BRUISING OR BLEEDING °*URINARY PROBLEMS (pain or burning when urinating, or frequent urination) °*BOWEL PROBLEMS (unusual diarrhea, constipation, pain near the anus) °TENDERNESS IN MOUTH AND THROAT WITH OR WITHOUT PRESENCE OF ULCERS (sore throat, sores in mouth, or a toothache) °UNUSUAL RASH, SWELLING OR PAIN  °UNUSUAL VAGINAL DISCHARGE OR ITCHING  ° °Items with * indicate a potential emergency and should be followed up as soon as possible or go to the Emergency Department if any problems should occur. ° °Please show the CHEMOTHERAPY ALERT CARD or IMMUNOTHERAPY ALERT CARD at check-in to the  Emergency Department and triage nurse. ° °Should you have questions after your visit or need to cancel or reschedule your appointment, please contact [facility]  Dept: [phone]  and follow the prompts.  Office hours are 8:00 a.m. to 4:30 p.m. Monday - Friday. Please note that voicemails left after 4:00 p.m. may not be returned until the following business day.  We are closed weekends and major holidays. You have access to a nurse at all times for urgent questions. Please call the main number to the clinic Dept: [phone] and follow the prompts. ° ° °For any non-urgent questions, you may also contact your provider using MyChart. We now offer e-Visits for anyone 18 and older to request care online for non-urgent symptoms. For details visit [email or web address]. °  °Also download the MyChart app! Go to the app store, search "MyChart", open the app, select [facility], and log in with your MyChart username and password. ° °Due to Covid, a mask is required upon entering the hospital/clinic. If you do not have a mask, one will be given to you upon arrival. For doctor visits, patients may have 1 support person aged 18 or older with them. For treatment visits, patients cannot have anyone with them due to current Covid guidelines and our immunocompromised population.  ° °

## 2021-03-02 ENCOUNTER — Ambulatory Visit: Payer: Medicare PPO | Attending: Oncology

## 2021-03-02 DIAGNOSIS — R293 Abnormal posture: Secondary | ICD-10-CM | POA: Diagnosis not present

## 2021-03-02 DIAGNOSIS — C50412 Malignant neoplasm of upper-outer quadrant of left female breast: Secondary | ICD-10-CM | POA: Diagnosis not present

## 2021-03-02 DIAGNOSIS — R609 Edema, unspecified: Secondary | ICD-10-CM | POA: Insufficient documentation

## 2021-03-02 DIAGNOSIS — Z483 Aftercare following surgery for neoplasm: Secondary | ICD-10-CM | POA: Diagnosis not present

## 2021-03-02 NOTE — Therapy (Signed)
[place], [place], [address] Phone: [phone]   Fax:  [phone]  Physical Therapy Treatment  Patient Details  Name: April [name] MRN: [id number] Date of Birth: [birth year] Referring Provider (PT): Dr. Jana Hakim   Encounter Date: 03/02/2021   PT End of Session - 03/02/21 1225     Visit Number 2    Number of Visits 12    Date for PT Re-Evaluation 03/24/21    Authorization Type [employer]    Authorization - Number of Visits 12    PT Start Time 1000    PT Stop Time [id number]    PT Time Calculation (min) 51 min    Activity Tolerance Patient tolerated treatment well    Behavior During Therapy [facility] for tasks assessed/performed             Past Medical History:  Diagnosis Date   Breast cancer ([place]) 05/2017   right breast   Eczema    Family history of breast cancer    Family history of ovarian cancer    History of radiation therapy 10/17/17- 11/14/17   40.05 directed to the right breast in 15 fractions, followed by a boost of 10 gy given in 5 fractions.    Hypertension    toxemia during pregnancy, no meds now    Past Surgical History:  Procedure Laterality Date   ABDOMINAL HYSTERECTOMY     BREAST LUMPECTOMY WITH RADIOACTIVE SEED AND SENTINEL LYMPH NODE BIOPSY Right 07/17/2017   Procedure: BREAST LUMPECTOMY WITH RADIOACTIVE SEED AND SENTINEL LYMPH NODE BIOPSY;  Surgeon: Rolm Bookbinder, MD;  Location: [place];  Service: General;  Laterality: Right;   BREAST LUMPECTOMY WITH RADIOACTIVE SEED AND SENTINEL LYMPH NODE BIOPSY Left 07/13/2020   Procedure: LEFT BREAST LUMPECTOMY WITH RADIOACTIVE SEED AND LEFT AXILLARY SENTINEL LYMPH NODE BIOPSY;  Surgeon: Rolm Bookbinder, MD;  Location: [place];  Service: General;  Laterality: Left;   CESAREAN SECTION     x4   DILATION AND CURETTAGE OF UTERUS     KNEE SURGERY Right    PORT-A-CATH REMOVAL Right 09/04/2018    Procedure: REMOVAL PORT-A-CATH;  Surgeon: Rolm Bookbinder, MD;  Location: [place];  Service: General;  Laterality: Right;   PORTACATH PLACEMENT N/A 07/17/2017   Procedure: INSERTION PORT-A-CATH WITH [place];  Surgeon: Rolm Bookbinder, MD;  Location: [place];  Service: General;  Laterality: N/A;   PORTACATH PLACEMENT Right 07/13/2020   Procedure: INSERTION PORT-A-CATH WITH ULTRASOUND GUIDANCE;  Surgeon: Rolm Bookbinder, MD;  Location: [place];  Service: General;  Laterality: Right;    There were no vitals filed for this visit.   Subjective Assessment - 03/02/21 1000     Subjective I have been wearing the compression bra day and night and swelling does seem better.  What bothers me most is where the bra rubs my skin under the arm.  I have been doing the stretching exercises and the tightness under my armpit is alot better.  I can lay my arm flat on the bed with the stargazer stretch.  I have had shooting pains in the left breast for several days all aound my breast.  Hasn't happened yet today. Overall pain is better too    Pertinent History Pt with prior history of right breast cancer  with  with surgery on 07/17/17 for right lumpectomy and SLNB with 5 LN removed.  Had chemo and radiation. Most recent surgery1/25/22 for left breast lumpectomy  with 0/5 positive nodes. She will have chemo, radiation and infusions for immunotherapy.    Patient Stated Goals Want to decrease left breast swelling, decrease tightness left shoulder/axilla    Currently in Pain? No/denies    Pain Score 1                                [facility] Adult PT Treatment/Exercise - 03/02/21 0001       Manual Therapy   Edema Management 2 foam pads in stockinette were made for padding axillary region of bra and 2 were made for padding under bottom band of bra    Manual Lymphatic Drainage (MLD) Pt. performed supraclavicular, left axillary LN, left  axillo-inguinal pathway, and left lateral trunk in supine with head of table elevated  and added left breast clearing the lateral side first and retracing pathways then medial side retracing pathway  and ending with LN's.    Passive ROM PROM left shoulder briefly in all directions                          PT Long Term Goals - 02/10/21 1706       PT LONG TERM GOAL #1   Title Patient will demonstrate she has returned to baseline since surgery related to shoulder ROM and function.    Time 6    Period Weeks    Target Date 03/24/21      PT LONG TERM GOAL #2   Title Pt will be independent in left breast MLD    Time 6    Status New    Target Date 03/24/21      PT LONG TERM GOAL #3   Title Pt will report decreased breast heaviness by 50% or greater    Time 6    Period Weeks    Status New    Target Date 03/24/21                   Plan - 03/02/21 1228     Clinical Impression Statement Pt. has been wearing her compression bra consistently and has been doing her exercises for ROM.  We reviewed self Breast MLD and made several modifications to the sequence she was using and slight modifications to her technique.  2 foam pads were made to place under axillary border of compression bra, and 2 for under bottom band to relieve pressure and soreness in these areas.  She reported it felt much more comfortable with the foam in.    Personal Factors and Comorbidities Comorbidity 2    Comorbidities Hx of bilateral breast CA with new onset of left Breast CA. ( right in 2019), lymphedema risk    Stability/Clinical Decision Making Stable/Uncomplicated    Rehab Potential Excellent    PT Frequency 2x / week    PT Duration 6 weeks    PT Treatment/Interventions ADLs/Self Care Home Management;Therapeutic exercise;Patient/family education;Manual techniques;Manual lymph drainage;Passive range of motion    PT Next Visit Plan Cont SOZO screens, AAROM/pulleys/PROM, left breast MLD and  review with pt,, STM to pecs/lats, pec stretches, pulleys, ball    Consulted and Agree with Plan of Care Patient             Patient will benefit from skilled therapeutic intervention in order to improve the following deficits and impairments:  Postural dysfunction, Decreased knowledge of precautions, Increased edema, Decreased range of motion, Decreased  strength, Pain  Visit Diagnosis: Malignant neoplasm of upper-outer quadrant of left female breast, unspecified estrogen receptor status ([place])  Aftercare following surgery for neoplasm  Edema, unspecified type  Abnormal posture     Problem List Patient Active Problem List   Diagnosis Date Noted   Age-related osteoporosis without current pathological fracture 09/08/2020   Allergic rhinitis 09/08/2020   Palpitations 09/08/2020   Personal history of malignant neoplasm of breast 09/08/2020   Personal history of transient ischemic attack (TIA), and cerebral infarction without residual deficits 09/08/2020   Pure hypercholesterolemia 09/08/2020   Vitamin D deficiency 09/08/2020   Neuropathy due to chemotherapeutic drug ([place]) 07/23/2018   Port-A-Cath in place 08/28/2017   TIA (transient ischemic attack) 08/09/2017   Hypertension    Genetic testing 07/06/2017   Family history of ovarian cancer    Family history of breast cancer    Malignant neoplasm of upper-outer quadrant of right breast in female, estrogen receptor negative ([place]) 06/26/2017   Malignant neoplasm of left breast, estrogen receptor positive ([place]) 05/19/2017    Claris Pong, PT 03/02/2021, 12:38 PM  [place], [place], [address] Phone: [phone]   Fax:  [phone]  Name: ELLAMARIE TRIANO MRN: [id number] Date of Birth: 10-16-1950 .rob

## 2021-03-03 ENCOUNTER — Other Ambulatory Visit: Payer: Self-pay

## 2021-03-03 ENCOUNTER — Ambulatory Visit: Payer: Medicare PPO

## 2021-03-03 DIAGNOSIS — R293 Abnormal posture: Secondary | ICD-10-CM | POA: Diagnosis not present

## 2021-03-03 DIAGNOSIS — Z483 Aftercare following surgery for neoplasm: Secondary | ICD-10-CM

## 2021-03-03 DIAGNOSIS — C50412 Malignant neoplasm of upper-outer quadrant of left female breast: Secondary | ICD-10-CM

## 2021-03-03 DIAGNOSIS — R609 Edema, unspecified: Secondary | ICD-10-CM

## 2021-03-03 NOTE — Therapy (Signed)
[place], [place], [address] Phone: [phone]   Fax:  [phone]  Physical Therapy Treatment  Patient Details  Name: April [name] MRN: [id number] Date of Birth: 18-Mar-1951 Referring Provider (PT): Dr. Jana Hakim   Encounter Date: 03/03/2021   PT End of Session - 03/03/21 1516     Visit Number 3    Number of Visits 12    Date for PT Re-Evaluation 03/24/21    Authorization Type [employer]    Authorization - Number of Visits 12    PT Start Time 1504    PT Stop Time 1551    PT Time Calculation (min) 47 min    Activity Tolerance Patient tolerated treatment well    Behavior During Therapy [facility] for tasks assessed/performed             Past Medical History:  Diagnosis Date   Breast cancer ([place]) 05/2017   right breast   Eczema    Family history of breast cancer    Family history of ovarian cancer    History of radiation therapy 10/17/17- 11/14/17   40.05 directed to the right breast in 15 fractions, followed by a boost of 10 gy given in 5 fractions.    Hypertension    toxemia during pregnancy, no meds now    Past Surgical History:  Procedure Laterality Date   ABDOMINAL HYSTERECTOMY     BREAST LUMPECTOMY WITH RADIOACTIVE SEED AND SENTINEL LYMPH NODE BIOPSY Right 07/17/2017   Procedure: BREAST LUMPECTOMY WITH RADIOACTIVE SEED AND SENTINEL LYMPH NODE BIOPSY;  Surgeon: Rolm Bookbinder, MD;  Location: [place];  Service: General;  Laterality: Right;   BREAST LUMPECTOMY WITH RADIOACTIVE SEED AND SENTINEL LYMPH NODE BIOPSY Left 07/13/2020   Procedure: LEFT BREAST LUMPECTOMY WITH RADIOACTIVE SEED AND LEFT AXILLARY SENTINEL LYMPH NODE BIOPSY;  Surgeon: Rolm Bookbinder, MD;  Location: [place];  Service: General;  Laterality: Left;   CESAREAN SECTION     x4   DILATION AND CURETTAGE OF UTERUS     KNEE SURGERY Right    PORT-A-CATH REMOVAL Right 09/04/2018    Procedure: REMOVAL PORT-A-CATH;  Surgeon: Rolm Bookbinder, MD;  Location: [place];  Service: General;  Laterality: Right;   PORTACATH PLACEMENT N/A 07/17/2017   Procedure: INSERTION PORT-A-CATH WITH [place];  Surgeon: Rolm Bookbinder, MD;  Location: [place];  Service: General;  Laterality: N/A;   PORTACATH PLACEMENT Right 07/13/2020   Procedure: INSERTION PORT-A-CATH WITH ULTRASOUND GUIDANCE;  Surgeon: Rolm Bookbinder, MD;  Location: [place];  Service: General;  Laterality: Right;    There were no vitals filed for this visit.   Subjective Assessment - 03/03/21 1505     Subjective The foam at the axillary regions of my bra stay in and it feels better.  The  foam at the bottom fell out. It may need to be bigger.  The achiness behind my left shoulder is better today too. I haven't had any of the shooting pains since I was here last.    Pertinent History Pt with prior history of right breast cancer  with  with surgery on 07/17/17 for right lumpectomy and SLNB with 5 LN removed.  Had chemo and radiation. Most recent surgery1/25/22 for left breast lumpectomy with 0/5 positive nodes. She will have chemo, radiation and infusions for immunotherapy.    Currently in Pain? No/denies    Pain Score 0-No pain  [facility] Adult PT Treatment/Exercise - 03/03/21 0001       Shoulder Exercises: Supine   Other Supine Exercises Supine AROM flex, scaption, abduction x 5 ea      Shoulder Exercises: Standing   Other Standing Exercises ball stabs on wall with purple ball;up and down, side to side x 20      Shoulder Exercises: Pulleys   Flexion 2 minutes    ABduction 2 minutes      Shoulder Exercises: Therapy Ball   Flexion Both;10 reps    ABduction Left;10 reps      Manual Therapy   Soft tissue mobilization soft tissue work to left pectoralis, lats, serratus, and scapular area    Passive ROM PROM left  shoulder flex, scaption, abd, D2 flex                          PT Long Term Goals - 02/10/21 1706       PT LONG TERM GOAL #1   Title Patient will demonstrate she has returned to baseline since surgery related to shoulder ROM and function.    Time 6    Period Weeks    Target Date 03/24/21      PT LONG TERM GOAL #2   Title Pt will be independent in left breast MLD    Time 6    Status New    Target Date 03/24/21      PT LONG TERM GOAL #3   Title Pt will report decreased breast heaviness by 50% or greater    Time 6    Period Weeks    Status New    Target Date 03/24/21                   Plan - 03/03/21 1517     Clinical Impression Statement Pt reports decrease left breast heaviness. She is still tight and tender in her left pectoralis and lats as well as the left scapular area.  PROM is improving nicely, and she did well with exercises performed today. Foam pads placed at axillary in bra are helpful for decreasing soreness    Personal Factors and Comorbidities Comorbidity 2    Comorbidities Hx of bilateral breast CA with new onset of left Breast CA. ( right in 2019), lymphedema risk    Stability/Clinical Decision Making Stable/Uncomplicated    Rehab Potential Excellent    PT Frequency 2x / week    PT Duration 6 weeks    PT Treatment/Interventions ADLs/Self Care Home Management;Therapeutic exercise;Patient/family education;Manual techniques;Manual lymph drainage;Passive range of motion    PT Next Visit Plan Cont SOZO screens, AAROM/pulleys/PROM, left breast MLD and review with pt,, STM to pecs/lats, pec stretches, pulleys, ball , foam pads under bra band   PT Home Exercise Plan Supine wand, star gazer, abduction wall slides, wear compression bra    Consulted and Agree with Plan of Care Patient             Patient will benefit from skilled therapeutic intervention in order to improve the following deficits and impairments:  Postural dysfunction,  Decreased knowledge of precautions, Increased edema, Decreased range of motion, Decreased strength, Pain  Visit Diagnosis: Malignant neoplasm of upper-outer quadrant of left female breast, unspecified estrogen receptor status ([place])  Aftercare following surgery for neoplasm  Edema, unspecified type  Abnormal posture     Problem List Patient Active Problem List   Diagnosis Date Noted   Age-related osteoporosis without current  pathological fracture 09/08/2020   Allergic rhinitis 09/08/2020   Palpitations 09/08/2020   Personal history of malignant neoplasm of breast 09/08/2020   Personal history of transient ischemic attack (TIA), and cerebral infarction without residual deficits 09/08/2020   Pure hypercholesterolemia 09/08/2020   Vitamin D deficiency 09/08/2020   Neuropathy due to chemotherapeutic drug ([place]) 07/23/2018   Port-A-Cath in place 08/28/2017   TIA (transient ischemic attack) 08/09/2017   Hypertension    Genetic testing 07/06/2017   Family history of ovarian cancer    Family history of breast cancer    Malignant neoplasm of upper-outer quadrant of right breast in female, estrogen receptor negative ([place]) 06/26/2017   Malignant neoplasm of left breast, estrogen receptor positive ([place]) 05/19/2017    Claris Pong, PT 03/03/2021, 3:59 PM  [place], [place], [address] Phone: [phone]   Fax:  [phone]  Name: April [name] MRN: [id number] Date of Birth: September 24, 1950

## 2021-03-08 ENCOUNTER — Ambulatory Visit (HOSPITAL_COMMUNITY): Payer: Medicare PPO | Attending: Cardiology

## 2021-03-08 ENCOUNTER — Other Ambulatory Visit: Payer: Self-pay

## 2021-03-08 DIAGNOSIS — C50012 Malignant neoplasm of nipple and areola, left female breast: Secondary | ICD-10-CM | POA: Insufficient documentation

## 2021-03-08 DIAGNOSIS — Z8249 Family history of ischemic heart disease and other diseases of the circulatory system: Secondary | ICD-10-CM | POA: Diagnosis not present

## 2021-03-08 DIAGNOSIS — C50411 Malignant neoplasm of upper-outer quadrant of right female breast: Secondary | ICD-10-CM | POA: Diagnosis not present

## 2021-03-08 DIAGNOSIS — I10 Essential (primary) hypertension: Secondary | ICD-10-CM | POA: Insufficient documentation

## 2021-03-08 DIAGNOSIS — Z170 Estrogen receptor positive status [ER+]: Secondary | ICD-10-CM | POA: Diagnosis not present

## 2021-03-08 DIAGNOSIS — Z0189 Encounter for other specified special examinations: Secondary | ICD-10-CM | POA: Diagnosis not present

## 2021-03-08 DIAGNOSIS — Z01818 Encounter for other preprocedural examination: Secondary | ICD-10-CM | POA: Diagnosis not present

## 2021-03-08 DIAGNOSIS — Z171 Estrogen receptor negative status [ER-]: Secondary | ICD-10-CM

## 2021-03-08 LAB — ECHOCARDIOGRAM COMPLETE
Area-P 1/2: 3.88 cm2
S' Lateral: 2.5 cm

## 2021-03-09 ENCOUNTER — Ambulatory Visit: Payer: Medicare PPO

## 2021-03-09 ENCOUNTER — Other Ambulatory Visit (HOSPITAL_COMMUNITY): Payer: Self-pay | Admitting: Internal Medicine

## 2021-03-09 DIAGNOSIS — Z483 Aftercare following surgery for neoplasm: Secondary | ICD-10-CM | POA: Diagnosis not present

## 2021-03-09 DIAGNOSIS — C50412 Malignant neoplasm of upper-outer quadrant of left female breast: Secondary | ICD-10-CM

## 2021-03-09 DIAGNOSIS — R609 Edema, unspecified: Secondary | ICD-10-CM

## 2021-03-09 DIAGNOSIS — R293 Abnormal posture: Secondary | ICD-10-CM | POA: Diagnosis not present

## 2021-03-09 NOTE — Therapy (Signed)
[place], [place], [address] Phone: [phone]   Fax:  [phone]  Physical Therapy Treatment  Patient Details  Name: April [name] MRN: [id number] Date of Birth: 1950/08/02 Referring Provider (PT): Dr. Jana Hakim   Encounter Date: 03/09/2021   PT End of Session - 03/09/21 1110     Visit Number 4    Number of Visits 12    Date for PT Re-Evaluation 03/24/21    Authorization Type [employer]    Authorization - Number of Visits 12    PT Start Time 1100    PT Stop Time 1156    PT Time Calculation (min) 56 min    Activity Tolerance Patient tolerated treatment well    Behavior During Therapy [facility] for tasks assessed/performed             Past Medical History:  Diagnosis Date   Breast cancer ([place]) 05/2017   right breast   Eczema    Family history of breast cancer    Family history of ovarian cancer    History of radiation therapy 10/17/17- 11/14/17   40.05 directed to the right breast in 15 fractions, followed by a boost of 10 gy given in 5 fractions.    Hypertension    toxemia during pregnancy, no meds now    Past Surgical History:  Procedure Laterality Date   ABDOMINAL HYSTERECTOMY     BREAST LUMPECTOMY WITH RADIOACTIVE SEED AND SENTINEL LYMPH NODE BIOPSY Right 07/17/2017   Procedure: BREAST LUMPECTOMY WITH RADIOACTIVE SEED AND SENTINEL LYMPH NODE BIOPSY;  Surgeon: Rolm Bookbinder, MD;  Location: [place];  Service: General;  Laterality: Right;   BREAST LUMPECTOMY WITH RADIOACTIVE SEED AND SENTINEL LYMPH NODE BIOPSY Left 07/13/2020   Procedure: LEFT BREAST LUMPECTOMY WITH RADIOACTIVE SEED AND LEFT AXILLARY SENTINEL LYMPH NODE BIOPSY;  Surgeon: Rolm Bookbinder, MD;  Location: [place];  Service: General;  Laterality: Left;   CESAREAN SECTION     x4   DILATION AND CURETTAGE OF UTERUS     KNEE SURGERY Right    PORT-A-CATH REMOVAL Right 09/04/2018    Procedure: REMOVAL PORT-A-CATH;  Surgeon: Rolm Bookbinder, MD;  Location: [place];  Service: General;  Laterality: Right;   PORTACATH PLACEMENT N/A 07/17/2017   Procedure: INSERTION PORT-A-CATH WITH [place];  Surgeon: Rolm Bookbinder, MD;  Location: [place];  Service: General;  Laterality: N/A;   PORTACATH PLACEMENT Right 07/13/2020   Procedure: INSERTION PORT-A-CATH WITH ULTRASOUND GUIDANCE;  Surgeon: Rolm Bookbinder, MD;  Location: [place];  Service: General;  Laterality: Right;    There were no vitals filed for this visit.   Subjective Assessment - 03/09/21 1102     Subjective Doing alot better.  Still have a little soreness under my arm and at lateral trunk and in pectoral muscle but its not real noticeable.  Had an echocardiogram yeserday and it went well, but it was sore when she was under my breast.  Have done the MLD but missed a few days. Will be out of town at [facility] next week.   Pertinent History Pt with prior history of right breast cancer  with  with surgery on 07/17/17 for right lumpectomy and SLNB with 5 LN removed.  Had chemo and radiation. Most recent surgery1/25/22 for left breast lumpectomy with 0/5 positive nodes. She will have chemo, radiation and infusions for immunotherapy.    Currently in Pain? No/denies    Pain Score  0-No pain    Multiple Pain Sites No                [facility] PT Assessment - 03/09/21 0001       Assessment   Medical Diagnosis Left Breast Cancer    Referring Provider (PT) Dr. Jana Hakim    Onset Date/Surgical Date 07/13/20    Hand Dominance Right      AROM   Left Shoulder Extension 55 Degrees    Left Shoulder Flexion 163 Degrees    Left Shoulder ABduction 180 Degrees    Left Shoulder External Rotation 92 Degrees                           [facility] Adult PT Treatment/Exercise - 03/09/21 0001       Shoulder Exercises: Standing   Other Standing Exercises Quadruped lat  stretch on left    Other Standing Exercises diagonal lat stretch x 3.      Shoulder Exercises: Pulleys   Flexion 2 minutes    ABduction 2 minutes      Shoulder Exercises: Therapy Ball   Flexion Both;10 reps    ABduction Left;10 reps      Manual Therapy   Edema Management made 2 larger foam pads for under band of compression bra and placed in stockinette    Soft tissue mobilization soft tissue work to left pectoralis, lats, serratus, and scapular area    Passive ROM PROM left shoulder flex, scaption, abd, D2 flex                          PT Long Term Goals - 02/10/21 1706       PT LONG TERM GOAL #1   Title Patient will demonstrate she has returned to baseline since surgery related to shoulder ROM and function.    Time 6    Period Weeks    Target Date 03/24/21      PT LONG TERM GOAL #2   Title Pt will be independent in left breast MLD    Time 6    Status New    Target Date 03/24/21      PT LONG TERM GOAL #3   Title Pt will report decreased breast heaviness by 50% or greater    Time 6    Period Weeks    Status New    Target Date 03/24/21                   Plan - 03/09/21 1111     Clinical Impression Statement Therapy consisted of AAROM, stretching at wall with physioball rolls, soft tissue work and PROM.  Left shoulder AROM has improved in all directions but is still mildly limited with flexion and shoulder extension.  She is still very tender in her lats and serratus and mildly in left scapular area.  Pt will be out of town next week and will schedule 1 visit for when she returns.    Personal Factors and Comorbidities Comorbidity 2    Comorbidities Hx of bilateral breast CA with new onset of left Breast CA. ( right in 2019), lymphedema risk    Stability/Clinical Decision Making Stable/Uncomplicated    Rehab Potential Excellent    PT Frequency 2x / week    PT Duration 6 weeks    PT Treatment/Interventions ADLs/Self Care Home  Management;Therapeutic exercise;Patient/family education;Manual techniques;Manual lymph drainage;Passive range of motion    PT  Next Visit Plan Cont SOZO screens, AAROM/pulleys/PROM, left breast MLD and review with pt,, STM to pecs/lats, pec stretches, pulleys, ball    PT Home Exercise Plan Supine wand, star gazer, abduction wall slides, wear compression bra    Consulted and Agree with Plan of Care Patient             Patient will benefit from skilled therapeutic intervention in order to improve the following deficits and impairments:  Postural dysfunction, Decreased knowledge of precautions, Increased edema, Decreased range of motion, Decreased strength, Pain  Visit Diagnosis: Malignant neoplasm of upper-outer quadrant of left female breast, unspecified estrogen receptor status ([place])  Aftercare following surgery for neoplasm  Edema, unspecified type  Abnormal posture     Problem List Patient Active Problem List   Diagnosis Date Noted   Age-related osteoporosis without current pathological fracture 09/08/2020   Allergic rhinitis 09/08/2020   Palpitations 09/08/2020   Personal history of malignant neoplasm of breast 09/08/2020   Personal history of transient ischemic attack (TIA), and cerebral infarction without residual deficits 09/08/2020   Pure hypercholesterolemia 09/08/2020   Vitamin D deficiency 09/08/2020   Neuropathy due to chemotherapeutic drug ([place]) 07/23/2018   Port-A-Cath in place 08/28/2017   TIA (transient ischemic attack) 08/09/2017   Hypertension    Genetic testing 07/06/2017   Family history of ovarian cancer    Family history of breast cancer    Malignant neoplasm of upper-outer quadrant of right breast in female, estrogen receptor negative ([place]) 06/26/2017   Malignant neoplasm of left breast, estrogen receptor positive ([place]) 05/19/2017    Claris Pong, PT 03/09/2021, 12:09 PM  [place], [place], [address] Phone: [phone]   Fax:  [phone]  Name: April [name] MRN: [id number] Date of Birth: Jan 11, 1951

## 2021-03-10 ENCOUNTER — Other Ambulatory Visit: Payer: Self-pay

## 2021-03-10 ENCOUNTER — Ambulatory Visit: Payer: Medicare PPO

## 2021-03-10 DIAGNOSIS — C50412 Malignant neoplasm of upper-outer quadrant of left female breast: Secondary | ICD-10-CM

## 2021-03-10 DIAGNOSIS — Z483 Aftercare following surgery for neoplasm: Secondary | ICD-10-CM | POA: Diagnosis not present

## 2021-03-10 DIAGNOSIS — R293 Abnormal posture: Secondary | ICD-10-CM

## 2021-03-10 DIAGNOSIS — R609 Edema, unspecified: Secondary | ICD-10-CM

## 2021-03-10 NOTE — Therapy (Signed)
[place], [place], [address] Phone: [phone]   Fax:  [phone]  Physical Therapy Treatment  Patient Details  Name: April [name] MRN: [id number] Date of Birth: 1950/09/21 Referring Provider (PT): Dr. Jana Hakim   Encounter Date: 03/10/2021   PT End of Session - 03/10/21 1008     Visit Number 5    Number of Visits 12    Date for PT Re-Evaluation 03/24/21    Authorization Type [employer]    Authorization - Number of Visits 12    PT Start Time 1740    PT Stop Time 1058    PT Time Calculation (min) 56 min    Activity Tolerance Patient tolerated treatment well    Behavior During Therapy [facility] for tasks assessed/performed             Past Medical History:  Diagnosis Date   Breast cancer ([place]) 05/2017   right breast   Eczema    Family history of breast cancer    Family history of ovarian cancer    History of radiation therapy 10/17/17- 11/14/17   40.05 directed to the right breast in 15 fractions, followed by a boost of 10 gy given in 5 fractions.    Hypertension    toxemia during pregnancy, no meds now    Past Surgical History:  Procedure Laterality Date   ABDOMINAL HYSTERECTOMY     BREAST LUMPECTOMY WITH RADIOACTIVE SEED AND SENTINEL LYMPH NODE BIOPSY Right 07/17/2017   Procedure: BREAST LUMPECTOMY WITH RADIOACTIVE SEED AND SENTINEL LYMPH NODE BIOPSY;  Surgeon: Rolm Bookbinder, MD;  Location: [place];  Service: General;  Laterality: Right;   BREAST LUMPECTOMY WITH RADIOACTIVE SEED AND SENTINEL LYMPH NODE BIOPSY Left 07/13/2020   Procedure: LEFT BREAST LUMPECTOMY WITH RADIOACTIVE SEED AND LEFT AXILLARY SENTINEL LYMPH NODE BIOPSY;  Surgeon: Rolm Bookbinder, MD;  Location: [place];  Service: General;  Laterality: Left;   CESAREAN SECTION     x4   DILATION AND CURETTAGE OF UTERUS     KNEE SURGERY Right    PORT-A-CATH REMOVAL Right 09/04/2018    Procedure: REMOVAL PORT-A-CATH;  Surgeon: Rolm Bookbinder, MD;  Location: [place];  Service: General;  Laterality: Right;   PORTACATH PLACEMENT N/A 07/17/2017   Procedure: INSERTION PORT-A-CATH WITH [place];  Surgeon: Rolm Bookbinder, MD;  Location: [place];  Service: General;  Laterality: N/A;   PORTACATH PLACEMENT Right 07/13/2020   Procedure: INSERTION PORT-A-CATH WITH ULTRASOUND GUIDANCE;  Surgeon: Rolm Bookbinder, MD;  Location: [place];  Service: General;  Laterality: Right;    There were no vitals filed for this visit.   Subjective Assessment - 03/10/21 1004     Subjective I did well after last visit.  I did exercise some this am. I did the quadruped quad stretch and at the sink stretching    Pertinent History Pt with prior history of right breast cancer  with  with surgery on 07/17/17 for right lumpectomy and SLNB with 5 LN removed.  Had chemo and radiation. Most recent surgery1/25/22 for left breast lumpectomy with 0/5 positive nodes. She will have chemo, radiation and infusions for immunotherapy.    Currently in Pain? No/denies    Pain Score 0-No pain    Pain Orientation Left    Multiple Pain Sites No  [facility] Adult PT Treatment/Exercise - 03/10/21 0001       Shoulder Exercises: Pulleys   Flexion 1 minute      ABduction 1 minute                          PT Long Term Goals - 02/10/21 1706       PT LONG TERM GOAL #1   Title Patient will demonstrate she has returned to baseline since surgery related to shoulder ROM and function.    Time 6    Period Weeks    Target Date 03/24/21      PT LONG TERM GOAL #2   Title Pt will be independent in left breast MLD    Time 6    Status New    Target Date 03/24/21      PT LONG TERM GOAL #3   Title Pt will report decreased breast heaviness by 50% or greater    Time 6    Period Weeks    Status New    Target  Date 03/24/21                   Plan - 03/10/21 1010     Clinical Impression Statement Pt warmed up on pulleys and was then taken through the ABC strength program from stretches to page 20 (standing rows).  She performed exs x 10 reps and where resistance was used did 2#.  She had significant trouble with mini squat form, but did improve when done with high table behind her. We also practiced hip hinge multiple times so she could use better form with upper body exercises and keeping her back in neutral.  She will practice this at home.    Personal Factors and Comorbidities Comorbidity 2    Comorbidities Hx of bilateral breast CA with new onset of left Breast CA. ( right in 2019), lymphedema risk    Stability/Clinical Decision Making Stable/Uncomplicated    Rehab Potential Excellent    PT Frequency 2x / week    PT Duration 6 weeks    PT Treatment/Interventions ADLs/Self Care Home Management;Therapeutic exercise;Patient/family education;Manual techniques;Manual lymph drainage;Passive range of motion    PT Next Visit Plan Cont SOZO screens, ABC strength 21-26, Hip hinge, mini squats,AAROM/pulleys/PROM, left breast MLD and review with pt,, STM to pecs/lats, pec stretches, pulleys, ball    PT Home Exercise Plan Supine wand, star gazer, abduction wall slides, wear compression bra    Consulted and Agree with Plan of Care Patient             Patient will benefit from skilled therapeutic intervention in order to improve the following deficits and impairments:  Postural dysfunction, Decreased knowledge of precautions, Increased edema, Decreased range of motion, Decreased strength, Pain  Visit Diagnosis: Malignant neoplasm of upper-outer quadrant of left female breast, unspecified estrogen receptor status ([place])  Aftercare following surgery for neoplasm  Edema, unspecified type  Abnormal posture     Problem List Patient Active Problem List   Diagnosis Date Noted   Age-related  osteoporosis without current pathological fracture 09/08/2020   Allergic rhinitis 09/08/2020   Palpitations 09/08/2020   Personal history of malignant neoplasm of breast 09/08/2020   Personal history of transient ischemic attack (TIA), and cerebral infarction without residual deficits 09/08/2020   Pure hypercholesterolemia 09/08/2020   Vitamin D deficiency 09/08/2020   Neuropathy due to chemotherapeutic drug ([place]) 07/23/2018   Port-A-Cath in place 08/28/2017   TIA (  transient ischemic attack) 08/09/2017   Hypertension    Genetic testing 07/06/2017   Family history of ovarian cancer    Family history of breast cancer    Malignant neoplasm of upper-outer quadrant of right breast in female, estrogen receptor negative ([place]) 06/26/2017   Malignant neoplasm of left breast, estrogen receptor positive ([place]) 05/19/2017    Claris Pong, PT 03/10/2021, 12:27 PM  [place], [place], [address] Phone: [phone]   Fax:  [phone]  Name: April [name] MRN: [id number] Date of Birth: 09-11-1950

## 2021-03-11 ENCOUNTER — Other Ambulatory Visit (HOSPITAL_COMMUNITY): Payer: Self-pay | Admitting: *Deleted

## 2021-03-11 MED ORDER — HYDROCHLOROTHIAZIDE 12.5 MG PO CAPS: ORAL_CAPSULE | ORAL | 3 refills | Status: DC

## 2021-03-21 ENCOUNTER — Other Ambulatory Visit: Payer: Self-pay | Admitting: *Deleted

## 2021-03-21 ENCOUNTER — Encounter: Payer: Self-pay | Admitting: *Deleted

## 2021-03-21 DIAGNOSIS — Z171 Estrogen receptor negative status [ER-]: Secondary | ICD-10-CM

## 2021-03-21 DIAGNOSIS — C50411 Malignant neoplasm of upper-outer quadrant of right female breast: Secondary | ICD-10-CM

## 2021-03-22 ENCOUNTER — Inpatient Hospital Stay (HOSPITAL_BASED_OUTPATIENT_CLINIC_OR_DEPARTMENT_OTHER): Payer: Medicare PPO | Admitting: Oncology

## 2021-03-22 ENCOUNTER — Other Ambulatory Visit: Payer: Medicare PPO

## 2021-03-22 ENCOUNTER — Other Ambulatory Visit: Payer: Self-pay

## 2021-03-22 ENCOUNTER — Inpatient Hospital Stay: Payer: Medicare PPO

## 2021-03-22 ENCOUNTER — Inpatient Hospital Stay: Payer: Medicare PPO | Attending: Oncology

## 2021-03-22 VITALS — BP 175/86 | HR 66 | Temp 97.7°F | Wt 155.6 lb

## 2021-03-22 DIAGNOSIS — Z803 Family history of malignant neoplasm of breast: Secondary | ICD-10-CM | POA: Diagnosis not present

## 2021-03-22 DIAGNOSIS — Z882 Allergy status to sulfonamides status: Secondary | ICD-10-CM | POA: Diagnosis not present

## 2021-03-22 DIAGNOSIS — C50411 Malignant neoplasm of upper-outer quadrant of right female breast: Secondary | ICD-10-CM

## 2021-03-22 DIAGNOSIS — Z95828 Presence of other vascular implants and grafts: Secondary | ICD-10-CM

## 2021-03-22 DIAGNOSIS — Z8042 Family history of malignant neoplasm of prostate: Secondary | ICD-10-CM | POA: Diagnosis not present

## 2021-03-22 DIAGNOSIS — Z5112 Encounter for antineoplastic immunotherapy: Secondary | ICD-10-CM | POA: Insufficient documentation

## 2021-03-22 DIAGNOSIS — Z8249 Family history of ischemic heart disease and other diseases of the circulatory system: Secondary | ICD-10-CM | POA: Diagnosis not present

## 2021-03-22 DIAGNOSIS — C50012 Malignant neoplasm of nipple and areola, left female breast: Secondary | ICD-10-CM | POA: Diagnosis not present

## 2021-03-22 DIAGNOSIS — Z171 Estrogen receptor negative status [ER-]: Secondary | ICD-10-CM | POA: Insufficient documentation

## 2021-03-22 DIAGNOSIS — Z8041 Family history of malignant neoplasm of ovary: Secondary | ICD-10-CM | POA: Diagnosis not present

## 2021-03-22 DIAGNOSIS — Z923 Personal history of irradiation: Secondary | ICD-10-CM | POA: Insufficient documentation

## 2021-03-22 DIAGNOSIS — Z886 Allergy status to analgesic agent status: Secondary | ICD-10-CM | POA: Insufficient documentation

## 2021-03-22 DIAGNOSIS — I10 Essential (primary) hypertension: Secondary | ICD-10-CM | POA: Diagnosis not present

## 2021-03-22 DIAGNOSIS — Z79811 Long term (current) use of aromatase inhibitors: Secondary | ICD-10-CM | POA: Insufficient documentation

## 2021-03-22 DIAGNOSIS — Z79899 Other long term (current) drug therapy: Secondary | ICD-10-CM | POA: Diagnosis not present

## 2021-03-22 DIAGNOSIS — I071 Rheumatic tricuspid insufficiency: Secondary | ICD-10-CM | POA: Diagnosis not present

## 2021-03-22 DIAGNOSIS — Z888 Allergy status to other drugs, medicaments and biological substances status: Secondary | ICD-10-CM | POA: Diagnosis not present

## 2021-03-22 DIAGNOSIS — Z170 Estrogen receptor positive status [ER+]: Secondary | ICD-10-CM | POA: Diagnosis not present

## 2021-03-22 LAB — CMP (CANCER CENTER ONLY)
ALT: 32 U/L (ref 0–44)
AST: 29 U/L (ref 15–41)
Albumin: 3.8 g/dL (ref 3.5–5.0)
Alkaline Phosphatase: 71 U/L (ref 38–126)
Anion gap: 10 (ref 5–15)
BUN: 13 mg/dL (ref 8–23)
CO2: 25 mmol/L (ref 22–32)
Calcium: 9.7 mg/dL (ref 8.9–10.3)
Chloride: 104 mmol/L (ref 98–111)
Creatinine: 1.03 mg/dL — ABNORMAL HIGH (ref 0.44–1.00)
GFR, Estimated: 58 mL/min — ABNORMAL LOW (ref >60)
Glucose, Bld: 121 mg/dL — ABNORMAL HIGH (ref 70–99)
Potassium: 3.7 mmol/L (ref 3.5–5.1)
Sodium: 139 mmol/L (ref 135–145)
Total Bilirubin: 0.3 mg/dL (ref 0.3–1.2)
Total Protein: 7.4 g/dL (ref 6.5–8.1)

## 2021-03-22 LAB — CBC WITH DIFFERENTIAL (CANCER CENTER ONLY)
Abs Immature Granulocytes: 0 10*3/uL (ref 0.00–0.07)
Basophils Absolute: 0 10*3/uL (ref 0.0–0.1)
Basophils Relative: 1 %
Eosinophils Absolute: 0.1 10*3/uL (ref 0.0–0.5)
Eosinophils Relative: 4 %
HCT: 32.4 % — ABNORMAL LOW (ref 36.0–46.0)
Hemoglobin: 10.6 g/dL — ABNORMAL LOW (ref 12.0–15.0)
Immature Granulocytes: 0 %
Lymphocytes Relative: 30 %
Lymphs Abs: 0.9 10*3/uL (ref 0.7–4.0)
MCH: 29.9 pg (ref 26.0–34.0)
MCHC: 32.7 g/dL (ref 30.0–36.0)
MCV: 91.5 fL (ref 80.0–100.0)
Monocytes Absolute: 0.3 10*3/uL (ref 0.1–1.0)
Monocytes Relative: 11 %
Neutro Abs: 1.6 10*3/uL — ABNORMAL LOW (ref 1.7–7.7)
Neutrophils Relative %: 54 %
Platelet Count: 214 10*3/uL (ref 150–400)
RBC: 3.54 MIL/uL — ABNORMAL LOW (ref 3.87–5.11)
RDW: 12.6 % (ref 11.5–15.5)
WBC Count: 2.9 10*3/uL — ABNORMAL LOW (ref 4.0–10.5)
nRBC: 0 % (ref 0.0–0.2)

## 2021-03-22 MED ORDER — DIPHENHYDRAMINE HCL 25 MG PO CAPS
25.0000 mg | ORAL_CAPSULE | Freq: Once | ORAL | Status: AC
  Administered 2021-03-22: 25 mg via ORAL
  Filled 2021-03-22: qty 1

## 2021-03-22 MED ORDER — SODIUM CHLORIDE 0.9 % IV SOLN: Freq: Once | INTRAVENOUS | Status: AC

## 2021-03-22 MED ORDER — SODIUM CHLORIDE 0.9% FLUSH
10.0000 mL | INTRAVENOUS | Status: DC | PRN
Start: 2021-03-22 — End: 2021-03-22
  Administered 2021-03-22: 10 mL

## 2021-03-22 MED ORDER — HEPARIN SOD (PORK) LOCK FLUSH 100 UNIT/ML IV SOLN
500.0000 [IU] | Freq: Once | INTRAVENOUS | Status: AC | PRN
  Administered 2021-03-22: 500 [IU]

## 2021-03-22 MED ORDER — ACETAMINOPHEN 325 MG PO TABS
650.0000 mg | ORAL_TABLET | Freq: Once | ORAL | Status: AC
  Administered 2021-03-22: 650 mg via ORAL
  Filled 2021-03-22: qty 2

## 2021-03-22 MED ORDER — TRASTUZUMAB-ANNS CHEMO 150 MG IV SOLR
6.0000 mg/kg | Freq: Once | INTRAVENOUS | Status: AC
  Administered 2021-03-22: 420 mg via INTRAVENOUS
  Filled 2021-03-22: qty 20

## 2021-03-22 MED ORDER — ANASTROZOLE 1 MG PO TABS: 1.0000 mg | ORAL_TABLET | Freq: Every day | ORAL | 4 refills | Status: DC

## 2021-03-22 MED ORDER — SODIUM CHLORIDE 0.9% FLUSH
10.0000 mL | Freq: Once | INTRAVENOUS | Status: AC
Start: 2021-03-22 — End: 2021-03-22
  Administered 2021-03-22: 10 mL

## 2021-03-22 NOTE — Progress Notes (Signed)
[place]  [phone] [phone]     ID: April [name] DOB: 1950/11/24  MR#: [id number]  BHA#:[id number]  Patient Care Team: Kelton Pillar, MD as PCP - General (Family Medicine) Talissa Apple, Virgie Dad, MD as Consulting Physician (Oncology) Rolm Bookbinder, MD as Consulting Physician (General Surgery) Eppie Gibson, MD as Attending Physician (Radiation Oncology) Newt Minion, MD as Consulting Physician (Orthopedic Surgery) Regal, Tamala Fothergill, DPM as Consulting Physician (Podiatry) Neldon Mc, Donnamarie Poag, MD as Consulting Physician (Allergy and Immunology) Mauro Kaufmann, RN as Oncology Nurse Navigator Rockwell Germany, RN as Oncology Nurse Navigator Bensimhon, Shaune Pascal, MD as Consulting Physician (Cardiology) OTHER MD:  CHIEF COMPLAINT: Estrogen receptor negative breast cancer  CURRENT TREATMENT: continuing immunotherapy/ considering letrozole   INTERVAL HISTORY: April [name] returns today for follow up and treatment of her new left breast cancer.  Her husband who usually accompanies her was not able to be with her today  She continues on trastuzumab every 3 weeks.  She has had no side effects from this that she is aware of  Since her last visit, she underwent repeat echocardiogram on 03/08/2021 showing an ejection fraction of 65%.   REVIEW OF SYSTEMS: April [name] was at [facility] when EN struck.  She made at home without difficulty and did not lose her electricity.  She does not feel particularly fatigued, short of breath, like she might faint, and she is not having palpitation or chest pain or pressure.  A detailed review of systems today was otherwise stable   COVID 19 VACCINATION STATUS: fully vaccinated [employer]), with booster 01/2020   LEFT BREAST CANCER HISTORY: From the original intake note:  She underwent bilateral diagnostic mammography with tomography at [facility] on 06/15/2020 showing: breast density category C; this showed in the right  breast evidence of postsurgical and radiation changes but this was all stable.  On the left however there was a new group of pleomorphic calcifications in the upper inner quadrant..  She proceeded to biopsy of the left breast area in question on 06/15/2020. Pathology from the procedure ([id number]) showed: invasive ductal carcinoma, grade 3; high-grade ductal carcinoma in situ with necrosis; lymphovascular invasion identified. Prognostic indicators significant for: estrogen receptor 40% positive with weak staining intensity; progesterone receptor 0% negative. Proliferation marker Ki67 of 45%. Her2 positive by immunohistochemistry (3+).   RIGHT BREAST CANCER HISTORY: From the original intake note:  "April [name]" had bilateral screening mammography at [facility] 06/06/2017.  This showed a possible mass in the right breast at the 11 o'clock position.  On 06/13/2017 she underwent right diagnostic mammography and ultrasonography.  Breast density was category C.  In the right breast at the 11 o'clock position there was a 1 cm area by mammography.  By ultrasound this confirmed a 1.0 cm irregular mass with lobulated margins in the upper outer quadrant of the right breast.  There was a second, 0.4 cm lobulated mass in the same quadrant.  The right axilla was sonographically benign.  On 06/20/2017 biopsy of the 2 right breast masses in question was performed.  The final pathology ([id number]) found the smaller mass to be only fibrocystic change.  This is felt to be concordant.  The larger mass however was an invasive ductal carcinoma, grade 3, estrogen and progesterone receptor negative, with an MIB-1 of 30%, and HER-2 amplified, with a signals ratio of 2.24.  The number per cell was 4.60.  The patient's subsequent history is as detailed below.   PAST MEDICAL HISTORY: Past Medical  History:  Diagnosis Date   Breast cancer ([place]) 05/2017   right breast   Eczema    Family history of breast cancer    Family history of  ovarian cancer    History of radiation therapy 10/17/17- 11/14/17   40.05 directed to the right breast in 15 fractions, followed by a boost of 10 gy given in 5 fractions.    Hypertension    toxemia during pregnancy, no meds now    PAST SURGICAL HISTORY: Past Surgical History:  Procedure Laterality Date   ABDOMINAL HYSTERECTOMY     BREAST LUMPECTOMY WITH RADIOACTIVE SEED AND SENTINEL LYMPH NODE BIOPSY Right 07/17/2017   Procedure: BREAST LUMPECTOMY WITH RADIOACTIVE SEED AND SENTINEL LYMPH NODE BIOPSY;  Surgeon: Rolm Bookbinder, MD;  Location: [place];  Service: General;  Laterality: Right;   BREAST LUMPECTOMY WITH RADIOACTIVE SEED AND SENTINEL LYMPH NODE BIOPSY Left 07/13/2020   Procedure: LEFT BREAST LUMPECTOMY WITH RADIOACTIVE SEED AND LEFT AXILLARY SENTINEL LYMPH NODE BIOPSY;  Surgeon: Rolm Bookbinder, MD;  Location: [place];  Service: General;  Laterality: Left;   CESAREAN SECTION     x4   DILATION AND CURETTAGE OF UTERUS     KNEE SURGERY Right    PORT-A-CATH REMOVAL Right 09/04/2018   Procedure: REMOVAL PORT-A-CATH;  Surgeon: Rolm Bookbinder, MD;  Location: [place];  Service: General;  Laterality: Right;   PORTACATH PLACEMENT N/A 07/17/2017   Procedure: INSERTION PORT-A-CATH WITH [place];  Surgeon: Rolm Bookbinder, MD;  Location: [place];  Service: General;  Laterality: N/A;   PORTACATH PLACEMENT Right 07/13/2020   Procedure: INSERTION PORT-A-CATH WITH ULTRASOUND GUIDANCE;  Surgeon: Rolm Bookbinder, MD;  Location: [place];  Service: General;  Laterality: Right;    FAMILY HISTORY Family History  Problem Relation Age of Onset   Breast cancer Mother 12       again at 82 in other breast    Heart attack Father 48   Breast cancer Sister 28   Breast cancer Maternal Grandmother 38       spread to lungs, died at 93   Ovarian cancer Cousin 56   Prostate cancer Cousin   The patient's father died  at age 56 from a heart attack.  The patient's mother is currently living at age 10 (as of January 2019).  The patient had 1 sister who was diagnosed with breast cancer at age 41 and died from metastatic disease at age 37.  The patient has 1 brother.  In addition the patient's mother was diagnosed with breast cancer at age 97, on the left side, and now has a right-sided breast cancer diagnosed in January 2019.  There is in addition a cousin with ovarian cancer diagnosed when she was 71 years old   GYNECOLOGIC HISTORY:  No LMP recorded. Patient has had a hysterectomy. Menarche age 9, first live birth age 50, the patient had 3 live births, 1 of whom survived only 2 days.  She underwent hysterectomy without salpingo-oophorectomy September 05, 1978.  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  Her husband April [name] his Theme park manager at [employer].  The patient's daughter April [name] lives in [place] where she is an Tourist information centre manager.  The patient's daughter April [name] lives in [place] working for the department of defense.  The patient  has 3 grandchildren, 1 of whom is in the seventh grade but is actually the captain of the eighth grade basketball team and is playing in a championship February 2020.   ADVANCED DIRECTIVES: Not in place   HEALTH MAINTENANCE: Social History   Tobacco Use   Smoking status: Never   Smokeless tobacco: Never  Vaping Use   Vaping Use: Never used  Substance Use Topics   Alcohol use: Yes    Comment: social   Drug use: No     Colonoscopy: April 2018/[facility]  PAP: Status post hysterectomy  Bone density:   Allergies  Allergen Reactions   Tape Itching and Rash   Betamethasone Dipropionate Other (See Comments)   Cetirizine Hcl Other (See Comments)   Lisinopril Other (See Comments)   Loratadine Other  (See Comments)   Nsaids     Stomach issue   Other Other (See Comments)   Statins Other (See Comments)   Sulfa Antibiotics Rash    Current Outpatient Medications  Medication Sig Dispense Refill   anastrozole (ARIMIDEX) 1 MG tablet Take 1 tablet (1 mg total) by mouth daily. 90 tablet 4   aspirin 81 MG chewable tablet Chew 81 mg by mouth daily.     Calcium-Vitamin D-Vitamin K (CALCIUM + D) [phone] MG-UNT-MCG CHEW      co-enzyme Q-10 30 MG capsule Take 30 mg by mouth daily.     hydrochlorothiazide (MICROZIDE) 12.5 MG capsule TAKE 1 CAPSULE BY MOUTH EVERY DAY 90 capsule 3   lidocaine-prilocaine (EMLA) cream Apply to affected area once 30 g 3   magnesium citrate SOLN Take 1/2 bottle now; if no results after 2 hours take rest of the bottle 296 mL 0   Omega-3 Fatty Acids (FISH OIL) 1000 MG CAPS Take 1 capsule by mouth daily.     polyethylene glycol powder (MIRALAX) 17 GM/SCOOP powder Take 17 g by mouth daily. 255 g 0   Red Yeast Rice Extract (RED YEAST RICE PO) Take by mouth.     senna (SENOKOT) 8.6 MG TABS tablet Take 2 tablets (17.2 mg total) by mouth in the morning and at bedtime. 120 tablet 0   vitamin B-12 (CYANOCOBALAMIN) 1000 MCG tablet Take 1,000 mcg by mouth daily.     vitamin C (ASCORBIC ACID) 250 MG tablet Take 250 mg by mouth daily.     No current facility-administered medications for this visit.   Facility-Administered Medications Ordered in Other Visits  Medication Dose Route Frequency Provider Last Rate Last Admin   sodium chloride flush (NS) 0.9 % injection 10 mL  10 mL Intracatheter PRN Nurah Petrides, Virgie Dad, MD   10 mL at 03/22/21 1509    OBJECTIVE: African-American woman in no acute distress  Vitals:   03/22/21 1317  BP: (!) 175/86  Pulse: 66  Temp: 97.7 F (36.5 C)  SpO2: 100%      Body mass index is 27.13 kg/m.    Wt Readings from Last 3 Encounters:  03/22/21 155 lb 9.6 oz (70.6 kg)  03/01/21 151 lb 6.4 oz (68.7 kg)  02/08/21 154 lb (69.9 kg)  ECOG FS:1 -  Symptomatic but completely ambulatory   Sclerae unicteric, EOMs intact Wearing a mask No cervical or supraclavicular adenopathy Lungs no rales or rhonchi Heart regular rate and rhythm Abd soft, nontender, positive bowel sounds MSK no focal spinal tenderness, no upper extremity lymphedema Neuro: nonfocal, well oriented, appropriate affect Breasts: Deferred   LAB RESULTS:  CMP     Component Value Date/Time  NA 139 03/22/2021 1239   K 3.7 03/22/2021 1239   CL 104 03/22/2021 1239   CO2 25 03/22/2021 1239   GLUCOSE 121 (H) 03/22/2021 1239   BUN 13 03/22/2021 1239   CREATININE 1.03 (H) 03/22/2021 1239   CALCIUM 9.7 03/22/2021 1239   PROT 7.4 03/22/2021 1239   ALBUMIN 3.8 03/22/2021 1239   AST 29 03/22/2021 1239   ALT 32 03/22/2021 1239   ALKPHOS 71 03/22/2021 1239   BILITOT 0.3 03/22/2021 1239   GFRNONAA 58 (L) 03/22/2021 1239   GFRAA >60 07/10/2019 1008   GFRAA >60 06/27/2017 0843    No results found for: TOTALPROTELP, ALBUMINELP, A1GS, A2GS, BETS, BETA2SER, GAMS, MSPIKE, SPEI  No results found for: KPAFRELGTCHN, LAMBDASER, KAPLAMBRATIO  Lab Results  Component Value Date   WBC 2.9 (L) 03/22/2021   NEUTROABS 1.6 (L) 03/22/2021   HGB 10.6 (L) 03/22/2021   HCT 32.4 (L) 03/22/2021   MCV 91.5 03/22/2021   PLT 214 03/22/2021   No results found for: LABCA2  No components found for: [id number]  No results for input(s): INR in the last 168 hours.  No results found for: LABCA2  No results found for: SMO707  No results found for: EML544  No results found for: BEE100  No results found for: CA2729  No components found for: HGQUANT  No results found for: CEA1 / No results found for: CEA1   No results found for: AFPTUMOR  No results found for: CHROMOGRNA  No results found for: HGBA, HGBA2QUANT, HGBFQUANT, HGBSQUAN (Hemoglobinopathy evaluation)   No results found for: LDH  Lab Results  Component Value Date   IRON 77 05/21/2018   TIBC 404 05/21/2018    IRONPCTSAT 19 (L) 05/21/2018   (Iron and TIBC)  Lab Results  Component Value Date   FERRITIN 24 05/21/2018    Urinalysis    Component Value Date/Time   COLORURINE COLORLESS (A) 08/09/2017 2224   APPEARANCEUR CLEAR 08/09/2017 2224   LABSPEC 1.004 (L) 08/09/2017 2224   PHURINE 6.0 08/09/2017 2224   GLUCOSEU NEGATIVE 08/09/2017 2224   HGBUR NEGATIVE 08/09/2017 2224   BILIRUBINUR NEGATIVE 08/09/2017 2224   KETONESUR NEGATIVE 08/09/2017 2224   PROTEINUR NEGATIVE 08/09/2017 2224   NITRITE NEGATIVE 08/09/2017 2224   LEUKOCYTESUR NEGATIVE 08/09/2017 2224    STUDIES: ECHOCARDIOGRAM COMPLETE  Result Date: 03/08/2021    ECHOCARDIOGRAM REPORT   Patient Name:   JALYAH WEINHEIMER Date of Exam: 03/08/2021 Medical Rec #:  [id number]           Height:       63.5 in Accession #:    [id number]          Weight:       151.4 lb Date of Birth:  1951/05/19           BSA:          1.728 m Patient Age:    56 years            BP:           140/86 mmHg Patient Gender: F                   HR:           61 bpm. Exam Location:  [facility] Procedure: 2D Echo, 3D Echo, Cardiac Doppler, Color Doppler and Strain Analysis Indications:    Z09 Chemotherapy  History:        Patient has prior history of Echocardiogram examinations, most  recent 12/10/2020. Risk Factors:Hypertension and Family History                 of Coronary Artery Disease. Right Breast Cancer (2019 with                 lumpectomy, Chemo and Radiation), Left Breast Cancer (2022 with                 Lumpectomy, Chemo and Radiation).  Sonographer:    Deliah Boston RDCS Referring Phys: Chauncey Cruel IMPRESSIONS  1. Left ventricular ejection fraction by 3D volume is 65 %. The left ventricle has normal function. The left ventricle has no regional wall motion abnormalities. Left ventricular diastolic parameters are consistent with Grade I diastolic dysfunction (impaired relaxation). The average left ventricular global longitudinal strain is  22.1 %. The global longitudinal strain is normal.  2. Right ventricular systolic function is normal. The right ventricular size is normal. There is normal pulmonary artery systolic pressure. The estimated right ventricular systolic pressure is 87.6 mmHg.  3. The mitral valve is normal in structure. Mild mitral valve regurgitation. No evidence of mitral stenosis.  4. The aortic valve is tricuspid. There is moderate calcification of the aortic valve. There is mild thickening of the aortic valve. Aortic valve regurgitation is not visualized. Mild aortic valve sclerosis is present, with no evidence of aortic valve stenosis.  5. The inferior vena cava is normal in size with greater than 50% respiratory variability, suggesting right atrial pressure of 3 mmHg. Comparison(s): No significant change from prior study. Prior images reviewed side by side. Prior GLS -19.3. FINDINGS  Left Ventricle: Left ventricular ejection fraction by 3D volume is 65 %. The left ventricle has normal function. The left ventricle has no regional wall motion abnormalities. The average left ventricular global longitudinal strain is 22.1 %. The global longitudinal strain is normal. The left ventricular internal cavity size was normal in size. There is no left ventricular hypertrophy. Left ventricular diastolic parameters are consistent with Grade I diastolic dysfunction (impaired relaxation). Right Ventricle: The right ventricular size is normal. No increase in right ventricular wall thickness. Right ventricular systolic function is normal. There is normal pulmonary artery systolic pressure. The tricuspid regurgitant velocity is 2.07 m/s, and  with an assumed right atrial pressure of 3 mmHg, the estimated right ventricular systolic pressure is 81.1 mmHg. Left Atrium: Left atrial size was normal in size. Right Atrium: Right atrial size was normal in size. Pericardium: There is no evidence of pericardial effusion. Mitral Valve: The mitral valve is  normal in structure. Mild mitral valve regurgitation. No evidence of mitral valve stenosis. Tricuspid Valve: The tricuspid valve is normal in structure. Tricuspid valve regurgitation is mild . No evidence of tricuspid stenosis. Aortic Valve: The aortic valve is tricuspid. There is moderate calcification of the aortic valve. There is mild thickening of the aortic valve. Aortic valve regurgitation is not visualized. Mild aortic valve sclerosis is present, with no evidence of aortic valve stenosis. Pulmonic Valve: The pulmonic valve was normal in structure. Pulmonic valve regurgitation is not visualized. No evidence of pulmonic stenosis. Aorta: The aortic root is normal in size and structure. Venous: The inferior vena cava is normal in size with greater than 50% respiratory variability, suggesting right atrial pressure of 3 mmHg. IAS/Shunts: No atrial level shunt detected by color flow Doppler.  LEFT VENTRICLE PLAX 2D LVIDd:         4.30 cm  Diastology LVIDs:         2.50 cm         LV e' medial:    7.51 cm/s LV PW:         0.80 cm         LV E/e' medial:  9.1 LV IVS:        0.90 cm         LV e' lateral:   9.03 cm/s LVOT diam:     2.00 cm         LV E/e' lateral: 7.6 LV SV:         80 LV SV Index:   46              2D LVOT Area:     3.14 cm        Longitudinal                                Strain                                2D Strain GLS  21.7 %                                (A2C):                                2D Strain GLS  20.4 %                                (A3C):                                2D Strain GLS  24.2 %                                (A4C):                                2D Strain GLS  22.1 %                                Avg:                                 3D Volume EF                                LV 3D EF:    Left                                             ventricul  ar                                             ejection                                              fraction                                             by 3D                                             volume is                                             65 %.                                 3D Volume EF:                                3D EF:        65 %                                LV EDV:       93 ml                                LV ESV:       33 ml                                LV SV:        60 ml RIGHT VENTRICLE RV S prime:     10.05 cm/s TAPSE (M-mode): 2.3 cm LEFT ATRIUM             Index       RIGHT ATRIUM           Index LA diam:        3.90 cm 2.26 cm/m  RA Area:     11.40 cm LA Vol (A2C):   32.2 ml 18.63 ml/m RA Volume:   21.20 ml  12.27 ml/m LA Vol (A4C):   49.1 ml 28.41 ml/m LA Biplane Vol: 42.5 ml 24.60 ml/m  AORTIC VALVE LVOT Vmax:   108.50 cm/s LVOT Vmean:  70.750 cm/s LVOT VTI:    0.254 m  AORTA Ao Root diam: 3.10 cm Ao Asc diam:  3.10 cm MITRAL VALVE               TRICUSPID VALVE MV Area (PHT): cm  TR Peak grad:   17.1 mmHg MV Decel Time: 196 msec    TR Vmax:        207.00 cm/s MV E velocity: 68.70 cm/s MV A velocity: 75.50 cm/s  SHUNTS MV E/A ratio:  0.91        Systemic VTI:  0.25 m                            Systemic Diam: 2.00 cm Candee Furbish MD Electronically signed by Candee Furbish MD Signature Date/Time: 03/08/2021/1:16:25 PM    Final      ELIGIBLE FOR AVAILABLE RESEARCH PROTOCOL: no   ASSESSMENT: 70 y.o. [place] woman   RIGHT BREAST CANCER (0) status post right breast upper outer quadrant biopsy 06/20/2017 for a clinical T1b N0, stage IA invasive ductal carcinoma, grade 3, estrogen and progesterone receptor negative, but HER-2 amplified, with an MIB-1 of 30%  (1) genetics testing 07/05/2017 through the Common Hereditary Cancer Panel offered by [facility] found no deleterious mutations in APC, ATM, AXIN2, BARD1, BMPR1A, BRCA1, BRCA2, BRIP1, CDH1, CDKN2A (p14ARF), CDKN2A (p16INK4a), CKD4, CHEK2, CTNNA1, DICER1, EPCAM  (Deletion/duplication testing only), GREM1 (promoter region deletion/duplication testing only), KIT, MEN1, MLH1, MSH2, MSH3, MSH6, MUTYH, NBN, NF1, NHTL1, PALB2, PDGFRA, PMS2, POLD1, POLE, PTEN, RAD50, RAD51C, RAD51D, SDHB, SDHC, SDHD, SMAD4, SMARCA4. STK11, TP53, TSC1, TSC2, and VHL.  The following genes were evaluated for sequence changes only: SDHA and HOXB13 c.251G>A variant only.  (a) a Variant of uncertain significance in MSH2 was identified c.1331G>T (p.Arg444Leu).   (2) status post right lumpectomy and sentinel lymph node sampling 07/17/2017 for a pT1c pN0, stage IA invasive ductal carcinoma, grade 2, with negative margins.  A total of 5 lymph nodes were removed  (3) adjuvant chemotherapy consisting of paclitaxel weekly x12 together with trastuzumab every 21 days starting 08/07/2017  (a) paclitaxel stopped after 8 doses because of neuropathy (last dose 09/25/2017  (4) trastuzumab continued to total 1 year (last dose 07/23/2018)  (a) echo 08/10/2017 showed an ejection fraction in the 65-70% range  (b) echo on 11/01/2017 shows EF of 60-65%  (c) cardiogram 02/05/2018 shows an ejection fraction in the 60-65% range.  (d) echocardiogram 05/28/2018 shows an ejection fraction in the 65-70%.  (5) adjuvant radiation 10/17/2017-11/14/2016: 40.05 Gy directed to the Right Breast in 15 fractions, followed by a boost of 10 Gy given in 5 fractions   (6) anemia, with normal MCV, ferritin, B12, folate, and inappropriately normal reticulocyte count, consistent with anemia of chronic illness.  LEFT BREAST CANCER: (7) status post left breast upper inner quadrant biopsy 06/15/2020 for a clinical T1 N0 invasive ductal carcinoma, grade 3, weakly estrogen receptor positive, progesterone receptor negative, HER2 amplified, with an MIB-1 of 45%.  (8) status post left lumpectomy and sentinel lymph node sampling 07/13/2020 for a pT1c pN0, stage IB invasive ductal carcinoma, grade 3, with negative margins  (a) a total of  5 axilla lymph nodes were removed, all benign  (b) repeat prognostic panel again weakly estrogen receptor positive, progesterone receptor negative, now HER-2 not amplified  (9) adjuvant chemotherapy and anti-HER2 immunotherapy started 07/27/2020, consisting of CMF chemotherapy to be repeated every 21 days x 8, completed 12/28/2020, with trastuzumab to be continued for 1 year.  (a) echocardiogram 05/28/2020 shows an ejection fraction in the 65-70% range  (b) echo 08/13/2020 shows an ejection fraction in the 65 to 70% range  (c) cycle 4 of CMF delayed because of neutropenia; full  Phila added with subsequent cycles  (d) methotrexate omitted during radiation over 1 overlap  (10) trastuzumab to be continued to February 2023  (a) most recent echo 12/10/2020 shows an ejection fraction in the 60-65% range  (b) echo 03/08/2021 shows an ejection fraction in the 65% range  (11) adjuvant radiation given concurrently with CMF and completed 10/19/2020  (12) started anastrozole    PLAN: April [name] is finally ready to start anastrozole.  She has been reading up on the complications side effects and toxicities and understands that this might affect her cholesterol.  However she is already on red yeast rice and usually that overrides that problem, which is generally mild.  She is going to be having her cholesterol rechecked in any case in about a month or month and a half and that we will give [place] an idea if there is any problem  Otherwise that she is can let us know if hot flashes or vaginal dryness which are more common problems become more of an issue.  She will continue to receive Herceptin today and every 3 weeks until she completes her year of adjuvant treatment and she will see me again in 6 weeks.  She knows to call for any other issue that may develop before then.  Total encounter time 25 minutes.*   Nehemiah Mcfarren, Virgie Dad, MD  03/22/21 6:08 PM Medical Oncology and Hematology [facility] [place], [place] [address] Tel. [phone]    Fax. [phone]   I, Wilburn Mylar, am acting as scribe for Dr. Virgie Dad. April [name].  I, Lurline Del MD, have reviewed the above documentation for accuracy and completeness, and I agree with the above.   *Total Encounter Time as defined by the Centers for Medicare and Medicaid Services includes, in addition to the face-to-face time of a patient visit (documented in the note above) non-face-to-face time: obtaining and reviewing outside history, ordering and reviewing medications, tests or procedures, care coordination (communications with other health care professionals or caregivers) and documentation in the medical record.

## 2021-03-22 NOTE — Patient Instructions (Signed)
[facility]  Discharge Instructions: Thank you for choosing [facility] to provide your oncology and hematology care.   If you have a lab appointment with the Cancer Center, please go directly to the Cancer Center and check in at the registration area.   Wear comfortable clothing and clothing appropriate for easy access to any Portacath or PICC line.   We strive to give you quality time with your provider. You may need to reschedule your appointment if you arrive late (15 or more minutes).  Arriving late affects you and other patients whose appointments are after yours.  Also, if you miss three or more appointments without notifying the office, you may be dismissed from the clinic at the provider's discretion.      For prescription refill requests, have your pharmacy contact our office and allow 72 hours for refills to be completed.    Today you received the following chemotherapy and/or immunotherapy agents : Herceptin     To help prevent nausea and vomiting after your treatment, we encourage you to take your nausea medication as directed.  BELOW ARE SYMPTOMS THAT SHOULD BE REPORTED IMMEDIATELY: *FEVER GREATER THAN 100.4 F (38 C) OR HIGHER *CHILLS OR SWEATING *NAUSEA AND VOMITING THAT IS NOT CONTROLLED WITH YOUR NAUSEA MEDICATION *UNUSUAL SHORTNESS OF BREATH *UNUSUAL BRUISING OR BLEEDING *URINARY PROBLEMS (pain or burning when urinating, or frequent urination) *BOWEL PROBLEMS (unusual diarrhea, constipation, pain near the anus) TENDERNESS IN MOUTH AND THROAT WITH OR WITHOUT PRESENCE OF ULCERS (sore throat, sores in mouth, or a toothache) UNUSUAL RASH, SWELLING OR PAIN  UNUSUAL VAGINAL DISCHARGE OR ITCHING   Items with * indicate a potential emergency and should be followed up as soon as possible or go to the Emergency Department if any problems should occur.  Please show the CHEMOTHERAPY ALERT CARD or IMMUNOTHERAPY ALERT CARD at check-in to  the Emergency Department and triage nurse.  Should you have questions after your visit or need to cancel or reschedule your appointment, please contact [facility]  Dept: [phone]  and follow the prompts.  Office hours are 8:00 a.m. to 4:30 p.m. Monday - Friday. Please note that voicemails left after 4:00 p.m. may not be returned until the following business day.  We are closed weekends and major holidays. You have access to a nurse at all times for urgent questions. Please call the main number to the clinic Dept: [phone] and follow the prompts.   For any non-urgent questions, you may also contact your provider using MyChart. We now offer e-Visits for anyone 18 and older to request care online for non-urgent symptoms. For details visit [email or web address].   Also download the MyChart app! Go to the app store, search "MyChart", open the app, select [facility], and log in with your MyChart username and password.  Due to Covid, a mask is required upon entering the hospital/clinic. If you do not have a mask, one will be given to you upon arrival. For doctor visits, patients may have 1 support person aged 18 or older with them. For treatment visits, patients cannot have anyone with them due to current Covid guidelines and our immunocompromised population.   

## 2021-03-23 ENCOUNTER — Telehealth: Payer: Self-pay | Admitting: Oncology

## 2021-03-23 NOTE — Telephone Encounter (Signed)
Scheduled appointment per 10/04 los. Patient is aware.

## 2021-03-28 ENCOUNTER — Encounter: Payer: Self-pay | Admitting: *Deleted

## 2021-03-30 ENCOUNTER — Ambulatory Visit: Payer: Medicare PPO

## 2021-04-11 ENCOUNTER — Other Ambulatory Visit: Payer: Self-pay | Admitting: *Deleted

## 2021-04-11 DIAGNOSIS — C50012 Malignant neoplasm of nipple and areola, left female breast: Secondary | ICD-10-CM

## 2021-04-12 ENCOUNTER — Inpatient Hospital Stay: Payer: Medicare PPO

## 2021-04-12 ENCOUNTER — Other Ambulatory Visit: Payer: Self-pay

## 2021-04-12 VITALS — BP 138/58 | HR 74 | Resp 17 | Wt 150.5 lb

## 2021-04-12 DIAGNOSIS — Z882 Allergy status to sulfonamides status: Secondary | ICD-10-CM | POA: Diagnosis not present

## 2021-04-12 DIAGNOSIS — I10 Essential (primary) hypertension: Secondary | ICD-10-CM | POA: Diagnosis not present

## 2021-04-12 DIAGNOSIS — Z95828 Presence of other vascular implants and grafts: Secondary | ICD-10-CM

## 2021-04-12 DIAGNOSIS — C50411 Malignant neoplasm of upper-outer quadrant of right female breast: Secondary | ICD-10-CM

## 2021-04-12 DIAGNOSIS — Z923 Personal history of irradiation: Secondary | ICD-10-CM | POA: Diagnosis not present

## 2021-04-12 DIAGNOSIS — Z79899 Other long term (current) drug therapy: Secondary | ICD-10-CM | POA: Diagnosis not present

## 2021-04-12 DIAGNOSIS — C50012 Malignant neoplasm of nipple and areola, left female breast: Secondary | ICD-10-CM

## 2021-04-12 DIAGNOSIS — Z171 Estrogen receptor negative status [ER-]: Secondary | ICD-10-CM

## 2021-04-12 DIAGNOSIS — Z79811 Long term (current) use of aromatase inhibitors: Secondary | ICD-10-CM | POA: Diagnosis not present

## 2021-04-12 DIAGNOSIS — I071 Rheumatic tricuspid insufficiency: Secondary | ICD-10-CM | POA: Diagnosis not present

## 2021-04-12 DIAGNOSIS — Z5112 Encounter for antineoplastic immunotherapy: Secondary | ICD-10-CM | POA: Diagnosis not present

## 2021-04-12 LAB — CBC WITH DIFFERENTIAL (CANCER CENTER ONLY)
Abs Immature Granulocytes: 0 10*3/uL (ref 0.00–0.07)
Basophils Absolute: 0 10*3/uL (ref 0.0–0.1)
Basophils Relative: 1 %
Eosinophils Absolute: 0.1 10*3/uL (ref 0.0–0.5)
Eosinophils Relative: 2 %
HCT: 31.2 % — ABNORMAL LOW (ref 36.0–46.0)
Hemoglobin: 10.4 g/dL — ABNORMAL LOW (ref 12.0–15.0)
Immature Granulocytes: 0 %
Lymphocytes Relative: 32 %
Lymphs Abs: 1 10*3/uL (ref 0.7–4.0)
MCH: 29.9 pg (ref 26.0–34.0)
MCHC: 33.3 g/dL (ref 30.0–36.0)
MCV: 89.7 fL (ref 80.0–100.0)
Monocytes Absolute: 0.3 10*3/uL (ref 0.1–1.0)
Monocytes Relative: 11 %
Neutro Abs: 1.7 10*3/uL (ref 1.7–7.7)
Neutrophils Relative %: 54 %
Platelet Count: 210 10*3/uL (ref 150–400)
RBC: 3.48 MIL/uL — ABNORMAL LOW (ref 3.87–5.11)
RDW: 12.7 % (ref 11.5–15.5)
WBC Count: 3.1 10*3/uL — ABNORMAL LOW (ref 4.0–10.5)
nRBC: 0 % (ref 0.0–0.2)

## 2021-04-12 LAB — CMP (CANCER CENTER ONLY)
ALT: 25 U/L (ref 0–44)
AST: 26 U/L (ref 15–41)
Albumin: 4 g/dL (ref 3.5–5.0)
Alkaline Phosphatase: 68 U/L (ref 38–126)
Anion gap: 10 (ref 5–15)
BUN: 21 mg/dL (ref 8–23)
CO2: 25 mmol/L (ref 22–32)
Calcium: 9.6 mg/dL (ref 8.9–10.3)
Chloride: 105 mmol/L (ref 98–111)
Creatinine: 0.99 mg/dL (ref 0.44–1.00)
GFR, Estimated: >60 mL/min (ref >60)
Glucose, Bld: 100 mg/dL — ABNORMAL HIGH (ref 70–99)
Potassium: 3.7 mmol/L (ref 3.5–5.1)
Sodium: 140 mmol/L (ref 135–145)
Total Bilirubin: 0.3 mg/dL (ref 0.3–1.2)
Total Protein: 7.4 g/dL (ref 6.5–8.1)

## 2021-04-12 MED ORDER — DIPHENHYDRAMINE HCL 25 MG PO CAPS
25.0000 mg | ORAL_CAPSULE | Freq: Once | ORAL | Status: AC
  Administered 2021-04-12: 25 mg via ORAL
  Filled 2021-04-12: qty 1

## 2021-04-12 MED ORDER — HEPARIN SOD (PORK) LOCK FLUSH 100 UNIT/ML IV SOLN
500.0000 [IU] | Freq: Once | INTRAVENOUS | Status: AC | PRN
  Administered 2021-04-12: 500 [IU]

## 2021-04-12 MED ORDER — SODIUM CHLORIDE 0.9% FLUSH
10.0000 mL | INTRAVENOUS | Status: DC | PRN
  Administered 2021-04-12: 10 mL

## 2021-04-12 MED ORDER — SODIUM CHLORIDE 0.9 % IV SOLN: Freq: Once | INTRAVENOUS | Status: AC

## 2021-04-12 MED ORDER — ACETAMINOPHEN 325 MG PO TABS
650.0000 mg | ORAL_TABLET | Freq: Once | ORAL | Status: AC
  Administered 2021-04-12: 650 mg via ORAL
  Filled 2021-04-12: qty 2

## 2021-04-12 MED ORDER — SODIUM CHLORIDE 0.9 % IV SOLN
6.0000 mg/kg | Freq: Once | INTRAVENOUS | Status: AC
  Administered 2021-04-12: 420 mg via INTRAVENOUS
  Filled 2021-04-12: qty 20

## 2021-04-12 MED ORDER — SODIUM CHLORIDE 0.9% FLUSH
10.0000 mL | Freq: Once | INTRAVENOUS | Status: AC
  Administered 2021-04-12: 10 mL

## 2021-04-12 NOTE — Patient Instructions (Signed)
[facility]  Discharge Instructions: °Thank you for choosing [facility] to provide your oncology and hematology care.  ° °If you have a lab appointment with the Cancer Center, please go directly to the Cancer Center and check in at the registration area. °  °Wear comfortable clothing and clothing appropriate for easy access to any Portacath or PICC line.  ° °We strive to give you quality time with your provider. You may need to reschedule your appointment if you arrive late (15 or more minutes).  Arriving late affects you and other patients whose appointments are after yours.  Also, if you miss three or more appointments without notifying the office, you may be dismissed from the clinic at the provider’s discretion.    °  °For prescription refill requests, have your pharmacy contact our office and allow 72 hours for refills to be completed.   ° °Today you received the following chemotherapy and/or immunotherapy agent: Trastuzumab (Kanjinti). °  °To help prevent nausea and vomiting after your treatment, we encourage you to take your nausea medication as directed. ° °BELOW ARE SYMPTOMS THAT SHOULD BE REPORTED IMMEDIATELY: °*FEVER GREATER THAN 100.4 F (38 °C) OR HIGHER °*CHILLS OR SWEATING °*NAUSEA AND VOMITING THAT IS NOT CONTROLLED WITH YOUR NAUSEA MEDICATION °*UNUSUAL SHORTNESS OF BREATH °*UNUSUAL BRUISING OR BLEEDING °*URINARY PROBLEMS (pain or burning when urinating, or frequent urination) °*BOWEL PROBLEMS (unusual diarrhea, constipation, pain near the anus) °TENDERNESS IN MOUTH AND THROAT WITH OR WITHOUT PRESENCE OF ULCERS (sore throat, sores in mouth, or a toothache) °UNUSUAL RASH, SWELLING OR PAIN  °UNUSUAL VAGINAL DISCHARGE OR ITCHING  ° °Items with * indicate a potential emergency and should be followed up as soon as possible or go to the Emergency Department if any problems should occur. ° °Please show the CHEMOTHERAPY ALERT CARD or IMMUNOTHERAPY ALERT CARD at  check-in to the Emergency Department and triage nurse. ° °Should you have questions after your visit or need to cancel or reschedule your appointment, please contact [facility]  Dept: [phone]  and follow the prompts.  Office hours are 8:00 a.m. to 4:30 p.m. Monday - Friday. Please note that voicemails left after 4:00 p.m. may not be returned until the following business day.  We are closed weekends and major holidays. You have access to a nurse at all times for urgent questions. Please call the main number to the clinic Dept: [phone] and follow the prompts. ° ° °For any non-urgent questions, you may also contact your provider using MyChart. We now offer e-Visits for anyone 18 and older to request care online for non-urgent symptoms. For details visit [email or web address]. °  °Also download the MyChart app! Go to the app store, search "MyChart", open the app, select , and log in with your MyChart username and password. ° °Due to Covid, a mask is required upon entering the hospital/clinic. If you do not have a mask, one will be given to you upon arrival. For doctor visits, patients may have 1 support person aged 18 or older with them. For treatment visits, patients cannot have anyone with them due to current Covid guidelines and our immunocompromised population.  ° °

## 2021-04-22 ENCOUNTER — Ambulatory Visit: Payer: Medicare PPO | Attending: Oncology

## 2021-04-22 ENCOUNTER — Other Ambulatory Visit: Payer: Self-pay

## 2021-04-22 DIAGNOSIS — Z483 Aftercare following surgery for neoplasm: Secondary | ICD-10-CM | POA: Diagnosis not present

## 2021-04-22 DIAGNOSIS — R293 Abnormal posture: Secondary | ICD-10-CM | POA: Diagnosis not present

## 2021-04-22 DIAGNOSIS — C50412 Malignant neoplasm of upper-outer quadrant of left female breast: Secondary | ICD-10-CM | POA: Diagnosis not present

## 2021-04-22 DIAGNOSIS — R609 Edema, unspecified: Secondary | ICD-10-CM | POA: Diagnosis not present

## 2021-04-22 NOTE — Therapy (Signed)
[facility] @ [address] [place], [place], [address] Phone: [phone]   Fax:  [phone]  Physical Therapy Treatment  Patient Details  Name: April [name] MRN: [id number] Date of Birth: [birth year] Referring Provider (PT): Dr. Jana Hakim   Encounter Date: 04/22/2021   PT End of Session - 04/22/21 1211     Visit Number 6    Number of Visits 14    Date for PT Re-Evaluation 05/20/21    Authorization Type [employer]    Authorization - Number of Visits 14    PT Start Time 1105    PT Stop Time 1158    PT Time Calculation (min) 53 min    Activity Tolerance Patient tolerated treatment well    Behavior During Therapy [facility] for tasks assessed/performed             Past Medical History:  Diagnosis Date   Breast cancer ([place]) 05/2017   right breast   Eczema    Family history of breast cancer    Family history of ovarian cancer    History of radiation therapy 10/17/17- 11/14/17   40.05 directed to the right breast in 15 fractions, followed by a boost of 10 gy given in 5 fractions.    Hypertension    toxemia during pregnancy, no meds now    Past Surgical History:  Procedure Laterality Date   ABDOMINAL HYSTERECTOMY     BREAST LUMPECTOMY WITH RADIOACTIVE SEED AND SENTINEL LYMPH NODE BIOPSY Right 07/17/2017   Procedure: BREAST LUMPECTOMY WITH RADIOACTIVE SEED AND SENTINEL LYMPH NODE BIOPSY;  Surgeon: Rolm Bookbinder, MD;  Location: [facility];  Service: General;  Laterality: Right;   BREAST LUMPECTOMY WITH RADIOACTIVE SEED AND SENTINEL LYMPH NODE BIOPSY Left 07/13/2020   Procedure: LEFT BREAST LUMPECTOMY WITH RADIOACTIVE SEED AND LEFT AXILLARY SENTINEL LYMPH NODE BIOPSY;  Surgeon: Rolm Bookbinder, MD;  Location: [facility];  Service: General;  Laterality: Left;   CESAREAN SECTION     x4   DILATION AND CURETTAGE OF UTERUS     KNEE SURGERY Right    PORT-A-CATH REMOVAL Right 09/04/2018    Procedure: REMOVAL PORT-A-CATH;  Surgeon: Rolm Bookbinder, MD;  Location: [facility];  Service: General;  Laterality: Right;   PORTACATH PLACEMENT N/A 07/17/2017   Procedure: INSERTION PORT-A-CATH WITH [place];  Surgeon: Rolm Bookbinder, MD;  Location: [facility];  Service: General;  Laterality: N/A;   PORTACATH PLACEMENT Right 07/13/2020   Procedure: INSERTION PORT-A-CATH WITH ULTRASOUND GUIDANCE;  Surgeon: Rolm Bookbinder, MD;  Location: [facility];  Service: General;  Laterality: Right;    There were no vitals filed for this visit.   Subjective Assessment - 04/22/21 1109     Subjective My mom passed and I haven't been doing any exercises. I havent had any problems.  I have been wearing my compression bras and that has helped.  My breast is softer but a little thicker underneath my breast and still feels swollen in the axilla and breast but overall improved. The shoulder ROM and axillary tightness seems to be fine. No real discomfort with reaching. Able to sleep on left side now.    Pertinent History Pt with prior history of right breast cancer  with  with surgery on 07/17/17 for right lumpectomy and SLNB with 5 LN removed.  Had chemo and radiation. Most recent surgery1/25/22 for left breast lumpectomy with 0/5 positive nodes. She will have chemo, radiation and infusions for  immunotherapy.    Patient Stated Goals Want to decrease left breast swelling, decrease tightness left shoulder/axilla    Currently in Pain? No/denies    Pain Score 0-No pain    Multiple Pain Sites No                [facility] PT Assessment - 04/22/21 0001       Assessment   Medical Diagnosis Left Breast Cancer    Referring Provider (PT) Dr. Jana Hakim    Onset Date/Surgical Date 07/13/20    Hand Dominance Right      Precautions   Precaution Comments lymphedema risk      Balance Screen   Has the patient fallen in the past 6 months No    Has the patient had a decrease  in activity level because of a fear of falling?  No    Is the patient reluctant to leave their home because of a fear of falling?  No      Home Ecologist residence      Prior Function   Level of Independence Independent    Leisure walking 1.5 miles, vibration plate,      Cognition   Overall Cognitive Status Within Functional Limits for tasks assessed      Observation/Other Assessments   Observations Left breast with considerable generalized swelling but without noted fibrosis, swelling also noted at inferior axillary region on left.  No visible or palpable cording noted today      AROM   Left Shoulder Extension 60 Degrees    Left Shoulder Flexion 165 Degrees    Left Shoulder ABduction 180 Degrees    Left Shoulder External Rotation 98 Degrees                           [facility] Adult PT Treatment/Exercise - 04/22/21 0001       Manual Therapy   Manual Lymphatic Drainage (MLD) Therapist performed supraclavicular, 5 resisted diaphragmatic breaths, bilateral axillary LN, left inguinal LN's, left axillo-inguinal pathway, and interaxillary pathway and left breast and axilla in supine and SL and  retracing all steps and ending with LN's                          PT Long Term Goals - 04/22/21 1126       PT LONG TERM GOAL #1   Title Patient will demonstrate she has returned to baseline since surgery related to shoulder ROM and function.    Time 6    Period Weeks    Status Achieved    Target Date 04/22/21      PT LONG TERM GOAL #2   Title Pt will be independent in left breast MLD    Time 4    Period Weeks    Status On-going    Target Date 05/19/21      PT LONG TERM GOAL #3   Title Pt will report decreased breast heaviness by 50% or greater    Baseline 40%    Time 4    Period Weeks    Status On-going    Target Date 05/20/21      PT LONG TERM GOAL #4   Title Pt will be independent with scar massage                    Plan - 04/22/21 1212     Clinical Impression Statement  Pt lost her mom shortly after her most recent visit and was also out of town for a week.  She has not tried the ABC strength exercises, and she has not performed Self breast MLD.  Shoulder ROM and axillary pain is now WNL, however her left breast still has considerable swelling with hyperpigmentation from radiation despite wearing her compression bra with very mild fibrosis/hyperplasia under the breast.  Therapist performed left breast MLD today and pt will start back to her MLD at home.  I will also send her demographics to see if Flexi touch will be covered by insurance    Personal Factors and Comorbidities Comorbidity 2    Comorbidities Hx of bilateral breast CA with new onset of left Breast CA. ( right in 2019), lymphedema risk    Stability/Clinical Decision Making Stable/Uncomplicated    Clinical Decision Making Low    Rehab Potential Excellent    PT Frequency 2x / week    PT Duration 6 weeks    PT Treatment/Interventions ADLs/Self Care Home Management;Therapeutic exercise;Patient/family education;Manual techniques;Manual lymph drainage;Passive range of motion    PT Next Visit Plan Breast MLD and review Self MLD, ABC strength    PT Home Exercise Plan Supine wand, star gazer, abduction wall slides, wear compression bra    Recommended Other Services sent demographics to [facility]    Consulted and Agree with Plan of Care Patient             Patient will benefit from skilled therapeutic intervention in order to improve the following deficits and impairments:  Postural dysfunction, Decreased knowledge of precautions, Increased edema, Decreased range of motion, Decreased strength, Pain  Visit Diagnosis: Malignant neoplasm of upper-outer quadrant of left female breast, unspecified estrogen receptor status ([place])  Aftercare following surgery for neoplasm  Edema, unspecified type  Abnormal posture     Problem  List Patient Active Problem List   Diagnosis Date Noted   Age-related osteoporosis without current pathological fracture 09/08/2020   Allergic rhinitis 09/08/2020   Palpitations 09/08/2020   Personal history of malignant neoplasm of breast 09/08/2020   Personal history of transient ischemic attack (TIA), and cerebral infarction without residual deficits 09/08/2020   Pure hypercholesterolemia 09/08/2020   Vitamin D deficiency 09/08/2020   Neuropathy due to chemotherapeutic drug ([place]) 07/23/2018   Port-A-Cath in place 08/28/2017   TIA (transient ischemic attack) 08/09/2017   Hypertension    Genetic testing 07/06/2017   Family history of ovarian cancer    Family history of breast cancer    Malignant neoplasm of upper-outer quadrant of right breast in female, estrogen receptor negative ([place]) 06/26/2017   Malignant neoplasm of left breast, estrogen receptor positive ([place]) 05/19/2017    Claris Pong, PT 04/22/2021, 12:24 PM  [facility] @ [place], [place], [address] Phone: [phone]   Fax:  [phone]  Name: KATHELINE BRENDLINGER MRN: [id number] Date of Birth: 30-Sep-1950

## 2021-04-25 DIAGNOSIS — C50919 Malignant neoplasm of unspecified site of unspecified female breast: Secondary | ICD-10-CM | POA: Diagnosis not present

## 2021-04-25 DIAGNOSIS — I10 Essential (primary) hypertension: Secondary | ICD-10-CM | POA: Diagnosis not present

## 2021-04-25 DIAGNOSIS — E7800 Pure hypercholesterolemia, unspecified: Secondary | ICD-10-CM | POA: Diagnosis not present

## 2021-04-26 ENCOUNTER — Ambulatory Visit: Payer: Medicare PPO

## 2021-04-26 ENCOUNTER — Other Ambulatory Visit: Payer: Self-pay

## 2021-04-26 DIAGNOSIS — Z483 Aftercare following surgery for neoplasm: Secondary | ICD-10-CM | POA: Diagnosis not present

## 2021-04-26 DIAGNOSIS — R609 Edema, unspecified: Secondary | ICD-10-CM | POA: Diagnosis not present

## 2021-04-26 DIAGNOSIS — C50412 Malignant neoplasm of upper-outer quadrant of left female breast: Secondary | ICD-10-CM | POA: Diagnosis not present

## 2021-04-26 DIAGNOSIS — R293 Abnormal posture: Secondary | ICD-10-CM | POA: Diagnosis not present

## 2021-04-26 NOTE — Therapy (Signed)
[facility] @ [address] [place], [place], [address] Phone: [phone]   Fax:  [phone]  Physical Therapy Treatment  Patient Details  Name: April [name] MRN: [id number] Date of Birth: [birth year] Referring Provider (PT): Dr. Jana Hakim   Encounter Date: 04/26/2021   PT End of Session - 04/26/21 1009     Visit Number 7    Number of Visits 14    Date for PT Re-Evaluation 05/20/21    Authorization Type [employer]    Authorization - Number of Visits 14    PT Start Time 1005    PT Stop Time 1052    PT Time Calculation (min) 47 min    Activity Tolerance Patient tolerated treatment well    Behavior During Therapy [facility] for tasks assessed/performed             Past Medical History:  Diagnosis Date   Breast cancer ([place]) 05/2017   right breast   Eczema    Family history of breast cancer    Family history of ovarian cancer    History of radiation therapy 10/17/17- 11/14/17   40.05 directed to the right breast in 15 fractions, followed by a boost of 10 gy given in 5 fractions.    Hypertension    toxemia during pregnancy, no meds now    Past Surgical History:  Procedure Laterality Date   ABDOMINAL HYSTERECTOMY     BREAST LUMPECTOMY WITH RADIOACTIVE SEED AND SENTINEL LYMPH NODE BIOPSY Right 07/17/2017   Procedure: BREAST LUMPECTOMY WITH RADIOACTIVE SEED AND SENTINEL LYMPH NODE BIOPSY;  Surgeon: Rolm Bookbinder, MD;  Location: [facility];  Service: General;  Laterality: Right;   BREAST LUMPECTOMY WITH RADIOACTIVE SEED AND SENTINEL LYMPH NODE BIOPSY Left 07/13/2020   Procedure: LEFT BREAST LUMPECTOMY WITH RADIOACTIVE SEED AND LEFT AXILLARY SENTINEL LYMPH NODE BIOPSY;  Surgeon: Rolm Bookbinder, MD;  Location: [facility];  Service: General;  Laterality: Left;   CESAREAN SECTION     x4   DILATION AND CURETTAGE OF UTERUS     KNEE SURGERY Right    PORT-A-CATH REMOVAL Right 09/04/2018    Procedure: REMOVAL PORT-A-CATH;  Surgeon: Rolm Bookbinder, MD;  Location: [facility];  Service: General;  Laterality: Right;   PORTACATH PLACEMENT N/A 07/17/2017   Procedure: INSERTION PORT-A-CATH WITH [place];  Surgeon: Rolm Bookbinder, MD;  Location: [facility];  Service: General;  Laterality: N/A;   PORTACATH PLACEMENT Right 07/13/2020   Procedure: INSERTION PORT-A-CATH WITH ULTRASOUND GUIDANCE;  Surgeon: Rolm Bookbinder, MD;  Location: [facility];  Service: General;  Laterality: Right;    There were no vitals filed for this visit.   Subjective Assessment - 04/26/21 1004     Subjective Axillary region still feels pretty good.  I feel like I may be swollen in my left back. I tried the MLD 1 time. I think I have the sequence pretty well    Pertinent History Pt with prior history of right breast cancer  with  with surgery on 07/17/17 for right lumpectomy and SLNB with 5 LN removed.  Had chemo and radiation. Most recent surgery1/25/22 for left breast lumpectomy with 0/5 positive nodes. She will have chemo, radiation and infusions for immunotherapy.    Patient Stated Goals Want to decrease left breast swelling, decrease tightness left shoulder/axilla    Currently in Pain? No/denies    Pain Score 0-No pain  [facility] Adult PT Treatment/Exercise - 04/26/21 0001       Manual Therapy   Manual Lymphatic Drainage (MLD) Therapist performed supraclavicular, 5 resisted diaphragmatic breaths, Left axillary LN, left inguinal LN's, left axillo-inguinal pathway,  and left breast and axilla in supine  and  retracing all steps and ending with LN's                          PT Long Term Goals - 04/22/21 1126       PT LONG TERM GOAL #1   Title Patient will demonstrate she has returned to baseline since surgery related to shoulder ROM and function.    Time 6    Period Weeks    Status Achieved     Target Date 04/22/21      PT LONG TERM GOAL #2   Title Pt will be independent in left breast MLD    Time 4    Period Weeks    Status On-going    Target Date 05/19/21      PT LONG TERM GOAL #3   Title Pt will report decreased breast heaviness by 50% or greater    Baseline 40%    Time 4    Period Weeks    Status On-going    Target Date 05/20/21      PT LONG TERM GOAL #4   Title Pt will be independent with scar massage                   Plan - 04/26/21 1053     Clinical Impression Statement Performed MLD to pts left breast and axillary region.  Axillary region visibly reduced afterwards.    Personal Factors and Comorbidities Comorbidity 2    Comorbidities Hx of bilateral breast CA with new onset of left Breast CA. ( right in 2019), lymphedema risk    Stability/Clinical Decision Making Stable/Uncomplicated    Rehab Potential Excellent    PT Frequency 2x / week    PT Duration 6 weeks    PT Treatment/Interventions ADLs/Self Care Home Management;Therapeutic exercise;Patient/family education;Manual techniques;Manual lymph drainage;Passive range of motion    PT Next Visit Plan Breast MLD and review Self MLD, ABC strength    PT Home Exercise Plan Supine wand, star gazer, abduction wall slides, wear compression bra    Consulted and Agree with Plan of Care Patient             Patient will benefit from skilled therapeutic intervention in order to improve the following deficits and impairments:  Postural dysfunction, Decreased knowledge of precautions, Increased edema, Decreased range of motion, Decreased strength, Pain  Visit Diagnosis: Malignant neoplasm of upper-outer quadrant of left female breast, unspecified estrogen receptor status ([facility])  Aftercare following surgery for neoplasm  Edema, unspecified type  Abnormal posture     Problem List Patient Active Problem List   Diagnosis Date Noted   Age-related osteoporosis without current pathological fracture  09/08/2020   Allergic rhinitis 09/08/2020   Palpitations 09/08/2020   Personal history of malignant neoplasm of breast 09/08/2020   Personal history of transient ischemic attack (TIA), and cerebral infarction without residual deficits 09/08/2020   Pure hypercholesterolemia 09/08/2020   Vitamin D deficiency 09/08/2020   Neuropathy due to chemotherapeutic drug ([facility]) 07/23/2018   Port-A-Cath in place 08/28/2017   TIA (transient ischemic attack) 08/09/2017   Hypertension    Genetic testing 07/06/2017   Family history of ovarian cancer    Family  history of breast cancer    Malignant neoplasm of upper-outer quadrant of right breast in female, estrogen receptor negative ([place]) 06/26/2017   Malignant neoplasm of left breast, estrogen receptor positive ([place]) 05/19/2017    Claris Pong, PT 04/26/2021, 11:06 AM  [facility] @ [address], [place], [address] Phone: [phone]   Fax:  [phone]  Name: ALNISA HASLEY MRN: [id number] Date of Birth: April 24, 1951

## 2021-04-28 ENCOUNTER — Ambulatory Visit: Payer: Medicare PPO

## 2021-04-28 ENCOUNTER — Other Ambulatory Visit: Payer: Self-pay

## 2021-04-28 DIAGNOSIS — C50412 Malignant neoplasm of upper-outer quadrant of left female breast: Secondary | ICD-10-CM | POA: Diagnosis not present

## 2021-04-28 DIAGNOSIS — R609 Edema, unspecified: Secondary | ICD-10-CM

## 2021-04-28 DIAGNOSIS — R293 Abnormal posture: Secondary | ICD-10-CM | POA: Diagnosis not present

## 2021-04-28 DIAGNOSIS — Z483 Aftercare following surgery for neoplasm: Secondary | ICD-10-CM

## 2021-04-28 NOTE — Therapy (Signed)
[facility] @ [facility] [place], [place], [address] Phone: [phone]   Fax:  [phone]  Physical Therapy Treatment  Patient Details  Name: April [name] MRN: [id number] Date of Birth: [birth year] Referring Provider (PT): Dr. Jana Hakim   Encounter Date: 04/28/2021   PT End of Session - 04/28/21 1009     Visit Number 8    Number of Visits 14    Date for PT Re-Evaluation 05/20/21    Authorization Type [employer]    PT Start Time 1005    PT Stop Time 1056    PT Time Calculation (min) 51 min    Activity Tolerance Patient tolerated treatment well    Behavior During Therapy [facility] for tasks assessed/performed             Past Medical History:  Diagnosis Date   Breast cancer ([place]) 05/2017   right breast   Eczema    Family history of breast cancer    Family history of ovarian cancer    History of radiation therapy 10/17/17- 11/14/17   40.05 directed to the right breast in 15 fractions, followed by a boost of 10 gy given in 5 fractions.    Hypertension    toxemia during pregnancy, no meds now    Past Surgical History:  Procedure Laterality Date   ABDOMINAL HYSTERECTOMY     BREAST LUMPECTOMY WITH RADIOACTIVE SEED AND SENTINEL LYMPH NODE BIOPSY Right 07/17/2017   Procedure: BREAST LUMPECTOMY WITH RADIOACTIVE SEED AND SENTINEL LYMPH NODE BIOPSY;  Surgeon: Rolm Bookbinder, MD;  Location: [facility];  Service: General;  Laterality: Right;   BREAST LUMPECTOMY WITH RADIOACTIVE SEED AND SENTINEL LYMPH NODE BIOPSY Left 07/13/2020   Procedure: LEFT BREAST LUMPECTOMY WITH RADIOACTIVE SEED AND LEFT AXILLARY SENTINEL LYMPH NODE BIOPSY;  Surgeon: Rolm Bookbinder, MD;  Location: [facility];  Service: General;  Laterality: Left;   CESAREAN SECTION     x4   DILATION AND CURETTAGE OF UTERUS     KNEE SURGERY Right    PORT-A-CATH REMOVAL Right 09/04/2018   Procedure: REMOVAL PORT-A-CATH;  Surgeon:  Rolm Bookbinder, MD;  Location: [facility];  Service: General;  Laterality: Right;   PORTACATH PLACEMENT N/A 07/17/2017   Procedure: INSERTION PORT-A-CATH WITH [place];  Surgeon: Rolm Bookbinder, MD;  Location: [facility];  Service: General;  Laterality: N/A;   PORTACATH PLACEMENT Right 07/13/2020   Procedure: INSERTION PORT-A-CATH WITH ULTRASOUND GUIDANCE;  Surgeon: Rolm Bookbinder, MD;  Location: [facility];  Service: General;  Laterality: Right;    There were no vitals filed for this visit.   Subjective Assessment - 04/28/21 1006     Subjective Under the breast is still a little swollen, but it feels better.  I did the lymphatic drainage last night and the breast does feel softer afterwards. I don't  feel heavy this am in my breast.    Pertinent History Pt with prior history of right breast cancer  with  with surgery on 07/17/17 for right lumpectomy and SLNB with 5 LN removed.  Had chemo and radiation. Most recent surgery1/25/22 for left breast lumpectomy with 0/5 positive nodes. She will have chemo, radiation and infusions for immunotherapy.    Patient Stated Goals Want to decrease left breast swelling, decrease tightness left shoulder/axilla    Currently in Pain? No/denies    Pain Score 0-No pain  [place] Adult PT Treatment/Exercise - 04/28/21 0001       Exercises   Other Exercises  took pt through [facility] strength handout 1-15. occasional VC's rquired but did well overall      Manual Therapy   Manual Lymphatic Drainage (MLD) Pt. performed supraclavicular, 5 resisted diaphragmatic breaths, Left axillary LN, left inguinal LN's, left axillo-inguinal pathway,  and left breast and axilla in supine  and  retracing all steps and ending with LN's. pt required occasional VC's and TC's but did well overall                          PT Long Term Goals - 04/22/21 1126       PT LONG TERM  GOAL #1   Title Patient will demonstrate she has returned to baseline since surgery related to shoulder ROM and function.    Time 6    Period Weeks    Status Achieved    Target Date 04/22/21      PT LONG TERM GOAL #2   Title Pt will be independent in left breast MLD    Time 4    Period Weeks    Status On-going    Target Date 05/19/21      PT LONG TERM GOAL #3   Title Pt will report decreased breast heaviness by 50% or greater    Baseline 40%    Time 4    Period Weeks    Status On-going    Target Date 05/20/21      PT LONG TERM GOAL #4   Title Pt will be independent with scar massage                   Plan - 04/28/21 1058     Clinical Impression Statement Pts left breast continues with visible swelling.  Axillary region swelling is improved today.Reviewed pts self Left breast MLD and pt did very well but required occasional VC's and tactile cues.  She has a good understanding of sequence but requires occasional tweaking of technique.  We reviewed the ABC strength handout and pt did very well.  Used crossed arms for crunches instead of behind head. Pt had some discomfort in arm with wall chest stretch so will try a different stretch next visit    Personal Factors and Comorbidities Comorbidity 2    Comorbidities Hx of bilateral breast CA with new onset of left Breast CA. ( right in 2019), lymphedema risk    Stability/Clinical Decision Making Stable/Uncomplicated    Rehab Potential Excellent    PT Frequency 2x / week    PT Duration 6 weeks    PT Treatment/Interventions ADLs/Self Care Home Management;Therapeutic exercise;Patient/family education;Manual techniques;Manual lymph drainage;Passive range of motion    PT Next Visit Plan Breast MLD and review Self MLD, ABC strength; next 17-end, peach foam in bra for lower breast?    PT Home Exercise Plan Supine wand, star gazer, abduction wall slides, wear compression bra    Consulted and Agree with Plan of Care Patient              Patient will benefit from skilled therapeutic intervention in order to improve the following deficits and impairments:  Postural dysfunction, Decreased knowledge of precautions, Increased edema, Decreased range of motion, Decreased strength, Pain  Visit Diagnosis: Malignant neoplasm of upper-outer quadrant of left female breast, unspecified estrogen receptor status ([place])  Aftercare following surgery for neoplasm  Edema, unspecified type  Abnormal posture     Problem List Patient Active Problem List   Diagnosis Date Noted   Age-related osteoporosis without current pathological fracture 09/08/2020   Allergic rhinitis 09/08/2020   Palpitations 09/08/2020   Personal history of malignant neoplasm of breast 09/08/2020   Personal history of transient ischemic attack (TIA), and cerebral infarction without residual deficits 09/08/2020   Pure hypercholesterolemia 09/08/2020   Vitamin D deficiency 09/08/2020   Neuropathy due to chemotherapeutic drug ([place]) 07/23/2018   Port-A-Cath in place 08/28/2017   TIA (transient ischemic attack) 08/09/2017   Hypertension    Genetic testing 07/06/2017   Family history of ovarian cancer    Family history of breast cancer    Malignant neoplasm of upper-outer quadrant of right breast in female, estrogen receptor negative ([place]) 06/26/2017   Malignant neoplasm of left breast, estrogen receptor positive ([place]) 05/19/2017    Claris Pong, PT 04/28/2021, 11:02 AM  [facility] @ [facility] [place], [place], [address] Phone: [phone]   Fax:  [phone]  Name: LILIANE MALLIS MRN: [id number] Date of Birth: [birth year]

## 2021-05-02 ENCOUNTER — Other Ambulatory Visit: Payer: Self-pay

## 2021-05-02 ENCOUNTER — Ambulatory Visit: Payer: Medicare PPO

## 2021-05-02 DIAGNOSIS — R293 Abnormal posture: Secondary | ICD-10-CM

## 2021-05-02 DIAGNOSIS — Z483 Aftercare following surgery for neoplasm: Secondary | ICD-10-CM | POA: Diagnosis not present

## 2021-05-02 DIAGNOSIS — C50412 Malignant neoplasm of upper-outer quadrant of left female breast: Secondary | ICD-10-CM

## 2021-05-02 DIAGNOSIS — R609 Edema, unspecified: Secondary | ICD-10-CM | POA: Diagnosis not present

## 2021-05-02 NOTE — Progress Notes (Signed)
[place]  [phone] [phone]     ID: April [name] DOB: 1950-10-12  MR#: [id number]  UXN#:[id number]  Patient Care Team: Kelton Pillar, MD as PCP - General (Family Medicine) Mera Gunkel, Virgie Dad, MD as Consulting Physician (Oncology) Rolm Bookbinder, MD as Consulting Physician (General Surgery) Eppie Gibson, MD as Attending Physician (Radiation Oncology) Newt Minion, MD as Consulting Physician (Orthopedic Surgery) Regal, Tamala Fothergill, DPM as Consulting Physician (Podiatry) Neldon Mc, Donnamarie Poag, MD as Consulting Physician (Allergy and Immunology) Mauro Kaufmann, RN as Oncology Nurse Navigator Rockwell Germany, RN as Oncology Nurse Navigator Bensimhon, Shaune Pascal, MD as Consulting Physician (Cardiology) OTHER MD:  CHIEF COMPLAINT: Estrogen receptor negative breast cancer  CURRENT TREATMENT: Herceptin, anastrozole   INTERVAL HISTORY: April [name] returns today for follow up and treatment of her new left breast cancer.   She continues on trastuzumab every 3 weeks.  She has had no side effects from this that she is aware of  Her most recent echocardiogram from 03/08/2021 showed an ejection fraction of 65%.  She was started on antiestrogens October 2022.  She has a stiff feeling in the morning when waking up but this lasts less than 20 minutes.  Her feet feel cold sometimes particularly the front of the feet but they are not actually not.  She is having some hot flashes.  Vaginal dryness is not an issue.  She does have some hair thinning which is not due to the medication since it was only started 1 month ago.  The patient's mother died at the age of 41 with severe Alzheimer's October 2022.   REVIEW OF SYSTEMS: A detailed review of systems today was otherwise stable   COVID 19 VACCINATION STATUS: Pfizer x5 as of November 2022.  LEFT BREAST CANCER HISTORY: From the original intake note:  She underwent bilateral diagnostic mammography with  tomography at [facility] on 06/15/2020 showing: breast density category C; this showed in the right breast evidence of postsurgical and radiation changes but this was all stable.  On the left however there was a new group of pleomorphic calcifications in the upper inner quadrant..  She proceeded to biopsy of the left breast area in question on 06/15/2020. Pathology from the procedure ([id number]) showed: invasive ductal carcinoma, grade 3; high-grade ductal carcinoma in situ with necrosis; lymphovascular invasion identified. Prognostic indicators significant for: estrogen receptor 40% positive with weak staining intensity; progesterone receptor 0% negative. Proliferation marker Ki67 of 45%. Her2 positive by immunohistochemistry (3+).   RIGHT BREAST CANCER HISTORY: From the original intake note:  "April [name]" had bilateral screening mammography at [facility] 06/06/2017.  This showed a possible mass in the right breast at the 11 o'clock position.  On 06/13/2017 she underwent right diagnostic mammography and ultrasonography.  Breast density was category C.  In the right breast at the 11 o'clock position there was a 1 cm area by mammography.  By ultrasound this confirmed a 1.0 cm irregular mass with lobulated margins in the upper outer quadrant of the right breast.  There was a second, 0.4 cm lobulated mass in the same quadrant.  The right axilla was sonographically benign.  On 06/20/2017 biopsy of the 2 right breast masses in question was performed.  The final pathology ([id number]) found the smaller mass to be only fibrocystic change.  This is felt to be concordant.  The larger mass however was an invasive ductal carcinoma, grade 3, estrogen and progesterone receptor negative, with an MIB-1 of 30%, and HER-2 amplified, with  a signals ratio of 2.24.  The number per cell was 4.60.  The patient's subsequent history is as detailed below.   PAST MEDICAL HISTORY: Past Medical History:  Diagnosis Date   Breast cancer  ([place]) 05/2017   right breast   Eczema    Family history of breast cancer    Family history of ovarian cancer    History of radiation therapy 10/17/17- 11/14/17   40.05 directed to the right breast in 15 fractions, followed by a boost of 10 gy given in 5 fractions.    Hypertension    toxemia during pregnancy, no meds now    PAST SURGICAL HISTORY: Past Surgical History:  Procedure Laterality Date   ABDOMINAL HYSTERECTOMY     BREAST LUMPECTOMY WITH RADIOACTIVE SEED AND SENTINEL LYMPH NODE BIOPSY Right 07/17/2017   Procedure: BREAST LUMPECTOMY WITH RADIOACTIVE SEED AND SENTINEL LYMPH NODE BIOPSY;  Surgeon: Rolm Bookbinder, MD;  Location: [place];  Service: General;  Laterality: Right;   BREAST LUMPECTOMY WITH RADIOACTIVE SEED AND SENTINEL LYMPH NODE BIOPSY Left 07/13/2020   Procedure: LEFT BREAST LUMPECTOMY WITH RADIOACTIVE SEED AND LEFT AXILLARY SENTINEL LYMPH NODE BIOPSY;  Surgeon: Rolm Bookbinder, MD;  Location: [place];  Service: General;  Laterality: Left;   CESAREAN SECTION     x4   DILATION AND CURETTAGE OF UTERUS     KNEE SURGERY Right    PORT-A-CATH REMOVAL Right 09/04/2018   Procedure: REMOVAL PORT-A-CATH;  Surgeon: Rolm Bookbinder, MD;  Location: [place];  Service: General;  Laterality: Right;   PORTACATH PLACEMENT N/A 07/17/2017   Procedure: INSERTION PORT-A-CATH WITH [place];  Surgeon: Rolm Bookbinder, MD;  Location: [place];  Service: General;  Laterality: N/A;   PORTACATH PLACEMENT Right 07/13/2020   Procedure: INSERTION PORT-A-CATH WITH ULTRASOUND GUIDANCE;  Surgeon: Rolm Bookbinder, MD;  Location: [place];  Service: General;  Laterality: Right;    FAMILY HISTORY Family History  Problem Relation Age of Onset   Breast cancer Mother 52       again at 21 in other breast    Heart attack Father 17   Breast cancer Sister 17   Breast cancer Maternal Grandmother 72       spread to  lungs, died at 42   Ovarian cancer Cousin 56   Prostate cancer Cousin   The patient's father died at age 59 from a heart attack.  The patient's mother is currently living at age 5 (as of January 2019).  The patient had 1 sister who was diagnosed with breast cancer at age 48 and died from metastatic disease at age 11.  The patient has 1 brother.  In addition the patient's mother was diagnosed with breast cancer at age 93, on the left side, and now has a right-sided breast cancer diagnosed in January 2019.  There is in addition a cousin with ovarian cancer diagnosed when she was 71 years old   GYNECOLOGIC HISTORY:  No LMP recorded. Patient has had a hysterectomy. Menarche age 52, first live birth age 2, the patient had 3 live births, 1 of whom survived only 2 days.  She underwent hysterectomy without salpingo-oophorectomy September 05, 1978.  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  Her husband [name] his Theme park manager at [employer].  The patient's  daughter [name] lives in [place] where she is an Tourist information centre manager.  The patient's daughter [name] lives in [place] working for the department of defense.  The patient has 3 grandchildren, 1 of whom is in the seventh grade but is actually the captain of the eighth grade basketball team and is playing in a championship February 2020.   ADVANCED DIRECTIVES: Not in place   HEALTH MAINTENANCE: Social History   Tobacco Use   Smoking status: Never   Smokeless tobacco: Never  Vaping Use   Vaping Use: Never used  Substance Use Topics   Alcohol use: Yes    Comment: social   Drug use: No     Colonoscopy: April 2018/[facility]  PAP: Status post hysterectomy  Bone density:   Allergies  Allergen Reactions   Tape Itching and Rash   Betamethasone Dipropionate Other (See  Comments)   Cetirizine Hcl Other (See Comments)   Lisinopril Other (See Comments)   Loratadine Other (See Comments)   Nsaids     Stomach issue   Other Other (See Comments)   Statins Other (See Comments)   Sulfa Antibiotics Rash    Current Outpatient Medications  Medication Sig Dispense Refill   anastrozole (ARIMIDEX) 1 MG tablet Take 1 tablet (1 mg total) by mouth daily. 90 tablet 4   aspirin 81 MG chewable tablet Chew 81 mg by mouth daily.     Calcium-Vitamin D-Vitamin K (CALCIUM + D) [phone] MG-UNT-MCG CHEW      co-enzyme Q-10 30 MG capsule Take 30 mg by mouth daily.     hydrochlorothiazide (MICROZIDE) 12.5 MG capsule TAKE 1 CAPSULE BY MOUTH EVERY DAY 90 capsule 3   magnesium citrate SOLN Take 1/2 bottle now; if no results after 2 hours take rest of the bottle 296 mL 0   Omega-3 Fatty Acids (FISH OIL) 1000 MG CAPS Take 1 capsule by mouth daily.     polyethylene glycol powder (MIRALAX) 17 GM/SCOOP powder Take 17 g by mouth daily. 255 g 0   Red Yeast Rice Extract (RED YEAST RICE PO) Take by mouth.     senna (SENOKOT) 8.6 MG TABS tablet Take 2 tablets (17.2 mg total) by mouth in the morning and at bedtime. 120 tablet 0   vitamin B-12 (CYANOCOBALAMIN) 1000 MCG tablet Take 1,000 mcg by mouth daily.     vitamin C (ASCORBIC ACID) 250 MG tablet Take 250 mg by mouth daily.     No current facility-administered medications for this visit.   Facility-Administered Medications Ordered in Other Visits  Medication Dose Route Frequency Provider Last Rate Last Admin   sodium chloride flush (NS) 0.9 % injection 10 mL  10 mL Intracatheter PRN Corion Sherrod, Virgie Dad, MD   10 mL at 05/03/21 1531    OBJECTIVE: African-American woman who appears younger than stated age 39:   05/03/21 1259  BP: (!) 146/58  Pulse: 73  Resp: 17  Temp: 98.1 F (36.7 C)  SpO2: 100%      Body mass index is 26.26 kg/m.    Wt Readings from Last 3 Encounters:  05/03/21 150 lb 9.6 oz (68.3 kg)  04/12/21 150 lb 8  oz (68.3 kg)  03/22/21 155 lb 9.6 oz (70.6 kg)  ECOG FS:1 - Symptomatic but completely ambulatory   Sclerae unicteric, EOMs intact Wearing a mask No cervical or supraclavicular adenopathy Lungs no rales or rhonchi Heart regular rate and rhythm Abd soft, nontender, positive bowel sounds MSK no focal spinal tenderness, no upper extremity lymphedema Neuro: nonfocal,  well oriented, appropriate affect Breasts: Status post bilateral lumpectomies and bilateral radiation.  There is no evidence of local recurrence.  Both axillae are benign   LAB RESULTS:  CMP     Component Value Date/Time   NA 141 05/03/2021 1243   K 3.4 (L) 05/03/2021 1243   CL 106 05/03/2021 1243   CO2 25 05/03/2021 1243   GLUCOSE 127 (H) 05/03/2021 1243   BUN 15 05/03/2021 1243   CREATININE 1.02 (H) 05/03/2021 1243   CALCIUM 9.4 05/03/2021 1243   PROT 7.3 05/03/2021 1243   ALBUMIN 3.9 05/03/2021 1243   AST 20 05/03/2021 1243   ALT 21 05/03/2021 1243   ALKPHOS 75 05/03/2021 1243   BILITOT 0.2 (L) 05/03/2021 1243   GFRNONAA 59 (L) 05/03/2021 1243   GFRAA >60 07/10/2019 1008   GFRAA >60 06/27/2017 0843    No results found for: TOTALPROTELP, ALBUMINELP, A1GS, A2GS, BETS, BETA2SER, GAMS, MSPIKE, SPEI  No results found for: KPAFRELGTCHN, LAMBDASER, KAPLAMBRATIO  Lab Results  Component Value Date   WBC 3.3 (L) 05/03/2021   NEUTROABS 1.8 05/03/2021   HGB 10.7 (L) 05/03/2021   HCT 32.3 (L) 05/03/2021   MCV 89.2 05/03/2021   PLT 227 05/03/2021   No results found for: LABCA2  No components found for: KZLDJT701  No results for input(s): INR in the last 168 hours.  No results found for: LABCA2  No results found for: XBL390  No results found for: ZES923  No results found for: RAQ762  No results found for: CA2729  No components found for: HGQUANT  No results found for: CEA1 / No results found for: CEA1   No results found for: AFPTUMOR  No results found for: CHROMOGRNA  No results found for:  HGBA, HGBA2QUANT, HGBFQUANT, HGBSQUAN (Hemoglobinopathy evaluation)   No results found for: LDH  Lab Results  Component Value Date   IRON 77 05/21/2018   TIBC 404 05/21/2018   IRONPCTSAT 19 (L) 05/21/2018   (Iron and TIBC)  Lab Results  Component Value Date   FERRITIN 24 05/21/2018    Urinalysis    Component Value Date/Time   COLORURINE COLORLESS (A) 08/09/2017 2224   APPEARANCEUR CLEAR 08/09/2017 2224   LABSPEC 1.004 (L) 08/09/2017 2224   PHURINE 6.0 08/09/2017 [place] 08/09/2017 2224   HGBUR NEGATIVE 08/09/2017 2224   BILIRUBINUR NEGATIVE 08/09/2017 2224   KETONESUR NEGATIVE 08/09/2017 2224   PROTEINUR NEGATIVE 08/09/2017 2224   NITRITE NEGATIVE 08/09/2017 2224   LEUKOCYTESUR NEGATIVE 08/09/2017 2224    STUDIES: No results found.   ELIGIBLE FOR AVAILABLE RESEARCH PROTOCOL: no   ASSESSMENT: 69 y.o. [place] woman   RIGHT BREAST CANCER (0) status post right breast upper outer quadrant biopsy 06/20/2017 for a clinical T1b N0, stage IA invasive ductal carcinoma, grade 3, estrogen and progesterone receptor negative, but HER-2 amplified, with an MIB-1 of 30%  (1) genetics testing 07/05/2017 through the Common Hereditary Cancer Panel offered by [facility] found no deleterious mutations in APC, ATM, AXIN2, BARD1, BMPR1A, BRCA1, BRCA2, BRIP1, CDH1, CDKN2A (p14ARF), CDKN2A (p16INK4a), CKD4, CHEK2, CTNNA1, DICER1, EPCAM (Deletion/duplication testing only), GREM1 (promoter region deletion/duplication testing only), KIT, MEN1, MLH1, MSH2, MSH3, MSH6, MUTYH, NBN, NF1, NHTL1, PALB2, PDGFRA, PMS2, POLD1, POLE, PTEN, RAD50, RAD51C, RAD51D, SDHB, SDHC, SDHD, SMAD4, SMARCA4. STK11, TP53, TSC1, TSC2, and VHL.  The following genes were evaluated for sequence changes only: SDHA and HOXB13 c.251G>A variant only.  (a) a Variant of uncertain significance in MSH2 was identified c.1331G>T (p.Arg444Leu).   (  2) status post right lumpectomy and sentinel lymph node  sampling 07/17/2017 for a pT1c pN0, stage IA invasive ductal carcinoma, grade 2, with negative margins.  A total of 5 lymph nodes were removed  (3) adjuvant chemotherapy consisting of paclitaxel weekly x12 together with trastuzumab every 21 days starting 08/07/2017  (a) paclitaxel stopped after 8 doses because of neuropathy (last dose 09/25/2017  (4) trastuzumab continued to total 1 year (last dose 07/23/2018)  (a) echo 08/10/2017 showed an ejection fraction in the 65-70% range  (b) echo on 11/01/2017 shows EF of 60-65%  (c) cardiogram 02/05/2018 shows an ejection fraction in the 60-65% range.  (d) echocardiogram 05/28/2018 shows an ejection fraction in the 65-70%.  (5) adjuvant radiation 10/17/2017-11/14/2016: 40.05 Gy directed to the Right Breast in 15 fractions, followed by a boost of 10 Gy given in 5 fractions   (6) anemia, with normal MCV, ferritin, B12, folate, and inappropriately normal reticulocyte count, consistent with anemia of chronic illness.  LEFT BREAST CANCER: (7) status post left breast upper inner quadrant biopsy 06/15/2020 for a clinical T1 N0 invasive ductal carcinoma, grade 3, weakly estrogen receptor positive, progesterone receptor negative, HER2 amplified, with an MIB-1 of 45%.  (8) status post left lumpectomy and sentinel lymph node sampling 07/13/2020 for a pT1c pN0, stage IB invasive ductal carcinoma, grade 3, with negative margins  (a) a total of 5 axilla lymph nodes were removed, all benign  (b) repeat prognostic panel again weakly estrogen receptor positive, progesterone receptor negative, now HER-2 not amplified  (9) adjuvant chemotherapy and anti-HER2 immunotherapy started 07/27/2020, consisting of CMF chemotherapy to be repeated every 21 days x 8, completed 12/28/2020, with trastuzumab to be continued for 1 year.  (a) echocardiogram 05/28/2020 shows an ejection fraction in the 65-70% range  (b) echo 08/13/2020 shows an ejection fraction in the 65 to 70% range  (c)  cycle 4 of CMF delayed because of neutropenia; full [place] added with subsequent cycles  (d) methotrexate omitted during radiation over 1 overlap  (10) trastuzumab to be continued to February 2023  (a) most recent echo 12/10/2020 shows an ejection fraction in the 60-65% range  (b) echo 03/08/2021 shows an ejection fraction in the 65% range  (11) adjuvant radiation3/24/2022 through 10/20/2020 Site Technique Total Dose (Gy) Dose per Fx (Gy) Completed Fx Beam Energies  Breast, Left: Breast_Lt 3D 50/50 2 25/25 6X, 10X  Breast, Left: Breast_Lt_Bst 3D 10/10 2 5/5 6X, 10X   (12) started anastrozole October 2022   PLAN: April [name] is close to a year out from definitive surgery for her left breast cancer with no evidence of disease recurrence.  This is favorable.  She will be completing trastuzumab in 3 months.  She is tolerating it well.  Tolerating anastrozole well enough that she feels she will be able to continue that for total of 5 years.  She will have a repeat echocardiogram towards the end of December.  I offered April [name] our sympathy on her mother's passing.  Total encounter time 25 minutes.*   Xylon Croom, Virgie Dad, MD  05/03/21 7:23 PM Medical Oncology and Hematology [facility] [place], [place] [address] Tel. [phone]    Fax. [phone]   I, Wilburn Mylar, am acting as scribe for Dr. Virgie Dad. April [name].  I, Lurline Del MD, have reviewed the above documentation for accuracy and completeness, and I agree with the above.   *Total Encounter Time as defined by the Centers for Medicare and Medicaid Services includes, in addition  to the face-to-face time of a patient visit (documented in the note above) non-face-to-face time: obtaining and reviewing outside history, ordering and reviewing medications, tests or procedures, care coordination (communications with other health care professionals or caregivers) and documentation in the medical record.

## 2021-05-02 NOTE — Therapy (Signed)
[facility] @ [facility] [place], [place], [address] Phone: [phone]   Fax:  [phone]  Physical Therapy Treatment  Patient Details  Name: April [name] MRN: [id number] Date of Birth: Feb 15, 1951 Referring Provider (PT): Dr. Jana Hakim   Encounter Date: 05/02/2021   PT End of Session - 05/02/21 1356     Visit Number 9    Number of Visits 14    Date for PT Re-Evaluation 05/20/21    Authorization Type [employer]    Authorization - Number of Visits 14    PT Start Time 1302    PT Stop Time 1353    PT Time Calculation (min) 51 min    Activity Tolerance Patient tolerated treatment well    Behavior During Therapy [facility] for tasks assessed/performed             Past Medical History:  Diagnosis Date   Breast cancer ([place]) 05/2017   right breast   Eczema    Family history of breast cancer    Family history of ovarian cancer    History of radiation therapy 10/17/17- 11/14/17   40.05 directed to the right breast in 15 fractions, followed by a boost of 10 gy given in 5 fractions.    Hypertension    toxemia during pregnancy, no meds now    Past Surgical History:  Procedure Laterality Date   ABDOMINAL HYSTERECTOMY     BREAST LUMPECTOMY WITH RADIOACTIVE SEED AND SENTINEL LYMPH NODE BIOPSY Right 07/17/2017   Procedure: BREAST LUMPECTOMY WITH RADIOACTIVE SEED AND SENTINEL LYMPH NODE BIOPSY;  Surgeon: Rolm Bookbinder, MD;  Location: [place];  Service: General;  Laterality: Right;   BREAST LUMPECTOMY WITH RADIOACTIVE SEED AND SENTINEL LYMPH NODE BIOPSY Left 07/13/2020   Procedure: LEFT BREAST LUMPECTOMY WITH RADIOACTIVE SEED AND LEFT AXILLARY SENTINEL LYMPH NODE BIOPSY;  Surgeon: Rolm Bookbinder, MD;  Location: [place];  Service: General;  Laterality: Left;   CESAREAN SECTION     x4   DILATION AND CURETTAGE OF UTERUS     KNEE SURGERY Right    PORT-A-CATH REMOVAL Right 09/04/2018    Procedure: REMOVAL PORT-A-CATH;  Surgeon: Rolm Bookbinder, MD;  Location: [place];  Service: General;  Laterality: Right;   PORTACATH PLACEMENT N/A 07/17/2017   Procedure: INSERTION PORT-A-CATH WITH [place];  Surgeon: Rolm Bookbinder, MD;  Location: [place];  Service: General;  Laterality: N/A;   PORTACATH PLACEMENT Right 07/13/2020   Procedure: INSERTION PORT-A-CATH WITH ULTRASOUND GUIDANCE;  Surgeon: Rolm Bookbinder, MD;  Location: [place];  Service: General;  Laterality: Right;    There were no vitals filed for this visit.   Subjective Assessment - 05/02/21 1302     Subjective Think the breast swelling is doing better.  I did MLD Saturday and the vibration plate Sunday and wearing compression bra. My breast is softer but feels less heavy too. The armpit area seems better too. I did not get to try the execises from Friday.    Pertinent History Pt with prior history of right breast cancer  with  with surgery on 07/17/17 for right lumpectomy and SLNB with 5 LN removed.  Had chemo and radiation. Most recent surgery1/25/22 for left breast lumpectomy with 0/5 positive nodes. She will have chemo, radiation and infusions for immunotherapy.  [facility] Adult PT Treatment/Exercise - 05/02/21 0001       Exercises   Other Exercises  Warmed up with arm swings, circles, abd/add.  PErformed exercises 15- 26 in ABC strength book with 2# weights x 10 reps each bilaterally.                         PT Long Term Goals - 04/22/21 1126       PT LONG TERM GOAL #1   Title Patient will demonstrate she has returned to baseline since surgery related to shoulder ROM and function.    Time 6    Period Weeks    Status Achieved    Target Date 04/22/21      PT LONG TERM GOAL #2   Title Pt will be independent in left breast MLD    Time 4    Period Weeks    Status On-going    Target Date 05/19/21       PT LONG TERM GOAL #3   Title Pt will report decreased breast heaviness by 50% or greater    Baseline 40%    Time 4    Period Weeks    Status On-going    Target Date 05/20/21      PT LONG TERM GOAL #4   Title Pt will be independent with scar massage                   Plan - 05/02/21 1356     Clinical Impression Statement pt was taken through the remainder of the ABC strength handout.  She did require occasional VC's to perform exercises correctly, and had the most difficulty with rows, mini squats and dead lift.  We left dead lift out of her HEP.  She was able to improve the others with VC's and TC's for proper positioning.  Asked pt to perform atleast 1x before she returns on Friday.  Reviewed warm ups and proper breathing technique. Pt does feel her breast has been softer and less heavy.    Personal Factors and Comorbidities Comorbidity 2    Comorbidities Hx of bilateral breast CA with new onset of left Breast CA. ( right in 2019), lymphedema risk    Stability/Clinical Decision Making Stable/Uncomplicated    Rehab Potential Excellent    PT Frequency 2x / week    PT Duration 6 weeks    PT Treatment/Interventions ADLs/Self Care Home Management;Therapeutic exercise;Patient/family education;Manual techniques;Manual lymph drainage;Passive range of motion;Spinal Manipulations    PT Next Visit Plan Breast MLD and review Self MLD, ABC strength;review prn, peach foam in bra for lower breast?    PT Home Exercise Plan Supine wand, star gazer, abduction wall slides, wear compression bra    Consulted and Agree with Plan of Care Patient             Patient will benefit from skilled therapeutic intervention in order to improve the following deficits and impairments:  Postural dysfunction, Decreased knowledge of precautions, Increased edema, Decreased range of motion, Decreased strength, Pain  Visit Diagnosis: Malignant neoplasm of upper-outer quadrant of left female breast,  unspecified estrogen receptor status ([place])  Aftercare following surgery for neoplasm  Edema, unspecified type  Abnormal posture     Problem List Patient Active Problem List   Diagnosis Date Noted   Age-related osteoporosis without current pathological fracture 09/08/2020   Allergic rhinitis 09/08/2020   Palpitations 09/08/2020   Personal history of malignant neoplasm of breast 09/08/2020  Personal history of transient ischemic attack (TIA), and cerebral infarction without residual deficits 09/08/2020   Pure hypercholesterolemia 09/08/2020   Vitamin D deficiency 09/08/2020   Neuropathy due to chemotherapeutic drug ([place]) 07/23/2018   Port-A-Cath in place 08/28/2017   TIA (transient ischemic attack) 08/09/2017   Hypertension    Genetic testing 07/06/2017   Family history of ovarian cancer    Family history of breast cancer    Malignant neoplasm of upper-outer quadrant of right breast in female, estrogen receptor negative ([place]) 06/26/2017   Malignant neoplasm of left breast, estrogen receptor positive ([place]) 05/19/2017    Claris Pong, PT 05/02/2021, 2:00 PM  [facility] @ [facility] [place], [place], [address] Phone: [phone]   Fax:  [phone]  Name: April [name] MRN: [id number] Date of Birth: [birth year]

## 2021-05-03 ENCOUNTER — Inpatient Hospital Stay (HOSPITAL_BASED_OUTPATIENT_CLINIC_OR_DEPARTMENT_OTHER): Payer: Medicare PPO | Admitting: Oncology

## 2021-05-03 ENCOUNTER — Inpatient Hospital Stay: Payer: Medicare PPO | Attending: Oncology

## 2021-05-03 ENCOUNTER — Inpatient Hospital Stay: Payer: Medicare PPO

## 2021-05-03 VITALS — BP 146/58 | HR 73 | Temp 98.1°F | Resp 17 | Wt 150.6 lb

## 2021-05-03 DIAGNOSIS — I10 Essential (primary) hypertension: Secondary | ICD-10-CM | POA: Insufficient documentation

## 2021-05-03 DIAGNOSIS — Z886 Allergy status to analgesic agent status: Secondary | ICD-10-CM | POA: Diagnosis not present

## 2021-05-03 DIAGNOSIS — Z170 Estrogen receptor positive status [ER+]: Secondary | ICD-10-CM | POA: Insufficient documentation

## 2021-05-03 DIAGNOSIS — C50012 Malignant neoplasm of nipple and areola, left female breast: Secondary | ICD-10-CM | POA: Diagnosis not present

## 2021-05-03 DIAGNOSIS — Z5112 Encounter for antineoplastic immunotherapy: Secondary | ICD-10-CM | POA: Insufficient documentation

## 2021-05-03 DIAGNOSIS — Z888 Allergy status to other drugs, medicaments and biological substances status: Secondary | ICD-10-CM | POA: Diagnosis not present

## 2021-05-03 DIAGNOSIS — C50411 Malignant neoplasm of upper-outer quadrant of right female breast: Secondary | ICD-10-CM

## 2021-05-03 DIAGNOSIS — Z803 Family history of malignant neoplasm of breast: Secondary | ICD-10-CM | POA: Diagnosis not present

## 2021-05-03 DIAGNOSIS — Z8041 Family history of malignant neoplasm of ovary: Secondary | ICD-10-CM | POA: Diagnosis not present

## 2021-05-03 DIAGNOSIS — Z923 Personal history of irradiation: Secondary | ICD-10-CM | POA: Insufficient documentation

## 2021-05-03 DIAGNOSIS — Z171 Estrogen receptor negative status [ER-]: Secondary | ICD-10-CM

## 2021-05-03 DIAGNOSIS — Z79811 Long term (current) use of aromatase inhibitors: Secondary | ICD-10-CM | POA: Diagnosis not present

## 2021-05-03 DIAGNOSIS — Z882 Allergy status to sulfonamides status: Secondary | ICD-10-CM | POA: Diagnosis not present

## 2021-05-03 DIAGNOSIS — Z8042 Family history of malignant neoplasm of prostate: Secondary | ICD-10-CM | POA: Insufficient documentation

## 2021-05-03 DIAGNOSIS — Z95828 Presence of other vascular implants and grafts: Secondary | ICD-10-CM

## 2021-05-03 DIAGNOSIS — C50212 Malignant neoplasm of upper-inner quadrant of left female breast: Secondary | ICD-10-CM | POA: Insufficient documentation

## 2021-05-03 DIAGNOSIS — Z8249 Family history of ischemic heart disease and other diseases of the circulatory system: Secondary | ICD-10-CM | POA: Insufficient documentation

## 2021-05-03 LAB — CBC WITH DIFFERENTIAL (CANCER CENTER ONLY)
Abs Immature Granulocytes: 0.01 10*3/uL (ref 0.00–0.07)
Basophils Absolute: 0 10*3/uL (ref 0.0–0.1)
Basophils Relative: 1 %
Eosinophils Absolute: 0.1 10*3/uL (ref 0.0–0.5)
Eosinophils Relative: 3 %
HCT: 32.3 % — ABNORMAL LOW (ref 36.0–46.0)
Hemoglobin: 10.7 g/dL — ABNORMAL LOW (ref 12.0–15.0)
Immature Granulocytes: 0 %
Lymphocytes Relative: 32 %
Lymphs Abs: 1.1 10*3/uL (ref 0.7–4.0)
MCH: 29.6 pg (ref 26.0–34.0)
MCHC: 33.1 g/dL (ref 30.0–36.0)
MCV: 89.2 fL (ref 80.0–100.0)
Monocytes Absolute: 0.3 10*3/uL (ref 0.1–1.0)
Monocytes Relative: 8 %
Neutro Abs: 1.8 10*3/uL (ref 1.7–7.7)
Neutrophils Relative %: 56 %
Platelet Count: 227 10*3/uL (ref 150–400)
RBC: 3.62 MIL/uL — ABNORMAL LOW (ref 3.87–5.11)
RDW: 12.7 % (ref 11.5–15.5)
WBC Count: 3.3 10*3/uL — ABNORMAL LOW (ref 4.0–10.5)
nRBC: 0 % (ref 0.0–0.2)

## 2021-05-03 LAB — CMP (CANCER CENTER ONLY)
ALT: 21 U/L (ref 0–44)
AST: 20 U/L (ref 15–41)
Albumin: 3.9 g/dL (ref 3.5–5.0)
Alkaline Phosphatase: 75 U/L (ref 38–126)
Anion gap: 10 (ref 5–15)
BUN: 15 mg/dL (ref 8–23)
CO2: 25 mmol/L (ref 22–32)
Calcium: 9.4 mg/dL (ref 8.9–10.3)
Chloride: 106 mmol/L (ref 98–111)
Creatinine: 1.02 mg/dL — ABNORMAL HIGH (ref 0.44–1.00)
GFR, Estimated: 59 mL/min — ABNORMAL LOW (ref >60)
Glucose, Bld: 127 mg/dL — ABNORMAL HIGH (ref 70–99)
Potassium: 3.4 mmol/L — ABNORMAL LOW (ref 3.5–5.1)
Sodium: 141 mmol/L (ref 135–145)
Total Bilirubin: 0.2 mg/dL — ABNORMAL LOW (ref 0.3–1.2)
Total Protein: 7.3 g/dL (ref 6.5–8.1)

## 2021-05-03 MED ORDER — ACETAMINOPHEN 325 MG PO TABS
650.0000 mg | ORAL_TABLET | Freq: Once | ORAL | Status: AC
  Administered 2021-05-03: 650 mg via ORAL
  Filled 2021-05-03: qty 2

## 2021-05-03 MED ORDER — SODIUM CHLORIDE 0.9 % IV SOLN: Freq: Once | INTRAVENOUS | Status: AC

## 2021-05-03 MED ORDER — DIPHENHYDRAMINE HCL 25 MG PO CAPS
25.0000 mg | ORAL_CAPSULE | Freq: Once | ORAL | Status: AC
  Administered 2021-05-03: 25 mg via ORAL
  Filled 2021-05-03: qty 1

## 2021-05-03 MED ORDER — TRASTUZUMAB-ANNS CHEMO 150 MG IV SOLR
6.0000 mg/kg | Freq: Once | INTRAVENOUS | Status: AC
  Administered 2021-05-03: 420 mg via INTRAVENOUS
  Filled 2021-05-03: qty 20

## 2021-05-03 MED ORDER — HEPARIN SOD (PORK) LOCK FLUSH 100 UNIT/ML IV SOLN
500.0000 [IU] | Freq: Once | INTRAVENOUS | Status: AC | PRN
  Administered 2021-05-03: 500 [IU]

## 2021-05-03 MED ORDER — SODIUM CHLORIDE 0.9% FLUSH
10.0000 mL | INTRAVENOUS | Status: DC | PRN
  Administered 2021-05-03: 10 mL

## 2021-05-03 MED ORDER — SODIUM CHLORIDE 0.9% FLUSH
10.0000 mL | Freq: Once | INTRAVENOUS | Status: AC
  Administered 2021-05-03: 10 mL

## 2021-05-03 NOTE — Patient Instructions (Signed)
[facility]  Discharge Instructions: ?Thank you for choosing [facility] to provide your oncology and hematology care.  ? ?If you have a lab appointment with the Cancer Center, please go directly to the Cancer Center and check in at the registration area. ?  ?Wear comfortable clothing and clothing appropriate for easy access to any Portacath or PICC line.  ? ?We strive to give you quality time with your provider. You may need to reschedule your appointment if you arrive late (15 or more minutes).  Arriving late affects you and other patients whose appointments are after yours.  Also, if you miss three or more appointments without notifying the office, you may be dismissed from the clinic at the provider?s discretion.    ?  ?For prescription refill requests, have your pharmacy contact our office and allow 72 hours for refills to be completed.   ? ?Today you received the following chemotherapy and/or immunotherapy agents: Trastuzumab    ?  ?To help prevent nausea and vomiting after your treatment, we encourage you to take your nausea medication as directed. ? ?BELOW ARE SYMPTOMS THAT SHOULD BE REPORTED IMMEDIATELY: ?*FEVER GREATER THAN 100.4 F (38 ?C) OR HIGHER ?*CHILLS OR SWEATING ?*NAUSEA AND VOMITING THAT IS NOT CONTROLLED WITH YOUR NAUSEA MEDICATION ?*UNUSUAL SHORTNESS OF BREATH ?*UNUSUAL BRUISING OR BLEEDING ?*URINARY PROBLEMS (pain or burning when urinating, or frequent urination) ?*BOWEL PROBLEMS (unusual diarrhea, constipation, pain near the anus) ?TENDERNESS IN MOUTH AND THROAT WITH OR WITHOUT PRESENCE OF ULCERS (sore throat, sores in mouth, or a toothache) ?UNUSUAL RASH, SWELLING OR PAIN  ?UNUSUAL VAGINAL DISCHARGE OR ITCHING  ? ?Items with * indicate a potential emergency and should be followed up as soon as possible or go to the Emergency Department if any problems should occur. ? ?Please show the CHEMOTHERAPY ALERT CARD or IMMUNOTHERAPY ALERT CARD at check-in  to the Emergency Department and triage nurse. ? ?Should you have questions after your visit or need to cancel or reschedule your appointment, please contact [facility]  Dept: [phone]  and follow the prompts.  Office hours are 8:00 a.m. to 4:30 p.m. Monday - Friday. Please note that voicemails left after 4:00 p.m. may not be returned until the following business day.  We are closed weekends and major holidays. You have access to a nurse at all times for urgent questions. Please call the main number to the clinic Dept: [phone] and follow the prompts. ? ? ?For any non-urgent questions, you may also contact your provider using MyChart. We now offer e-Visits for anyone 18 and older to request care online for non-urgent symptoms. For details visit [email or web address]. ?  ?Also download the MyChart app! Go to the app store, search "MyChart", open the app, select , and log in with your MyChart username and password. ? ?Due to Covid, a mask is required upon entering the hospital/clinic. If you do not have a mask, one will be given to you upon arrival. For doctor visits, patients may have 1 support person aged 18 or older with them. For treatment visits, patients cannot have anyone with them due to current Covid guidelines and our immunocompromised population.  ? ?

## 2021-05-06 ENCOUNTER — Other Ambulatory Visit: Payer: Self-pay

## 2021-05-06 ENCOUNTER — Ambulatory Visit: Payer: Medicare PPO

## 2021-05-06 DIAGNOSIS — Z483 Aftercare following surgery for neoplasm: Secondary | ICD-10-CM

## 2021-05-06 DIAGNOSIS — C50412 Malignant neoplasm of upper-outer quadrant of left female breast: Secondary | ICD-10-CM

## 2021-05-06 DIAGNOSIS — R609 Edema, unspecified: Secondary | ICD-10-CM | POA: Diagnosis not present

## 2021-05-06 DIAGNOSIS — R293 Abnormal posture: Secondary | ICD-10-CM | POA: Diagnosis not present

## 2021-05-06 NOTE — Therapy (Signed)
[facility] @ [address] [place], [place], [address] Phone: [phone]   Fax:  [phone]  Physical Therapy Treatment  Patient Details  Name: April [name] MRN: [id number] Date of Birth: Mar 07, 1951 Referring Provider (PT): Dr. Jana Hakim   Encounter Date: 05/06/2021   PT End of Session - 05/06/21 0920     Visit Number 10    Number of Visits 14    Date for PT Re-Evaluation 05/20/21    Authorization Type [employer]    PT Start Time 0908    PT Stop Time 0956    PT Time Calculation (min) 48 min    Activity Tolerance Patient tolerated treatment well    Behavior During Therapy [facility] for tasks assessed/performed             Past Medical History:  Diagnosis Date   Breast cancer ([place]) 05/2017   right breast   Eczema    Family history of breast cancer    Family history of ovarian cancer    History of radiation therapy 10/17/17- 11/14/17   40.05 directed to the right breast in 15 fractions, followed by a boost of 10 gy given in 5 fractions.    Hypertension    toxemia during pregnancy, no meds now    Past Surgical History:  Procedure Laterality Date   ABDOMINAL HYSTERECTOMY     BREAST LUMPECTOMY WITH RADIOACTIVE SEED AND SENTINEL LYMPH NODE BIOPSY Right 07/17/2017   Procedure: BREAST LUMPECTOMY WITH RADIOACTIVE SEED AND SENTINEL LYMPH NODE BIOPSY;  Surgeon: Rolm Bookbinder, MD;  Location: [facility];  Service: General;  Laterality: Right;   BREAST LUMPECTOMY WITH RADIOACTIVE SEED AND SENTINEL LYMPH NODE BIOPSY Left 07/13/2020   Procedure: LEFT BREAST LUMPECTOMY WITH RADIOACTIVE SEED AND LEFT AXILLARY SENTINEL LYMPH NODE BIOPSY;  Surgeon: Rolm Bookbinder, MD;  Location: [facility];  Service: General;  Laterality: Left;   CESAREAN SECTION     x4   DILATION AND CURETTAGE OF UTERUS     KNEE SURGERY Right    PORT-A-CATH REMOVAL Right 09/04/2018   Procedure: REMOVAL PORT-A-CATH;  Surgeon:  Rolm Bookbinder, MD;  Location: [facility];  Service: General;  Laterality: Right;   PORTACATH PLACEMENT N/A 07/17/2017   Procedure: INSERTION PORT-A-CATH WITH [place];  Surgeon: Rolm Bookbinder, MD;  Location: [facility];  Service: General;  Laterality: N/A;   PORTACATH PLACEMENT Right 07/13/2020   Procedure: INSERTION PORT-A-CATH WITH ULTRASOUND GUIDANCE;  Surgeon: Rolm Bookbinder, MD;  Location: [facility];  Service: General;  Laterality: Right;    There were no vitals filed for this visit.   Subjective Assessment - 05/06/21 0905     Subjective I had treatment on Tuesday and did MLD on Tues.  I did vibration plate on Wed for 30 min. I ate something on Wed that didn't agree with me and I was up for 2 days with stomach upset. I layed around yesterday. I can tell my breast is softer.  Jinny Blossom is coming out on Monday to do the flexitouch trial.    Pertinent History Pt with prior history of right breast cancer  with  with surgery on 07/17/17 for right lumpectomy and SLNB with 5 LN removed.  Had chemo and radiation. Most recent surgery1/25/22 for left breast lumpectomy with 0/5 positive nodes. She will have chemo, radiation and infusions for immunotherapy.    Patient Stated Goals Want to decrease left breast swelling, decrease tightness left shoulder/axilla  Currently in Pain? No/denies    Pain Score 0-No pain                               [facility] Adult PT Treatment/Exercise - 05/06/21 0001       Manual Therapy   Myofascial Release MFR left axillary region and medial upper arm    Manual Lymphatic Drainage (MLD) Therapist performed supraclavicular, 5 resisted diaphragmatic breaths, Left axillary LN, left inguinal LN's, left axillo-inguinal pathway,  and left breast and axilla in supine  and  retracing all steps and ending with LN's                          PT Long Term Goals - 04/22/21 1126       PT LONG TERM  GOAL #1   Title Patient will demonstrate she has returned to baseline since surgery related to shoulder ROM and function.    Time 6    Period Weeks    Status Achieved    Target Date 04/22/21      PT LONG TERM GOAL #2   Title Pt will be independent in left breast MLD    Time 4    Period Weeks    Status On-going    Target Date 05/19/21      PT LONG TERM GOAL #3   Title Pt will report decreased breast heaviness by 50% or greater    Baseline 40%    Time 4    Period Weeks    Status On-going    Target Date 05/20/21      PT LONG TERM GOAL #4   Title Pt will be independent with scar massage                   Plan - 05/06/21 0921     Clinical Impression Statement Pt observed to have cording in left axillary region today so initiated MFR to axilla to decrease cording. PT performed left breast MLD and reviewed with pt.  Pt had been sick for several days so did not review any ABC exercises.  Advised to try some over the weekend if she feels up to it.  Pt has a Flexi touch demo on Monday at her home.    Personal Factors and Comorbidities Comorbidity 2    Comorbidities Hx of bilateral breast CA with new onset of left Breast CA. ( right in 2019), lymphedema risk    Stability/Clinical Decision Making Stable/Uncomplicated    Rehab Potential Excellent    PT Frequency 2x / week    PT Duration 6 weeks    PT Treatment/Interventions ADLs/Self Care Home Management;Therapeutic exercise;Patient/family education;Manual techniques;Manual lymph drainage;Passive range of motion;Spinal Manipulations    PT Next Visit Plan Breast MLD and review Self MLD, ABC strength;review prn, peach foam in bra for lower breast?    PT Home Exercise Plan Supine wand, star gazer, abduction wall slides, wear compression bra    Consulted and Agree with Plan of Care Patient             Patient will benefit from skilled therapeutic intervention in order to improve the following deficits and impairments:  Postural  dysfunction, Decreased knowledge of precautions, Increased edema, Decreased range of motion, Decreased strength, Pain  Visit Diagnosis: Malignant neoplasm of upper-outer quadrant of left female breast, unspecified estrogen receptor status ([place])  Aftercare following surgery for neoplasm  Edema, unspecified  type  Abnormal posture     Problem List Patient Active Problem List   Diagnosis Date Noted   Age-related osteoporosis without current pathological fracture 09/08/2020   Allergic rhinitis 09/08/2020   Palpitations 09/08/2020   Personal history of malignant neoplasm of breast 09/08/2020   Personal history of transient ischemic attack (TIA), and cerebral infarction without residual deficits 09/08/2020   Pure hypercholesterolemia 09/08/2020   Vitamin D deficiency 09/08/2020   Neuropathy due to chemotherapeutic drug ([place]) 07/23/2018   Port-A-Cath in place 08/28/2017   TIA (transient ischemic attack) 08/09/2017   Hypertension    Genetic testing 07/06/2017   Family history of ovarian cancer    Family history of breast cancer    Malignant neoplasm of upper-outer quadrant of right breast in female, estrogen receptor negative ([place]) 06/26/2017   Malignant neoplasm of left breast, estrogen receptor positive ([place]) 05/19/2017    Claris Pong, PT 05/06/2021, 12:29 PM  [facility] @ [address] [place] [address] [place], [place], [address] Phone: [phone]   Fax:  [phone]  Name: NARCISSUS DETWILER MRN: [id number] Date of Birth: May 16, 1951

## 2021-05-10 ENCOUNTER — Other Ambulatory Visit: Payer: Self-pay

## 2021-05-10 ENCOUNTER — Ambulatory Visit: Payer: Medicare PPO

## 2021-05-10 DIAGNOSIS — R293 Abnormal posture: Secondary | ICD-10-CM

## 2021-05-10 DIAGNOSIS — R609 Edema, unspecified: Secondary | ICD-10-CM

## 2021-05-10 DIAGNOSIS — Z483 Aftercare following surgery for neoplasm: Secondary | ICD-10-CM | POA: Diagnosis not present

## 2021-05-10 DIAGNOSIS — C50412 Malignant neoplasm of upper-outer quadrant of left female breast: Secondary | ICD-10-CM

## 2021-05-10 NOTE — Therapy (Addendum)
[facility] @ [employer] [place], [place], [address] Phone: [phone]   Fax:  [phone]  Physical Therapy Treatment  Patient Details  Name: April [name] MRN: [id number] Date of Birth: October 16, 1950 Referring Provider (PT): Dr. Jana Hakim   Encounter Date: 05/10/2021   PT End of Session - 05/10/21 1117     Visit Number 11    Number of Visits 17    Date for PT Re-Evaluation 05/31/21    Authorization Type [employer]    Authorization - Number of Visits 14    PT Start Time 1102    PT Stop Time 1153    PT Time Calculation (min) 51 min    Activity Tolerance Patient tolerated treatment well    Behavior During Therapy [facility] for tasks assessed/performed             Past Medical History:  Diagnosis Date   Breast cancer ([place]) 05/2017   right breast   Eczema    Family history of breast cancer    Family history of ovarian cancer    History of radiation therapy 10/17/17- 11/14/17   40.05 directed to the right breast in 15 fractions, followed by a boost of 10 gy given in 5 fractions.    Hypertension    toxemia during pregnancy, no meds now    Past Surgical History:  Procedure Laterality Date   ABDOMINAL HYSTERECTOMY     BREAST LUMPECTOMY WITH RADIOACTIVE SEED AND SENTINEL LYMPH NODE BIOPSY Right 07/17/2017   Procedure: BREAST LUMPECTOMY WITH RADIOACTIVE SEED AND SENTINEL LYMPH NODE BIOPSY;  Surgeon: Rolm Bookbinder, MD;  Location: [facility];  Service: General;  Laterality: Right;   BREAST LUMPECTOMY WITH RADIOACTIVE SEED AND SENTINEL LYMPH NODE BIOPSY Left 07/13/2020   Procedure: LEFT BREAST LUMPECTOMY WITH RADIOACTIVE SEED AND LEFT AXILLARY SENTINEL LYMPH NODE BIOPSY;  Surgeon: Rolm Bookbinder, MD;  Location: [facility];  Service: General;  Laterality: Left;   CESAREAN SECTION     x4   DILATION AND CURETTAGE OF UTERUS     KNEE SURGERY Right    PORT-A-CATH REMOVAL Right 09/04/2018    Procedure: REMOVAL PORT-A-CATH;  Surgeon: Rolm Bookbinder, MD;  Location: [facility];  Service: General;  Laterality: Right;   PORTACATH PLACEMENT N/A 07/17/2017   Procedure: INSERTION PORT-A-CATH WITH [place];  Surgeon: Rolm Bookbinder, MD;  Location: [facility];  Service: General;  Laterality: N/A;   PORTACATH PLACEMENT Right 07/13/2020   Procedure: INSERTION PORT-A-CATH WITH ULTRASOUND GUIDANCE;  Surgeon: Rolm Bookbinder, MD;  Location: [facility];  Service: General;  Laterality: Right;    There were no vitals filed for this visit.   Subjective Assessment - 05/10/21 1101     Subjective I had the flexi touch trial yesterday and I loved it. My breast was so much softer today. My breast looked a little smaller also. I did my MLD last night too.  I did my vibration plate with exercises too. Breast heaviness is 75% better    Pertinent History Pt with prior history of right breast cancer  with  with surgery on 07/17/17 for right lumpectomy and SLNB with 5 LN removed.  Had chemo and radiation. Most recent surgery1/25/22 for left breast lumpectomy with 0/5 positive nodes. She will have chemo, radiation and infusions for immunotherapy.    Patient Stated Goals Want to decrease left breast swelling, decrease tightness left shoulder/axilla    Currently in Pain? No/denies  Pain Score 0-No pain    Multiple Pain Sites No                               [facility] Adult PT Treatment/Exercise - 05/10/21 0001       Manual Therapy   Myofascial Release MFR left axillary region and medial upper arm    Manual Lymphatic Drainage (MLD) Pt. performed supraclavicular, 5 resisted diaphragmatic breaths, Left axillary LN, left inguinal LN's, left sternal nodes,left axillo-inguinal pathway,  and left breast and axilla in supine  and  retracing all steps and ending with LN's. pt required few VC's and TC's and did very well overall                           PT Long Term Goals - 05/10/21 1114       PT LONG TERM GOAL #1   Title Patient will demonstrate she has returned to baseline since surgery related to shoulder ROM and function.    Time 6    Period Weeks    Status Achieved      PT LONG TERM GOAL #2   Title Pt will be independent in left breast MLD    Time 4    Period Weeks    Status Achieved      PT LONG TERM GOAL #3   Title Pt will report decreased breast heaviness by 50% or greater    Baseline 75%    Time 4    Period Weeks    Status Achieved      PT LONG TERM GOAL #4   Title Pt will be independent with scar massage    Time 2    Period Weeks    Status Achieved      PT LONG TERM GOAL #5   Title Pt will have no limitations in left shoulder from new onset of cording    Time 3    Period Weeks    Status New    Target Date 05/31/21                   Plan - 05/10/21 1118     Clinical Impression Statement Pt continues with mild cording in the left axillary region that does not appear to affect her ROM.  Several small pieces of peach foam were given to pt to place under the breast prn.  She will try over the next week and see if it will help and will stay in. We reviewed self MLD today and pt is using very good form.  SHe has achieved goals established but will check in with me after her [place] trip to reassess cording and breast swelling. She did very well with the flexi touch and noted decreased swelling and increased softness in the breast afterwards.    Personal Factors and Comorbidities Comorbidity 2    Comorbidities Hx of bilateral breast CA with new onset of left Breast CA. ( right in 2019), lymphedema risk    Stability/Clinical Decision Making Stable/Uncomplicated    Rehab Potential Excellent    PT Frequency 2x / week    PT Duration 3 weeks    PT Treatment/Interventions ADLs/Self Care Home Management;Therapeutic exercise;Patient/family education;Manual techniques;Manual lymph  drainage;Passive range of motion;Spinal Manipulations;Vasopneumatic Device    PT Next Visit Plan Breast MLD and review Self MLD, ABC strength;review prn, peach foam in bra for lower breast?  PT Home Exercise Plan Supine wand, star gazer, abduction wall slides, wear compression bra    Consulted and Agree with Plan of Care Patient             Patient will benefit from skilled therapeutic intervention in order to improve the following deficits and impairments:  Postural dysfunction, Decreased knowledge of precautions, Increased edema, Decreased range of motion, Decreased strength, Pain  Visit Diagnosis: Malignant neoplasm of upper-outer quadrant of left female breast, unspecified estrogen receptor status ([place])  Aftercare following surgery for neoplasm  Edema, unspecified type  Abnormal posture     Problem List Patient Active Problem List   Diagnosis Date Noted   Age-related osteoporosis without current pathological fracture 09/08/2020   Allergic rhinitis 09/08/2020   Palpitations 09/08/2020   Personal history of malignant neoplasm of breast 09/08/2020   Personal history of transient ischemic attack (TIA), and cerebral infarction without residual deficits 09/08/2020   Pure hypercholesterolemia 09/08/2020   Vitamin D deficiency 09/08/2020   Neuropathy due to chemotherapeutic drug ([place]) 07/23/2018   Port-A-Cath in place 08/28/2017   TIA (transient ischemic attack) 08/09/2017   Hypertension    Genetic testing 07/06/2017   Family history of ovarian cancer    Family history of breast cancer    Malignant neoplasm of upper-outer quadrant of right breast in female, estrogen receptor negative ([place]) 06/26/2017   Malignant neoplasm of left breast, estrogen receptor positive ([place]) 05/19/2017    Claris Pong, PT 05/10/2021, 12:06 PM  [facility] @ [place], [place], [address] Phone: [phone]   Fax:   [phone]  Name: April [name] MRN: [id number] Date of Birth: Oct 08, 1950

## 2021-05-10 NOTE — Addendum Note (Signed)
Addended by: Claris Pong on: 05/10/2021 12:27 PM   Modules accepted: Orders

## 2021-05-24 ENCOUNTER — Other Ambulatory Visit: Payer: Medicare PPO

## 2021-05-24 ENCOUNTER — Ambulatory Visit: Payer: Medicare PPO

## 2021-05-27 ENCOUNTER — Other Ambulatory Visit: Payer: Self-pay

## 2021-05-27 ENCOUNTER — Ambulatory Visit: Payer: Medicare PPO | Attending: Oncology

## 2021-05-27 DIAGNOSIS — Z483 Aftercare following surgery for neoplasm: Secondary | ICD-10-CM | POA: Insufficient documentation

## 2021-05-27 DIAGNOSIS — C50412 Malignant neoplasm of upper-outer quadrant of left female breast: Secondary | ICD-10-CM | POA: Diagnosis not present

## 2021-05-27 DIAGNOSIS — R609 Edema, unspecified: Secondary | ICD-10-CM | POA: Insufficient documentation

## 2021-05-27 DIAGNOSIS — R293 Abnormal posture: Secondary | ICD-10-CM | POA: Insufficient documentation

## 2021-05-27 NOTE — Therapy (Addendum)
[facility] @ [address] [place], [place], [address] Phone: [phone]   Fax:  [phone]  Physical Therapy Treatment  Patient Details  Name: April [name] MRN: [id number] Date of Birth: October 14, 1950 Referring Provider (PT): Dr. Jana Hakim   Encounter Date: 05/27/2021   PT End of Session - 05/27/21 1102     Visit Number 12    Number of Visits 14    Date for PT Re-Evaluation 05/31/21    Authorization Type [employer]    PT Start Time 1004    PT Stop Time 1059    PT Time Calculation (min) 55 min    Activity Tolerance Patient tolerated treatment well    Behavior During Therapy [facility] for tasks assessed/performed             Past Medical History:  Diagnosis Date   Breast cancer ([place]) 05/2017   right breast   Eczema    Family history of breast cancer    Family history of ovarian cancer    History of radiation therapy 10/17/17- 11/14/17   40.05 directed to the right breast in 15 fractions, followed by a boost of 10 gy given in 5 fractions.    Hypertension    toxemia during pregnancy, no meds now    Past Surgical History:  Procedure Laterality Date   ABDOMINAL HYSTERECTOMY     BREAST LUMPECTOMY WITH RADIOACTIVE SEED AND SENTINEL LYMPH NODE BIOPSY Right 07/17/2017   Procedure: BREAST LUMPECTOMY WITH RADIOACTIVE SEED AND SENTINEL LYMPH NODE BIOPSY;  Surgeon: Rolm Bookbinder, MD;  Location: [facility];  Service: General;  Laterality: Right;   BREAST LUMPECTOMY WITH RADIOACTIVE SEED AND SENTINEL LYMPH NODE BIOPSY Left 07/13/2020   Procedure: LEFT BREAST LUMPECTOMY WITH RADIOACTIVE SEED AND LEFT AXILLARY SENTINEL LYMPH NODE BIOPSY;  Surgeon: Rolm Bookbinder, MD;  Location: [facility];  Service: General;  Laterality: Left;   CESAREAN SECTION     x4   DILATION AND CURETTAGE OF UTERUS     KNEE SURGERY Right    PORT-A-CATH REMOVAL Right 09/04/2018   Procedure: REMOVAL PORT-A-CATH;  Surgeon:  Rolm Bookbinder, MD;  Location: [facility];  Service: General;  Laterality: Right;   PORTACATH PLACEMENT N/A 07/17/2017   Procedure: INSERTION PORT-A-CATH WITH [place];  Surgeon: Rolm Bookbinder, MD;  Location: [facility];  Service: General;  Laterality: N/A;   PORTACATH PLACEMENT Right 07/13/2020   Procedure: INSERTION PORT-A-CATH WITH ULTRASOUND GUIDANCE;  Surgeon: Rolm Bookbinder, MD;  Location: [facility];  Service: General;  Laterality: Right;    There were no vitals filed for this visit.   Subjective Assessment - 05/27/21 1004     Subjective I didn't have any real problems when I was in [place] with breast heaviness or swelling.  I wore my compression sleeve on both arms on the way there, and my compression bra,but I forgot until we were about an hour from landing to wear my sleeves on the way home.    Pertinent History Pt with prior history of right breast cancer  with  with surgery on 07/17/17 for right lumpectomy and SLNB with 5 LN removed.  Had chemo and radiation. Most recent surgery1/25/22 for left breast lumpectomy with 0/5 positive nodes. She will have chemo, radiation and infusions for immunotherapy.    Patient Stated Goals Want to decrease left breast swelling, decrease tightness left shoulder/axilla    Currently in Pain? Yes    Pain Score 1  Pain Location Axilla    Pain Orientation Left    Pain Descriptors / Indicators Sore   with touch   Pain Type Acute pain    Pain Onset 1 to 4 weeks ago    Pain Frequency Intermittent                [facility] PT Assessment - 05/27/21 0001       Assessment   Medical Diagnosis Left Breast Cancer    Referring Provider (PT) Dr. Jana Hakim    Onset Date/Surgical Date 07/13/20    Hand Dominance Right      Prior Function   Level of Independence Independent      Observation/Other Assessments   Observations left breast continues with hyperpigmentation, but with swelling greatly improved  but still present especially at lateral breast. Breast also feels softer               LYMPHEDEMA/ONCOLOGY QUESTIONNAIRE - 05/27/21 0001       Right Upper Extremity Lymphedema   15 cm Proximal to Olecranon Process 31.4 cm    10 cm Proximal to Olecranon Process 29.3 cm    Olecranon Process 24.5 cm    15 cm Proximal to Ulnar Styloid Process 22.7 cm    10 cm Proximal to Ulnar Styloid Process 19.1 cm    Just Proximal to Ulnar Styloid Process 14.3 cm    At Base of 2nd Digit 5.6 cm    Other Chest 99.5    Other      Left Upper Extremity Lymphedema   15 cm Proximal to Olecranon Process 31 cm    10 cm Proximal to Olecranon Process 28.9 cm    Olecranon Process 24.4 cm    15 cm Proximal to Ulnar Styloid Process 21.4 cm    10 cm Proximal to Ulnar Styloid Process 17.9 cm    Just Proximal to Ulnar Styloid Process 14.1 cm    At Base of 2nd Digit 5.4 cm                        [facility] Adult PT Treatment/Exercise - 05/27/21 0001       Manual Therapy   Manual Lymphatic Drainage (MLD) Therapist performed supraclavicular, 5 resisted diaphragmatic breaths, Left axillary LN, left inguinal LN's, left axillo-inguinal pathway,  and left breast and axilla in supine  and  retracing all steps and ending with LN's                          PT Long Term Goals - 05/27/21 1051       PT LONG TERM GOAL #1   Title Patient will demonstrate she has returned to baseline since surgery related to shoulder ROM and function.    Time 6    Period Weeks    Status Achieved      PT LONG TERM GOAL #2   Title Pt will be independent in left breast MLD    Time 4    Period Weeks    Status Achieved      PT LONG TERM GOAL #3   Title Pt will report decreased breast heaviness by 50% or greater    Baseline 75%    Time 4    Period Weeks    Status Achieved      PT LONG TERM GOAL #4   Title Pt will be independent with scar massage    Time 2  Period Weeks    Status Achieved       PT LONG TERM GOAL #5   Title Pt will have no limitations in left shoulder from new onset of cording    Time 3    Status Achieved    Target Date 05/27/21                   Plan - 05/27/21 1103     Clinical Impression Statement Pt has achieved goals established.  Despite achieving her goals she continues with left breast lymphedema that needs to be continually managed to stay under control. She has had over 4 weeks of conservative therapy including compression bra, exercise and MLD,  to treat breast lymphedema that becomes exacerbated. She had a basic pump trial on 05/09/2021 which she failed due to uncomfortable pain and no reduction in swelling.  When she did the Flexi touch trial she had good reduction in chest swelling from 100 to 98 cm.  This pt has been treated on multiple occasions and with continued exacerbations of breast lymphedema with hyperpigmentation, and intermittent hyperkeratosis.  She would benefit from the Flexitouch compression pump to control her lymphedema, reduce her risk for infections and to prevent exacerbations and the increased need for physical therapy.    Personal Factors and Comorbidities Comorbidity 2    Comorbidities Hx of bilateral breast CA with new onset of left Breast CA. ( right in 2019), lymphedema risk    Stability/Clinical Decision Making Stable/Uncomplicated    Rehab Potential Excellent    PT Frequency 2x / week    PT Duration 6 weeks    PT Treatment/Interventions ADLs/Self Care Home Management;Therapeutic exercise;Patient/family education;Manual techniques;Manual lymph drainage;Passive range of motion;Spinal Manipulations;Vasopneumatic Device    PT Next Visit Plan on hold. Awaiting flexitouch    PT Home Exercise Plan Supine wand, star gazer, abduction wall slides, wear compression bra, MLD    Consulted and Agree with Plan of Care Patient             Patient will benefit from skilled therapeutic intervention in order to improve the  following deficits and impairments:  Postural dysfunction, Decreased knowledge of precautions, Increased edema, Decreased range of motion, Decreased strength, Pain  Visit Diagnosis: Malignant neoplasm of upper-outer quadrant of left female breast, unspecified estrogen receptor status ([place])  Aftercare following surgery for neoplasm  Edema, unspecified type  Abnormal posture     Problem List Patient Active Problem List   Diagnosis Date Noted   Age-related osteoporosis without current pathological fracture 09/08/2020   Allergic rhinitis 09/08/2020   Palpitations 09/08/2020   Personal history of malignant neoplasm of breast 09/08/2020   Personal history of transient ischemic attack (TIA), and cerebral infarction without residual deficits 09/08/2020   Pure hypercholesterolemia 09/08/2020   Vitamin D deficiency 09/08/2020   Neuropathy due to chemotherapeutic drug ([place]) 07/23/2018   Port-A-Cath in place 08/28/2017   TIA (transient ischemic attack) 08/09/2017   Hypertension    Genetic testing 07/06/2017   Family history of ovarian cancer    Family history of breast cancer    Malignant neoplasm of upper-outer quadrant of right breast in female, estrogen receptor negative ([place]) 06/26/2017   Malignant neoplasm of left breast, estrogen receptor positive ([place]) 05/19/2017  PHYSICAL THERAPY DISCHARGE SUMMARY  Visits from Start of Care: 12  Current functional level related to goals / functional outcomes: Achieved all goals   Remaining deficits: Intermittent breast swelling   Education / Equipment: Compression bra   Patient agrees  to discharge. Patient goals were met. Patient is being discharged due to meeting the stated rehab goals.   Claris Pong, PT 05/27/2021, 11:20 AM  [facility] @ [address] [place], [place], [address] Phone: [phone]   Fax:  [phone]  Name: April [name] MRN: [id number] Date of  Birth: Dec 07, 1950

## 2021-05-31 ENCOUNTER — Inpatient Hospital Stay: Payer: Medicare PPO

## 2021-05-31 ENCOUNTER — Inpatient Hospital Stay: Payer: Medicare PPO | Attending: Oncology

## 2021-05-31 ENCOUNTER — Other Ambulatory Visit: Payer: Self-pay

## 2021-05-31 VITALS — BP 156/70 | HR 61 | Temp 98.5°F | Resp 18 | Wt 151.0 lb

## 2021-05-31 DIAGNOSIS — Z95828 Presence of other vascular implants and grafts: Secondary | ICD-10-CM

## 2021-05-31 DIAGNOSIS — Z170 Estrogen receptor positive status [ER+]: Secondary | ICD-10-CM

## 2021-05-31 DIAGNOSIS — Z8041 Family history of malignant neoplasm of ovary: Secondary | ICD-10-CM | POA: Insufficient documentation

## 2021-05-31 DIAGNOSIS — I10 Essential (primary) hypertension: Secondary | ICD-10-CM | POA: Diagnosis not present

## 2021-05-31 DIAGNOSIS — Z171 Estrogen receptor negative status [ER-]: Secondary | ICD-10-CM

## 2021-05-31 DIAGNOSIS — Z8249 Family history of ischemic heart disease and other diseases of the circulatory system: Secondary | ICD-10-CM | POA: Diagnosis not present

## 2021-05-31 DIAGNOSIS — Z79899 Other long term (current) drug therapy: Secondary | ICD-10-CM | POA: Diagnosis not present

## 2021-05-31 DIAGNOSIS — C50012 Malignant neoplasm of nipple and areola, left female breast: Secondary | ICD-10-CM

## 2021-05-31 DIAGNOSIS — C50411 Malignant neoplasm of upper-outer quadrant of right female breast: Secondary | ICD-10-CM | POA: Insufficient documentation

## 2021-05-31 DIAGNOSIS — Z5112 Encounter for antineoplastic immunotherapy: Secondary | ICD-10-CM | POA: Diagnosis not present

## 2021-05-31 DIAGNOSIS — Z803 Family history of malignant neoplasm of breast: Secondary | ICD-10-CM | POA: Insufficient documentation

## 2021-05-31 DIAGNOSIS — Z8042 Family history of malignant neoplasm of prostate: Secondary | ICD-10-CM | POA: Diagnosis not present

## 2021-05-31 LAB — CBC WITH DIFFERENTIAL (CANCER CENTER ONLY)
Abs Immature Granulocytes: 0 10*3/uL (ref 0.00–0.07)
Basophils Absolute: 0 10*3/uL (ref 0.0–0.1)
Basophils Relative: 1 %
Eosinophils Absolute: 0.1 10*3/uL (ref 0.0–0.5)
Eosinophils Relative: 4 %
HCT: 32.9 % — ABNORMAL LOW (ref 36.0–46.0)
Hemoglobin: 11.1 g/dL — ABNORMAL LOW (ref 12.0–15.0)
Immature Granulocytes: 0 %
Lymphocytes Relative: 36 %
Lymphs Abs: 1.1 10*3/uL (ref 0.7–4.0)
MCH: 29.8 pg (ref 26.0–34.0)
MCHC: 33.7 g/dL (ref 30.0–36.0)
MCV: 88.2 fL (ref 80.0–100.0)
Monocytes Absolute: 0.3 10*3/uL (ref 0.1–1.0)
Monocytes Relative: 10 %
Neutro Abs: 1.5 10*3/uL — ABNORMAL LOW (ref 1.7–7.7)
Neutrophils Relative %: 49 %
Platelet Count: 221 10*3/uL (ref 150–400)
RBC: 3.73 MIL/uL — ABNORMAL LOW (ref 3.87–5.11)
RDW: 13.1 % (ref 11.5–15.5)
WBC Count: 3.1 10*3/uL — ABNORMAL LOW (ref 4.0–10.5)
nRBC: 0 % (ref 0.0–0.2)

## 2021-05-31 LAB — CMP (CANCER CENTER ONLY)
ALT: 26 U/L (ref 0–44)
AST: 26 U/L (ref 15–41)
Albumin: 4 g/dL (ref 3.5–5.0)
Alkaline Phosphatase: 71 U/L (ref 38–126)
Anion gap: 10 (ref 5–15)
BUN: 15 mg/dL (ref 8–23)
CO2: 27 mmol/L (ref 22–32)
Calcium: 9.8 mg/dL (ref 8.9–10.3)
Chloride: 102 mmol/L (ref 98–111)
Creatinine: 1.16 mg/dL — ABNORMAL HIGH (ref 0.44–1.00)
GFR, Estimated: 50 mL/min — ABNORMAL LOW (ref >60)
Glucose, Bld: 115 mg/dL — ABNORMAL HIGH (ref 70–99)
Potassium: 3.6 mmol/L (ref 3.5–5.1)
Sodium: 139 mmol/L (ref 135–145)
Total Bilirubin: 0.4 mg/dL (ref 0.3–1.2)
Total Protein: 7.6 g/dL (ref 6.5–8.1)

## 2021-05-31 MED ORDER — SODIUM CHLORIDE 0.9 % IV SOLN: Freq: Once | INTRAVENOUS | Status: AC

## 2021-05-31 MED ORDER — HEPARIN SOD (PORK) LOCK FLUSH 100 UNIT/ML IV SOLN
500.0000 [IU] | Freq: Once | INTRAVENOUS | Status: AC | PRN
  Administered 2021-05-31: 500 [IU]

## 2021-05-31 MED ORDER — ACETAMINOPHEN 325 MG PO TABS
650.0000 mg | ORAL_TABLET | Freq: Once | ORAL | Status: AC
  Administered 2021-05-31: 650 mg via ORAL
  Filled 2021-05-31: qty 2

## 2021-05-31 MED ORDER — SODIUM CHLORIDE 0.9% FLUSH
10.0000 mL | Freq: Once | INTRAVENOUS | Status: AC
  Administered 2021-05-31: 10 mL

## 2021-05-31 MED ORDER — TRASTUZUMAB-ANNS CHEMO 150 MG IV SOLR
6.0000 mg/kg | Freq: Once | INTRAVENOUS | Status: AC
  Administered 2021-05-31: 420 mg via INTRAVENOUS
  Filled 2021-05-31: qty 20

## 2021-05-31 MED ORDER — DIPHENHYDRAMINE HCL 25 MG PO CAPS
25.0000 mg | ORAL_CAPSULE | Freq: Once | ORAL | Status: AC
  Administered 2021-05-31: 25 mg via ORAL
  Filled 2021-05-31: qty 1

## 2021-05-31 MED ORDER — SODIUM CHLORIDE 0.9% FLUSH
10.0000 mL | INTRAVENOUS | Status: DC | PRN
  Administered 2021-05-31: 10 mL

## 2021-05-31 NOTE — Patient Instructions (Signed)
[facility]COLOG[facility]  Discharge Instructions: Thank you for choosing [facility] to provide your oncology and hematology care.   If you have a lab appointment with the Cancer Center, please go directly to the Cancer Center and check in at the registration area.   Wear comfortable clothing and clothing appropriate for easy access to any Portacath or PICC line.   We strive to give you quality time with your provider. You may need to reschedule your appointment if you arrive late (15 or more minutes).  Arriving late affects you and other patients whose appointments are after yours.  Also, if you miss three or more appointments without notifying the office, you may be dismissed from the clinic at the provider's discretion.      For prescription refill requests, have your pharmacy contact our office and allow 72 hours for refills to be completed.    Today you received the following chemotherapy and/or immunotherapy agents trastuzumab      To help prevent nausea and vomiting after your treatment, we encourage you to take your nausea medication as directed.  BELOW ARE SYMPTOMS THAT SHOULD BE REPORTED IMMEDIATELY: *FEVER GREATER THAN 100.4 F (38 C) OR HIGHER *CHILLS OR SWEATING *NAUSEA AND VOMITING THAT IS NOT CONTROLLED WITH YOUR NAUSEA MEDICATION *UNUSUAL SHORTNESS OF BREATH *UNUSUAL BRUISING OR BLEEDING *URINARY PROBLEMS (pain or burning when urinating, or frequent urination) *BOWEL PROBLEMS (unusual diarrhea, constipation, pain near the anus) TENDERNESS IN MOUTH AND THROAT WITH OR WITHOUT PRESENCE OF ULCERS (sore throat, sores in mouth, or a toothache) UNUSUAL RASH, SWELLING OR PAIN  UNUSUAL VAGINAL DISCHARGE OR ITCHING   Items with * indicate a potential emergency and should be followed up as soon as possible or go to the Emergency Department if any problems should occur.  Please show the CHEMOTHERAPY ALERT CARD or IMMUNOTHERAPY ALERT CARD at check-in to  the Emergency Department and triage nurse.  Should you have questions after your visit or need to cancel or reschedule your appointment, please contact [facility]  Dept: [phone]  and follow the prompts.  Office hours are 8:00 a.m. to 4:30 p.m. Monday - Friday. Please note that voicemails left after 4:00 p.m. may not be returned until the following business day.  We are closed weekends and major holidays. You have access to a nurse at all times for urgent questions. Please call the main number to the clinic Dept: [phone] and follow the prompts.   For any non-urgent questions, you may also contact your provider using MyChart. We now offer e-Visits for anyone 18 and older to request care online for non-urgent symptoms. For details visit [email or web address].   Also download the MyChart app! Go to the app store, search "MyChart", open the app, select [facility], and log in with your MyChart username and password.  Due to Covid, a mask is required upon entering the hospital/clinic. If you do not have a mask, one will be given to you upon arrival. For doctor visits, patients may have 1 support person aged 18 or older with them. For treatment visits, patients cannot have anyone with them due to current Covid guidelines and our immunocompromised population.   

## 2021-06-01 ENCOUNTER — Other Ambulatory Visit: Payer: Self-pay

## 2021-06-01 ENCOUNTER — Ambulatory Visit (HOSPITAL_COMMUNITY): Payer: Medicare PPO | Attending: Cardiology

## 2021-06-01 DIAGNOSIS — Z01818 Encounter for other preprocedural examination: Secondary | ICD-10-CM | POA: Diagnosis not present

## 2021-06-01 DIAGNOSIS — R002 Palpitations: Secondary | ICD-10-CM | POA: Diagnosis not present

## 2021-06-01 DIAGNOSIS — Z170 Estrogen receptor positive status [ER+]: Secondary | ICD-10-CM

## 2021-06-01 DIAGNOSIS — Z0189 Encounter for other specified special examinations: Secondary | ICD-10-CM

## 2021-06-01 DIAGNOSIS — Z171 Estrogen receptor negative status [ER-]: Secondary | ICD-10-CM | POA: Insufficient documentation

## 2021-06-01 DIAGNOSIS — I10 Essential (primary) hypertension: Secondary | ICD-10-CM | POA: Diagnosis not present

## 2021-06-01 DIAGNOSIS — E785 Hyperlipidemia, unspecified: Secondary | ICD-10-CM | POA: Diagnosis not present

## 2021-06-01 DIAGNOSIS — C50012 Malignant neoplasm of nipple and areola, left female breast: Secondary | ICD-10-CM | POA: Insufficient documentation

## 2021-06-01 DIAGNOSIS — C50411 Malignant neoplasm of upper-outer quadrant of right female breast: Secondary | ICD-10-CM | POA: Insufficient documentation

## 2021-06-01 LAB — ECHOCARDIOGRAM COMPLETE
Area-P 1/2: 4.63 cm2
S' Lateral: 2.3 cm

## 2021-06-06 ENCOUNTER — Telehealth: Payer: Self-pay

## 2021-06-06 NOTE — Telephone Encounter (Signed)
April [name] from [facility] called and states pt's lymphedema pump was denied by [name] and will need a P2P. Reviewed this information with Wilber Bihari, NP. Returned call to [place] at [phone] and gave information to contact Wilber Bihari, NP to schedule.

## 2021-06-14 ENCOUNTER — Other Ambulatory Visit: Payer: Self-pay | Admitting: Adult Health

## 2021-06-14 ENCOUNTER — Ambulatory Visit: Payer: Medicare PPO

## 2021-06-14 ENCOUNTER — Other Ambulatory Visit: Payer: Medicare PPO

## 2021-06-14 NOTE — Progress Notes (Signed)
Received a message from insurance asking for [place] to place orders for scans to continue patient's Herceptin per the insurance company.  After review of the chart I noticed that patient began Herceptin in February 2022.  Standard of care is 1 year of Herceptin therapy.  Requested more information regarding necessity of imaging and what guidelines this company was following.  Awaiting response to call the patient and left a message on her voicemail to let her know we may have to do additional testing.  Wilber Bihari, NP 06/14/21 4:06 PM Medical Oncology and Hematology [facility] [place], [place] [address] Tel. [phone]    Fax. [phone]

## 2021-06-15 ENCOUNTER — Encounter: Payer: Self-pay | Admitting: Oncology

## 2021-06-21 ENCOUNTER — Other Ambulatory Visit: Payer: Self-pay

## 2021-06-21 ENCOUNTER — Inpatient Hospital Stay: Payer: Medicare PPO

## 2021-06-21 ENCOUNTER — Inpatient Hospital Stay: Payer: Medicare PPO | Attending: Oncology

## 2021-06-21 ENCOUNTER — Encounter: Payer: Self-pay | Admitting: *Deleted

## 2021-06-21 VITALS — BP 158/77 | HR 62 | Temp 98.7°F | Resp 16 | Wt 153.0 lb

## 2021-06-21 DIAGNOSIS — C50212 Malignant neoplasm of upper-inner quadrant of left female breast: Secondary | ICD-10-CM | POA: Insufficient documentation

## 2021-06-21 DIAGNOSIS — Z803 Family history of malignant neoplasm of breast: Secondary | ICD-10-CM | POA: Insufficient documentation

## 2021-06-21 DIAGNOSIS — Z171 Estrogen receptor negative status [ER-]: Secondary | ICD-10-CM | POA: Diagnosis not present

## 2021-06-21 DIAGNOSIS — Z8249 Family history of ischemic heart disease and other diseases of the circulatory system: Secondary | ICD-10-CM | POA: Diagnosis not present

## 2021-06-21 DIAGNOSIS — Z170 Estrogen receptor positive status [ER+]: Secondary | ICD-10-CM | POA: Diagnosis not present

## 2021-06-21 DIAGNOSIS — Z79899 Other long term (current) drug therapy: Secondary | ICD-10-CM | POA: Diagnosis not present

## 2021-06-21 DIAGNOSIS — C50411 Malignant neoplasm of upper-outer quadrant of right female breast: Secondary | ICD-10-CM | POA: Insufficient documentation

## 2021-06-21 DIAGNOSIS — Z8042 Family history of malignant neoplasm of prostate: Secondary | ICD-10-CM | POA: Insufficient documentation

## 2021-06-21 DIAGNOSIS — Z8041 Family history of malignant neoplasm of ovary: Secondary | ICD-10-CM | POA: Diagnosis not present

## 2021-06-21 DIAGNOSIS — Z5112 Encounter for antineoplastic immunotherapy: Secondary | ICD-10-CM | POA: Diagnosis not present

## 2021-06-21 DIAGNOSIS — C773 Secondary and unspecified malignant neoplasm of axilla and upper limb lymph nodes: Secondary | ICD-10-CM | POA: Diagnosis not present

## 2021-06-21 DIAGNOSIS — C50012 Malignant neoplasm of nipple and areola, left female breast: Secondary | ICD-10-CM

## 2021-06-21 DIAGNOSIS — Z95828 Presence of other vascular implants and grafts: Secondary | ICD-10-CM

## 2021-06-21 LAB — CMP (CANCER CENTER ONLY)
ALT: 23 U/L (ref 0–44)
AST: 22 U/L (ref 15–41)
Albumin: 4.2 g/dL (ref 3.5–5.0)
Alkaline Phosphatase: 65 U/L (ref 38–126)
Anion gap: 6 (ref 5–15)
BUN: 19 mg/dL (ref 8–23)
CO2: 29 mmol/L (ref 22–32)
Calcium: 10.2 mg/dL (ref 8.9–10.3)
Chloride: 103 mmol/L (ref 98–111)
Creatinine: 0.98 mg/dL (ref 0.44–1.00)
GFR, Estimated: >60 mL/min (ref >60)
Glucose, Bld: 111 mg/dL — ABNORMAL HIGH (ref 70–99)
Potassium: 3.9 mmol/L (ref 3.5–5.1)
Sodium: 138 mmol/L (ref 135–145)
Total Bilirubin: 0.3 mg/dL (ref 0.3–1.2)
Total Protein: 7.2 g/dL (ref 6.5–8.1)

## 2021-06-21 LAB — CBC WITH DIFFERENTIAL (CANCER CENTER ONLY)
Abs Immature Granulocytes: 0 10*3/uL (ref 0.00–0.07)
Basophils Absolute: 0 10*3/uL (ref 0.0–0.1)
Basophils Relative: 1 %
Eosinophils Absolute: 0.1 10*3/uL (ref 0.0–0.5)
Eosinophils Relative: 3 %
HCT: 32 % — ABNORMAL LOW (ref 36.0–46.0)
Hemoglobin: 10.5 g/dL — ABNORMAL LOW (ref 12.0–15.0)
Immature Granulocytes: 0 %
Lymphocytes Relative: 33 %
Lymphs Abs: 1 10*3/uL (ref 0.7–4.0)
MCH: 29.5 pg (ref 26.0–34.0)
MCHC: 32.8 g/dL (ref 30.0–36.0)
MCV: 89.9 fL (ref 80.0–100.0)
Monocytes Absolute: 0.3 10*3/uL (ref 0.1–1.0)
Monocytes Relative: 10 %
Neutro Abs: 1.6 10*3/uL — ABNORMAL LOW (ref 1.7–7.7)
Neutrophils Relative %: 53 %
Platelet Count: 209 10*3/uL (ref 150–400)
RBC: 3.56 MIL/uL — ABNORMAL LOW (ref 3.87–5.11)
RDW: 13.2 % (ref 11.5–15.5)
WBC Count: 3.1 10*3/uL — ABNORMAL LOW (ref 4.0–10.5)
nRBC: 0 % (ref 0.0–0.2)

## 2021-06-21 MED ORDER — HEPARIN SOD (PORK) LOCK FLUSH 100 UNIT/ML IV SOLN
500.0000 [IU] | Freq: Once | INTRAVENOUS | Status: AC | PRN
  Administered 2021-06-21: 500 [IU]

## 2021-06-21 MED ORDER — SODIUM CHLORIDE 0.9% FLUSH
10.0000 mL | INTRAVENOUS | Status: DC | PRN
  Administered 2021-06-21: 10 mL

## 2021-06-21 MED ORDER — TRASTUZUMAB-ANNS CHEMO 150 MG IV SOLR
6.0000 mg/kg | Freq: Once | INTRAVENOUS | Status: AC
  Administered 2021-06-21: 420 mg via INTRAVENOUS
  Filled 2021-06-21: qty 20

## 2021-06-21 MED ORDER — SODIUM CHLORIDE 0.9 % IV SOLN: Freq: Once | INTRAVENOUS | Status: AC

## 2021-06-21 MED ORDER — SODIUM CHLORIDE 0.9% FLUSH
10.0000 mL | Freq: Once | INTRAVENOUS | Status: AC
  Administered 2021-06-21: 10 mL

## 2021-06-21 MED ORDER — ACETAMINOPHEN 325 MG PO TABS
650.0000 mg | ORAL_TABLET | Freq: Once | ORAL | Status: AC
  Administered 2021-06-21: 650 mg via ORAL
  Filled 2021-06-21: qty 2

## 2021-06-21 MED ORDER — DIPHENHYDRAMINE HCL 25 MG PO CAPS
25.0000 mg | ORAL_CAPSULE | Freq: Once | ORAL | Status: AC
  Administered 2021-06-21: 25 mg via ORAL
  Filled 2021-06-21: qty 1

## 2021-06-21 NOTE — Progress Notes (Signed)
Labs reviewed today. Proceed as planned.   Treatment given per orders. Patient tolerated it well without problems. Vitals stable and discharged home from clinic ambulatory. Follow up as scheduled.

## 2021-06-21 NOTE — Patient Instructions (Addendum)
[facility] ONCOLOGY  Discharge Instructions: Thank you for choosing [facility] to provide your oncology and hematology care.   If you have a lab appointment with the [facility], please go directly to the [facility] and check in at the registration area.   Wear comfortable clothing and clothing appropriate for easy access to any Portacath or PICC line.   We strive to give you quality time with your provider. You may need to reschedule your appointment if you arrive late (15 or more minutes).  Arriving late affects you and other patients whose appointments are after yours.  Also, if you miss three or more appointments without notifying the office, you may be dismissed from the clinic at the providers discretion.      For prescription refill requests, have your pharmacy contact our office and allow 72 hours for refills to be completed.    Today you received the following,  [place]   To help prevent nausea and vomiting after your treatment, we encourage you to take your nausea medication as directed.  BELOW ARE SYMPTOMS THAT SHOULD BE REPORTED IMMEDIATELY: *FEVER GREATER THAN 100.4 F (38 C) OR HIGHER *CHILLS OR SWEATING *NAUSEA AND VOMITING THAT IS NOT CONTROLLED WITH YOUR NAUSEA MEDICATION *UNUSUAL SHORTNESS OF BREATH *UNUSUAL BRUISING OR BLEEDING *URINARY PROBLEMS (pain or burning when urinating, or frequent urination) *BOWEL PROBLEMS (unusual diarrhea, constipation, pain near the anus) TENDERNESS IN MOUTH AND THROAT WITH OR WITHOUT PRESENCE OF ULCERS (sore throat, sores in mouth, or a toothache) UNUSUAL RASH, SWELLING OR PAIN  UNUSUAL VAGINAL DISCHARGE OR ITCHING   Items with * indicate a potential emergency and should be followed up as soon as possible or go to the Emergency Department if any problems should occur.  Please show the CHEMOTHERAPY ALERT CARD or IMMUNOTHERAPY ALERT CARD at check-in to the Emergency Department and triage  nurse.  Should you have questions after your visit or need to cancel or reschedule your appointment, please contact [facility]  Dept: [phone]  and follow the prompts.  Office hours are 8:00 a.m. to 4:30 p.m. Monday - Friday. Please note that voicemails left after 4:00 p.m. may not be returned until the following business day.  We are closed weekends and major holidays. You have access to a nurse at all times for urgent questions. Please call the main number to the clinic Dept: [phone] and follow the prompts.   For any non-urgent questions, you may also contact your provider using MyChart. We now offer e-Visits for anyone 37 and older to request care online for non-urgent symptoms. For details visit [email or web address].   Also download the MyChart app! Go to the app store, search "MyChart", open the app, select [facility], and log in with your MyChart username and password.  Due to Covid, a mask is required upon entering the hospital/clinic. If you do not have a mask, one will be given to you upon arrival. For doctor visits, patients may have 1 support person aged 74 or older with them. For treatment visits, patients cannot have anyone with them due to current Covid guidelines and our immunocompromised population.

## 2021-06-28 ENCOUNTER — Encounter: Payer: Self-pay | Admitting: Hematology and Oncology

## 2021-06-28 DIAGNOSIS — Z853 Personal history of malignant neoplasm of breast: Secondary | ICD-10-CM | POA: Diagnosis not present

## 2021-06-30 DIAGNOSIS — H40013 Open angle with borderline findings, low risk, bilateral: Secondary | ICD-10-CM | POA: Diagnosis not present

## 2021-06-30 DIAGNOSIS — H16223 Keratoconjunctivitis sicca, not specified as Sjogren's, bilateral: Secondary | ICD-10-CM | POA: Diagnosis not present

## 2021-06-30 DIAGNOSIS — Z961 Presence of intraocular lens: Secondary | ICD-10-CM | POA: Diagnosis not present

## 2021-07-04 DIAGNOSIS — H16223 Keratoconjunctivitis sicca, not specified as Sjogren's, bilateral: Secondary | ICD-10-CM | POA: Diagnosis not present

## 2021-07-04 DIAGNOSIS — H0288B Meibomian gland dysfunction left eye, upper and lower eyelids: Secondary | ICD-10-CM | POA: Diagnosis not present

## 2021-07-04 DIAGNOSIS — H0288A Meibomian gland dysfunction right eye, upper and lower eyelids: Secondary | ICD-10-CM | POA: Diagnosis not present

## 2021-07-11 ENCOUNTER — Other Ambulatory Visit: Payer: Self-pay | Admitting: Hematology and Oncology

## 2021-07-12 ENCOUNTER — Inpatient Hospital Stay: Payer: Medicare PPO

## 2021-07-12 ENCOUNTER — Other Ambulatory Visit: Payer: Self-pay

## 2021-07-12 VITALS — BP 153/73 | HR 62 | Temp 98.6°F | Resp 18 | Wt 152.0 lb

## 2021-07-12 DIAGNOSIS — Z171 Estrogen receptor negative status [ER-]: Secondary | ICD-10-CM

## 2021-07-12 DIAGNOSIS — C773 Secondary and unspecified malignant neoplasm of axilla and upper limb lymph nodes: Secondary | ICD-10-CM | POA: Diagnosis not present

## 2021-07-12 DIAGNOSIS — Z8041 Family history of malignant neoplasm of ovary: Secondary | ICD-10-CM | POA: Diagnosis not present

## 2021-07-12 DIAGNOSIS — C50411 Malignant neoplasm of upper-outer quadrant of right female breast: Secondary | ICD-10-CM | POA: Diagnosis not present

## 2021-07-12 DIAGNOSIS — Z803 Family history of malignant neoplasm of breast: Secondary | ICD-10-CM | POA: Diagnosis not present

## 2021-07-12 DIAGNOSIS — Z5112 Encounter for antineoplastic immunotherapy: Secondary | ICD-10-CM | POA: Diagnosis not present

## 2021-07-12 DIAGNOSIS — Z79899 Other long term (current) drug therapy: Secondary | ICD-10-CM | POA: Diagnosis not present

## 2021-07-12 DIAGNOSIS — C50212 Malignant neoplasm of upper-inner quadrant of left female breast: Secondary | ICD-10-CM | POA: Diagnosis not present

## 2021-07-12 DIAGNOSIS — C50012 Malignant neoplasm of nipple and areola, left female breast: Secondary | ICD-10-CM

## 2021-07-12 DIAGNOSIS — Z95828 Presence of other vascular implants and grafts: Secondary | ICD-10-CM

## 2021-07-12 DIAGNOSIS — Z170 Estrogen receptor positive status [ER+]: Secondary | ICD-10-CM | POA: Diagnosis not present

## 2021-07-12 LAB — CMP (CANCER CENTER ONLY)
ALT: 18 U/L (ref 0–44)
AST: 20 U/L (ref 15–41)
Albumin: 4.4 g/dL (ref 3.5–5.0)
Alkaline Phosphatase: 66 U/L (ref 38–126)
Anion gap: 7 (ref 5–15)
BUN: 17 mg/dL (ref 8–23)
CO2: 29 mmol/L (ref 22–32)
Calcium: 9.8 mg/dL (ref 8.9–10.3)
Chloride: 103 mmol/L (ref 98–111)
Creatinine: 1.07 mg/dL — ABNORMAL HIGH (ref 0.44–1.00)
GFR, Estimated: 56 mL/min — ABNORMAL LOW (ref >60)
Glucose, Bld: 107 mg/dL — ABNORMAL HIGH (ref 70–99)
Potassium: 3.7 mmol/L (ref 3.5–5.1)
Sodium: 139 mmol/L (ref 135–145)
Total Bilirubin: 0.3 mg/dL (ref 0.3–1.2)
Total Protein: 7.5 g/dL (ref 6.5–8.1)

## 2021-07-12 LAB — CBC WITH DIFFERENTIAL (CANCER CENTER ONLY)
Abs Immature Granulocytes: 0 10*3/uL (ref 0.00–0.07)
Basophils Absolute: 0 10*3/uL (ref 0.0–0.1)
Basophils Relative: 1 %
Eosinophils Absolute: 0.1 10*3/uL (ref 0.0–0.5)
Eosinophils Relative: 4 %
HCT: 32.4 % — ABNORMAL LOW (ref 36.0–46.0)
Hemoglobin: 10.6 g/dL — ABNORMAL LOW (ref 12.0–15.0)
Immature Granulocytes: 0 %
Lymphocytes Relative: 34 %
Lymphs Abs: 1.1 10*3/uL (ref 0.7–4.0)
MCH: 29.4 pg (ref 26.0–34.0)
MCHC: 32.7 g/dL (ref 30.0–36.0)
MCV: 89.8 fL (ref 80.0–100.0)
Monocytes Absolute: 0.3 10*3/uL (ref 0.1–1.0)
Monocytes Relative: 10 %
Neutro Abs: 1.7 10*3/uL (ref 1.7–7.7)
Neutrophils Relative %: 51 %
Platelet Count: 215 10*3/uL (ref 150–400)
RBC: 3.61 MIL/uL — ABNORMAL LOW (ref 3.87–5.11)
RDW: 12.8 % (ref 11.5–15.5)
WBC Count: 3.4 10*3/uL — ABNORMAL LOW (ref 4.0–10.5)
nRBC: 0 % (ref 0.0–0.2)

## 2021-07-12 MED ORDER — ACETAMINOPHEN 325 MG PO TABS
650.0000 mg | ORAL_TABLET | Freq: Once | ORAL | Status: AC
  Administered 2021-07-12: 13:00:00 650 mg via ORAL
  Filled 2021-07-12: qty 2

## 2021-07-12 MED ORDER — TRASTUZUMAB-ANNS CHEMO 150 MG IV SOLR
6.0000 mg/kg | Freq: Once | INTRAVENOUS | Status: AC
  Administered 2021-07-12: 14:00:00 420 mg via INTRAVENOUS
  Filled 2021-07-12: qty 20

## 2021-07-12 MED ORDER — SODIUM CHLORIDE 0.9% FLUSH
10.0000 mL | INTRAVENOUS | Status: DC | PRN
  Administered 2021-07-12: 14:00:00 10 mL

## 2021-07-12 MED ORDER — HEPARIN SOD (PORK) LOCK FLUSH 100 UNIT/ML IV SOLN
500.0000 [IU] | Freq: Once | INTRAVENOUS | Status: AC | PRN
  Administered 2021-07-12: 14:00:00 500 [IU]

## 2021-07-12 MED ORDER — DIPHENHYDRAMINE HCL 25 MG PO CAPS
25.0000 mg | ORAL_CAPSULE | Freq: Once | ORAL | Status: AC
  Administered 2021-07-12: 13:00:00 25 mg via ORAL
  Filled 2021-07-12: qty 1

## 2021-07-12 MED ORDER — SODIUM CHLORIDE 0.9 % IV SOLN: Freq: Once | INTRAVENOUS | Status: AC

## 2021-07-12 MED ORDER — SODIUM CHLORIDE 0.9% FLUSH
10.0000 mL | Freq: Once | INTRAVENOUS | Status: AC
  Administered 2021-07-12: 13:00:00 10 mL

## 2021-07-12 NOTE — Patient Instructions (Addendum)
[facility]COL[facility]  Discharge Instructions: Thank you for choosing [facility] to provide your oncology and hematology care.   If you have a lab appointment with the Cancer Center, please go directly to the Cancer Center and check in at the registration area.   Wear comfortable clothing and clothing appropriate for easy access to any Portacath or PICC line.   We strive to give you quality time with your provider. You may need to reschedule your appointment if you arrive late (15 or more minutes).  Arriving late affects you and other patients whose appointments are after yours.  Also, if you miss three or more appointments without notifying the office, you may be dismissed from the clinic at the provider's discretion.      For prescription refill requests, have your pharmacy contact our office and allow 72 hours for refills to be completed.    Today you received the following chemotherapy and/or immunotherapy agents trastuzumab      To help prevent nausea and vomiting after your treatment, we encourage you to take your nausea medication as directed.  BELOW ARE SYMPTOMS THAT SHOULD BE REPORTED IMMEDIATELY: *FEVER GREATER THAN 100.4 F (38 C) OR HIGHER *CHILLS OR SWEATING *NAUSEA AND VOMITING THAT IS NOT CONTROLLED WITH YOUR NAUSEA MEDICATION *UNUSUAL SHORTNESS OF BREATH *UNUSUAL BRUISING OR BLEEDING *URINARY PROBLEMS (pain or burning when urinating, or frequent urination) *BOWEL PROBLEMS (unusual diarrhea, constipation, pain near the anus) TENDERNESS IN MOUTH AND THROAT WITH OR WITHOUT PRESENCE OF ULCERS (sore throat, sores in mouth, or a toothache) UNUSUAL RASH, SWELLING OR PAIN  UNUSUAL VAGINAL DISCHARGE OR ITCHING   Items with * indicate a potential emergency and should be followed up as soon as possible or go to the Emergency Department if any problems should occur.  Please show the CHEMOTHERAPY ALERT CARD or IMMUNOTHERAPY ALERT CARD at check-in to  the Emergency Department and triage nurse.  Should you have questions after your visit or need to cancel or reschedule your appointment, please contact [facility]  Dept: [phone]  and follow the prompts.  Office hours are 8:00 a.m. to 4:30 p.m. Monday - Friday. Please note that voicemails left after 4:00 p.m. may not be returned until the following business day.  We are closed weekends and major holidays. You have access to a nurse at all times for urgent questions. Please call the main number to the clinic Dept: [phone] and follow the prompts.   For any non-urgent questions, you may also contact your provider using MyChart. We now offer e-Visits for anyone 18 and older to request care online for non-urgent symptoms. For details visit [email or web address].   Also download the MyChart app! Go to the app store, search "MyChart", open the app, select [facility], and log in with your MyChart username and password.  Due to Covid, a mask is required upon entering the hospital/clinic. If you do not have a mask, one will be given to you upon arrival. For doctor visits, patients may have 1 support person aged 18 or older with them. For treatment visits, patients cannot have anyone with them due to current Covid guidelines and our immunocompromised population.   

## 2021-07-20 ENCOUNTER — Other Ambulatory Visit: Payer: Self-pay | Admitting: Oncology

## 2021-08-01 NOTE — Progress Notes (Signed)
° °Patient Care Team: °Griffin, Elaine, MD as PCP - General (Family Medicine) °Wakefield, Matthew, MD as Consulting Physician (General Surgery) °Squire, Sarah, MD as Attending Physician (Radiation Oncology) °Duda, Marcus V, MD as Consulting Physician (Orthopedic Surgery) °Regal, Norman S, DPM as Consulting Physician (Podiatry) °Kozlow, Eric J, MD as Consulting Physician (Allergy and Immunology) °Stuart, Dawn C, RN as Oncology Nurse Navigator °Martini, Keisha N, RN as Oncology Nurse Navigator °Bensimhon, Daniel R, MD as Consulting Physician (Cardiology) ° °DIAGNOSIS:  °  ICD-10-CM   °1. Malignant neoplasm of upper-outer quadrant of right breast in female, estrogen receptor negative (HCC)  C50.411   ° Z17.1   °  °2. Malignant neoplasm of nipple of left breast in female, estrogen receptor positive (HCC)  C50.012   ° Z17.0   °  ° ° °SUMMARY OF ONCOLOGIC HISTORY: °Oncology History  °Malignant neoplasm of upper-outer quadrant of right breast in female, estrogen receptor negative (HCC)  °06/20/2017 Initial Diagnosis  °  April [name] status post right breast upper outer quadrant biopsy 06/20/2017 for a clinical T1b N0, stage IA invasive ductal carcinoma, grade 3, estrogen and progesterone receptor negative, but HER-2 amplified, with an MIB-1 of 30% °            °  ° °  °07/05/2017 Genetic Testing  ° The patient had genetic testing due to a personal history of breast cancer and family history of breast and ovarian cancer.  The Common Hereditary Cancer Panel was ordered. The Common Hereditary Cancer Panel offered by [facility] includes sequencing and/or deletion duplication testing of the following 47 genes: APC, ATM, AXIN2, BARD1, BMPR1A, BRCA1, BRCA2, BRIP1, CDH1, CDKN2A (p14ARF), CDKN2A (p16INK4a), CKD4, CHEK2, CTNNA1, DICER1, EPCAM (Deletion/duplication testing only), GREM1 (promoter region deletion/duplication testing only), KIT, MEN1, MLH1, MSH2, MSH3, MSH6, MUTYH, NBN, NF1, NHTL1, PALB2, PDGFRA, PMS2, POLD1, POLE,  PTEN, RAD50, RAD51C, RAD51D, SDHB, SDHC, SDHD, SMAD4, SMARCA4. STK11, TP53, TSC1, TSC2, and VHL.  The following genes were evaluated for sequence changes only: SDHA and HOXB13 c.251G>A variant only. ° °Results: No pathogenic variants identified.  A Variant of uncertain significance in the gene MSH2 was identified c.1331G>T (p.Arg444Leu).  The date of this test report is 07/05/2017. ° °UPDATE:MSH2 c.1331G>T (p.Arg444Leu) has been reclassified to Benign.  The amended report date is December 27, 2020. °  °07/17/2017 Surgery  ° status post right lumpectomy and sentinel lymph node sampling for a pT1c pN0, stage IA invasive ductal carcinoma, grade 2, with negative margins.  A total of 5 lymph nodes were removed ° °  °08/07/2017 - 07/23/2018 Adjuvant Chemotherapy  ° adjuvant chemotherapy consisting of paclitaxel weekly x12 together with trastuzumab every 21 days °  °10/17/2017 - 11/14/2017 Radiation Therapy  ° adjuvant radiation: 40.05 Gy directed to the Right Breast in 15 fractions, followed by a boost of 10 Gy given in 5 fractions  °  °07/27/2020 - 12/30/2020 Chemotherapy  ° adjuvant chemotherapy and anti-HER2 immunotherapy started 07/27/2020, consisting of CMF chemotherapy to be repeated every 21 days x 8, completed 12/28/2020 °  °  °07/27/2020 -  Chemotherapy  ° Patient is on Treatment Plan : BREAST Trastuzumab q21d  °   °02/2021 -  Anti-estrogen oral therapy  ° Anastrozole daily X 5 years °  °Malignant neoplasm of left breast, estrogen receptor positive (HCC)  °05/19/2017 Initial Diagnosis  ° Malignant neoplasm of left breast, estrogen receptor negative (HCC) °  °06/15/2020 Relapse/Recurrence  ° left breast upper inner quadrant biopsy 06/15/2020 for a clinical T1 N0 invasive ductal   carcinoma, grade 3, weakly estrogen receptor positive, progesterone receptor negative, HER2 amplified, with an MIB-1 of 45% °  °07/13/2020 Cancer Staging  ° Staging form: Breast, AJCC 8th Edition °- Pathologic stage from 07/13/2020: Stage IA (pT1c, pN0, cM0,  G3, ER+, PR-, HER2+) - Signed by Causey, Lindsey Cornetto, NP on 07/28/2020 °Stage prefix: Initial diagnosis °Histologic grading system: 3 grade system ° °  °07/13/2020 Surgery  ° left lumpectomy and sentinel lymph node sampling 07/13/2020 for a pT1c pN0, stage IB invasive ductal carcinoma, grade 3, with negative margins, 5 axilla lymph nodes were removed, all benign, epeat prognostic panel again weakly estrogen receptor positive, progesterone receptor negative, now HER-2 not amplified °  °09/09/2020 - 10/20/2020 Radiation Therapy  ° Adjuvant radiation °  ° ° °CHIEF COMPLIANT: Follow-up of left breast cancer, establishing oncology care and last dose of Herceptin ° °INTERVAL HISTORY: April [name] is a 70 y.o. with above-mentioned history of left breast cancer and prior history of right breast cancer. Mammogram on 06/28/2021 showed no evidence of malignancy bilaterally. She presents to the clinic today for follow-up.  She is tolerating the Herceptin extremely well.  Today is her last treatment.  She is also been tolerating anastrozole fairly well.  She does have hot flashes but they are not unbearable. ° °ALLERGIES:  is allergic to tape, betamethasone dipropionate, caffeine, cetirizine hcl, fosamax [alendronate], lisinopril, loratadine, nsaids, other, statins, and sulfa antibiotics. ° °MEDICATIONS:  °Current Outpatient Medications  °Medication Sig Dispense Refill  ° anastrozole (ARIMIDEX) 1 MG tablet Take 1 tablet (1 mg total) by mouth daily. 90 tablet 4  ° aspirin 81 MG chewable tablet Chew 81 mg by mouth daily.    ° Calcium-Vitamin D-Vitamin K (CALCIUM + D) 500-1000-40 MG-UNT-MCG CHEW     ° co-enzyme Q-10 30 MG capsule Take 30 mg by mouth daily.    ° hydrochlorothiazide (MICROZIDE) 12.5 MG capsule TAKE 1 CAPSULE BY MOUTH EVERY DAY 90 capsule 3  ° Omega-3 Fatty Acids (FISH OIL) 1000 MG CAPS Take 1 capsule by mouth daily.    ° Omega-3 Fatty Acids (FISH OIL) 1000 MG CAPS 1 capsule    ° Pyridoxine HCl (VITAMIN B6) 250  MG TABS 1 tablet    ° Red Yeast Rice Extract (RED YEAST RICE PO) Take by mouth.    ° vitamin B-12 (CYANOCOBALAMIN) 1000 MCG tablet Take 1,000 mcg by mouth daily.    ° vitamin C (ASCORBIC ACID) 250 MG tablet Take 250 mg by mouth daily.    ° °No current facility-administered medications for this visit.  ° ° °PHYSICAL EXAMINATION: °ECOG PERFORMANCE STATUS: 1 - Symptomatic but completely ambulatory ° °Vitals:  ° 08/02/21 1420  °BP: (!) 163/54  °Pulse: 73  °Resp: 17  °Temp: 97.8 °F (36.6 °C)  °SpO2: 99%  ° °Filed Weights  ° 08/02/21 1420  °Weight: 154 lb 1.6 oz (69.9 kg)  ° ° °  ° °LABORATORY DATA:  °I have reviewed the data as listed °CMP Latest Ref Rng & Units 07/12/2021 06/21/2021 05/31/2021  °Glucose 70 - 99 mg/dL 107(H) 111(H) 115(H)  °BUN 8 - 23 mg/dL 17 19 15  °Creatinine 0.44 - 1.00 mg/dL 1.07(H) 0.98 1.16(H)  °Sodium 135 - 145 mmol/L 139 138 139  °Potassium 3.5 - 5.1 mmol/L 3.7 3.9 3.6  °Chloride 98 - 111 mmol/L 103 103 102  °CO2 22 - 32 mmol/L 29 29 27  °Calcium 8.9 - 10.3 mg/dL 9.8 10.2 9.8  °Total Protein 6.5 - 8.1 g/dL 7.5 7.2 7.6  °  Total Bilirubin 0.3 - 1.2 mg/dL 0.3 0.3 0.4  Alkaline Phos 38 - 126 U/L 66 65 71  AST 15 - 41 U/L _0 ALT 0 - 44 U/L _1 Lab Results  Component Value Date   WBC 3.5 (L) 08/02/2021   HGB 10.4 (L) 08/02/2021   HCT 31.0 (L) 08/02/2021   MCV 89.9 08/02/2021   PLT 214 08/02/2021   NEUTROABS 2.0 08/02/2021    ASSESSMENT & PLAN:  Malignant neoplasm of left breast, estrogen receptor positive ([place]) 06/20/2017: Right breast cancer T1BN0 stage Ia grade 3, ER/PR negative, HER2 +0/5 lymph nodes negative (genetics negative), adjuvant chemo with Taxol Herceptin with Herceptin maintenance for 1 year, adjuvant radiation 06/15/2020: Left breast cancer T1N0 grade 3 IDC, weak ER positive, PR negative, biopsy had HER2 amplified with a Ki-67 of 45%, status post left lumpectomy: T1CN0 stage Ib grade 3, 0/5 lymph nodes (HER2 negative on final path) Adjuvant chemo with CMF  Herceptin followed by Herceptin maintenance completed February 2023, XRT  Treatment plan: Adjuvant antiestrogen therapy with  anastrozole 1 mg daily  started 02/2021 Anastrozole toxicities: Tolerating it fairly well with exception of mild to moderate hot flashes.   Return to clinic in 3 months for survivorship care plan visit  No orders of the defined types were placed in this encounter.  The patient has a good understanding of the overall plan. she agrees with it. she will call with any problems that may develop before the next visit here.  Total time spent: 30 mins including face to face time and time spent for planning, charting and coordination of care  Rulon Eisenmenger, MD, MPH 08/02/2021  I, Thana Ates, am acting as scribe for Dr. Nicholas Lose.  I have reviewed the above documentation for accuracy and completeness, and I agree with the above.

## 2021-08-02 ENCOUNTER — Inpatient Hospital Stay: Payer: Medicare PPO | Admitting: Hematology and Oncology

## 2021-08-02 ENCOUNTER — Inpatient Hospital Stay: Payer: Medicare PPO | Attending: Oncology

## 2021-08-02 ENCOUNTER — Other Ambulatory Visit: Payer: Self-pay

## 2021-08-02 ENCOUNTER — Other Ambulatory Visit: Payer: Self-pay | Admitting: Hematology and Oncology

## 2021-08-02 ENCOUNTER — Inpatient Hospital Stay: Payer: Medicare PPO

## 2021-08-02 ENCOUNTER — Encounter: Payer: Self-pay | Admitting: *Deleted

## 2021-08-02 VITALS — BP 163/54 | HR 73 | Temp 97.8°F | Resp 17 | Ht 63.5 in | Wt 154.1 lb

## 2021-08-02 DIAGNOSIS — Z803 Family history of malignant neoplasm of breast: Secondary | ICD-10-CM | POA: Insufficient documentation

## 2021-08-02 DIAGNOSIS — Z170 Estrogen receptor positive status [ER+]: Secondary | ICD-10-CM

## 2021-08-02 DIAGNOSIS — C50012 Malignant neoplasm of nipple and areola, left female breast: Secondary | ICD-10-CM

## 2021-08-02 DIAGNOSIS — Z882 Allergy status to sulfonamides status: Secondary | ICD-10-CM | POA: Insufficient documentation

## 2021-08-02 DIAGNOSIS — C50212 Malignant neoplasm of upper-inner quadrant of left female breast: Secondary | ICD-10-CM | POA: Diagnosis present

## 2021-08-02 DIAGNOSIS — Z886 Allergy status to analgesic agent status: Secondary | ICD-10-CM | POA: Insufficient documentation

## 2021-08-02 DIAGNOSIS — C50411 Malignant neoplasm of upper-outer quadrant of right female breast: Secondary | ICD-10-CM

## 2021-08-02 DIAGNOSIS — Z95828 Presence of other vascular implants and grafts: Secondary | ICD-10-CM

## 2021-08-02 DIAGNOSIS — Z8041 Family history of malignant neoplasm of ovary: Secondary | ICD-10-CM | POA: Insufficient documentation

## 2021-08-02 DIAGNOSIS — R232 Flushing: Secondary | ICD-10-CM | POA: Diagnosis not present

## 2021-08-02 DIAGNOSIS — Z171 Estrogen receptor negative status [ER-]: Secondary | ICD-10-CM

## 2021-08-02 DIAGNOSIS — C50412 Malignant neoplasm of upper-outer quadrant of left female breast: Secondary | ICD-10-CM | POA: Diagnosis not present

## 2021-08-02 DIAGNOSIS — Z888 Allergy status to other drugs, medicaments and biological substances status: Secondary | ICD-10-CM | POA: Diagnosis not present

## 2021-08-02 DIAGNOSIS — Z5112 Encounter for antineoplastic immunotherapy: Secondary | ICD-10-CM | POA: Diagnosis not present

## 2021-08-02 DIAGNOSIS — Z79899 Other long term (current) drug therapy: Secondary | ICD-10-CM | POA: Diagnosis not present

## 2021-08-02 LAB — CMP (CANCER CENTER ONLY)
ALT: 26 U/L (ref 0–44)
AST: 28 U/L (ref 15–41)
Albumin: 4 g/dL (ref 3.5–5.0)
Alkaline Phosphatase: 54 U/L (ref 38–126)
Anion gap: 7 (ref 5–15)
BUN: 14 mg/dL (ref 8–23)
CO2: 28 mmol/L (ref 22–32)
Calcium: 9.5 mg/dL (ref 8.9–10.3)
Chloride: 103 mmol/L (ref 98–111)
Creatinine: 1.39 mg/dL — ABNORMAL HIGH (ref 0.44–1.00)
GFR, Estimated: 41 mL/min — ABNORMAL LOW (ref >60)
Glucose, Bld: 95 mg/dL (ref 70–99)
Potassium: 3.9 mmol/L (ref 3.5–5.1)
Sodium: 138 mmol/L (ref 135–145)
Total Bilirubin: 0.3 mg/dL (ref 0.3–1.2)
Total Protein: 7.4 g/dL (ref 6.5–8.1)

## 2021-08-02 LAB — CBC WITH DIFFERENTIAL (CANCER CENTER ONLY)
Abs Immature Granulocytes: 0.02 10*3/uL (ref 0.00–0.07)
Basophils Absolute: 0 10*3/uL (ref 0.0–0.1)
Basophils Relative: 1 %
Eosinophils Absolute: 0.1 10*3/uL (ref 0.0–0.5)
Eosinophils Relative: 2 %
HCT: 31 % — ABNORMAL LOW (ref 36.0–46.0)
Hemoglobin: 10.4 g/dL — ABNORMAL LOW (ref 12.0–15.0)
Immature Granulocytes: 1 %
Lymphocytes Relative: 28 %
Lymphs Abs: 1 10*3/uL (ref 0.7–4.0)
MCH: 30.1 pg (ref 26.0–34.0)
MCHC: 33.5 g/dL (ref 30.0–36.0)
MCV: 89.9 fL (ref 80.0–100.0)
Monocytes Absolute: 0.4 10*3/uL (ref 0.1–1.0)
Monocytes Relative: 10 %
Neutro Abs: 2 10*3/uL (ref 1.7–7.7)
Neutrophils Relative %: 58 %
Platelet Count: 214 10*3/uL (ref 150–400)
RBC: 3.45 MIL/uL — ABNORMAL LOW (ref 3.87–5.11)
RDW: 13.2 % (ref 11.5–15.5)
WBC Count: 3.5 10*3/uL — ABNORMAL LOW (ref 4.0–10.5)
nRBC: 0 % (ref 0.0–0.2)

## 2021-08-02 MED ORDER — DIPHENHYDRAMINE HCL 25 MG PO CAPS
25.0000 mg | ORAL_CAPSULE | Freq: Once | ORAL | Status: AC
  Administered 2021-08-02: 25 mg via ORAL
  Filled 2021-08-02: qty 1

## 2021-08-02 MED ORDER — HEPARIN SOD (PORK) LOCK FLUSH 100 UNIT/ML IV SOLN
500.0000 [IU] | Freq: Once | INTRAVENOUS | Status: AC | PRN
  Administered 2021-08-02: 500 [IU]

## 2021-08-02 MED ORDER — SODIUM CHLORIDE 0.9% FLUSH
10.0000 mL | Freq: Once | INTRAVENOUS | Status: AC
  Administered 2021-08-02: 10 mL

## 2021-08-02 MED ORDER — SODIUM CHLORIDE 0.9 % IV SOLN: Freq: Once | INTRAVENOUS | Status: AC

## 2021-08-02 MED ORDER — TRASTUZUMAB-ANNS CHEMO 150 MG IV SOLR
6.0000 mg/kg | Freq: Once | INTRAVENOUS | Status: AC
  Administered 2021-08-02: 420 mg via INTRAVENOUS
  Filled 2021-08-02: qty 20

## 2021-08-02 MED ORDER — ACETAMINOPHEN 325 MG PO TABS
650.0000 mg | ORAL_TABLET | Freq: Once | ORAL | Status: AC
  Administered 2021-08-02: 650 mg via ORAL
  Filled 2021-08-02: qty 2

## 2021-08-02 MED ORDER — SODIUM CHLORIDE 0.9% FLUSH
10.0000 mL | INTRAVENOUS | Status: DC | PRN
  Administered 2021-08-02: 10 mL

## 2021-08-02 NOTE — Assessment & Plan Note (Signed)
06/20/2017: Right breast cancer T1BN0 stage Ia grade 3, ER/PR negative, HER2 +0/5 lymph nodes negative (genetics negative), adjuvant chemo with Taxol Herceptin with Herceptin maintenance for 1 year, adjuvant radiation 06/15/2020: Left breast cancer T1N0 grade 3 IDC, weak ER positive, PR negative, biopsy had HER2 amplified with a Ki-67 of 45%, status post left lumpectomy: T1CN0 stage Ib grade 3, 0/5 lymph nodes (HER2 negative on final path) Adjuvant chemo with CMF Herceptin followed by Herceptin maintenance completed February 2023, XRT  Treatment plan: Adjuvant antiestrogen therapy with letrozole 2.5 mg daily x5 years (final pathology was weak estrogen receptor positive)  Letrozole counseling: We discussed the risks and benefits of anti-estrogen therapy with aromatase inhibitors. These include but not limited to insomnia, hot flashes, mood changes, vaginal dryness, bone density loss, and weight gain. We strongly believe that the benefits far outweigh the risks. Patient understands these risks and consented to starting treatment. Planned treatment duration is 5 years.  Return to clinic in 3 months for survivorship care plan visit

## 2021-08-08 ENCOUNTER — Telehealth: Payer: Self-pay

## 2021-08-08 NOTE — Telephone Encounter (Signed)
Pt called to ask about most recent labs, concerning creatinine and GFR. Advised pt to increase water intake as she states she mostly drinks grape juice, coffee and OJ. Pt is taking HCTZ. Advised pt she may need a f/u with Dr Missy Sabins regarding HCTZ and HTN. Pt verbalized thanks and understanding.

## 2021-08-24 ENCOUNTER — Encounter (HOSPITAL_BASED_OUTPATIENT_CLINIC_OR_DEPARTMENT_OTHER): Payer: Self-pay | Admitting: General Surgery

## 2021-08-24 ENCOUNTER — Other Ambulatory Visit: Payer: Self-pay

## 2021-08-29 ENCOUNTER — Other Ambulatory Visit: Payer: Self-pay

## 2021-08-29 ENCOUNTER — Encounter (HOSPITAL_BASED_OUTPATIENT_CLINIC_OR_DEPARTMENT_OTHER)
Admission: RE | Admit: 2021-08-29 | Discharge: 2021-08-29 | Disposition: A | Discharge status: Home / Self Care | Payer: Medicare PPO | Source: Ambulatory Visit | Attending: General Surgery | Admitting: General Surgery

## 2021-08-29 DIAGNOSIS — I10 Essential (primary) hypertension: Secondary | ICD-10-CM | POA: Insufficient documentation

## 2021-08-29 DIAGNOSIS — Z01818 Encounter for other preprocedural examination: Secondary | ICD-10-CM | POA: Diagnosis not present

## 2021-08-29 LAB — BASIC METABOLIC PANEL
Anion gap: 8 (ref 5–15)
BUN: 11 mg/dL (ref 8–23)
CO2: 30 mmol/L (ref 22–32)
Calcium: 9.9 mg/dL (ref 8.9–10.3)
Chloride: 104 mmol/L (ref 98–111)
Creatinine, Ser: 1.12 mg/dL — ABNORMAL HIGH (ref 0.44–1.00)
GFR, Estimated: 53 mL/min — ABNORMAL LOW (ref >60)
Glucose, Bld: 104 mg/dL — ABNORMAL HIGH (ref 70–99)
Potassium: 4.4 mmol/L (ref 3.5–5.1)
Sodium: 142 mmol/L (ref 135–145)

## 2021-09-02 NOTE — Progress Notes (Signed)
Returned patient's call regarding DOS medications, Instructed patient to hold all vitamins/supplements/ASA 5 days prior, and do not take HCTZ morning of surgery.  ?

## 2021-09-05 ENCOUNTER — Encounter (HOSPITAL_BASED_OUTPATIENT_CLINIC_OR_DEPARTMENT_OTHER)
Admission: RE | Disposition: A | Discharge status: Home / Self Care | Payer: Self-pay | Source: Ambulatory Visit | Attending: General Surgery

## 2021-09-05 ENCOUNTER — Ambulatory Visit (HOSPITAL_BASED_OUTPATIENT_CLINIC_OR_DEPARTMENT_OTHER): Payer: Medicare PPO | Admitting: Anesthesiology

## 2021-09-05 ENCOUNTER — Other Ambulatory Visit: Payer: Self-pay

## 2021-09-05 ENCOUNTER — Other Ambulatory Visit: Payer: Self-pay | Admitting: General Surgery

## 2021-09-05 ENCOUNTER — Encounter (HOSPITAL_BASED_OUTPATIENT_CLINIC_OR_DEPARTMENT_OTHER): Payer: Self-pay | Admitting: General Surgery

## 2021-09-05 ENCOUNTER — Ambulatory Visit (HOSPITAL_BASED_OUTPATIENT_CLINIC_OR_DEPARTMENT_OTHER)
Admission: RE | Admit: 2021-09-05 | Discharge: 2021-09-05 | Disposition: A | Discharge status: Home / Self Care | Payer: Medicare PPO | Source: Ambulatory Visit | Attending: General Surgery | Admitting: General Surgery

## 2021-09-05 DIAGNOSIS — I10 Essential (primary) hypertension: Secondary | ICD-10-CM | POA: Insufficient documentation

## 2021-09-05 DIAGNOSIS — Z452 Encounter for adjustment and management of vascular access device: Secondary | ICD-10-CM | POA: Diagnosis not present

## 2021-09-05 DIAGNOSIS — Z170 Estrogen receptor positive status [ER+]: Secondary | ICD-10-CM | POA: Diagnosis not present

## 2021-09-05 DIAGNOSIS — C50919 Malignant neoplasm of unspecified site of unspecified female breast: Secondary | ICD-10-CM

## 2021-09-05 DIAGNOSIS — Z8673 Personal history of transient ischemic attack (TIA), and cerebral infarction without residual deficits: Secondary | ICD-10-CM | POA: Diagnosis not present

## 2021-09-05 DIAGNOSIS — C50212 Malignant neoplasm of upper-inner quadrant of left female breast: Secondary | ICD-10-CM | POA: Diagnosis not present

## 2021-09-05 DIAGNOSIS — Z171 Estrogen receptor negative status [ER-]: Secondary | ICD-10-CM | POA: Diagnosis not present

## 2021-09-05 DIAGNOSIS — Z9221 Personal history of antineoplastic chemotherapy: Secondary | ICD-10-CM | POA: Insufficient documentation

## 2021-09-05 DIAGNOSIS — Z79811 Long term (current) use of aromatase inhibitors: Secondary | ICD-10-CM | POA: Insufficient documentation

## 2021-09-05 DIAGNOSIS — C50411 Malignant neoplasm of upper-outer quadrant of right female breast: Secondary | ICD-10-CM | POA: Diagnosis not present

## 2021-09-05 HISTORY — DX: Hyperlipidemia, unspecified: E78.5

## 2021-09-05 HISTORY — PX: PORT-A-CATH REMOVAL: SHX5289

## 2021-09-05 SURGERY — REMOVAL PORT-A-CATH
Anesthesia: General | Site: Chest | Laterality: Right

## 2021-09-05 MED ORDER — CEFAZOLIN SODIUM-DEXTROSE 2-4 GM/100ML-% IV SOLN
INTRAVENOUS | Status: AC
  Filled 2021-09-05: qty 100

## 2021-09-05 MED ORDER — ONDANSETRON HCL 4 MG/2ML IJ SOLN
INTRAMUSCULAR | Status: AC
  Filled 2021-09-05: qty 2

## 2021-09-05 MED ORDER — LIDOCAINE-EPINEPHRINE (PF) 1 %-1:200000 IJ SOLN
INTRAMUSCULAR | Status: DC | PRN
  Administered 2021-09-05: 4.5 mL

## 2021-09-05 MED ORDER — CEFAZOLIN SODIUM-DEXTROSE 2-4 GM/100ML-% IV SOLN: 2.0000 g | INTRAVENOUS | Status: DC

## 2021-09-05 MED ORDER — AMISULPRIDE (ANTIEMETIC) 5 MG/2ML IV SOLN: 10.0000 mg | Freq: Once | INTRAVENOUS | Status: DC | PRN

## 2021-09-05 MED ORDER — ONDANSETRON HCL 4 MG/2ML IJ SOLN
INTRAMUSCULAR | Status: DC | PRN
  Administered 2021-09-05: 4 mg via INTRAVENOUS

## 2021-09-05 MED ORDER — ACETAMINOPHEN 500 MG PO TABS
1000.0000 mg | ORAL_TABLET | ORAL | Status: AC
  Administered 2021-09-05: 1000 mg via ORAL

## 2021-09-05 MED ORDER — LACTATED RINGERS IV SOLN: INTRAVENOUS | Status: DC

## 2021-09-05 MED ORDER — PROPOFOL 500 MG/50ML IV EMUL
INTRAVENOUS | Status: DC | PRN
Start: 2021-09-05 — End: 2021-09-05
  Administered 2021-09-05: 75 ug/kg/min via INTRAVENOUS

## 2021-09-05 MED ORDER — LIDOCAINE-EPINEPHRINE (PF) 1 %-1:200000 IJ SOLN
INTRAMUSCULAR | Status: AC
  Filled 2021-09-05: qty 30

## 2021-09-05 MED ORDER — FENTANYL CITRATE (PF) 100 MCG/2ML IJ SOLN
INTRAMUSCULAR | Status: DC | PRN
  Administered 2021-09-05: 50 ug via INTRAVENOUS

## 2021-09-05 MED ORDER — PROPOFOL 10 MG/ML IV BOLUS
INTRAVENOUS | Status: DC | PRN
  Administered 2021-09-05: 20 mg via INTRAVENOUS

## 2021-09-05 MED ORDER — PROPOFOL 10 MG/ML IV BOLUS
INTRAVENOUS | Status: AC
  Filled 2021-09-05: qty 20

## 2021-09-05 MED ORDER — CHLORHEXIDINE GLUCONATE CLOTH 2 % EX PADS: 6.0000 | MEDICATED_PAD | Freq: Once | CUTANEOUS | Status: DC

## 2021-09-05 MED ORDER — FENTANYL CITRATE (PF) 100 MCG/2ML IJ SOLN: 25.0000 ug | INTRAMUSCULAR | Status: DC | PRN

## 2021-09-05 MED ORDER — ONDANSETRON HCL 4 MG/2ML IJ SOLN: 4.0000 mg | Freq: Once | INTRAMUSCULAR | Status: DC | PRN

## 2021-09-05 MED ORDER — ACETAMINOPHEN 500 MG PO TABS
ORAL_TABLET | ORAL | Status: AC
  Filled 2021-09-05: qty 2

## 2021-09-05 MED ORDER — OXYCODONE HCL 5 MG/5ML PO SOLN: 5.0000 mg | Freq: Once | ORAL | Status: DC | PRN

## 2021-09-05 MED ORDER — FENTANYL CITRATE (PF) 100 MCG/2ML IJ SOLN
INTRAMUSCULAR | Status: AC
  Filled 2021-09-05: qty 2

## 2021-09-05 MED ORDER — ENSURE PRE-SURGERY PO LIQD: 296.0000 mL | Freq: Once | ORAL | Status: DC

## 2021-09-05 MED ORDER — BUPIVACAINE HCL (PF) 0.25 % IJ SOLN
INTRAMUSCULAR | Status: DC | PRN
  Administered 2021-09-05: 4.5 mL

## 2021-09-05 MED ORDER — OXYCODONE HCL 5 MG PO TABS: 5.0000 mg | ORAL_TABLET | Freq: Once | ORAL | Status: DC | PRN

## 2021-09-05 SURGICAL SUPPLY — 36 items
ADH SKN CLS APL DERMABOND .7 (GAUZE/BANDAGES/DRESSINGS) ×1
APL PRP STRL LF DISP 70% ISPRP (MISCELLANEOUS) ×1
BLADE SURG 15 STRL LF DISP TIS (BLADE) ×1 IMPLANT
BLADE SURG 15 STRL SS (BLADE) ×2
CHLORAPREP W/TINT 26 (MISCELLANEOUS) ×2 IMPLANT
COVER BACK TABLE 60X90IN (DRAPES) ×2 IMPLANT
COVER MAYO STAND STRL (DRAPES) ×2 IMPLANT
DERMABOND ADVANCED (GAUZE/BANDAGES/DRESSINGS) ×1
DERMABOND ADVANCED .7 DNX12 (GAUZE/BANDAGES/DRESSINGS) ×1 IMPLANT
DRAPE LAPAROTOMY 100X72 PEDS (DRAPES) ×2 IMPLANT
DRAPE UTILITY XL STRL (DRAPES) ×2 IMPLANT
ELECT COATED BLADE 2.86 ST (ELECTRODE) ×2 IMPLANT
ELECT REM PT RETURN 9FT ADLT (ELECTROSURGICAL) ×2
ELECTRODE REM PT RTRN 9FT ADLT (ELECTROSURGICAL) ×1 IMPLANT
GLOVE SURG ENC MOIS LTX SZ7 (GLOVE) ×2 IMPLANT
GLOVE SURG POLYISO LF SZ6.5 (GLOVE) ×2 IMPLANT
GLOVE SURG UNDER POLY LF SZ6.5 (GLOVE) ×1 IMPLANT
GLOVE SURG UNDER POLY LF SZ7 (GLOVE) ×2 IMPLANT
GLOVE SURG UNDER POLY LF SZ7.5 (GLOVE) ×2 IMPLANT
GOWN STRL REUS W/ TWL LRG LVL3 (GOWN DISPOSABLE) ×2 IMPLANT
GOWN STRL REUS W/ TWL XL LVL3 (GOWN DISPOSABLE) IMPLANT
GOWN STRL REUS W/TWL LRG LVL3 (GOWN DISPOSABLE) ×4
GOWN STRL REUS W/TWL XL LVL3 (GOWN DISPOSABLE) ×2
NDL HYPO 25X1 1.5 SAFETY (NEEDLE) ×1 IMPLANT
NEEDLE HYPO 25X1 1.5 SAFETY (NEEDLE) ×2 IMPLANT
PACK BASIN DAY SURGERY FS (CUSTOM PROCEDURE TRAY) ×2 IMPLANT
PENCIL SMOKE EVACUATOR (MISCELLANEOUS) ×2 IMPLANT
SLEEVE SCD COMPRESS KNEE MED (STOCKING) ×1 IMPLANT
SPIKE FLUID TRANSFER (MISCELLANEOUS) ×2 IMPLANT
SPONGE T-LAP 4X18 ~~LOC~~+RFID (SPONGE) ×2 IMPLANT
STRIP CLOSURE SKIN 1/2X4 (GAUZE/BANDAGES/DRESSINGS) ×2 IMPLANT
SUT MNCRL AB 4-0 PS2 18 (SUTURE) ×2 IMPLANT
SUT VIC AB 3-0 SH 27 (SUTURE) ×2
SUT VIC AB 3-0 SH 27X BRD (SUTURE) ×1 IMPLANT
SYR CONTROL 10ML LL (SYRINGE) ×2 IMPLANT
TOWEL GREEN STERILE FF (TOWEL DISPOSABLE) ×2 IMPLANT

## 2021-09-05 NOTE — Anesthesia Procedure Notes (Signed)
Procedure Name: [place] ?Date/Time: 09/05/2021 11:29 AM ?Performed by: Glory Buff, CRNA ?Pre-anesthesia Checklist: Patient identified, Emergency Drugs available, Suction available and Patient being monitored ?Patient Re-evaluated:Patient Re-evaluated prior to induction ?Oxygen Delivery Method: Simple face mask ? ? ? ? ?

## 2021-09-05 NOTE — Transfer of Care (Signed)
Immediate Anesthesia Transfer of Care Note ? ?Patient: April [name] ? ?Procedure(s) Performed: REMOVAL PORT-A-CATH (Right: Chest) ? ?Patient Location: PACU ? ?Anesthesia Type:MAC ? ?Level of Consciousness: awake, alert  and oriented ? ?Airway & Oxygen Therapy: Patient Spontanous Breathing and Patient connected to face mask oxygen ? ?Post-op Assessment: Report given to RN and Post -op Vital signs reviewed and stable ? ?Post vital signs: Reviewed and stable ? ?Last Vitals:  ?Vitals Value Taken Time  ?BP    ?Temp    ?Pulse    ?Resp    ?SpO2    ? ? ?Last Pain:  ?Vitals:  ? 09/05/21 0933  ?TempSrc: Oral  ?PainSc: 0-No pain  ?   ? ?Patients Stated Pain Goal: 3 (09/05/21 0933) ? ?Complications: No notable events documented. ?

## 2021-09-05 NOTE — Discharge Instructions (Addendum)
No tylenol until 3 pm ? ? ?[facility] ?Office Phone Number [phone] ?POST OP INSTRUCTIONS ?Take 400 mg of ibuprofen every 8 hours or 650 mg tylenol every 6 hours for next 72 hours then as needed. Use ice several times daily also. ?Always review your discharge instruction sheet given to you by the facility where your surgery was performed.  ?Post Anesthesia Home Care Instructions ? ?Activity: ?Get plenty of rest for the remainder of the day. A responsible individual must stay with you for 24 hours following the procedure.  ?For the next 24 hours, DO NOT: ?-Drive a car ?-Paediatric nurse ?-Drink alcoholic beverages ?-Take any medication unless instructed by your physician ?-Make any legal decisions or sign important papers. ? ?Meals: ?Start with liquid foods such as gelatin or soup. Progress to regular foods as tolerated. Avoid greasy, spicy, heavy foods. If nausea and/or vomiting occur, drink only clear liquids until the nausea and/or vomiting subsides. Call your physician if vomiting continues. ? ?Special Instructions/Symptoms: ?Your throat may feel dry or sore from the anesthesia or the breathing tube placed in your throat during surgery. If this causes discomfort, gargle with warm salt water. The discomfort should disappear within 24 hours. ? ?If you had a scopolamine patch placed behind your ear for the management of post- operative nausea and/or vomiting: ? ?1. The medication in the patch is effective for 72 hours, after which it should be removed.  Wrap patch in a tissue and discard in the trash. Wash hands thoroughly with soap and water. ?2. You may remove the patch earlier than 72 hours if you experience unpleasant side effects which may include dry mouth, dizziness or visual disturbances. ?3. Avoid touching the patch. Wash your hands with soap and water after contact with the patch. ?    ? ?Take your usually prescribed medications unless otherwise directed ?You should eat very light the  first 24 hours after surgery, such as soup, crackers, pudding, etc.  Resume your normal diet the day after surgery. ?Most patients will experience some swelling and bruising.  Ice packs will help.  Swelling and bruising can take several days to resolve.  ?It is common to experience some constipation if taking pain medication after surgery.  Increasing fluid intake and taking a stool softener will usually help or prevent this problem from occurring.  A mild laxative (Milk of Magnesia or Miralax) should be taken according to package directions if there are no bowel movements after 48 hours. ?I used skin glue on the incision, you may shower in 24 hours.  The glue will flake off over the next 2-3 weeks.  Any sutures or staples will be removed at the office during your follow-up visit. ?ACTIVITIES:  You may resume regular daily activities (gradually increasing) beginning the next day.   ?You may drive when you no longer are taking prescription pain medication, you can comfortably wear a seatbelt, and you can safely maneuver your car and apply brakes. ?RETURN TO WORK:  ___________________________________________________________________________________ ?WHEN TO CALL DR WAKEFIELD: ?Fever over 101.0 ?Nausea and/or vomiting. ?Extreme swelling or bruising. ?Continued bleeding from incision. ?Increased pain, redness, or drainage from the incision. ? ?The clinic staff is available to answer your questions during regular business hours.  Please don?t hesitate to call and ask to speak to one of the nurses for clinical concerns.  If you have a medical emergency, go to the nearest emergency room or call 911.  A surgeon from [facility] is always on call at  the hospital. ? ?For further questions, please visit [email or web address] ? ?

## 2021-09-05 NOTE — Op Note (Signed)
Preoperative diagnosis: breast cancer, no longer needs venous access ?Postoperative diagnosis: Same as above ?Procedure: Right internal jugular port removal ?Surgeon: Dr. Serita Grammes ?Anesthesia: Local mac ?Estimated blood loss: Minimal ?Specimens: None ?Drains: None ?Complications: None ?Special count was correct completion ?Disposition recovery stable edition ? ?Indications: 1 yof s/p treatment for breast cancer no longer needs venous access, desires port removal.  ?  ?Procedure: After informed consent was obtained she was taken to the operating room.  She was given antibiotics.  SCDs were in place.  She was placed under MAC. he was prepped and draped in a standard sterile surgical fashion.  Surgical timeout was then performed. ? ?I infiltrated marcaine/lidocaine in her old incision on the chest wall. I then reentered the incision. I removed the port, line and suture material in their entirety.  I then obtained hemostasis.  I then closed with 3-0 Vicryl and 4-0 Monocryl.  Glue was placed.  She tolerated this well and was transferred to recovery. ?

## 2021-09-05 NOTE — H&P (Signed)
?71 yo  s/p right lump/sn and port placement in 2019.  she had a  1.6 cm grade II IDC with negative margins.  5 sn negative.  is er/pr negative and her2 positive with ki of 30. genetics was negative.  she then underwent adjuvant chemotherapy.  she completed trastumuzab.  she completed radiotherapy as well in May 2019. she then got regular mammogram with right side noted to have postop changes and the left side has a cluster of calcs (I dont have mm and I dont have a size).  axilla was negative. she underwent core biopsy that shows idc with hg dcis and LVI that is er pos at 50, pr negative, her 2 positive and Ki is 45%.I returned her to OR for left lumpectomy/sn biopsy/port. This is t1cn0 tumor with weak er pos, pr neg, her 2 amplified on core. She has done cmf and has now completed her 2 therapy.She has completed radiotherapy as well and on letrozole. ? ?Review of Systems: ?A complete review of systems was obtained from the patient. I have reviewed this information and discussed as appropriate with the patient. See HPI as well for other ROS. ? ?Review of Systems  ?Skin: Positive for itching and rash.  ?All other systems reviewed and are negative. ? ? ?Medical History: ?Past Medical History:  ?Diagnosis Date  ? History of cancer  ? Hyperlipidemia  ? Hypertension  ? ?Patient Active Problem List  ?Diagnosis  ? Malignant neoplasm of left breast, estrogen receptor positive (CMS-HCC)  ? Malignant neoplasm of upper-outer quadrant of right breast in female, estrogen receptor negative (CMS-HCC)  ? Personal history of transient ischemic attack (TIA), and cerebral infarction without residual deficits  ? ?Past Surgical History:  ?Procedure Laterality Date  ? CESAREAN SECTION  ? MASTECTOMY PARTIAL / LUMPECTOMY Bilateral  ?2  ? ? ?Allergies  ?Allergen Reactions  ? Betamethasone Dipropionate Other (See Comments)  ? Cetirizine Hcl Other (See Comments)  ? Diphenhydramine Hcl Swelling  ?Patient reports "I have taken it since the red  ant bites and had no swelling/hives with the Benadryl.  ? Lisinopril Other (See Comments)  ? Loratadine Other (See Comments) and Swelling  ? Nsaids (Non-Steroidal Anti-Inflammatory Drug) Other (See Comments)  ?Stomach issue  ? Adhesive Rash  ? Sulfa (Sulfonamide Antibiotics) Rash  ? ?Current Outpatient Medications on File Prior to Visit  ?Medication Sig Dispense Refill  ? aspirin 81 MG EC tablet Take 81 mg by mouth once daily  ? RED YEAST RICE EXTRACT, BULK, MISC Take by mouth  ? ? ?Family History  ?Problem Relation Age of Onset  ? Skin cancer Mother  ? Hyperlipidemia (Elevated cholesterol) Mother  ? Breast cancer Mother  ? High blood pressure (Hypertension) Father  ? Breast cancer Sister  ? ? ?Social History  ? ?Tobacco Use  ?Smoking Status Never Smoker  ?Smokeless Tobacco Never Used  ?Tobacco Comment  ?3 times per year  ? ? ?Social History  ? ?Socioeconomic History  ? Marital status: Married  ?Tobacco Use  ? Smoking status: Never Smoker  ? Smokeless tobacco: Never Used  ? Tobacco comment: 3 times per year  ?Substance and Sexual Activity  ? Drug use: Never  ? ?Objective:  ? ?Body mass index is 26.26 kg/m?. ? ?Physical Exam ?Constitutional:  ?Appearance: Normal appearance.  ?Chest:  ?Breasts:  ?Right: No mass or nipple discharge.  ?Left: No mass or nipple discharge.  ?Comments: Bilateral radiotherapy changes ?Right sided port in place ?Lymphadenopathy:  ?Upper Body:  ?Right upper  body: No supraclavicular or axillary adenopathy.  ?Left upper body: No supraclavicular or axillary adenopathy.  ?Neurological:  ?Mental Status: She is alert.  ? ?Assessment and Plan:  ? ?Malignant neoplasm of upper-inner quadrant of left breast in female, estrogen receptor positive (CMS-HCC) ? ?Port removal today ?

## 2021-09-05 NOTE — Anesthesia Preprocedure Evaluation (Signed)
Anesthesia Evaluation  ?Patient identified by MRN, date of birth, ID band ?Patient awake ? ? ? ?Reviewed: ?Allergy & Precautions, NPO status , Patient's Chart, lab work & pertinent test results ? ?History of Anesthesia Complications ?Negative for: history of anesthetic complications ? ?Airway ?Mallampati: III ? ?TM Distance: >3 FB ?Neck ROM: Full ? ? ? Dental ?  ?Pulmonary ?neg pulmonary ROS,  ?  ?Pulmonary exam normal ? ? ? ? ? ? ? Cardiovascular ?hypertension, Normal cardiovascular exam ? ? ?  ?Neuro/Psych ?negative neurological ROS ?   ? GI/Hepatic ?negative GI ROS, Neg liver ROS,   ?Endo/Other  ?negative endocrine ROS ? Renal/GU ?negative Renal ROS  ?negative genitourinary ?  ?Musculoskeletal ?negative musculoskeletal ROS ?(+)  ? Abdominal ?  ?Peds ? Hematology ?negative hematology ROS ?(+)   ?Anesthesia Other Findings ? ?Breast cancer ? Reproductive/Obstetrics ? ?  ? ? ? ? ? ? ? ? ? ? ? ? ? ?  ?  ? ? ? ? ? ? ?Anesthesia Physical ?Anesthesia Plan ? ?ASA: 2 ? ?Anesthesia Plan: MAC  ? ?Post-op Pain Management: Tylenol PO (pre-op)* and Toradol IV (intra-op)*  ? ?Induction: Intravenous ? ?PONV Risk Score and Plan: 2 and Propofol infusion, TIVA and Treatment may vary due to age or medical condition ? ?Airway Management Planned: Natural Airway, Nasal Cannula and Simple Face Mask ? ?Additional Equipment: None ? ?Intra-op Plan:  ? ?Post-operative Plan:  ? ?Informed Consent: I have reviewed the patients History and Physical, chart, labs and discussed the procedure including the risks, benefits and alternatives for the proposed anesthesia with the patient or authorized representative who has indicated his/her understanding and acceptance.  ? ? ? ? ? ?Plan Discussed with:  ? ?Anesthesia Plan Comments:   ? ? ? ? ? ? ?Anesthesia Quick Evaluation ? ?

## 2021-09-05 NOTE — Anesthesia Postprocedure Evaluation (Signed)
Anesthesia Post Note ? ?Patient: April [name] ? ?Procedure(s) Performed: REMOVAL PORT-A-CATH (Right: Chest) ? ?  ? ?Patient location during evaluation: PACU ?Level of consciousness: awake and alert ?Pain management: pain level controlled ?Vital Signs Assessment: post-procedure vital signs reviewed and stable ?Respiratory status: spontaneous breathing, nonlabored ventilation and respiratory function stable ?Cardiovascular status: blood pressure returned to baseline and stable ?Postop Assessment: no apparent nausea or vomiting ?Anesthetic complications: no ? ? ?No notable events documented. ? ?Last Vitals:  ?Vitals:  ? 09/05/21 1215 09/05/21 1226  ?BP: (!) 147/71 (!) 160/67  ?Pulse: (!) 59 (!) 58  ?Resp: 15 18  ?Temp:  36.4 ?C  ?SpO2: 99% 100%  ?  ?Last Pain:  ?Vitals:  ? 09/05/21 1226  ?TempSrc: Oral  ?PainSc: 0-No pain  ? ? ?  ?  ?  ?  ?  ?  ? ?Lidia Collum ? ? ? ? ?

## 2021-09-07 ENCOUNTER — Encounter (HOSPITAL_BASED_OUTPATIENT_CLINIC_OR_DEPARTMENT_OTHER): Payer: Self-pay | Admitting: General Surgery

## 2021-10-11 ENCOUNTER — Encounter (HOSPITAL_COMMUNITY): Payer: Self-pay

## 2021-10-26 DIAGNOSIS — E559 Vitamin D deficiency, unspecified: Secondary | ICD-10-CM | POA: Diagnosis not present

## 2021-10-26 DIAGNOSIS — Z1331 Encounter for screening for depression: Secondary | ICD-10-CM | POA: Diagnosis not present

## 2021-10-26 DIAGNOSIS — I10 Essential (primary) hypertension: Secondary | ICD-10-CM | POA: Diagnosis not present

## 2021-10-26 DIAGNOSIS — M810 Age-related osteoporosis without current pathological fracture: Secondary | ICD-10-CM | POA: Diagnosis not present

## 2021-10-26 DIAGNOSIS — J309 Allergic rhinitis, unspecified: Secondary | ICD-10-CM | POA: Diagnosis not present

## 2021-10-26 DIAGNOSIS — Z0000 Encounter for general adult medical examination without abnormal findings: Secondary | ICD-10-CM | POA: Diagnosis not present

## 2021-10-26 DIAGNOSIS — C50912 Malignant neoplasm of unspecified site of left female breast: Secondary | ICD-10-CM | POA: Diagnosis not present

## 2021-10-26 DIAGNOSIS — E7800 Pure hypercholesterolemia, unspecified: Secondary | ICD-10-CM | POA: Diagnosis not present

## 2021-10-26 DIAGNOSIS — Z8673 Personal history of transient ischemic attack (TIA), and cerebral infarction without residual deficits: Secondary | ICD-10-CM | POA: Diagnosis not present

## 2021-11-15 DIAGNOSIS — H16223 Keratoconjunctivitis sicca, not specified as Sjogren's, bilateral: Secondary | ICD-10-CM | POA: Diagnosis not present

## 2021-11-15 DIAGNOSIS — H0288B Meibomian gland dysfunction left eye, upper and lower eyelids: Secondary | ICD-10-CM | POA: Diagnosis not present

## 2021-11-15 DIAGNOSIS — H0288A Meibomian gland dysfunction right eye, upper and lower eyelids: Secondary | ICD-10-CM | POA: Diagnosis not present

## 2021-11-18 ENCOUNTER — Other Ambulatory Visit: Payer: Self-pay

## 2021-11-18 DIAGNOSIS — Z171 Estrogen receptor negative status [ER-]: Secondary | ICD-10-CM

## 2021-11-21 ENCOUNTER — Telehealth: Payer: Self-pay | Admitting: Adult Health

## 2021-11-21 NOTE — Telephone Encounter (Signed)
Scheduled appointment per 6/2 scheduling message. Called the patient twice. She answered but was unable to hear her thereafter. Will follow up later today.

## 2021-11-25 NOTE — Therapy (Signed)
OUTPATIENT PHYSICAL THERAPY ONCOLOGY EVALUATION  Patient Name: April [name] MRN: [id number] DOB:05-02-1951, 71 y.o., female Today's Date: 11/25/2021    Past Medical History:  Diagnosis Date   Breast cancer ([place]) 05/2017   right breast   Breast cancer ([place]) 06/2020   left breast IDC   Eczema    Family history of breast cancer    Family history of ovarian cancer    History of radiation therapy 10/17/17- 11/14/17   40.05 directed to the right breast in 15 fractions, followed by a boost of 10 gy given in 5 fractions.    Hyperlipidemia    Hypertension    toxemia during pregnancy, no meds now   Past Surgical History:  Procedure Laterality Date   ABDOMINAL HYSTERECTOMY     BREAST LUMPECTOMY WITH RADIOACTIVE SEED AND SENTINEL LYMPH NODE BIOPSY Right 07/17/2017   Procedure: BREAST LUMPECTOMY WITH RADIOACTIVE SEED AND SENTINEL LYMPH NODE BIOPSY;  Surgeon: Rolm Bookbinder, MD;  Location: [facility];  Service: General;  Laterality: Right;   BREAST LUMPECTOMY WITH RADIOACTIVE SEED AND SENTINEL LYMPH NODE BIOPSY Left 07/13/2020   Procedure: LEFT BREAST LUMPECTOMY WITH RADIOACTIVE SEED AND LEFT AXILLARY SENTINEL LYMPH NODE BIOPSY;  Surgeon: Rolm Bookbinder, MD;  Location: [place];  Service: General;  Laterality: Left;   CESAREAN SECTION     x4   DILATION AND CURETTAGE OF UTERUS     KNEE SURGERY Right    PORT-A-CATH REMOVAL Right 09/04/2018   Procedure: REMOVAL PORT-A-CATH;  Surgeon: Rolm Bookbinder, MD;  Location: [place];  Service: General;  Laterality: Right;   PORT-A-CATH REMOVAL Right 09/05/2021   Procedure: REMOVAL PORT-A-CATH;  Surgeon: Rolm Bookbinder, MD;  Location: [place];  Service: General;  Laterality: Right;   PORTACATH PLACEMENT N/A 07/17/2017   Procedure: [place] WITH [place];  Surgeon: Rolm Bookbinder, MD;  Location: [place];  Service: General;  Laterality: N/A;    PORTACATH PLACEMENT Right 07/13/2020   Procedure: INSERTION PORT-A-CATH WITH ULTRASOUND GUIDANCE;  Surgeon: Rolm Bookbinder, MD;  Location: [facility];  Service: General;  Laterality: Right;   Patient Active Problem List   Diagnosis Date Noted   Age-related osteoporosis without current pathological fracture 09/08/2020   Allergic rhinitis 09/08/2020   Palpitations 09/08/2020   Personal history of malignant neoplasm of breast 09/08/2020   Personal history of transient ischemic attack (TIA), and cerebral infarction without residual deficits 09/08/2020   Pure hypercholesterolemia 09/08/2020   Vitamin D deficiency 09/08/2020   Neuropathy due to chemotherapeutic drug ([place]) 07/23/2018   Port-A-Cath in place 08/28/2017   TIA (transient ischemic attack) 08/09/2017   Hypertension    Genetic testing 07/06/2017   Family history of ovarian cancer    Family history of breast cancer    Malignant neoplasm of upper-outer quadrant of right breast in female, estrogen receptor negative ([place]) 06/26/2017   Malignant neoplasm of left breast, estrogen receptor positive ([place]) 05/19/2017    PCP: ***  REFERRING PROVIDER: Wilber Bihari, NP  REFERRING DIAG: ***  THERAPY DIAG:  No diagnosis found.  ONSET DATE: ***  Rationale for Evaluation and Treatment Rehabilitation  SUBJECTIVE  SUBJECTIVE STATEMENT:  PERTINENT HISTORY:  Pt with prior history of right breast cancer  with surgery on 07/17/17 for right lumpectomy and SLNB with 5 LN removed.  Had chemo and radiation. Most recent surgery1/25/22 for left breast lumpectomy with 0/5 positive nodes. She will have chemo, radiation and infusions for immunotherapy.   PAIN:  Are you having pain? {yes/no:20286} NPRS scale: ***/10 Pain location: *** Pain  orientation: {Pain Orientation:25161}  PAIN TYPE: {type:313116} Pain description: {PAIN DESCRIPTION:21022940}  Aggravating factors: *** Relieving factors: ***  PRECAUTIONS: {Therapy precautions:24002}  WEIGHT BEARING RESTRICTIONS No  FALLS:  Has patient fallen in last 6 months? {fallsyesno:27318}  LIVING ENVIRONMENT: Lives with: lives with their spouse Lives in: House/apartment Stairs: {yes/no:20286}; {Stairs:24000} Has following equipment at home: {Assistive devices:23999}  OCCUPATION: ***  LEISURE: ***  HAND DOMINANCE : {RIGHT/LEFT:21944}   PRIOR LEVEL OF FUNCTION: {PLOF:24004}  PATIENT GOALS ***   OBJECTIVE  COGNITION:  Overall cognitive status: Within functional limits for tasks assessed   PALPATION: ***  OBSERVATIONS / OTHER ASSESSMENTS: ***  SENSATION:  Light touch: {intact/deficits:24005}    POSTURE: forward head, round shoulders  UPPER EXTREMITY AROM/PROM:  A/PROM RIGHT   eval   Shoulder extension   Shoulder flexion   Shoulder abduction   Shoulder internal rotation   Shoulder external rotation     (Blank rows = not tested)  A/PROM LEFT   eval  Shoulder extension   Shoulder flexion   Shoulder abduction   Shoulder internal rotation   Shoulder external rotation     (Blank rows = not tested)   CERVICAL AROM: All within normal limits:      UPPER EXTREMITY STRENGTH: WNL   LYMPHEDEMA ASSESSMENTS:   SURGERY TYPE/DATE: Left lumpectomy 07/13/2020, Right lumpectomy 07/17/2017  NUMBER OF LYMPH NODES REMOVED: 5 right, 5 left  CHEMOTHERAPY: yes  RADIATION:yes  HORMONE TREATMENT: ***  INFECTIONS: ***  LYMPHEDEMA ASSESSMENTS:   LANDMARK RIGHT  eval  10 cm proximal to olecranon process   Olecranon process   10 cm proximal to ulnar styloid process   Just proximal to ulnar styloid process   Across hand at thumb web space   At base of 2nd digit   (Blank rows = not tested)  LANDMARK LEFT  eval  10 cm proximal to olecranon  process   Olecranon process   10 cm proximal to ulnar styloid process   Just proximal to ulnar styloid process   Across hand at thumb web space   At base of 2nd digit   (Blank rows = not tested)   L-DEX LYMPHEDEMA SCREENING:  The patient was assessed using the L-Dex machine today to produce a lymphedema index baseline score. The patient will be reassessed on a regular basis (typically every 3 months) to obtain new L-Dex scores. If the score is > 6.5 points away from his/her baseline score indicating onset of subclinical lymphedema, it will be recommended to wear a compression garment for 4 weeks, 12 hours per day and then be reassessed. If the score continues to be > 6.5 points from baseline at reassessment, we will initiate lymphedema treatment. Assessing in this manner has a 95% rate of preventing clinically significant lymphedema.     QUICK DASH SURVEY: ***   TODAY'S TREATMENT  ***  PATIENT EDUCATION:  Education details: *** Person educated: {Person educated:25204} Education method: {Education Method:25205} Education comprehension: {Education Comprehension:25206}   HOME EXERCISE PROGRAM: ***  ASSESSMENT:  CLINICAL IMPRESSION: Patient is a 71 y.o. female who was seen today for physical  therapy evaluation and treatment for ***.    OBJECTIVE IMPAIRMENTS {opptimpairments:25111}.   ACTIVITY LIMITATIONS {activitylimitations:27494}  PARTICIPATION LIMITATIONS: {participationrestrictions:25113}  PERSONAL FACTORS {Personal factors:25162} are also affecting patient's functional outcome.   REHAB POTENTIAL: Good  CLINICAL DECISION MAKING: Stable/uncomplicated  EVALUATION COMPLEXITY: Low  GOALS: Goals reviewed with patient? Yes  SHORT TERM GOALS: Target date: {follow up:25551}    *** Baseline: Goal status: {GOALSTATUS:25110}  2.  *** Baseline:  Goal status: {GOALSTATUS:25110}  3.  *** Baseline:  Goal status: {GOALSTATUS:25110}  4.  *** Baseline:  Goal  status: {GOALSTATUS:25110}  5.  *** Baseline:  Goal status: {GOALSTATUS:25110}  6.  *** Baseline:  Goal status: {GOALSTATUS:25110}  LONG TERM GOALS: Target date: {follow up:25551}    *** Baseline:  Goal status: {GOALSTATUS:25110}  2.  *** Baseline:  Goal status: {GOALSTATUS:25110}  3.  *** Baseline:  Goal status: {GOALSTATUS:25110}  4.  *** Baseline:  Goal status: {GOALSTATUS:25110}  5.  *** Baseline:  Goal status: {GOALSTATUS:25110}  6.  *** Baseline:  Goal status: {GOALSTATUS:25110}  PLAN: PT FREQUENCY: {rehab frequency:25116}  PT DURATION: {rehab duration:25117}  PLANNED INTERVENTIONS: {rehab planned interventions:25118::"Therapeutic exercises","Therapeutic activity","Neuromuscular re-education","Balance training","Gait training","Patient/Family education","Joint mobilization"}  PLAN FOR NEXT SESSION: ***   Claris Pong, PT 11/25/2021, 5:04 PM

## 2021-11-28 ENCOUNTER — Ambulatory Visit: Payer: Medicare PPO | Attending: Adult Health

## 2021-11-28 DIAGNOSIS — Z483 Aftercare following surgery for neoplasm: Secondary | ICD-10-CM | POA: Insufficient documentation

## 2021-11-28 DIAGNOSIS — R293 Abnormal posture: Secondary | ICD-10-CM | POA: Diagnosis not present

## 2021-11-28 DIAGNOSIS — R609 Edema, unspecified: Secondary | ICD-10-CM | POA: Diagnosis not present

## 2021-11-28 DIAGNOSIS — C50412 Malignant neoplasm of upper-outer quadrant of left female breast: Secondary | ICD-10-CM | POA: Insufficient documentation

## 2021-11-28 DIAGNOSIS — Z171 Estrogen receptor negative status [ER-]: Secondary | ICD-10-CM | POA: Diagnosis not present

## 2021-11-28 DIAGNOSIS — C50411 Malignant neoplasm of upper-outer quadrant of right female breast: Secondary | ICD-10-CM | POA: Insufficient documentation

## 2021-12-05 ENCOUNTER — Ambulatory Visit: Payer: Medicare PPO

## 2021-12-05 DIAGNOSIS — C50411 Malignant neoplasm of upper-outer quadrant of right female breast: Secondary | ICD-10-CM

## 2021-12-05 DIAGNOSIS — R609 Edema, unspecified: Secondary | ICD-10-CM

## 2021-12-05 DIAGNOSIS — Z171 Estrogen receptor negative status [ER-]: Secondary | ICD-10-CM | POA: Diagnosis not present

## 2021-12-05 DIAGNOSIS — Z483 Aftercare following surgery for neoplasm: Secondary | ICD-10-CM | POA: Diagnosis not present

## 2021-12-05 DIAGNOSIS — C50412 Malignant neoplasm of upper-outer quadrant of left female breast: Secondary | ICD-10-CM | POA: Diagnosis not present

## 2021-12-05 DIAGNOSIS — R293 Abnormal posture: Secondary | ICD-10-CM | POA: Diagnosis not present

## 2021-12-05 NOTE — Therapy (Signed)
OUTPATIENT PHYSICAL THERAPY ONCOLOGY TREATMENT  Patient Name: April [name] MRN: [id number] DOB:[birth year], 71 y.o., female Today's Date: 12/05/2021   PT End of Session - 12/05/21 1255     Visit Number 2    Number of Visits 12    Date for PT Re-Evaluation 01/09/22    Authorization Type [employer]    Authorization Time Period 6/12-7/24/2023    Authorization - Visit Number 2    Authorization - Number of Visits 12    Progress Note Due on Visit 12    PT Start Time 1257    PT Stop Time 7893    PT Time Calculation (min) 56 min    Activity Tolerance Patient tolerated treatment well    Behavior During Therapy [facility] for tasks assessed/performed             Past Medical History:  Diagnosis Date   Breast cancer ([place]) 05/2017   right breast   Breast cancer ([place]) 06/2020   left breast IDC   Eczema    Family history of breast cancer    Family history of ovarian cancer    History of radiation therapy 10/17/17- 11/14/17   40.05 directed to the right breast in 15 fractions, followed by a boost of 10 gy given in 5 fractions.    Hyperlipidemia    Hypertension    toxemia during pregnancy, no meds now   Past Surgical History:  Procedure Laterality Date   ABDOMINAL HYSTERECTOMY     BREAST LUMPECTOMY WITH RADIOACTIVE SEED AND SENTINEL LYMPH NODE BIOPSY Right 07/17/2017   Procedure: BREAST LUMPECTOMY WITH RADIOACTIVE SEED AND SENTINEL LYMPH NODE BIOPSY;  Surgeon: Rolm Bookbinder, MD;  Location: [place];  Service: General;  Laterality: Right;   BREAST LUMPECTOMY WITH RADIOACTIVE SEED AND SENTINEL LYMPH NODE BIOPSY Left 07/13/2020   Procedure: LEFT BREAST LUMPECTOMY WITH RADIOACTIVE SEED AND LEFT AXILLARY SENTINEL LYMPH NODE BIOPSY;  Surgeon: Rolm Bookbinder, MD;  Location: [place];  Service: General;  Laterality: Left;   CESAREAN SECTION     x4   DILATION AND CURETTAGE OF UTERUS     KNEE SURGERY Right    PORT-A-CATH REMOVAL Right 09/04/2018    Procedure: REMOVAL PORT-A-CATH;  Surgeon: Rolm Bookbinder, MD;  Location: [place];  Service: General;  Laterality: Right;   PORT-A-CATH REMOVAL Right 09/05/2021   Procedure: REMOVAL PORT-A-CATH;  Surgeon: Rolm Bookbinder, MD;  Location: [place];  Service: General;  Laterality: Right;   PORTACATH PLACEMENT N/A 07/17/2017   Procedure: INSERTION PORT-A-CATH WITH [place];  Surgeon: Rolm Bookbinder, MD;  Location: [place];  Service: General;  Laterality: N/A;   PORTACATH PLACEMENT Right 07/13/2020   Procedure: INSERTION PORT-A-CATH WITH ULTRASOUND GUIDANCE;  Surgeon: Rolm Bookbinder, MD;  Location: [place];  Service: General;  Laterality: Right;   Patient Active Problem List   Diagnosis Date Noted   Age-related osteoporosis without current pathological fracture 09/08/2020   Allergic rhinitis 09/08/2020   Palpitations 09/08/2020   Personal history of malignant neoplasm of breast 09/08/2020   Personal history of transient ischemic attack (TIA), and cerebral infarction without residual deficits 09/08/2020   Pure hypercholesterolemia 09/08/2020   Vitamin D deficiency 09/08/2020   Neuropathy due to chemotherapeutic drug ([place]) 07/23/2018   Port-A-Cath in place 08/28/2017   TIA (transient ischemic attack) 08/09/2017   Hypertension    Genetic testing 07/06/2017   Family history of ovarian cancer    Family history of breast cancer  Malignant neoplasm of upper-outer quadrant of right breast in female, estrogen receptor negative ([place]) 06/26/2017   Malignant neoplasm of left breast, estrogen receptor positive ([place]) 05/19/2017    PCP: Kelton Pillar, MD  REFERRING PROVIDER: Wilber Bihari, NP  REFERRING DIAG: S/p Breast Cancer bilaterally  THERAPY DIAG:  Malignant neoplasm of upper-outer quadrant of right female breast, unspecified estrogen receptor status [facility])  Aftercare following surgery for neoplasm  Edema,  unspecified type  ONSET DATE: 09/2021  Rationale for Evaluation and Treatment Rehabilitation  SUBJECTIVE        I did the exercises given last time . I could feel the stretch the best with the trunk rotation. No tightness across chest today.                                                                                    PERTINENT HISTORY:  Pt with prior history of right breast cancer  with surgery on 07/17/17 for right lumpectomy and SLNB with 5 LN removed.  Had chemo and radiation. Most recent surgery1/25/22 for left breast lumpectomy with 0/5 positive nodes. She will have chemo, radiation and infusions for immunotherapy.   PAIN:  Are you having pain? No not presently but can be up to a 4/10 on the left NPRS scale: 0-4/10 Pain location: Bilateral axillary region Pain orientation: Bilateral  PAIN TYPE: aching and tender Pain description: intermittent  Aggravating factors: laying on the left, Relieving factors: laying on the right,MLD sometimes  PRECAUTIONS: Other: lymphedema risk  WEIGHT BEARING RESTRICTIONS No  FALLS:  Has patient fallen in last 6 months? No  LIVING ENVIRONMENT: Lives with: lives with their spouse Lives in: House/apartment Stairs: Yes; Internal: 14 steps; on right going up, and half way up on the left Has following equipment at home: None  OCCUPATION: retired  LEISURE: attending grandsons games,walking some but not regularly  HAND DOMINANCE : right   PRIOR LEVEL OF FUNCTION: Independent  PATIENT GOALS Relieve soreness in armpit area,check breast for lymphedema   OBJECTIVE  COGNITION:  Overall cognitive status: Within functional limits for tasks assessed   PALPATION: Tenderness axillary border of left pectorals,serratus/lats. mild thickening/fibrosis noted left breast proximal to nipple  OBSERVATIONS / OTHER ASSESSMENTS: left nipple appears slightly flatter,  SENSATION:  Light touch: Deficits left breast at areola incision is decreased, other  areas are fine    POSTURE: forward head, round shoulders  UPPER EXTREMITY AROM/PROM:  A/PROM RIGHT   eval   Shoulder extension 40  Shoulder flexion 148 referred mild pain into mid delt  Shoulder abduction 164  Shoulder internal rotation 75  Shoulder external rotation 86    (Blank rows = not tested)  A/PROM LEFT   eval  Shoulder extension 53  Shoulder flexion 150  Shoulder abduction 165  Shoulder internal rotation 70   Shoulder external rotation 80 pain in left shoulder    (Blank rows = not tested)   CERVICAL AROM: All within functional limits:      UPPER EXTREMITY STRENGTH: WNL   LYMPHEDEMA ASSESSMENTS:   SURGERY TYPE/DATE: Left lumpectomy 07/13/2020, Right lumpectomy 07/17/2017  NUMBER OF LYMPH NODES REMOVED: 5 right, 5 left  CHEMOTHERAPY: yes  RADIATION:yes  HORMONE TREATMENT: yes  INFECTIONS:   LYMPHEDEMA ASSESSMENTS:   LANDMARK RIGHT  12/05/2021  10 cm proximal to olecranon process 29.2  Olecranon process 24.6  10 cm proximal to ulnar styloid process 18.9  Just proximal to ulnar styloid process 14.3  Across hand at thumb web space 18.1  At base of 2nd digit 5.7  (Blank rows = not tested)  LANDMARK LEFT  12/05/2021  10 cm proximal to olecranon process 29.05  Olecranon process 24.2  10 cm proximal to ulnar styloid process 18.3  Just proximal to ulnar styloid process 13.9  Across hand at thumb web space 17.3  At base of 2nd digit 5.45  (Blank rows = not tested)     QUICK DASH SURVEY: 16%   TODAY'S TREATMENT  12/05/2021 Supine wand flexion and scaption x 3. Lower trunk rotation x 3 ea side x 10-15, wall Slides x 2 B, SB stretch with arm overhead x 2 Soft tissue mobilization to bilateral UT, pectorals, serratus Lats in supine and SL to scapular area with coco butter. PROM bilateral shoulder flexion, scaption, abduction with MFR to axilla. Pts UE circumference measured bilaterally    11/28/2021 Exercises - Supine Lower Trunk Rotation  -  2 x daily - 7 x weekly - 1 sets - 3 reps - 10 hold - Doorway Pec Stretch at 90 Degrees Abduction  - 1 x daily - 7 x weekly - 1 sets - 3 reps - 10-15 hold - Single Arm Doorway Pec Stretch at 90 Degrees Abduction  - 1 x daily - 7 x weekly - 3 sets - 10 reps - 10-15 hold Tied standing lat stretch but did not feel so not added to HEP PATIENT EDUCATION:  Education details: Access Code: [email or web address] URL: [email or web address] Date: 11/28/2021 Prepared by: Cheral Almas  Exercises - Supine Lower Trunk Rotation  - 2 x daily - 7 x weekly - 1 sets - 3 reps - 10 hold - Doorway Pec Stretch at 90 Degrees Abduction  - 1 x daily - 7 x weekly - 1 sets - 3 reps - 10-15 hold - Single Arm Doorway Pec Stretch at 90 Degrees Abduction  - 1 x daily - 7 x weekly - 3 sets - 10 reps - 10-15 hold  Person educated: Patient Education method: Customer service manager, handouts Education comprehension: verbalized understanding and returned demonstration   HOME EXERCISE PROGRAM: Single arm and bilateral arm pec stretch in doorway, LTR with arms outstretched  ASSESSMENT:  CLINICAL IMPRESSION: Palpable tightness Left greater than right pectorals, lats, serratus. Pt did not feel much stretch when performing wall slides or wand exs so not added to HEP. Pt felt good after treatment today.  OBJECTIVE IMPAIRMENTS decreased ROM, decreased strength, increased edema, increased fascial restrictions, impaired UE functional use, and pain.   ACTIVITY LIMITATIONS sleeping, reach over head, and fatigues with drying hair, folding laundry etc  PARTICIPATION LIMITATIONS: cleaning and laundry  PERSONAL FACTORS 1-2 comorbidities: bilateral breast Cancer s/p radiation and SLNB  are also affecting patient's functional outcome.   REHAB POTENTIAL: Good  CLINICAL DECISION MAKING: Stable/uncomplicated  EVALUATION COMPLEXITY: Low  GOALS: Goals reviewed with patient? Yes  SHORT TERM GOALS: Target date: 12/26/2021     Pt will be independent with HEP for stretching and strengthening bilateral UE's Baseline: Goal status: INITIAL  2.  Pt will report decreased pain/tightness by 30% Baseline:  Goal status: INITIAL    LONG TERM GOALS: Target date: 01/16/2022    Pts pain will be decreased by  50% or greater Baseline:  Goal status: INITIAL  2.  Pt will be independent with proper technique for left breast MLD Baseline:  Goal status: INITIAL  3.  Pt will be able to use arm for drying hair and folding clothes with 0- minimal fatigue Baseline:  Goal status: INITIAL    PLAN: PT FREQUENCY: 2x/week  PT DURATION: 6 weeks  PLANNED INTERVENTIONS: Therapeutic exercises, Patient/Family education, Joint mobilization, Manual lymph drainage, and Manual therapy  PLAN FOR NEXT SESSION:Review HEP, add Standing SB stretch to HEP,  initiate UE strength,STM bilateral greatest on left to pecs, lats, UT, PROM, Review MLD to left breast area to left inguinal only, progress strength to assist with arm fatigue   Claris Pong, PT 12/05/2021, 2:09 PM

## 2021-12-08 ENCOUNTER — Ambulatory Visit: Payer: Medicare PPO

## 2021-12-08 ENCOUNTER — Other Ambulatory Visit: Payer: Self-pay | Admitting: Nurse Practitioner

## 2021-12-08 DIAGNOSIS — C50411 Malignant neoplasm of upper-outer quadrant of right female breast: Secondary | ICD-10-CM | POA: Diagnosis not present

## 2021-12-08 DIAGNOSIS — R609 Edema, unspecified: Secondary | ICD-10-CM

## 2021-12-08 DIAGNOSIS — Z483 Aftercare following surgery for neoplasm: Secondary | ICD-10-CM

## 2021-12-08 DIAGNOSIS — C50412 Malignant neoplasm of upper-outer quadrant of left female breast: Secondary | ICD-10-CM | POA: Diagnosis not present

## 2021-12-08 DIAGNOSIS — Z171 Estrogen receptor negative status [ER-]: Secondary | ICD-10-CM | POA: Diagnosis not present

## 2021-12-08 DIAGNOSIS — R293 Abnormal posture: Secondary | ICD-10-CM | POA: Diagnosis not present

## 2021-12-08 NOTE — Therapy (Signed)
OUTPATIENT PHYSICAL THERAPY ONCOLOGY TREATMENT  Patient Name: April [name] MRN: [id number] DOB:1951-01-11, 71 y.o., female Today's Date: 12/08/2021   PT End of Session - 12/08/21 1612     Visit Number 3    Number of Visits 12    Date for PT Re-Evaluation 01/09/22    Authorization Type [employer]    Authorization Time Period 6/12-7/24/2023    Authorization - Visit Number 3    Authorization - Number of Visits 12    Progress Note Due on Visit 12    PT Start Time 5784    PT Stop Time 1655    PT Time Calculation (min) 41 min    Activity Tolerance Patient tolerated treatment well    Behavior During Therapy [facility] for tasks assessed/performed             Past Medical History:  Diagnosis Date   Breast cancer ([place]) 05/2017   right breast   Breast cancer ([place]) 06/2020   left breast IDC   Eczema    Family history of breast cancer    Family history of ovarian cancer    History of radiation therapy 10/17/17- 11/14/17   40.05 directed to the right breast in 15 fractions, followed by a boost of 10 gy given in 5 fractions.    Hyperlipidemia    Hypertension    toxemia during pregnancy, no meds now   Past Surgical History:  Procedure Laterality Date   ABDOMINAL HYSTERECTOMY     BREAST LUMPECTOMY WITH RADIOACTIVE SEED AND SENTINEL LYMPH NODE BIOPSY Right 07/17/2017   Procedure: BREAST LUMPECTOMY WITH RADIOACTIVE SEED AND SENTINEL LYMPH NODE BIOPSY;  Surgeon: Rolm Bookbinder, MD;  Location: [place];  Service: General;  Laterality: Right;   BREAST LUMPECTOMY WITH RADIOACTIVE SEED AND SENTINEL LYMPH NODE BIOPSY Left 07/13/2020   Procedure: LEFT BREAST LUMPECTOMY WITH RADIOACTIVE SEED AND LEFT AXILLARY SENTINEL LYMPH NODE BIOPSY;  Surgeon: Rolm Bookbinder, MD;  Location: [place];  Service: General;  Laterality: Left;   CESAREAN SECTION     x4   DILATION AND CURETTAGE OF UTERUS     KNEE SURGERY Right    PORT-A-CATH REMOVAL Right 09/04/2018    Procedure: REMOVAL PORT-A-CATH;  Surgeon: Rolm Bookbinder, MD;  Location: [place];  Service: General;  Laterality: Right;   PORT-A-CATH REMOVAL Right 09/05/2021   Procedure: REMOVAL PORT-A-CATH;  Surgeon: Rolm Bookbinder, MD;  Location: [place];  Service: General;  Laterality: Right;   PORTACATH PLACEMENT N/A 07/17/2017   Procedure: INSERTION PORT-A-CATH WITH [place];  Surgeon: Rolm Bookbinder, MD;  Location: [place];  Service: General;  Laterality: N/A;   PORTACATH PLACEMENT Right 07/13/2020   Procedure: INSERTION PORT-A-CATH WITH ULTRASOUND GUIDANCE;  Surgeon: Rolm Bookbinder, MD;  Location: [place];  Service: General;  Laterality: Right;   Patient Active Problem List   Diagnosis Date Noted   Age-related osteoporosis without current pathological fracture 09/08/2020   Allergic rhinitis 09/08/2020   Palpitations 09/08/2020   Personal history of malignant neoplasm of breast 09/08/2020   Personal history of transient ischemic attack (TIA), and cerebral infarction without residual deficits 09/08/2020   Pure hypercholesterolemia 09/08/2020   Vitamin D deficiency 09/08/2020   Neuropathy due to chemotherapeutic drug ([place]) 07/23/2018   Port-A-Cath in place 08/28/2017   TIA (transient ischemic attack) 08/09/2017   Hypertension    Genetic testing 07/06/2017   Family history of ovarian cancer    Family history of breast cancer  Malignant neoplasm of upper-outer quadrant of right breast in female, estrogen receptor negative ([place]) 06/26/2017   Malignant neoplasm of left breast, estrogen receptor positive ([place]) 05/19/2017    PCP: Kelton Pillar, MD  REFERRING PROVIDER: Wilber Bihari, NP  REFERRING DIAG: S/p Breast Cancer bilaterally  THERAPY DIAG:  Malignant neoplasm of upper-outer quadrant of right female breast, unspecified estrogen receptor status [facility])  Aftercare following surgery for neoplasm  Edema,  unspecified type  ONSET DATE: 09/2021  Rationale for Evaluation and Treatment Rehabilitation  SUBJECTIVE        I felt a little soreness bilaterally at the lateral trunk after last visit but it went away after I took a Bath.  No tightness across chest today. I felt some hardness at the areola last night. I had to shift to sleep comfortably.  A little soreness persists in the left breast. Underarm pain is overall improved bilaterally.                                                                                     PERTINENT HISTORY:  Pt with prior history of right breast cancer  with surgery on 07/17/17 for right lumpectomy and SLNB with 5 LN removed.  Had chemo and radiation. Most recent surgery1/25/22 for left breast lumpectomy with 0/5 positive nodes. She had chemo, radiation and infusions for immunotherapy.   PAIN:  Are you having pain? No not presently but can be up to a 3/10 on the left NPRS scale: 0-4/10 Pain location: Bilateral axillary region Pain orientation: Bilateral  PAIN TYPE: aching and tender Pain description: intermittent  Aggravating factors: laying on the left, Relieving factors: laying on the right,MLD sometimes  PRECAUTIONS: Other: lymphedema risk  WEIGHT BEARING RESTRICTIONS No  FALLS:  Has patient fallen in last 6 months? No  LIVING ENVIRONMENT: Lives with: lives with their spouse Lives in: House/apartment Stairs: Yes; Internal: 14 steps; on right going up, and half way up on the left Has following equipment at home: None  OCCUPATION: retired  LEISURE: attending grandsons games,walking some but not regularly  HAND DOMINANCE : right   PRIOR LEVEL OF FUNCTION: Independent  PATIENT GOALS Relieve soreness in armpit area,check breast for lymphedema   OBJECTIVE  COGNITION:  Overall cognitive status: Within functional limits for tasks assessed   PALPATION: Tenderness axillary border of left pectorals,serratus/lats. mild thickening/fibrosis noted  left breast proximal to nipple  OBSERVATIONS / OTHER ASSESSMENTS: left nipple appears slightly flatter,  SENSATION:  Light touch: Deficits left breast at areola incision is decreased, other areas are fine    POSTURE: forward head, round shoulders  UPPER EXTREMITY AROM/PROM:  A/PROM RIGHT   eval   Shoulder extension 40  Shoulder flexion 148 referred mild pain into mid delt  Shoulder abduction 164  Shoulder internal rotation 75  Shoulder external rotation 86    (Blank rows = not tested)  A/PROM LEFT   eval  Shoulder extension 53  Shoulder flexion 150  Shoulder abduction 165  Shoulder internal rotation 70   Shoulder external rotation 80 pain in left shoulder    (Blank rows = not tested)   CERVICAL AROM: All within functional limits:  UPPER EXTREMITY STRENGTH: WNL   LYMPHEDEMA ASSESSMENTS:   SURGERY TYPE/DATE: Left lumpectomy 07/13/2020, Right lumpectomy 07/17/2017  NUMBER OF LYMPH NODES REMOVED: 5 right, 5 left  CHEMOTHERAPY: yes  RADIATION:yes  HORMONE TREATMENT: yes  INFECTIONS:   LYMPHEDEMA ASSESSMENTS:   LANDMARK RIGHT  12/05/2021  10 cm proximal to olecranon process 29.2  Olecranon process 24.6  10 cm proximal to ulnar styloid process 18.9  Just proximal to ulnar styloid process 14.3  Across hand at thumb web space 18.1  At base of 2nd digit 5.7  (Blank rows = not tested)  LANDMARK LEFT  12/05/2021  10 cm proximal to olecranon process 29.05  Olecranon process 24.2  10 cm proximal to ulnar styloid process 18.3  Just proximal to ulnar styloid process 13.9  Across hand at thumb web space 17.3  At base of 2nd digit 5.45  (Blank rows = not tested)     QUICK DASH SURVEY: 16%   TODAY'S TREATMENT   12/08/2021 Soft tissue mobilization to bilateral UT, pectorals, serratus Lats in supine and SL to scapular area with coco butter. PROM bilateral shoulder flexion, scaption, abduction with MFR to axilla. MLD to left lateral breast; left  spraclavicular, left Inguinal LN's, left lateral breast directing to pathway and repeating pathway and ending with LN's 12/05/2021 Supine wand flexion and scaption x 3. Lower trunk rotation x 3 ea side x 10-15, wall Slides x 2 B, SB stretch with arm overhead x 2 Soft tissue mobilization to bilateral UT, pectorals, serratus Lats in supine and SL to scapular area with coco butter. PROM bilateral shoulder flexion, scaption, abduction with MFR to axilla. Pts UE circumference measured bilaterally    11/28/2021 Exercises - Supine Lower Trunk Rotation  - 2 x daily - 7 x weekly - 1 sets - 3 reps - 10 hold - Doorway Pec Stretch at 90 Degrees Abduction  - 1 x daily - 7 x weekly - 1 sets - 3 reps - 10-15 hold - Single Arm Doorway Pec Stretch at 90 Degrees Abduction  - 1 x daily - 7 x weekly - 3 sets - 10 reps - 10-15 hold Tied standing lat stretch but did not feel so not added to HEP PATIENT EDUCATION:  Education details: Access Code: [email or web address] URL: [email or web address] Date: 11/28/2021 Prepared by: Cheral Almas  Exercises - Supine Lower Trunk Rotation  - 2 x daily - 7 x weekly - 1 sets - 3 reps - 10 hold - Doorway Pec Stretch at 90 Degrees Abduction  - 1 x daily - 7 x weekly - 1 sets - 3 reps - 10-15 hold - Single Arm Doorway Pec Stretch at 90 Degrees Abduction  - 1 x daily - 7 x weekly - 3 sets - 10 reps - 10-15 hold  Person educated: Patient Education method: Customer service manager, handouts Education comprehension: verbalized understanding and returned demonstration   HOME EXERCISE PROGRAM: Single arm and bilateral arm pec stretch in doorway, LTR with arms outstretched  ASSESSMENT:  CLINICAL IMPRESSION: Pt notes good improvement in bilateral underarm pain. She continues with breast tenderness around the areola at times, but had no tenderness with MLD today. No fibrosis noted in breast this afternoon. She is compliant with HEP  OBJECTIVE IMPAIRMENTS decreased ROM,  decreased strength, increased edema, increased fascial restrictions, impaired UE functional use, and pain.   ACTIVITY LIMITATIONS sleeping, reach over head, and fatigues with drying hair, folding laundry etc  PARTICIPATION LIMITATIONS: cleaning and laundry  PERSONAL FACTORS 1-2 comorbidities: bilateral breast  Cancer s/p radiation and SLNB  are also affecting patient's functional outcome.   REHAB POTENTIAL: Good  CLINICAL DECISION MAKING: Stable/uncomplicated  EVALUATION COMPLEXITY: Low  GOALS: Goals reviewed with patient? Yes  SHORT TERM GOALS: Target date: 12/29/2021    Pt will be independent with HEP for stretching and strengthening bilateral UE's Baseline: Goal status: INITIAL  2.  Pt will report decreased pain/tightness by 30% Baseline:  Goal status: INITIAL    LONG TERM GOALS: Target date: 01/19/2022    Pts pain will be decreased by 50% or greater Baseline:  Goal status: INITIAL  2.  Pt will be independent with proper technique for left breast MLD Baseline:  Goal status: INITIAL  3.  Pt will be able to use arm for drying hair and folding clothes with 0- minimal fatigue Baseline:  Goal status: INITIAL    PLAN: PT FREQUENCY: 2x/week  PT DURATION: 6 weeks  PLANNED INTERVENTIONS: Therapeutic exercises, Patient/Family education, Joint mobilization, Manual lymph drainage, and Manual therapy  PLAN FOR NEXT SESSION:Review HEP, add Standing SB stretch to HEP,  initiate UE strength,STM bilateral greatest on left to pecs, lats, UT, PROM, Review MLD to left breast area to left inguinal only, progress strength to assist with arm fatigue   Claris Pong, PT 12/08/2021, 5:04 PM

## 2021-12-10 ENCOUNTER — Other Ambulatory Visit: Payer: Self-pay | Admitting: Nurse Practitioner

## 2021-12-12 ENCOUNTER — Ambulatory Visit: Payer: Medicare PPO

## 2021-12-12 DIAGNOSIS — R293 Abnormal posture: Secondary | ICD-10-CM

## 2021-12-12 DIAGNOSIS — Z483 Aftercare following surgery for neoplasm: Secondary | ICD-10-CM

## 2021-12-12 DIAGNOSIS — C50411 Malignant neoplasm of upper-outer quadrant of right female breast: Secondary | ICD-10-CM | POA: Diagnosis not present

## 2021-12-12 DIAGNOSIS — C50412 Malignant neoplasm of upper-outer quadrant of left female breast: Secondary | ICD-10-CM | POA: Diagnosis not present

## 2021-12-12 DIAGNOSIS — Z171 Estrogen receptor negative status [ER-]: Secondary | ICD-10-CM | POA: Diagnosis not present

## 2021-12-12 DIAGNOSIS — R609 Edema, unspecified: Secondary | ICD-10-CM

## 2021-12-15 ENCOUNTER — Ambulatory Visit: Payer: Medicare PPO

## 2021-12-15 DIAGNOSIS — R293 Abnormal posture: Secondary | ICD-10-CM | POA: Diagnosis not present

## 2021-12-15 DIAGNOSIS — R609 Edema, unspecified: Secondary | ICD-10-CM | POA: Diagnosis not present

## 2021-12-15 DIAGNOSIS — C50411 Malignant neoplasm of upper-outer quadrant of right female breast: Secondary | ICD-10-CM | POA: Diagnosis not present

## 2021-12-15 DIAGNOSIS — C50412 Malignant neoplasm of upper-outer quadrant of left female breast: Secondary | ICD-10-CM | POA: Diagnosis not present

## 2021-12-15 DIAGNOSIS — Z483 Aftercare following surgery for neoplasm: Secondary | ICD-10-CM

## 2021-12-15 DIAGNOSIS — Z171 Estrogen receptor negative status [ER-]: Secondary | ICD-10-CM | POA: Diagnosis not present

## 2021-12-15 NOTE — Therapy (Signed)
OUTPATIENT PHYSICAL THERAPY ONCOLOGY TREATMENT  Patient Name: April [name] MRN: [id number] DOB:04-18-1951, 71 y.o., female Today's Date: 12/15/2021   PT End of Session - 12/15/21 1301     Visit Number 5    Number of Visits 12    Date for PT Re-Evaluation 01/09/22    Authorization Time Period 6/12-7/24/2023    Authorization - Visit Number 5    Authorization - Number of Visits 12    Progress Note Due on Visit 12    PT Start Time 1324    PT Stop Time 4010    PT Time Calculation (min) 50 min             Past Medical History:  Diagnosis Date   Breast cancer ([place]) 05/2017   right breast   Breast cancer ([place]) 06/2020   left breast IDC   Eczema    Family history of breast cancer    Family history of ovarian cancer    History of radiation therapy 10/17/17- 11/14/17   40.05 directed to the right breast in 15 fractions, followed by a boost of 10 gy given in 5 fractions.    Hyperlipidemia    Hypertension    toxemia during pregnancy, no meds now   Past Surgical History:  Procedure Laterality Date   ABDOMINAL HYSTERECTOMY     BREAST LUMPECTOMY WITH RADIOACTIVE SEED AND SENTINEL LYMPH NODE BIOPSY Right 07/17/2017   Procedure: BREAST LUMPECTOMY WITH RADIOACTIVE SEED AND SENTINEL LYMPH NODE BIOPSY;  Surgeon: Rolm Bookbinder, MD;  Location: [place];  Service: General;  Laterality: Right;   BREAST LUMPECTOMY WITH RADIOACTIVE SEED AND SENTINEL LYMPH NODE BIOPSY Left 07/13/2020   Procedure: LEFT BREAST LUMPECTOMY WITH RADIOACTIVE SEED AND LEFT AXILLARY SENTINEL LYMPH NODE BIOPSY;  Surgeon: Rolm Bookbinder, MD;  Location: [place];  Service: General;  Laterality: Left;   CESAREAN SECTION     x4   DILATION AND CURETTAGE OF UTERUS     KNEE SURGERY Right    PORT-A-CATH REMOVAL Right 09/04/2018   Procedure: REMOVAL PORT-A-CATH;  Surgeon: Rolm Bookbinder, MD;  Location: [place];  Service: General;  Laterality: Right;    PORT-A-CATH REMOVAL Right 09/05/2021   Procedure: REMOVAL PORT-A-CATH;  Surgeon: Rolm Bookbinder, MD;  Location: [place];  Service: General;  Laterality: Right;   PORTACATH PLACEMENT N/A 07/17/2017   Procedure: INSERTION PORT-A-CATH WITH [place];  Surgeon: Rolm Bookbinder, MD;  Location: [place];  Service: General;  Laterality: N/A;   PORTACATH PLACEMENT Right 07/13/2020   Procedure: INSERTION PORT-A-CATH WITH ULTRASOUND GUIDANCE;  Surgeon: Rolm Bookbinder, MD;  Location: [place];  Service: General;  Laterality: Right;   Patient Active Problem List   Diagnosis Date Noted   Age-related osteoporosis without current pathological fracture 09/08/2020   Allergic rhinitis 09/08/2020   Palpitations 09/08/2020   Personal history of malignant neoplasm of breast 09/08/2020   Personal history of transient ischemic attack (TIA), and cerebral infarction without residual deficits 09/08/2020   Pure hypercholesterolemia 09/08/2020   Vitamin D deficiency 09/08/2020   Neuropathy due to chemotherapeutic drug ([place]) 07/23/2018   Port-A-Cath in place 08/28/2017   TIA (transient ischemic attack) 08/09/2017   Hypertension    Genetic testing 07/06/2017   Family history of ovarian cancer    Family history of breast cancer    Malignant neoplasm of upper-outer quadrant of right breast in female, estrogen receptor negative ([place]) 06/26/2017   Malignant neoplasm of left breast, estrogen receptor  positive ([place]) 05/19/2017    PCP: Kelton Pillar, MD  REFERRING PROVIDER: Wilber Bihari, NP  REFERRING DIAG: S/p Breast Cancer bilaterally  THERAPY DIAG:  Malignant neoplasm of upper-outer quadrant of right female breast, unspecified estrogen receptor status [facility])  Aftercare following surgery for neoplasm  Edema, unspecified type  ONSET DATE: 09/2021  Rationale for Evaluation and Treatment Rehabilitation  SUBJECTIVE        I'm feeling good, but I have been  really busy so I didn't do any exercise. Have a mild underarm soreness. Able to dry hair with less fatigue                                                                                   PERTINENT HISTORY:  Pt with prior history of right breast cancer  with surgery on 07/17/17 for right lumpectomy and SLNB with 5 LN removed.  Had chemo and radiation. Most recent surgery1/25/22 for left breast lumpectomy with 0/5 positive nodes. She had chemo, radiation and infusions for immunotherapy.   PAIN:  Are you having pain? No not presently but can be up to a 3/10 on the left NPRS scale: 0-3/10 Pain location: Bilateral axillary region Pain orientation: Bilateral  PAIN TYPE: aching and tender Pain description: intermittent  Aggravating factors: laying on the left, Relieving factors: laying on the right,MLD sometimes  PRECAUTIONS: Other: lymphedema risk  WEIGHT BEARING RESTRICTIONS No  FALLS:  Has patient fallen in last 6 months? No  LIVING ENVIRONMENT: Lives with: lives with their spouse Lives in: House/apartment Stairs: Yes; Internal: 14 steps; on right going up, and half way up on the left Has following equipment at home: None  OCCUPATION: retired  LEISURE: attending grandsons games,walking some but not regularly  HAND DOMINANCE : right   PRIOR LEVEL OF FUNCTION: Independent  PATIENT GOALS Relieve soreness in armpit area,check breast for lymphedema   OBJECTIVE  COGNITION:  Overall cognitive status: Within functional limits for tasks assessed   PALPATION: Tenderness axillary border of left pectorals,serratus/lats. mild thickening/fibrosis noted left breast proximal to nipple  OBSERVATIONS / OTHER ASSESSMENTS: left nipple appears slightly flatter,  SENSATION:  Light touch: Deficits left breast at areola incision is decreased, other areas are fine    POSTURE: forward head, round shoulders  UPPER EXTREMITY AROM/PROM:  A/PROM RIGHT   eval   Shoulder extension 40   Shoulder flexion 148 referred mild pain into mid delt  Shoulder abduction 164  Shoulder internal rotation 75  Shoulder external rotation 86    (Blank rows = not tested)  A/PROM LEFT   eval  Shoulder extension 53  Shoulder flexion 150  Shoulder abduction 165  Shoulder internal rotation 70   Shoulder external rotation 80 pain in left shoulder    (Blank rows = not tested)   CERVICAL AROM: All within functional limits:      UPPER EXTREMITY STRENGTH: WNL   LYMPHEDEMA ASSESSMENTS:   SURGERY TYPE/DATE: Left lumpectomy 07/13/2020, Right lumpectomy 07/17/2017  NUMBER OF LYMPH NODES REMOVED: 5 right, 5 left  CHEMOTHERAPY: yes  RADIATION:yes  HORMONE TREATMENT: yes  INFECTIONS:   LYMPHEDEMA ASSESSMENTS:   LANDMARK RIGHT  12/05/2021  10 cm proximal to olecranon process  29.2  Olecranon process 24.6  10 cm proximal to ulnar styloid process 18.9  Just proximal to ulnar styloid process 14.3  Across hand at thumb web space 18.1  At base of 2nd digit 5.7  (Blank rows = not tested)  LANDMARK LEFT  12/05/2021  10 cm proximal to olecranon process 29.05  Olecranon process 24.2  10 cm proximal to ulnar styloid process 18.3  Just proximal to ulnar styloid process 13.9  Across hand at thumb web space 17.3  At base of 2nd digit 5.45  (Blank rows = not tested)      QUICK DASH SURVEY: 16%   TODAY'S TREATMENT  12/14/2021 Soft tissue mobilization to bilateral UT, pectorals, serratus Lats in supine and SL to scapular area with coco butter. PROM bilateral shoulder flexion, scaption, abduction with MFR to axilla. Bilateral AROM flexion, scaption, abduction Supine horizontal abduction yellow x10, sword bilaterally x 5 Standing bilateral ER x 5 with yellow, biceps curls yellow 2 x 5 Standing bilateral shoulder flexion 2 x 5, scaption 1# 2 x 5  12/12/2021 Soft tissue mobilization to bilateral UT, pectorals, serratus Lats in supine and SL to scapular area with coco butter. PROM  bilateral shoulder flexion, scaption, abduction with MFR to axilla. Supine over lengthwise rolled towel holding ppt; shoulder flexion, scaption, horizontal abduction x5 Redid SOZO and changed to unilateral 12/08/2021 Soft tissue mobilization to bilateral UT, pectorals, serratus Lats in supine and SL to scapular area with coco butter. PROM bilateral shoulder flexion, scaption, abduction with MFR to axilla. MLD to left lateral breast; left spraclavicular, left Inguinal LN's, left lateral breast directing to pathway and repeating pathway and ending with LN's 12/05/2021 Supine wand flexion and scaption x 3. Lower trunk rotation x 3 ea side x 10-15, wall Slides x 2 B, SB stretch with arm overhead x 2 Soft tissue mobilization to bilateral UT, pectorals, serratus Lats in supine and SL to scapular area with coco butter. PROM bilateral shoulder flexion, scaption, abduction with MFR to axilla. Pts UE circumference measured bilaterally    11/28/2021 Exercises - Supine Lower Trunk Rotation  - 2 x daily - 7 x weekly - 1 sets - 3 reps - 10 hold - Doorway Pec Stretch at 90 Degrees Abduction  - 1 x daily - 7 x weekly - 1 sets - 3 reps - 10-15 hold - Single Arm Doorway Pec Stretch at 90 Degrees Abduction  - 1 x daily - 7 x weekly - 3 sets - 10 reps - 10-15 hold Tied standing lat stretch but did not feel so not added to HEP PATIENT EDUCATION:  Education details: Access Code: [email or web address] URL: [email or web address] Date: 11/28/2021 Prepared by: Cheral Almas  Exercises - Supine Lower Trunk Rotation  - 2 x daily - 7 x weekly - 1 sets - 3 reps - 10 hold - Doorway Pec Stretch at 90 Degrees Abduction  - 1 x daily - 7 x weekly - 1 sets - 3 reps - 10-15 hold - Single Arm Doorway Pec Stretch at 90 Degrees Abduction  - 1 x daily - 7 x weekly - 3 sets - 10 reps - 10-15 hold  Person educated: Patient Education method: Customer service manager, handouts Education comprehension: verbalized  understanding and returned demonstration   HOME EXERCISE PROGRAM: Single arm and bilateral arm pec stretch in doorway, LTR with arms outstretched  ASSESSMENT:  CLINICAL IMPRESSION: Tenderness at axillary border of pectorals and lateral trunk continues.pt did well with exercises but needed manual guidance for proper form  with sword exercise in supine. Minimal fatigue after exercises today.    OBJECTIVE IMPAIRMENTS decreased ROM, decreased strength, increased edema, increased fascial restrictions, impaired UE functional use, and pain.   ACTIVITY LIMITATIONS sleeping, reach over head, and fatigues with drying hair, folding laundry etc  PARTICIPATION LIMITATIONS: cleaning and laundry  PERSONAL FACTORS 1-2 comorbidities: bilateral breast Cancer s/p radiation and SLNB  are also affecting patient's functional outcome.   REHAB POTENTIAL: Good  CLINICAL DECISION MAKING: Stable/uncomplicated  EVALUATION COMPLEXITY: Low  GOALS: Goals reviewed with patient? Yes  SHORT TERM GOALS: Target date: 01/05/2022    Pt will be independent with HEP for stretching and strengthening bilateral UE's Baseline: Goal status: MET 12/15/2021  2.  Pt will report decreased pain/tightness by 30% Baseline:  Goal status: INITIAL    LONG TERM GOALS: Target date: 01/26/2022    Pts pain will be decreased by 50% or greater Baseline:  Goal status: INITIAL  2.  Pt will be independent with proper technique for left breast MLD Baseline:  Goal status: INITIAL  3.  Pt will be able to use arm for drying hair and folding clothes with 0- minimal fatigue Baseline:  Goal status: INITIAL    PLAN: PT FREQUENCY: 2x/week  PT DURATION: 6 weeks  PLANNED INTERVENTIONS: Therapeutic exercises, Patient/Family education, Joint mobilization, Manual lymph drainage, and Manual therapy  PLAN FOR NEXT SESSION:Review HEP,  initiate UE strength,STM bilateral greatest on left to pecs, lats, UT, PROM, Review MLD to left  breast area to left inguinal only, progress strength to assist with arm fatigue   Claris Pong, PT 12/15/2021, 1:53 PM

## 2021-12-19 ENCOUNTER — Encounter: Payer: Self-pay | Admitting: Hematology and Oncology

## 2021-12-19 ENCOUNTER — Ambulatory Visit: Payer: Medicare PPO | Attending: Adult Health | Admitting: Physical Therapy

## 2021-12-19 DIAGNOSIS — Z483 Aftercare following surgery for neoplasm: Secondary | ICD-10-CM | POA: Insufficient documentation

## 2021-12-19 DIAGNOSIS — C50411 Malignant neoplasm of upper-outer quadrant of right female breast: Secondary | ICD-10-CM | POA: Insufficient documentation

## 2021-12-19 DIAGNOSIS — R293 Abnormal posture: Secondary | ICD-10-CM | POA: Insufficient documentation

## 2021-12-19 DIAGNOSIS — C50412 Malignant neoplasm of upper-outer quadrant of left female breast: Secondary | ICD-10-CM | POA: Insufficient documentation

## 2021-12-19 DIAGNOSIS — R609 Edema, unspecified: Secondary | ICD-10-CM | POA: Insufficient documentation

## 2021-12-19 NOTE — Therapy (Signed)
OUTPATIENT PHYSICAL THERAPY ONCOLOGY TREATMENT  Patient Name: April [name] MRN: [id number] DOB:[birth year], 71 y.o., female Today's Date: 12/19/2021   PT End of Session - 12/19/21 1454     Visit Number 6    Number of Visits 12    Date for PT Re-Evaluation 01/09/22    Authorization Type [employer]    Authorization Time Period 6/12-7/24/2023    Authorization - Visit Number 6    Authorization - Number of Visits 12    Progress Note Due on Visit 12    PT Start Time 6767    PT Stop Time 2094    PT Time Calculation (min) 52 min    Activity Tolerance Patient tolerated treatment well    Behavior During Therapy [facility] for tasks assessed/performed              Past Medical History:  Diagnosis Date   Breast cancer ([place]) 05/2017   right breast   Breast cancer ([place]) 06/2020   left breast IDC   Eczema    Family history of breast cancer    Family history of ovarian cancer    History of radiation therapy 10/17/17- 11/14/17   40.05 directed to the right breast in 15 fractions, followed by a boost of 10 gy given in 5 fractions.    Hyperlipidemia    Hypertension    toxemia during pregnancy, no meds now   Past Surgical History:  Procedure Laterality Date   ABDOMINAL HYSTERECTOMY     BREAST LUMPECTOMY WITH RADIOACTIVE SEED AND SENTINEL LYMPH NODE BIOPSY Right 07/17/2017   Procedure: BREAST LUMPECTOMY WITH RADIOACTIVE SEED AND SENTINEL LYMPH NODE BIOPSY;  Surgeon: Rolm Bookbinder, MD;  Location: [place];  Service: General;  Laterality: Right;   BREAST LUMPECTOMY WITH RADIOACTIVE SEED AND SENTINEL LYMPH NODE BIOPSY Left 07/13/2020   Procedure: LEFT BREAST LUMPECTOMY WITH RADIOACTIVE SEED AND LEFT AXILLARY SENTINEL LYMPH NODE BIOPSY;  Surgeon: Rolm Bookbinder, MD;  Location: [place];  Service: General;  Laterality: Left;   CESAREAN SECTION     x4   DILATION AND CURETTAGE OF UTERUS     KNEE SURGERY Right    PORT-A-CATH REMOVAL Right 09/04/2018    Procedure: REMOVAL PORT-A-CATH;  Surgeon: Rolm Bookbinder, MD;  Location: [place];  Service: General;  Laterality: Right;   PORT-A-CATH REMOVAL Right 09/05/2021   Procedure: REMOVAL PORT-A-CATH;  Surgeon: Rolm Bookbinder, MD;  Location: [place];  Service: General;  Laterality: Right;   PORTACATH PLACEMENT N/A 07/17/2017   Procedure: INSERTION PORT-A-CATH WITH [place];  Surgeon: Rolm Bookbinder, MD;  Location: [place];  Service: General;  Laterality: N/A;   PORTACATH PLACEMENT Right 07/13/2020   Procedure: INSERTION PORT-A-CATH WITH ULTRASOUND GUIDANCE;  Surgeon: Rolm Bookbinder, MD;  Location: [place];  Service: General;  Laterality: Right;   Patient Active Problem List   Diagnosis Date Noted   Age-related osteoporosis without current pathological fracture 09/08/2020   Allergic rhinitis 09/08/2020   Palpitations 09/08/2020   Personal history of malignant neoplasm of breast 09/08/2020   Personal history of transient ischemic attack (TIA), and cerebral infarction without residual deficits 09/08/2020   Pure hypercholesterolemia 09/08/2020   Vitamin D deficiency 09/08/2020   Neuropathy due to chemotherapeutic drug ([place]) 07/23/2018   Port-A-Cath in place 08/28/2017   TIA (transient ischemic attack) 08/09/2017   Hypertension    Genetic testing 07/06/2017   Family history of ovarian cancer    Family history of breast cancer  Malignant neoplasm of upper-outer quadrant of right breast in female, estrogen receptor negative ([place]) 06/26/2017   Malignant neoplasm of left breast, estrogen receptor positive ([place]) 05/19/2017    PCP: Kelton Pillar, MD  REFERRING PROVIDER: Wilber Bihari, NP  REFERRING DIAG: S/p Breast Cancer bilaterally  THERAPY DIAG:  Aftercare following surgery for neoplasm  Edema, unspecified type  Abnormal posture  Malignant neoplasm of upper-outer quadrant of right female breast, unspecified  estrogen receptor status ([place])  Malignant neoplasm of upper-outer quadrant of left female breast, unspecified estrogen receptor status ([place])  ONSET DATE: 09/2021  Rationale for Evaluation and Treatment Rehabilitation  SUBJECTIVE   Pt states that her breast lymphedema is improved.  The breast tissue is softer and the soft tissue work is helping and her shoulders are getting a little stronger     PERTINENT HISTORY:  Pt with prior history of right breast cancer  with surgery on 07/17/17 for right lumpectomy and SLNB with 5 LN removed.  Had chemo and radiation. Most recent surgery1/25/22 for left breast lumpectomy with 0/5 positive nodes. She had chemo, radiation and infusions for immunotherapy and completed all in February 2023   PAIN:  Are you having pain? No n PRECAUTIONS: Other: lymphedema risk  WEIGHT BEARING RESTRICTIONS No  FALLS:  Has patient fallen in last 6 months? No  LIVING ENVIRONMENT: Lives with: lives with their spouse Lives in: House/apartment Stairs: Yes; Internal: 14 steps; on right going up, and half way up on the left Has following equipment at home: None  OCCUPATION: retired  LEISURE: attending grandsons games,walking some but not regularly  HAND DOMINANCE : right   PRIOR LEVEL OF FUNCTION: Independent  PATIENT GOALS Relieve soreness in armpit area,check breast for lymphedema   OBJECTIVE  COGNITION:  Overall cognitive status: Within functional limits for tasks assessed   PALPATION: Tenderness axillary border of left pectorals,serratus/lats. mild thickening/fibrosis noted left breast proximal to nipple  OBSERVATIONS / OTHER ASSESSMENTS: left nipple appears slightly flatter,  SENSATION:  Light touch: Deficits left breast at areola incision is decreased, other areas are fine    POSTURE: forward head, round shoulders  UPPER EXTREMITY AROM/PROM:  A/PROM RIGHT   eval   Shoulder extension 40  Shoulder flexion 148 referred mild pain into mid delt   Shoulder abduction 164  Shoulder internal rotation 75  Shoulder external rotation 86    (Blank rows = not tested)  A/PROM LEFT   eval  Shoulder extension 53  Shoulder flexion 150  Shoulder abduction 165  Shoulder internal rotation 70   Shoulder external rotation 80 pain in left shoulder    (Blank rows = not tested)   CERVICAL AROM: All within functional limits:      UPPER EXTREMITY STRENGTH: WNL   LYMPHEDEMA ASSESSMENTS:   SURGERY TYPE/DATE: Left lumpectomy 07/13/2020, Right lumpectomy 07/17/2017  NUMBER OF LYMPH NODES REMOVED: 5 right, 5 left  CHEMOTHERAPY: yes  RADIATION:yes  HORMONE TREATMENT: yes  INFECTIONS:   LYMPHEDEMA ASSESSMENTS:   LANDMARK RIGHT  12/05/2021  10 cm proximal to olecranon process 29.2  Olecranon process 24.6  10 cm proximal to ulnar styloid process 18.9  Just proximal to ulnar styloid process 14.3  Across hand at thumb web space 18.1  At base of 2nd digit 5.7  (Blank rows = not tested)  LANDMARK LEFT  12/05/2021  10 cm proximal to olecranon process 29.05  Olecranon process 24.2  10 cm proximal to ulnar styloid process 18.3  Just proximal to ulnar styloid process 13.9  Across  hand at thumb web space 17.3  At base of 2nd digit 5.45  (Blank rows = not tested)      QUICK DASH SURVEY: 16%   TODAY'S TREATMENT  12/19/2021: perfromed ROM to neck, shouders, scapula and upper thoracic spine Pt with tightness with trunk rotation.  Manual lymph drainage then soft tissue work with cocoa butter to left anterior and posterior shoulder, axilla and lateral trunk  in supine and right sidelying with left arm overhead for lateral trunk stretch. Pt instructed to "pin" left axillary area with right hand while she does circles with left arm overhead for AROM and gently muscle contractions. Finished up with soft tissue work to left pec muscle. Pt encouraged to return to yoga and even possibly aqua exercises offerred throught [employer] to continue  with flexibility exericises and improve fitness  12/14/2021 Soft tissue mobilization to bilateral UT, pectorals, serratus Lats in supine and SL to scapular area with coco butter. PROM bilateral shoulder flexion, scaption, abduction with MFR to axilla. Bilateral AROM flexion, scaption, abduction Supine horizontal abduction yellow x10, sword bilaterally x 5 Standing bilateral ER x 5 with yellow, biceps curls yellow 2 x 5 Standing bilateral shoulder flexion 2 x 5, scaption 1# 2 x 5  12/12/2021 Soft tissue mobilization to bilateral UT, pectorals, serratus Lats in supine and SL to scapular area with coco butter. PROM bilateral shoulder flexion, scaption, abduction with MFR to axilla. Supine over lengthwise rolled towel holding ppt; shoulder flexion, scaption, horizontal abduction x5 Redid SOZO and changed to unilateral 12/08/2021 Soft tissue mobilization to bilateral UT, pectorals, serratus Lats in supine and SL to scapular area with coco butter. PROM bilateral shoulder flexion, scaption, abduction with MFR to axilla. MLD to left lateral breast; left spraclavicular, left Inguinal LN's, left lateral breast directing to pathway and repeating pathway and ending with LN's 12/05/2021 Supine wand flexion and scaption x 3. Lower trunk rotation x 3 ea side x 10-15, wall Slides x 2 B, SB stretch with arm overhead x 2 Soft tissue mobilization to bilateral UT, pectorals, serratus Lats in supine and SL to scapular area with coco butter. PROM bilateral shoulder flexion, scaption, abduction with MFR to axilla. Pts UE circumference measured bilaterally    11/28/2021 Exercises - Supine Lower Trunk Rotation  - 2 x daily - 7 x weekly - 1 sets - 3 reps - 10 hold - Doorway Pec Stretch at 90 Degrees Abduction  - 1 x daily - 7 x weekly - 1 sets - 3 reps - 10-15 hold - Single Arm Doorway Pec Stretch at 90 Degrees Abduction  - 1 x daily - 7 x weekly - 3 sets - 10 reps - 10-15 hold Tied standing lat stretch but did not  feel so not added to HEP PATIENT EDUCATION:  Education details: Access Code: [email or web address] URL: [email or web address] Date: 11/28/2021 Prepared by: Cheral Almas  Exercises - Supine Lower Trunk Rotation  - 2 x daily - 7 x weekly - 1 sets - 3 reps - 10 hold - Doorway Pec Stretch at 90 Degrees Abduction  - 1 x daily - 7 x weekly - 1 sets - 3 reps - 10-15 hold - Single Arm Doorway Pec Stretch at 90 Degrees Abduction  - 1 x daily - 7 x weekly - 3 sets - 10 reps - 10-15 hold  Person educated: Patient Education method: Customer service manager, handouts Education comprehension: verbalized understanding and returned demonstration   HOME EXERCISE PROGRAM: Reviewed Home program Single arm and bilateral arm  pec stretch in doorway, LTR with arms outstretched  ASSESSMENT:  CLINICAL IMPRESSION: Pt is making gradual gains with softening of tissue in left axilla and radiation quadrant including breast and strength of shoulders.  Needs continued PT for more improvement    OBJECTIVE IMPAIRMENTS decreased ROM, decreased strength, increased edema, increased fascial restrictions, impaired UE functional use, and pain.   ACTIVITY LIMITATIONS sleeping, reach over head, and fatigues with drying hair, folding laundry etc  PARTICIPATION LIMITATIONS: cleaning and laundry  PERSONAL FACTORS 1-2 comorbidities: bilateral breast Cancer s/p radiation and SLNB  are also affecting patient's functional outcome.   REHAB POTENTIAL: Good  CLINICAL DECISION MAKING: Stable/uncomplicated  EVALUATION COMPLEXITY: Low  GOALS: Goals reviewed with patient? Yes  SHORT TERM GOALS: Target date: 01/09/2022    Pt will be independent with HEP for stretching and strengthening bilateral UE's Baseline: Goal status: MET 12/15/2021  2.  Pt will report decreased pain/tightness by 30% Baseline:  Goal status: INITIAL    LONG TERM GOALS: Target date: 01/30/2022    Pts pain will be decreased by 50% or  greater Baseline:  Goal status: INITIAL  2.  Pt will be independent with proper technique for left breast MLD Baseline:  Goal status: INITIAL  3.  Pt will be able to use arm for drying hair and folding clothes with 0- minimal fatigue Baseline:  Goal status: INITIAL    PLAN: PT FREQUENCY: 2x/week  PT DURATION: 6 weeks  PLANNED INTERVENTIONS: Therapeutic exercises, Patient/Family education, Joint mobilization, Manual lymph drainage, and Manual therapy  PLAN FOR NEXT SESSION:Review and advance HEP,  progress  UE strength,STM bilateral greatest on left to pecs, lats, UT, PROM, Review MLD to left breast area to left inguinal only, progress strength to assist with arm fatigue  Helene Kelp K. Owens Shark PT  Norwood Levo, PT 12/19/2021, 2:56 PM

## 2021-12-21 DIAGNOSIS — M85851 Other specified disorders of bone density and structure, right thigh: Secondary | ICD-10-CM | POA: Diagnosis not present

## 2021-12-21 DIAGNOSIS — M810 Age-related osteoporosis without current pathological fracture: Secondary | ICD-10-CM | POA: Diagnosis not present

## 2021-12-21 DIAGNOSIS — Z780 Asymptomatic menopausal state: Secondary | ICD-10-CM | POA: Diagnosis not present

## 2021-12-21 DIAGNOSIS — M85852 Other specified disorders of bone density and structure, left thigh: Secondary | ICD-10-CM | POA: Diagnosis not present

## 2021-12-22 ENCOUNTER — Encounter: Payer: Self-pay | Admitting: Physical Therapy

## 2021-12-22 ENCOUNTER — Ambulatory Visit: Payer: Medicare PPO | Admitting: Physical Therapy

## 2021-12-22 ENCOUNTER — Encounter: Payer: Self-pay | Admitting: Hematology and Oncology

## 2021-12-22 DIAGNOSIS — C50411 Malignant neoplasm of upper-outer quadrant of right female breast: Secondary | ICD-10-CM

## 2021-12-22 DIAGNOSIS — R293 Abnormal posture: Secondary | ICD-10-CM

## 2021-12-22 DIAGNOSIS — C50412 Malignant neoplasm of upper-outer quadrant of left female breast: Secondary | ICD-10-CM

## 2021-12-22 DIAGNOSIS — R609 Edema, unspecified: Secondary | ICD-10-CM | POA: Diagnosis not present

## 2021-12-22 DIAGNOSIS — Z483 Aftercare following surgery for neoplasm: Secondary | ICD-10-CM

## 2021-12-22 NOTE — Therapy (Signed)
OUTPATIENT PHYSICAL THERAPY ONCOLOGY TREATMENT  Patient Name: April [name] MRN: [id number] DOB:May 02, 1951, 71 y.o., female Today's Date: 12/22/2021   PT End of Session - 12/22/21 1105     Visit Number 7    Number of Visits 12    Date for PT Re-Evaluation 01/09/22    Authorization Type [employer]    Authorization Time Period 6/12-7/24/2023    Authorization - Visit Number 7    Authorization - Number of Visits 12    Progress Note Due on Visit 12    PT Start Time 1105    PT Stop Time 1157    PT Time Calculation (min) 52 min    Activity Tolerance Patient tolerated treatment well    Behavior During Therapy [facility] for tasks assessed/performed              Past Medical History:  Diagnosis Date   Breast cancer ([place]) 05/2017   right breast   Breast cancer ([place]) 06/2020   left breast IDC   Eczema    Family history of breast cancer    Family history of ovarian cancer    History of radiation therapy 10/17/17- 11/14/17   40.05 directed to the right breast in 15 fractions, followed by a boost of 10 gy given in 5 fractions.    Hyperlipidemia    Hypertension    toxemia during pregnancy, no meds now   Past Surgical History:  Procedure Laterality Date   ABDOMINAL HYSTERECTOMY     BREAST LUMPECTOMY WITH RADIOACTIVE SEED AND SENTINEL LYMPH NODE BIOPSY Right 07/17/2017   Procedure: BREAST LUMPECTOMY WITH RADIOACTIVE SEED AND SENTINEL LYMPH NODE BIOPSY;  Surgeon: Rolm Bookbinder, MD;  Location: [place];  Service: General;  Laterality: Right;   BREAST LUMPECTOMY WITH RADIOACTIVE SEED AND SENTINEL LYMPH NODE BIOPSY Left 07/13/2020   Procedure: LEFT BREAST LUMPECTOMY WITH RADIOACTIVE SEED AND LEFT AXILLARY SENTINEL LYMPH NODE BIOPSY;  Surgeon: Rolm Bookbinder, MD;  Location: [place];  Service: General;  Laterality: Left;   CESAREAN SECTION     x4   DILATION AND CURETTAGE OF UTERUS     KNEE SURGERY Right    PORT-A-CATH REMOVAL Right 09/04/2018    Procedure: REMOVAL PORT-A-CATH;  Surgeon: Rolm Bookbinder, MD;  Location: [place];  Service: General;  Laterality: Right;   PORT-A-CATH REMOVAL Right 09/05/2021   Procedure: REMOVAL PORT-A-CATH;  Surgeon: Rolm Bookbinder, MD;  Location: [place];  Service: General;  Laterality: Right;   PORTACATH PLACEMENT N/A 07/17/2017   Procedure: INSERTION PORT-A-CATH WITH [place];  Surgeon: Rolm Bookbinder, MD;  Location: [place];  Service: General;  Laterality: N/A;   PORTACATH PLACEMENT Right 07/13/2020   Procedure: INSERTION PORT-A-CATH WITH ULTRASOUND GUIDANCE;  Surgeon: Rolm Bookbinder, MD;  Location: [name];  Service: General;  Laterality: Right;   Patient Active Problem List   Diagnosis Date Noted   Age-related osteoporosis without current pathological fracture 09/08/2020   Allergic rhinitis 09/08/2020   Palpitations 09/08/2020   Personal history of malignant neoplasm of breast 09/08/2020   Personal history of transient ischemic attack (TIA), and cerebral infarction without residual deficits 09/08/2020   Pure hypercholesterolemia 09/08/2020   Vitamin D deficiency 09/08/2020   Neuropathy due to chemotherapeutic drug ([place]) 07/23/2018   Port-A-Cath in place 08/28/2017   TIA (transient ischemic attack) 08/09/2017   Hypertension    Genetic testing 07/06/2017   Family history of ovarian cancer    Family history of breast cancer  Malignant neoplasm of upper-outer quadrant of right breast in female, estrogen receptor negative ([place]) 06/26/2017   Malignant neoplasm of left breast, estrogen receptor positive ([place]) 05/19/2017    PCP: Kelton Pillar, MD  REFERRING PROVIDER: Wilber Bihari, NP  REFERRING DIAG: S/p Breast Cancer bilaterally  THERAPY DIAG:  Aftercare following surgery for neoplasm  Edema, unspecified type  Abnormal posture  Malignant neoplasm of upper-outer quadrant of right female breast, unspecified  estrogen receptor status ([place])  Malignant neoplasm of upper-outer quadrant of left female breast, unspecified estrogen receptor status ([place])  ONSET DATE: 09/2021  Rationale for Evaluation and Treatment Rehabilitation  SUBJECTIVE   My breast was really thick and tight feeling but it has softened up quite a bit. I have not been having the pain under my armpit. I have been doing my exercises. I can go all the way back the dowel.    PERTINENT HISTORY:  Pt with prior history of right breast cancer  with surgery on 07/17/17 for right lumpectomy and SLNB with 5 LN removed.  Had chemo and radiation. Most recent surgery1/25/22 for left breast lumpectomy with 0/5 positive nodes. She had chemo, radiation and infusions for immunotherapy and completed all in February 2023   PAIN:  Are you having pain? No n PRECAUTIONS: Other: lymphedema risk  WEIGHT BEARING RESTRICTIONS No  FALLS:  Has patient fallen in last 6 months? No  LIVING ENVIRONMENT: Lives with: lives with their spouse Lives in: House/apartment Stairs: Yes; Internal: 14 steps; on right going up, and half way up on the left Has following equipment at home: None  OCCUPATION: retired  LEISURE: attending grandsons games,walking some but not regularly  HAND DOMINANCE : right   PRIOR LEVEL OF FUNCTION: Independent  PATIENT GOALS Relieve soreness in armpit area,check breast for lymphedema   OBJECTIVE  COGNITION:  Overall cognitive status: Within functional limits for tasks assessed   PALPATION: Tenderness axillary border of left pectorals,serratus/lats. mild thickening/fibrosis noted left breast proximal to nipple  OBSERVATIONS / OTHER ASSESSMENTS: left nipple appears slightly flatter,  SENSATION:  Light touch: Deficits left breast at areola incision is decreased, other areas are fine    POSTURE: forward head, round shoulders  UPPER EXTREMITY AROM/PROM:  A/PROM RIGHT   eval  R 12/22/21  Shoulder extension 40 58  Shoulder  flexion 148 referred mild pain into mid delt 170  Shoulder abduction 164 175  Shoulder internal rotation 75   Shoulder external rotation 86     (Blank rows = not tested)  A/PROM LEFT   eval R 12/22/21  Shoulder extension 53 55  Shoulder flexion 150 169  Shoulder abduction 165 179  Shoulder internal rotation 70    Shoulder external rotation 80 pain in left shoulder     (Blank rows = not tested)   CERVICAL AROM: All within functional limits:      UPPER EXTREMITY STRENGTH: WNL   LYMPHEDEMA ASSESSMENTS:   SURGERY TYPE/DATE: Left lumpectomy 07/13/2020, Right lumpectomy 07/17/2017  NUMBER OF LYMPH NODES REMOVED: 5 right, 5 left  CHEMOTHERAPY: yes  RADIATION:yes  HORMONE TREATMENT: yes  INFECTIONS:   LYMPHEDEMA ASSESSMENTS:   LANDMARK RIGHT  12/05/2021  10 cm proximal to olecranon process 29.2  Olecranon process 24.6  10 cm proximal to ulnar styloid process 18.9  Just proximal to ulnar styloid process 14.3  Across hand at thumb web space 18.1  At base of 2nd digit 5.7  (Blank rows = not tested)  LANDMARK LEFT  12/05/2021  10 cm proximal to olecranon  process 29.05  Olecranon process 24.2  10 cm proximal to ulnar styloid process 18.3  Just proximal to ulnar styloid process 13.9  Across hand at thumb web space 17.3  At base of 2nd digit 5.45  (Blank rows = not tested)      QUICK DASH SURVEY: 16%   TODAY'S TREATMENT   12/22/21 12/14/2021 Soft tissue mobilization to bilateral UT, pectorals, serratus Lats in supine and SL to scapular area with coco butter. Increased tightness noted initially but improved with treatment. MLD as follows in supine: short neck, 5 diaphragmatic breaths, L inguinal nodes and establishment of axillo inguinal pathway then L breast moving fluid towards pathway and retracing steps  12/19/2021: perfromed ROM to neck, shouders, scapula and upper thoracic spine Pt with tightness with trunk rotation.  Manual lymph drainage then soft tissue work  with cocoa butter to left anterior and posterior shoulder, axilla and lateral trunk  in supine and right sidelying with left arm overhead for lateral trunk stretch. Pt instructed to "pin" left axillary area with right hand while she does circles with left arm overhead for AROM and gently muscle contractions. Finished up with soft tissue work to left pec muscle. Pt encouraged to return to yoga and even possibly aqua exercises offerred throught [employer] to continue with flexibility exericises and improve fitness  12/14/2021 Soft tissue mobilization to bilateral UT, pectorals, serratus Lats in supine and SL to scapular area with coco butter. PROM bilateral shoulder flexion, scaption, abduction with MFR to axilla. Bilateral AROM flexion, scaption, abduction Supine horizontal abduction yellow x10, sword bilaterally x 5 Standing bilateral ER x 5 with yellow, biceps curls yellow 2 x 5 Standing bilateral shoulder flexion 2 x 5, scaption 1# 2 x 5  12/12/2021 Soft tissue mobilization to bilateral UT, pectorals, serratus Lats in supine and SL to scapular area with coco butter. PROM bilateral shoulder flexion, scaption, abduction with MFR to axilla. Supine over lengthwise rolled towel holding ppt; shoulder flexion, scaption, horizontal abduction x5 Redid SOZO and changed to unilateral    11/28/2021 Exercises - Supine Lower Trunk Rotation  - 2 x daily - 7 x weekly - 1 sets - 3 reps - 10 hold - Doorway Pec Stretch at 90 Degrees Abduction  - 1 x daily - 7 x weekly - 1 sets - 3 reps - 10-15 hold - Single Arm Doorway Pec Stretch at 90 Degrees Abduction  - 1 x daily - 7 x weekly - 3 sets - 10 reps - 10-15 hold Tied standing lat stretch but did not feel so not added to HEP PATIENT EDUCATION:  Education details: Access Code: [email or web address] URL: [email or web address] Date: 11/28/2021 Prepared by: Cheral Almas  Exercises - Supine Lower Trunk Rotation  - 2 x daily - 7 x weekly - 1 sets - 3 reps -  10 hold - Doorway Pec Stretch at 90 Degrees Abduction  - 1 x daily - 7 x weekly - 1 sets - 3 reps - 10-15 hold - Single Arm Doorway Pec Stretch at 90 Degrees Abduction  - 1 x daily - 7 x weekly - 3 sets - 10 reps - 10-15 hold  Person educated: Patient Education method: Customer service manager, handouts Education comprehension: verbalized understanding and returned demonstration   HOME EXERCISE PROGRAM: Reviewed Home program Single arm and bilateral arm pec stretch in doorway, LTR with arms outstretched  ASSESSMENT:  CLINICAL IMPRESSION: Remeasured pt's shoulder ROM and it has improved greatly today from eval. Pt is feeling less discomfort. She  still has discomfort in lateral breast so continued with MLD to this area. Continued STM to bilateral posterior scapular muscles, UT, lats and serratus to decrease muscle tightness.    OBJECTIVE IMPAIRMENTS decreased ROM, decreased strength, increased edema, increased fascial restrictions, impaired UE functional use, and pain.   ACTIVITY LIMITATIONS sleeping, reach over head, and fatigues with drying hair, folding laundry etc  PARTICIPATION LIMITATIONS: cleaning and laundry  PERSONAL FACTORS 1-2 comorbidities: bilateral breast Cancer s/p radiation and SLNB  are also affecting patient's functional outcome.   REHAB POTENTIAL: Good  CLINICAL DECISION MAKING: Stable/uncomplicated  EVALUATION COMPLEXITY: Low  GOALS: Goals reviewed with patient? Yes  SHORT TERM GOALS: Target date: 01/12/2022    Pt will be independent with HEP for stretching and strengthening bilateral UE's Baseline: Goal status: MET 12/15/2021  2.  Pt will report decreased pain/tightness by 30% Baseline:  Goal status: INITIAL    LONG TERM GOALS: Target date: 02/02/2022    Pts pain will be decreased by 50% or greater Baseline:  Goal status: INITIAL  2.  Pt will be independent with proper technique for left breast MLD Baseline:  Goal status: INITIAL  3.  Pt  will be able to use arm for drying hair and folding clothes with 0- minimal fatigue Baseline:  Goal status: INITIAL    PLAN: PT FREQUENCY: 2x/week  PT DURATION: 6 weeks  PLANNED INTERVENTIONS: Therapeutic exercises, Patient/Family education, Joint mobilization, Manual lymph drainage, and Manual therapy  PLAN FOR NEXT SESSION:Review and advance HEP,  progress  UE strength,STM bilateral greatest on left to pecs, lats, UT, PROM, Review MLD to left breast area to left inguinal only, progress strength to assist with arm fatigue  [employer], PT 12/22/2021, 12:01 PM

## 2021-12-23 ENCOUNTER — Telehealth: Payer: Self-pay

## 2021-12-23 NOTE — Telephone Encounter (Signed)
Pt called and voiced concern regarding bone density results. Pt reports her PCP called and would like for her to have Reclast; however, she states she is reluctant to have this infusion and would prefer to speak with Wilber Bihari, NP in detail regarding this. Pt would also like to discuss continuing Anastrozole and if she truly needs to take this medication. Advised pt we would retrieve results from [place] would review this with her at her upcoming visit 12/29/21. Pt verbalized thanks and understanding.

## 2021-12-26 ENCOUNTER — Ambulatory Visit: Payer: Medicare PPO

## 2021-12-27 ENCOUNTER — Encounter: Payer: Self-pay | Admitting: Radiology

## 2021-12-28 ENCOUNTER — Encounter: Payer: Self-pay | Admitting: *Deleted

## 2021-12-28 ENCOUNTER — Ambulatory Visit: Payer: Medicare PPO

## 2021-12-28 DIAGNOSIS — C50411 Malignant neoplasm of upper-outer quadrant of right female breast: Secondary | ICD-10-CM

## 2021-12-28 NOTE — Progress Notes (Unsigned)
SURVIVORSHIP VISIT:  BRIEF ONCOLOGIC HISTORY:  Oncology History  Malignant neoplasm of upper-outer quadrant of right breast in female, estrogen receptor negative ([place])  06/20/2017 Initial Diagnosis    [place] woman status post right breast upper outer quadrant biopsy 06/20/2017 for a clinical T1b N0, stage IA invasive ductal carcinoma, grade 3, estrogen and progesterone receptor negative, but HER-2 amplified, with an MIB-1 of 30%                  07/05/2017 Genetic Testing   The patient had genetic testing due to a personal history of breast cancer and family history of breast and ovarian cancer.  The Common Hereditary Cancer Panel was ordered. The Common Hereditary Cancer Panel offered by [facility] includes sequencing and/or deletion duplication testing of the following 47 genes: APC, ATM, AXIN2, BARD1, BMPR1A, BRCA1, BRCA2, BRIP1, CDH1, CDKN2A (p14ARF), CDKN2A (p16INK4a), CKD4, CHEK2, CTNNA1, DICER1, EPCAM (Deletion/duplication testing only), GREM1 (promoter region deletion/duplication testing only), KIT, MEN1, MLH1, MSH2, MSH3, MSH6, MUTYH, NBN, NF1, NHTL1, PALB2, PDGFRA, PMS2, POLD1, POLE, PTEN, RAD50, RAD51C, RAD51D, SDHB, SDHC, SDHD, SMAD4, SMARCA4. STK11, TP53, TSC1, TSC2, and VHL.  The following genes were evaluated for sequence changes only: SDHA and HOXB13 c.251G>A variant only.  Results: No pathogenic variants identified.  A Variant of uncertain significance in the gene MSH2 was identified c.1331G>T (p.Arg444Leu).  The date of this test report is 07/05/2017.  UPDATE:MSH2 c.1331G>T (p.Arg444Leu) has been reclassified to Benign.  The amended report date is December 27, 2020.   07/17/2017 Surgery   status post right lumpectomy and sentinel lymph node sampling for a pT1c pN0, stage IA invasive ductal carcinoma, grade 2, with negative margins.  A total of 5 lymph nodes were removed    08/07/2017 - 07/23/2018 Adjuvant Chemotherapy   adjuvant chemotherapy consisting of paclitaxel weekly x12  together with trastuzumab every 21 days   10/17/2017 - 11/14/2017 Radiation Therapy   adjuvant radiation: 40.05 Gy directed to the Right Breast in 15 fractions, followed by a boost of 10 Gy given in 5 fractions    07/27/2020 - 12/30/2020 Chemotherapy   adjuvant chemotherapy and anti-HER2 immunotherapy started 07/27/2020, consisting of CMF chemotherapy to be repeated every 21 days x 8, completed 12/28/2020     07/27/2020 - 08/02/2021 Chemotherapy   Patient is on Treatment Plan : BREAST Trastuzumab q21d     02/2021 -  Anti-estrogen oral therapy   Anastrozole daily X 5 years   Malignant neoplasm of left breast, estrogen receptor positive ([place])  05/19/2017 Initial Diagnosis   Malignant neoplasm of left breast, estrogen receptor negative ([place])   06/15/2020 Relapse/Recurrence   left breast upper inner quadrant biopsy 06/15/2020 for a clinical T1 N0 invasive ductal carcinoma, grade 3, weakly estrogen receptor positive, progesterone receptor negative, HER2 amplified, with an MIB-1 of 45%   07/13/2020 Cancer Staging   Staging form: Breast, AJCC 8th Edition - Pathologic stage from 07/13/2020: Stage IA (pT1c, pN0, cM0, G3, ER+, PR-, HER2+) - Signed by Gardenia Phlegm, NP on 07/28/2020 Stage prefix: Initial diagnosis Histologic grading system: 3 grade system   07/13/2020 Surgery   left lumpectomy and sentinel lymph node sampling 07/13/2020 for a pT1c pN0, stage IB invasive ductal carcinoma, grade 3, with negative margins, 5 axilla lymph nodes were removed, all benign, epeat prognostic panel again weakly estrogen receptor positive, progesterone receptor negative, now HER-2 not amplified   09/09/2020 - 10/20/2020 Radiation Therapy   Adjuvant radiation     INTERVAL HISTORY:  Ms. Annunziato to review  her survivorship care plan detailing her treatment course for breast cancer, as well as monitoring long-term side effects of that treatment, education regarding health maintenance, screening, and overall  wellness and health promotion.     Overall, Ms. Loveland reports feeling quite well.  She is taking anastrozole daily.  She is concerned about developing Alzheimer's with Anastrozole.  She underwent bone density testing 12/21/2021 and it showed osteoporosis with a t score of -2.5.  She does not want to take Reclast and wants to know if she can try alternative modalities for her bones.    REVIEW OF SYSTEMS:  Review of Systems  Constitutional:  Negative for appetite change, chills, fatigue, fever and unexpected weight change.  HENT:   Negative for hearing loss, lump/mass and trouble swallowing.   Eyes:  Negative for eye problems and icterus.  Respiratory:  Negative for chest tightness, cough and shortness of breath.   Cardiovascular:  Negative for chest pain, leg swelling and palpitations.  Gastrointestinal:  Negative for abdominal distention, abdominal pain, constipation, diarrhea, nausea and vomiting.  Endocrine: Negative for hot flashes.  Genitourinary:  Negative for difficulty urinating.   Musculoskeletal:  Negative for arthralgias.  Skin:  Negative for itching and rash.  Neurological:  Negative for dizziness, extremity weakness, headaches and numbness.  Hematological:  Negative for adenopathy. Does not bruise/bleed easily.  Psychiatric/Behavioral:  Negative for depression. The patient is not nervous/anxious.   Breast: Denies any new nodularity, masses, tenderness, nipple changes, or nipple discharge.    ONCOLOGY TREATMENT TEAM:  1. Surgeon:  Dr. Donne Hazel at [facility] 2. Medical Oncologist: Dr. Lindi Adie  3. Radiation Oncologist: Dr. Isidore Moos    PAST MEDICAL/SURGICAL HISTORY:  Past Medical History:  Diagnosis Date   Breast cancer ([place]) 05/2017   right breast   Breast cancer ([place]) 06/2020   left breast IDC   Eczema    Family history of breast cancer    Family history of ovarian cancer    History of radiation therapy 10/17/17- 11/14/17   40.05 directed to the right  breast in 15 fractions, followed by a boost of 10 gy given in 5 fractions.    Hyperlipidemia    Hypertension    toxemia during pregnancy, no meds now   Past Surgical History:  Procedure Laterality Date   ABDOMINAL HYSTERECTOMY     BREAST LUMPECTOMY WITH RADIOACTIVE SEED AND SENTINEL LYMPH NODE BIOPSY Right 07/17/2017   Procedure: BREAST LUMPECTOMY WITH RADIOACTIVE SEED AND SENTINEL LYMPH NODE BIOPSY;  Surgeon: Rolm Bookbinder, MD;  Location: [place];  Service: General;  Laterality: Right;   BREAST LUMPECTOMY WITH RADIOACTIVE SEED AND SENTINEL LYMPH NODE BIOPSY Left 07/13/2020   Procedure: LEFT BREAST LUMPECTOMY WITH RADIOACTIVE SEED AND LEFT AXILLARY SENTINEL LYMPH NODE BIOPSY;  Surgeon: Rolm Bookbinder, MD;  Location: [place];  Service: General;  Laterality: Left;   CESAREAN SECTION     x4   DILATION AND CURETTAGE OF UTERUS     KNEE SURGERY Right    PORT-A-CATH REMOVAL Right 09/04/2018   Procedure: REMOVAL PORT-A-CATH;  Surgeon: Rolm Bookbinder, MD;  Location: [place];  Service: General;  Laterality: Right;   PORT-A-CATH REMOVAL Right 09/05/2021   Procedure: REMOVAL PORT-A-CATH;  Surgeon: Rolm Bookbinder, MD;  Location: [place];  Service: General;  Laterality: Right;   PORTACATH PLACEMENT N/A 07/17/2017   Procedure: INSERTION PORT-A-CATH WITH [place];  Surgeon: Rolm Bookbinder, MD;  Location: [place];  Service: General;  Laterality: N/A;  PORTACATH PLACEMENT Right 07/13/2020   Procedure: INSERTION PORT-A-CATH WITH ULTRASOUND GUIDANCE;  Surgeon: Rolm Bookbinder, MD;  Location: [place];  Service: General;  Laterality: Right;     ALLERGIES:  Allergies  Allergen Reactions   Tape Itching and Rash   Betamethasone Dipropionate Other (See Comments)   Caffeine Other (See Comments)    Palpitations    Cetirizine Hcl Other (See Comments)   Fosamax [Alendronate] Other (See  Comments)    GERD   Lisinopril Other (See Comments)   Loratadine Other (See Comments)   Nsaids     Stomach issue   Other Other (See Comments)   Statins Other (See Comments)   Sulfa Antibiotics Rash     CURRENT MEDICATIONS:  Outpatient Encounter Medications as of 12/29/2021  Medication Sig   anastrozole (ARIMIDEX) 1 MG tablet Take 1 tablet (1 mg total) by mouth daily.   aspirin 81 MG chewable tablet Chew 81 mg by mouth daily.   Calcium-Vitamin D-Vitamin K (CALCIUM + D) [phone] MG-UNT-MCG CHEW    co-enzyme Q-10 30 MG capsule Take 30 mg by mouth daily.   hydrochlorothiazide (MICROZIDE) 12.5 MG capsule TAKE 1 CAPSULE BY MOUTH EVERY DAY (Patient not taking: Reported on 11/28/2021)   Omega-3 Fatty Acids (FISH OIL) 1000 MG CAPS Take 1 capsule by mouth daily.   Omega-3 Fatty Acids (FISH OIL) 1000 MG CAPS 1 capsule (Patient not taking: Reported on 11/28/2021)   Pyridoxine HCl (VITAMIN B6) 250 MG TABS 1 tablet   Red Yeast Rice Extract (RED YEAST RICE PO) Take by mouth.   vitamin B-12 (CYANOCOBALAMIN) 1000 MCG tablet Take 1,000 mcg by mouth daily.   vitamin C (ASCORBIC ACID) 250 MG tablet Take 250 mg by mouth daily.   No facility-administered encounter medications on file as of 12/29/2021.     ONCOLOGIC FAMILY HISTORY:  Family History  Problem Relation Age of Onset   Breast cancer Mother 67       again at 68 in other breast    Heart attack Father 105   Breast cancer Sister 19   Breast cancer Maternal Grandmother 32       spread to lungs, died at 43   Ovarian cancer Cousin 30   Prostate cancer Cousin      GENETIC COUNSELING/TESTING: neg  SOCIAL HISTORY:  Social History   Socioeconomic History   Marital status: Married    Spouse name: Not on file   Number of children: Not on file   Years of education: Not on file   Highest education level: Not on file  Occupational History   Not on file  Tobacco Use   Smoking status: Never   Smokeless tobacco: Never  Vaping Use    Vaping Use: Never used  Substance and Sexual Activity   Alcohol use: Yes    Comment: social   Drug use: No   Sexual activity: Not Currently    Birth control/protection: Surgical  Other Topics Concern   Not on file  Social History Narrative   Not on file   Social Determinants of Health   Financial Resource Strain: Not on file  Food Insecurity: Not on file  Transportation Needs: Not on file  Physical Activity: Not on file  Stress: Not on file  Social Connections: Not on file  Intimate Partner Violence: Not on file     OBSERVATIONS/OBJECTIVE:  BP (!) 148/69 (BP Location: Right Arm, Patient Position: Sitting)   Pulse 66   Temp 97.7 F (36.5 C) (Tympanic)  Resp 18   Ht 5' 3.5" (1.613 m)   Wt 154 lb 9.6 oz (70.1 kg)   SpO2 100%   BMI 26.96 kg/m  GENERAL: Patient is a well appearing female in no acute distress HEENT:  Sclerae anicteric.  Oropharynx clear and moist. No ulcerations or evidence of oropharyngeal candidiasis. Neck is supple.  NODES:  No cervical, supraclavicular, or axillary lymphadenopathy palpated.  BREAST EXAM: Bilateral breast status postlumpectomy and radiation no sign of local recurrence LUNGS:  Clear to auscultation bilaterally.  No wheezes or rhonchi. HEART:  Regular rate and rhythm. No murmur appreciated. ABDOMEN:  Soft, nontender.  Positive, normoactive bowel sounds. No organomegaly palpated. MSK:  No focal spinal tenderness to palpation. Full range of motion bilaterally in the upper extremities. EXTREMITIES:  No peripheral edema.   SKIN:  Clear with no obvious rashes or skin changes. No nail dyscrasia. NEURO:  Nonfocal. Well oriented.  Appropriate affect.   LABORATORY DATA:  None for this visit.  DIAGNOSTIC IMAGING:  None for this visit.      ASSESSMENT AND PLAN:  Ms.. Porcelli is a pleasant 71 y.o. female with Stage IA left breast invasive ductal carcinoma, ER+/PR-/HER2+, diagnosed in 06/2020, treated with lumpectomy, adjuvant chemotherapy,  maintenance Trastuzumab, adjuvant radiation therapy, and anti-estrogen therapy with Anastrozole beginning in 02/2021.  She presents to the Survivorship Clinic for our initial meeting and routine follow-up post-completion of treatment for breast cancer.    1. Stage IA left breast cancer:  Ms. Moroz is continuing to recover from definitive treatment for breast cancer. She will follow-up with her medical oncologist, Dr. Lindi Adie with history and physical exam per surveillance protocol.  She will continue her anti-estrogen therapy with Anastrozole. Thus far, she is tolerating the Anastrozole well, with minimal side effects. She was instructed to make Dr. Lindi Adie or myself aware if she begins to experience any worsening side effects of the medication and I could see her back in clinic to help manage those side effects, as needed. Her mammogram is due 06/2022; orders placed today. Today, a comprehensive survivorship care plan and treatment summary was reviewed with the patient today detailing her breast cancer diagnosis, treatment course, potential late/long-term effects of treatment, appropriate follow-up care with recommendations for the future, and patient education resources.  A copy of this summary, along with a letter will be sent to the patient's primary care provider via mail/fax/In Basket message after today's visit.    2. Bone health:  Given Ms. Daniely's age/history of breast cancer and her current treatment regimen including anti-estrogen therapy with Anastrozole, she is at risk for bone demineralization.  Her last DEXA scan was 12/21/2021, which showed osteoporosis with a T score of -2.5 in the spine.  She does not want to receive reclast and has opted to optimize calcium, vitamin d intake and weight bearing exercises. We will try this and she will repeat bone density testing in 2 years.  She understands that due to her osteoprosis she is at increased risk for falls. She was given education on specific  activities to promote bone health.  3. Cancer screening:  Due to Ms. Lacivita's history and her age, she should receive screening for skin cancers, colon cancer.  The information and recommendations are listed on the patient's comprehensive care plan/treatment summary and were reviewed in detail with the patient.    4. Health maintenance and wellness promotion: Ms. Mckown was encouraged to consume 5-7 servings of fruits and vegetables per day. We reviewed the "Nutrition Rainbow" handout.  She was also encouraged to engage in moderate to vigorous exercise for 30 minutes per day most days of the week. We discussed the [employer] fitness program, which is designed for cancer survivors to help them become more physically fit after cancer treatments.  She was instructed to limit her alcohol consumption and continue to abstain from tobacco use.     5. Support services/counseling: It is not uncommon for this period of the patient's cancer care trajectory to be one of many emotions and stressors.  She was given information regarding our available services and encouraged to contact me with any questions or for help enrolling in any of our support group/programs.    Follow up instructions:    -Return to cancer center in 6 months  -Mammogram due in 06/2022 -Bone Density testing in 12/2023 -Follow up with surgery 1 year -She is welcome to return back to the Survivorship Clinic at any time; no additional follow-up needed at this time.  -Consider referral back to survivorship as a long-term survivor for continued surveillance  The patient was provided an opportunity to ask questions and all were answered. The patient agreed with the plan and demonstrated an understanding of the instructions.   Total encounter time:45 minutes*in face-to-face visit time, chart review, lab review, care coordination, order entry, and documentation of the encounter time.  Wilber Bihari, NP 12/29/21 9:03 AM Medical  Oncology and Hematology [facility] [place], [place] [address] Tel. [phone]    Fax. [phone]  *Total Encounter Time as defined by the Centers for Medicare and Medicaid Services includes, in addition to the face-to-face time of a patient visit (documented in the note above) non-face-to-face time: obtaining and reviewing outside history, ordering and reviewing medications, tests or procedures, care coordination (communications with other health care professionals or caregivers) and documentation in the medical record.

## 2021-12-29 ENCOUNTER — Encounter: Payer: Self-pay | Admitting: Adult Health

## 2021-12-29 ENCOUNTER — Inpatient Hospital Stay: Payer: Medicare PPO | Attending: Adult Health | Admitting: Adult Health

## 2021-12-29 ENCOUNTER — Encounter: Payer: Self-pay | Admitting: Hematology and Oncology

## 2021-12-29 ENCOUNTER — Other Ambulatory Visit: Payer: Self-pay

## 2021-12-29 VITALS — BP 148/69 | HR 66 | Temp 97.7°F | Resp 18 | Ht 63.5 in | Wt 154.6 lb

## 2021-12-29 DIAGNOSIS — Z882 Allergy status to sulfonamides status: Secondary | ICD-10-CM | POA: Diagnosis not present

## 2021-12-29 DIAGNOSIS — Z171 Estrogen receptor negative status [ER-]: Secondary | ICD-10-CM

## 2021-12-29 DIAGNOSIS — Z923 Personal history of irradiation: Secondary | ICD-10-CM | POA: Diagnosis not present

## 2021-12-29 DIAGNOSIS — Z803 Family history of malignant neoplasm of breast: Secondary | ICD-10-CM | POA: Insufficient documentation

## 2021-12-29 DIAGNOSIS — Z8249 Family history of ischemic heart disease and other diseases of the circulatory system: Secondary | ICD-10-CM | POA: Insufficient documentation

## 2021-12-29 DIAGNOSIS — C50411 Malignant neoplasm of upper-outer quadrant of right female breast: Secondary | ICD-10-CM

## 2021-12-29 DIAGNOSIS — Z13820 Encounter for screening for osteoporosis: Secondary | ICD-10-CM | POA: Diagnosis not present

## 2021-12-29 DIAGNOSIS — Z886 Allergy status to analgesic agent status: Secondary | ICD-10-CM | POA: Diagnosis not present

## 2021-12-29 DIAGNOSIS — I10 Essential (primary) hypertension: Secondary | ICD-10-CM | POA: Insufficient documentation

## 2021-12-29 DIAGNOSIS — Z8041 Family history of malignant neoplasm of ovary: Secondary | ICD-10-CM | POA: Insufficient documentation

## 2021-12-29 DIAGNOSIS — Z8042 Family history of malignant neoplasm of prostate: Secondary | ICD-10-CM | POA: Diagnosis not present

## 2021-12-29 DIAGNOSIS — Z888 Allergy status to other drugs, medicaments and biological substances status: Secondary | ICD-10-CM | POA: Insufficient documentation

## 2021-12-29 DIAGNOSIS — C50012 Malignant neoplasm of nipple and areola, left female breast: Secondary | ICD-10-CM | POA: Diagnosis not present

## 2021-12-29 DIAGNOSIS — Z79899 Other long term (current) drug therapy: Secondary | ICD-10-CM | POA: Insufficient documentation

## 2021-12-29 DIAGNOSIS — Z79811 Long term (current) use of aromatase inhibitors: Secondary | ICD-10-CM | POA: Diagnosis not present

## 2021-12-29 DIAGNOSIS — C50212 Malignant neoplasm of upper-inner quadrant of left female breast: Secondary | ICD-10-CM | POA: Diagnosis not present

## 2021-12-29 DIAGNOSIS — Z170 Estrogen receptor positive status [ER+]: Secondary | ICD-10-CM

## 2021-12-29 MED ORDER — VENLAFAXINE HCL ER 37.5 MG PO CP24: 37.5000 mg | ORAL_CAPSULE | Freq: Every day | ORAL | Status: DC

## 2021-12-30 ENCOUNTER — Encounter: Payer: Self-pay | Admitting: *Deleted

## 2021-12-30 ENCOUNTER — Ambulatory Visit: Payer: Medicare PPO

## 2021-12-30 DIAGNOSIS — Z483 Aftercare following surgery for neoplasm: Secondary | ICD-10-CM

## 2021-12-30 DIAGNOSIS — R609 Edema, unspecified: Secondary | ICD-10-CM

## 2021-12-30 DIAGNOSIS — R293 Abnormal posture: Secondary | ICD-10-CM

## 2021-12-30 DIAGNOSIS — C50411 Malignant neoplasm of upper-outer quadrant of right female breast: Secondary | ICD-10-CM | POA: Diagnosis not present

## 2021-12-30 DIAGNOSIS — C50412 Malignant neoplasm of upper-outer quadrant of left female breast: Secondary | ICD-10-CM

## 2021-12-30 NOTE — Addendum Note (Signed)
Addended by: [employer]a [employer] D on: 12/30/2021 09:06 AM   Modules accepted: Orders

## 2021-12-30 NOTE — Progress Notes (Signed)
Received fax from [facility] for colonoscopy report.

## 2021-12-30 NOTE — Patient Instructions (Signed)
Over Head Pull: Narrow and Wide Grip   Cancer Rehab [phone]   On back, knees bent, feet flat, band across thighs, elbows straight but relaxed. Pull hands apart (start). Keeping elbows straight, bring arms up and over head, hands toward floor. Keep pull steady on band. Hold momentarily. Return slowly, keeping pull steady, back to start. Then do same with a wider grip on the band (past shoulder width) Repeat _5-10__ times. Band color __yellow or red____   Side Pull: Double Arm   On back, knees bent, feet flat. Arms perpendicular to body, shoulder level, elbows straight but relaxed. Pull arms out to sides, elbows straight. Resistance band comes across collarbones, hands toward floor. Hold momentarily. Slowly return to starting position. Repeat _5-10__ times. Band color _yellow or red____   Sword   On back, knees bent, feet flat, left hand on left hip, right hand above left. Pull right arm DIAGONALLY (hip to shoulder) across chest. Bring right arm along head toward floor. Hold momentarily. Slowly return to starting position. Repeat _5-10__ times. Do with left arm. Band color _yellow or red_____   Shoulder Rotation: Double Arm   On back, knees bent, feet flat, elbows tucked at sides, bent 90, hands palms up. Pull hands apart and down toward floor, keeping elbows near sides. Hold momentarily. Slowly return to starting position. Repeat _5-10__ times. Band color __yellow or red____

## 2021-12-30 NOTE — Therapy (Signed)
OUTPATIENT PHYSICAL THERAPY ONCOLOGY TREATMENT  Patient Name: April [name] MRN: [id number] DOB:October 15, 1950, 71 y.o., female Today's Date: 12/30/2021   PT End of Session - 12/30/21 1111     Visit Number 8    Number of Visits 12    Date for PT Re-Evaluation 01/09/22    Authorization - Visit Number 8    Authorization - Number of Visits 12    Progress Note Due on Visit 21    PT Start Time 1108    PT Stop Time 1207    PT Time Calculation (min) 59 min    Activity Tolerance Patient tolerated treatment well    Behavior During Therapy [facility] for tasks assessed/performed              Past Medical History:  Diagnosis Date   Breast cancer ([place]) 05/2017   right breast   Breast cancer ([place]) 06/2020   left breast IDC   Eczema    Family history of breast cancer    Family history of ovarian cancer    History of radiation therapy 10/17/17- 11/14/17   40.05 directed to the right breast in 15 fractions, followed by a boost of 10 gy given in 5 fractions.    Hyperlipidemia    Hypertension    toxemia during pregnancy, no meds now   Past Surgical History:  Procedure Laterality Date   ABDOMINAL HYSTERECTOMY     BREAST LUMPECTOMY WITH RADIOACTIVE SEED AND SENTINEL LYMPH NODE BIOPSY Right 07/17/2017   Procedure: BREAST LUMPECTOMY WITH RADIOACTIVE SEED AND SENTINEL LYMPH NODE BIOPSY;  Surgeon: Rolm Bookbinder, MD;  Location: [place];  Service: General;  Laterality: Right;   BREAST LUMPECTOMY WITH RADIOACTIVE SEED AND SENTINEL LYMPH NODE BIOPSY Left 07/13/2020   Procedure: LEFT BREAST LUMPECTOMY WITH RADIOACTIVE SEED AND LEFT AXILLARY SENTINEL LYMPH NODE BIOPSY;  Surgeon: Rolm Bookbinder, MD;  Location: [place];  Service: General;  Laterality: Left;   CESAREAN SECTION     x4   DILATION AND CURETTAGE OF UTERUS     KNEE SURGERY Right    PORT-A-CATH REMOVAL Right 09/04/2018   Procedure: REMOVAL PORT-A-CATH;  Surgeon: Rolm Bookbinder, MD;  Location:  [place];  Service: General;  Laterality: Right;   PORT-A-CATH REMOVAL Right 09/05/2021   Procedure: REMOVAL PORT-A-CATH;  Surgeon: Rolm Bookbinder, MD;  Location: [place];  Service: General;  Laterality: Right;   PORTACATH PLACEMENT N/A 07/17/2017   Procedure: INSERTION PORT-A-CATH WITH [place];  Surgeon: Rolm Bookbinder, MD;  Location: [place];  Service: General;  Laterality: N/A;   PORTACATH PLACEMENT Right 07/13/2020   Procedure: INSERTION PORT-A-CATH WITH ULTRASOUND GUIDANCE;  Surgeon: Rolm Bookbinder, MD;  Location: [place];  Service: General;  Laterality: Right;   Patient Active Problem List   Diagnosis Date Noted   Age-related osteoporosis without current pathological fracture 09/08/2020   Allergic rhinitis 09/08/2020   Palpitations 09/08/2020   Personal history of malignant neoplasm of breast 09/08/2020   Personal history of transient ischemic attack (TIA), and cerebral infarction without residual deficits 09/08/2020   Pure hypercholesterolemia 09/08/2020   Vitamin D deficiency 09/08/2020   Neuropathy due to chemotherapeutic drug ([place]) 07/23/2018   Port-A-Cath in place 08/28/2017   TIA (transient ischemic attack) 08/09/2017   Hypertension    Genetic testing 07/06/2017   Family history of ovarian cancer    Family history of breast cancer    Malignant neoplasm of upper-outer quadrant of right breast in female, estrogen  receptor negative ([place]) 06/26/2017   Malignant neoplasm of left breast, estrogen receptor positive ([place]) 05/19/2017    PCP: Kelton Pillar, MD  REFERRING PROVIDER: Wilber Bihari, NP  REFERRING DIAG: S/p Breast Cancer bilaterally  THERAPY DIAG:  Aftercare following surgery for neoplasm  Edema, unspecified type  Abnormal posture  Malignant neoplasm of upper-outer quadrant of right female breast, unspecified estrogen receptor status ([place])  Malignant neoplasm of upper-outer quadrant  of left female breast, unspecified estrogen receptor status ([place])  ONSET DATE: 09/2021  Rationale for Evaluation and Treatment Rehabilitation  SUBJECTIVE   My breast is getting so much better and the tightness under my arm has really gotten better with the stretches.  I can also tell the fatigue has improved in my arms as I don't have to rest anymore with doing my hair or when I am folding clothes.   PERTINENT HISTORY:  Pt with prior history of right breast cancer  with surgery on 07/17/17 for right lumpectomy and SLNB with 5 LN removed.  Had chemo and radiation. Most recent surgery1/25/22 for left breast lumpectomy with 0/5 positive nodes. She had chemo, radiation and infusions for immunotherapy and completed all in February 2023   PAIN:  Are you having pain? No  PRECAUTIONS: Other: lymphedema risk  WEIGHT BEARING RESTRICTIONS No  FALLS:  Has patient fallen in last 6 months? No  LIVING ENVIRONMENT: Lives with: lives with their spouse Lives in: House/apartment Stairs: Yes; Internal: 14 steps; on right going up, and half way up on the left Has following equipment at home: None  OCCUPATION: retired  LEISURE: attending grandsons games,walking some but not regularly  HAND DOMINANCE : right   PRIOR LEVEL OF FUNCTION: Independent  PATIENT GOALS Relieve soreness in armpit area,check breast for lymphedema   OBJECTIVE  COGNITION:  Overall cognitive status: Within functional limits for tasks assessed   PALPATION: Tenderness axillary border of left pectorals,serratus/lats. mild thickening/fibrosis noted left breast proximal to nipple  OBSERVATIONS / OTHER ASSESSMENTS: left nipple appears slightly flatter,  SENSATION:  Light touch: Deficits left breast at areola incision is decreased, other areas are fine    POSTURE: forward head, round shoulders  UPPER EXTREMITY AROM/PROM:  A/PROM RIGHT   eval  R 12/22/21  Shoulder extension 40 58  Shoulder flexion 148 referred mild pain  into mid delt 170  Shoulder abduction 164 175  Shoulder internal rotation 75   Shoulder external rotation 86     (Blank rows = not tested)  A/PROM LEFT   eval R 12/22/21  Shoulder extension 53 55  Shoulder flexion 150 169  Shoulder abduction 165 179  Shoulder internal rotation 70    Shoulder external rotation 80 pain in left shoulder     (Blank rows = not tested)   CERVICAL AROM: All within functional limits:      UPPER EXTREMITY STRENGTH: WNL   LYMPHEDEMA ASSESSMENTS:   SURGERY TYPE/DATE: Left lumpectomy 07/13/2020, Right lumpectomy 07/17/2017  NUMBER OF LYMPH NODES REMOVED: 5 right, 5 left  CHEMOTHERAPY: yes  RADIATION:yes  HORMONE TREATMENT: yes  INFECTIONS:   LYMPHEDEMA ASSESSMENTS:   LANDMARK RIGHT  12/05/2021  10 cm proximal to olecranon process 29.2  Olecranon process 24.6  10 cm proximal to ulnar styloid process 18.9  Just proximal to ulnar styloid process 14.3  Across hand at thumb web space 18.1  At base of 2nd digit 5.7  (Blank rows = not tested)  LANDMARK LEFT  12/05/2021  10 cm proximal to olecranon process 29.05  Olecranon  process 24.2  10 cm proximal to ulnar styloid process 18.3  Just proximal to ulnar styloid process 13.9  Across hand at thumb web space 17.3  At base of 2nd digit 5.45  (Blank rows = not tested)      QUICK DASH SURVEY: 16%   TODAY'S TREATMENT  12/30/21 Therapeutic Exs Supine Scapular series with red theraband (pts has yellow at home and knows she may need to use this instead if today was over fatiguing) x5 each returning therapist demo for each; added these to HEP Manual Therapy MLD as follows in supine: short neck, 5 diaphragmatic breaths, Lt inguinal nodes and establishment of Lt axillo inguinal pathway then Lt breast moving fluid towards pathway, then into Rt S/L for further work to lateral breast redirecting towards Lt axillo-inguinal anastomosis, then finished retracing steps in supine. STM to Lt axilla and along  areas of tightness at lateal trunk, same done in Rt S/L P/ROM in supine to Lt shoulder into flexion, abd, and D2 to pts available end motion  12/22/21 12/14/2021 Soft tissue mobilization to bilateral UT, pectorals, serratus Lats in supine and SL to scapular area with coco butter. Increased tightness noted initially but improved with treatment. MLD as follows in supine: short neck, 5 diaphragmatic breaths, L inguinal nodes and establishment of axillo inguinal pathway then L breast moving fluid towards pathway and retracing steps  12/19/2021: perfromed ROM to neck, shouders, scapula and upper thoracic spine Pt with tightness with trunk rotation.  Manual lymph drainage then soft tissue work with cocoa butter to left anterior and posterior shoulder, axilla and lateral trunk  in supine and right sidelying with left arm overhead for lateral trunk stretch. Pt instructed to "pin" left axillary area with right hand while she does circles with left arm overhead for AROM and gently muscle contractions. Finished up with soft tissue work to left pec muscle. Pt encouraged to return to yoga and even possibly aqua exercises offerred throught [employer] to continue with flexibility exericises and improve fitness  12/14/2021 Soft tissue mobilization to bilateral UT, pectorals, serratus Lats in supine and SL to scapular area with coco butter. PROM bilateral shoulder flexion, scaption, abduction with MFR to axilla. Bilateral AROM flexion, scaption, abduction Supine horizontal abduction yellow x10, sword bilaterally x 5 Standing bilateral ER x 5 with yellow, biceps curls yellow 2 x 5 Standing bilateral shoulder flexion 2 x 5, scaption 1# 2 x 5     11/28/2021 Exercises - Supine Lower Trunk Rotation  - 2 x daily - 7 x weekly - 1 sets - 3 reps - 10 hold - Doorway Pec Stretch at 90 Degrees Abduction  - 1 x daily - 7 x weekly - 1 sets - 3 reps - 10-15 hold - Single Arm Doorway Pec Stretch at 90 Degrees Abduction  - 1 x  daily - 7 x weekly - 3 sets - 10 reps - 10-15 hold Tied standing lat stretch but did not feel so not added to HEP PATIENT EDUCATION:  Education details: Access Code: [email or web address]; 12/30/21 - Supine Scapular series with red theraband URL: [email or web address] Date: 11/28/2021 Prepared by: Cheral Almas  Exercises - Supine Lower Trunk Rotation  - 2 x daily - 7 x weekly - 1 sets - 3 reps - 10 hold - Doorway Pec Stretch at 90 Degrees Abduction  - 1 x daily - 7 x weekly - 1 sets - 3 reps - 10-15 hold - Single Arm Doorway Pec Stretch at 90 Degrees Abduction  -  1 x daily - 7 x weekly - 3 sets - 10 reps - 10-15 hold  Person educated: Patient Education method: Customer service manager, handout Education comprehension: verbalized understanding and returned demonstration   HOME EXERCISE PROGRAM: Reviewed Home program Single arm and bilateral arm pec stretch in doorway, LTR with arms outstretched; supine scapular series  ASSESSMENT:  CLINICAL IMPRESSION: Progressed HEP to include supine scapular series with red theraband, though pt was instructed to use her yellow one at home if this becomes to fatiguing. She verbalized good understanding and reports these were not fatiguing today. Then continued with manual therapy working to decrease fascial tightness at Lt axilla and lateral trunk, along with MLD to Lt breast.    OBJECTIVE IMPAIRMENTS decreased ROM, decreased strength, increased edema, increased fascial restrictions, impaired UE functional use, and pain.   ACTIVITY LIMITATIONS sleeping, reach over head, and fatigues with drying hair, folding laundry etc  PARTICIPATION LIMITATIONS: cleaning and laundry  PERSONAL FACTORS 1-2 comorbidities: bilateral breast Cancer s/p radiation and SLNB  are also affecting patient's functional outcome.   REHAB POTENTIAL: Good  CLINICAL DECISION MAKING: Stable/uncomplicated  EVALUATION COMPLEXITY: Low  GOALS: Goals reviewed with patient?  Yes  SHORT TERM GOALS: Target date: 01/20/2022    Pt will be independent with HEP for stretching and strengthening bilateral UE's Baseline: Goal status: MET 12/15/2021  2.  Pt will report decreased pain/tightness by 30% Baseline:  Goal status: INITIAL    LONG TERM GOALS: Target date: 02/10/2022    Pts pain will be decreased by 50% or greater Baseline:  Goal status: INITIAL  2.  Pt will be independent with proper technique for left breast MLD Baseline:  Goal status: INITIAL  3.  Pt will be able to use arm for drying hair and folding clothes with 0- minimal fatigue Baseline:  Goal status: INITIAL    PLAN: PT FREQUENCY: 2x/week  PT DURATION: 6 weeks  PLANNED INTERVENTIONS: Therapeutic exercises, Patient/Family education, Joint mobilization, Manual lymph drainage, and Manual therapy  PLAN FOR NEXT SESSION:Review and advance HEP,  progress  UE strength, STM bilateral greatest on left to pecs, lats, UT, PROM, Review MLD to left breast area to left inguinal only, progress strength to assist with arm fatigue  Otelia Limes, PTA 12/30/2021, 12:18 PM   Over Head Pull: Narrow and Wide Grip   Cancer Rehab [phone]   On back, knees bent, feet flat, band across thighs, elbows straight but relaxed. Pull hands apart (start). Keeping elbows straight, bring arms up and over head, hands toward floor. Keep pull steady on band. Hold momentarily. Return slowly, keeping pull steady, back to start. Then do same with a wider grip on the band (past shoulder width) Repeat _5-10__ times. Band color __yellow or red____   Side Pull: Double Arm   On back, knees bent, feet flat. Arms perpendicular to body, shoulder level, elbows straight but relaxed. Pull arms out to sides, elbows straight. Resistance band comes across collarbones, hands toward floor. Hold momentarily. Slowly return to starting position. Repeat _5-10__ times. Band color _yellow or red____   Sword   On back, knees bent,  feet flat, left hand on left hip, right hand above left. Pull right arm DIAGONALLY (hip to shoulder) across chest. Bring right arm along head toward floor. Hold momentarily. Slowly return to starting position. Repeat _5-10__ times. Do with left arm. Band color _yellow or red_____   Shoulder Rotation: Double Arm   On back, knees bent, feet flat, elbows tucked at sides,  bent 90, hands palms up. Pull hands apart and down toward floor, keeping elbows near sides. Hold momentarily. Slowly return to starting position. Repeat _5-10__ times. Band color __yellow or red____

## 2022-01-09 ENCOUNTER — Ambulatory Visit: Payer: Medicare PPO

## 2022-01-10 ENCOUNTER — Encounter: Payer: Self-pay | Admitting: Physical Therapy

## 2022-01-10 ENCOUNTER — Ambulatory Visit: Payer: Medicare PPO | Admitting: Physical Therapy

## 2022-01-10 DIAGNOSIS — C50411 Malignant neoplasm of upper-outer quadrant of right female breast: Secondary | ICD-10-CM

## 2022-01-10 DIAGNOSIS — R609 Edema, unspecified: Secondary | ICD-10-CM

## 2022-01-10 DIAGNOSIS — R293 Abnormal posture: Secondary | ICD-10-CM | POA: Diagnosis not present

## 2022-01-10 DIAGNOSIS — H401121 Primary open-angle glaucoma, left eye, mild stage: Secondary | ICD-10-CM | POA: Diagnosis not present

## 2022-01-10 DIAGNOSIS — Z483 Aftercare following surgery for neoplasm: Secondary | ICD-10-CM | POA: Diagnosis not present

## 2022-01-10 DIAGNOSIS — C50412 Malignant neoplasm of upper-outer quadrant of left female breast: Secondary | ICD-10-CM | POA: Diagnosis not present

## 2022-01-10 NOTE — Therapy (Signed)
OUTPATIENT PHYSICAL THERAPY ONCOLOGY TREATMENT  Patient Name: April [name] MRN: [id number] DOB:12-07-1950, 71 y.o., female Today's Date: 01/10/2022   PT End of Session - 01/10/22 1008     Visit Number 9    Number of Visits 12    Date for PT Re-Evaluation 01/09/22    Authorization Time Period 6/12-7/24/2023    Authorization - Visit Number 9    Authorization - Number of Visits 12    PT Start Time 1008    PT Stop Time 1050    PT Time Calculation (min) 42 min    Activity Tolerance Patient tolerated treatment well    Behavior During Therapy [facility] for tasks assessed/performed              Past Medical History:  Diagnosis Date   Breast cancer ([place]) 05/2017   right breast   Breast cancer ([place]) 06/2020   left breast IDC   Eczema    Family history of breast cancer    Family history of ovarian cancer    History of radiation therapy 10/17/17- 11/14/17   40.05 directed to the right breast in 15 fractions, followed by a boost of 10 gy given in 5 fractions.    Hyperlipidemia    Hypertension    toxemia during pregnancy, no meds now   Past Surgical History:  Procedure Laterality Date   ABDOMINAL HYSTERECTOMY     BREAST LUMPECTOMY WITH RADIOACTIVE SEED AND SENTINEL LYMPH NODE BIOPSY Right 07/17/2017   Procedure: BREAST LUMPECTOMY WITH RADIOACTIVE SEED AND SENTINEL LYMPH NODE BIOPSY;  Surgeon: Rolm Bookbinder, MD;  Location: [place];  Service: General;  Laterality: Right;   BREAST LUMPECTOMY WITH RADIOACTIVE SEED AND SENTINEL LYMPH NODE BIOPSY Left 07/13/2020   Procedure: LEFT BREAST LUMPECTOMY WITH RADIOACTIVE SEED AND LEFT AXILLARY SENTINEL LYMPH NODE BIOPSY;  Surgeon: Rolm Bookbinder, MD;  Location: [place];  Service: General;  Laterality: Left;   CESAREAN SECTION     x4   DILATION AND CURETTAGE OF UTERUS     KNEE SURGERY Right    PORT-A-CATH REMOVAL Right 09/04/2018   Procedure: REMOVAL PORT-A-CATH;  Surgeon: Rolm Bookbinder, MD;   Location: [place];  Service: General;  Laterality: Right;   PORT-A-CATH REMOVAL Right 09/05/2021   Procedure: REMOVAL PORT-A-CATH;  Surgeon: Rolm Bookbinder, MD;  Location: [place];  Service: General;  Laterality: Right;   PORTACATH PLACEMENT N/A 07/17/2017   Procedure: INSERTION PORT-A-CATH WITH [place];  Surgeon: Rolm Bookbinder, MD;  Location: [place];  Service: General;  Laterality: N/A;   PORTACATH PLACEMENT Right 07/13/2020   Procedure: INSERTION PORT-A-CATH WITH ULTRASOUND GUIDANCE;  Surgeon: Rolm Bookbinder, MD;  Location: [place];  Service: General;  Laterality: Right;   Patient Active Problem List   Diagnosis Date Noted   Age-related osteoporosis without current pathological fracture 09/08/2020   Allergic rhinitis 09/08/2020   Palpitations 09/08/2020   Personal history of malignant neoplasm of breast 09/08/2020   Personal history of transient ischemic attack (TIA), and cerebral infarction without residual deficits 09/08/2020   Pure hypercholesterolemia 09/08/2020   Vitamin D deficiency 09/08/2020   Neuropathy due to chemotherapeutic drug ([place]) 07/23/2018   Port-A-Cath in place 08/28/2017   TIA (transient ischemic attack) 08/09/2017   Hypertension    Genetic testing 07/06/2017   Family history of ovarian cancer    Family history of breast cancer    Malignant neoplasm of upper-outer quadrant of right breast in female, estrogen receptor negative (  [place]) 06/26/2017   Malignant neoplasm of left breast, estrogen receptor positive ([place]) 05/19/2017    PCP: Kelton Pillar, MD  REFERRING PROVIDER: Wilber Bihari, NP  REFERRING DIAG: S/p Breast Cancer bilaterally  THERAPY DIAG:  Aftercare following surgery for neoplasm  Edema, unspecified type  Abnormal posture  Malignant neoplasm of upper-outer quadrant of right female breast, unspecified estrogen receptor status ([place])  Malignant neoplasm of upper-outer  quadrant of left female breast, unspecified estrogen receptor status ([place])  ONSET DATE: 09/2021  Rationale for Evaluation and Treatment Rehabilitation  SUBJECTIVE   I think I am doing fine. I think I am doing ok and may be ready to discharge. I just feel a little sore under my arm when I do the rubberband stretches.   PERTINENT HISTORY:  Pt with prior history of right breast cancer  with surgery on 07/17/17 for right lumpectomy and SLNB with 5 LN removed.  Had chemo and radiation. Most recent surgery1/25/22 for left breast lumpectomy with 0/5 positive nodes. She had chemo, radiation and infusions for immunotherapy and completed all in February 2023   PAIN:  Are you having pain? No  PRECAUTIONS: Other: lymphedema risk  WEIGHT BEARING RESTRICTIONS No  FALLS:  Has patient fallen in last 6 months? No  LIVING ENVIRONMENT: Lives with: lives with their spouse Lives in: House/apartment Stairs: Yes; Internal: 14 steps; on right going up, and half way up on the left Has following equipment at home: None  OCCUPATION: retired  LEISURE: attending grandsons games,walking some but not regularly  HAND DOMINANCE : right   PRIOR LEVEL OF FUNCTION: Independent  PATIENT GOALS Relieve soreness in armpit area,check breast for lymphedema   OBJECTIVE  COGNITION:  Overall cognitive status: Within functional limits for tasks assessed   PALPATION: Tenderness axillary border of left pectorals,serratus/lats. mild thickening/fibrosis noted left breast proximal to nipple  OBSERVATIONS / OTHER ASSESSMENTS: left nipple appears slightly flatter,  SENSATION:  Light touch: Deficits left breast at areola incision is decreased, other areas are fine    POSTURE: forward head, round shoulders  UPPER EXTREMITY AROM/PROM:  A/PROM RIGHT   eval  R 12/22/21  Shoulder extension 40 58  Shoulder flexion 148 referred mild pain into mid delt 170  Shoulder abduction 164 175  Shoulder internal rotation 75    Shoulder external rotation 86     (Blank rows = not tested)  A/PROM LEFT   eval R 12/22/21  Shoulder extension 53 55  Shoulder flexion 150 169  Shoulder abduction 165 179  Shoulder internal rotation 70    Shoulder external rotation 80 pain in left shoulder     (Blank rows = not tested)   CERVICAL AROM: All within functional limits:      UPPER EXTREMITY STRENGTH: WNL   LYMPHEDEMA ASSESSMENTS:   SURGERY TYPE/DATE: Left lumpectomy 07/13/2020, Right lumpectomy 07/17/2017  NUMBER OF LYMPH NODES REMOVED: 5 right, 5 left  CHEMOTHERAPY: yes  RADIATION:yes  HORMONE TREATMENT: yes  INFECTIONS:   LYMPHEDEMA ASSESSMENTS:   LANDMARK RIGHT  12/05/2021  10 cm proximal to olecranon process 29.2  Olecranon process 24.6  10 cm proximal to ulnar styloid process 18.9  Just proximal to ulnar styloid process 14.3  Across hand at thumb web space 18.1  At base of 2nd digit 5.7  (Blank rows = not tested)  LANDMARK LEFT  12/05/2021  10 cm proximal to olecranon process 29.05  Olecranon process 24.2  10 cm proximal to ulnar styloid process 18.3  Just proximal to ulnar styloid process  13.9  Across hand at thumb web space 17.3  At base of 2nd digit 5.45  (Blank rows = not tested)      QUICK DASH SURVEY: 16%   TODAY'S TREATMENT  01/10/22 STM to bilateral axilla in areas of tightness since pt reports this is the only dificulty she is still having 12/30/21 Therapeutic Exs Supine Scapular series with red theraband (pts has yellow at home and knows she may need to use this instead if today was over fatiguing) x5 each returning therapist demo for each; added these to HEP Manual Therapy MLD as follows in supine: short neck, 5 diaphragmatic breaths, Lt inguinal nodes and establishment of Lt axillo inguinal pathway then Lt breast moving fluid towards pathway, then into Rt S/L for further work to lateral breast redirecting towards Lt axillo-inguinal anastomosis, then finished retracing  steps in supine. STM to Lt axilla and along areas of tightness at lateal trunk, same done in Rt S/L P/ROM in supine to Lt shoulder into flexion, abd, and D2 to pts available end motion  12/22/21 12/14/2021 Soft tissue mobilization to bilateral UT, pectorals, serratus Lats in supine and SL to scapular area with coco butter. Increased tightness noted initially but improved with treatment. MLD as follows in supine: short neck, 5 diaphragmatic breaths, L inguinal nodes and establishment of axillo inguinal pathway then L breast moving fluid towards pathway and retracing steps  PATIENT EDUCATION:  Education details: Access Code: [email or web address]; 12/30/21 - Supine Scapular series with red theraband URL: [email or web address] Date: 11/28/2021 Prepared by: Cheral Almas  Exercises - Supine Lower Trunk Rotation  - 2 x daily - 7 x weekly - 1 sets - 3 reps - 10 hold - Doorway Pec Stretch at 90 Degrees Abduction  - 1 x daily - 7 x weekly - 1 sets - 3 reps - 10-15 hold - Single Arm Doorway Pec Stretch at 90 Degrees Abduction  - 1 x daily - 7 x weekly - 3 sets - 10 reps - 10-15 hold  Person educated: Patient Education method: Customer service manager, handout Education comprehension: verbalized understanding and returned demonstration   HOME EXERCISE PROGRAM: Reviewed Home program Single arm and bilateral arm pec stretch in doorway, LTR with arms outstretched; supine scapular series  ASSESSMENT:  CLINICAL IMPRESSION: Assessed pt's progress towards goals in therapy. Pt has now met all goals for therapy and is ready to be discharged. Continued with STM to bilateral axilla in area of tightness since pt only feels tightness when doing her band exercises in this area. Pt will be discharged from skilled PT services at this time.    OBJECTIVE IMPAIRMENTS decreased ROM, decreased strength, increased edema, increased fascial restrictions, impaired UE functional use, and pain.   ACTIVITY  LIMITATIONS sleeping, reach over head, and fatigues with drying hair, folding laundry etc  PARTICIPATION LIMITATIONS: cleaning and laundry  PERSONAL FACTORS 1-2 comorbidities: bilateral breast Cancer s/p radiation and SLNB  are also affecting patient's functional outcome.   REHAB POTENTIAL: Good  CLINICAL DECISION MAKING: Stable/uncomplicated  EVALUATION COMPLEXITY: Low  GOALS: Goals reviewed with patient? Yes  SHORT TERM GOALS: Target date: 01/31/2022    Pt will be independent with HEP for stretching and strengthening bilateral UE's Baseline: Goal status: MET 12/15/2021  2.  Pt will report decreased pain/tightness by 30% Baseline:  Goal status: MET 01/10/22- 99% improvement    LONG TERM GOALS: Target date: 02/21/2022    Pts pain will be decreased by 50% or greater Baseline:  Goal status: MET 01/10/22- pt  reports her pain is 95-99% improved  2.  Pt will be independent with proper technique for left breast MLD Baseline:  Goal status: MET 01/10/22- pt feels independent with this  3.  Pt will be able to use arm for drying hair and folding clothes with 0- minimal fatigue Baseline:  Goal status: MET 01/10/22- pt is able to do these activities with minimal fatigue    PLAN: PT FREQUENCY: 2x/week  PT DURATION: 6 weeks  PLANNED INTERVENTIONS: Therapeutic exercises, Patient/Family education, Joint mobilization, Manual lymph drainage, and Manual therapy  PLAN FOR NEXT SESSION:Review and advance HEP,  progress  UE strength, STM bilateral greatest on left to pecs, lats, UT, PROM, Review MLD to left breast area to left inguinal only, progress strength to assist with arm fatigue  [employer], PT 01/10/2022, 10:56 AM  PHYSICAL THERAPY DISCHARGE SUMMARY  Visits from Start of Care: 9  Current functional level related to goals / functional outcomes: All goals met   Remaining deficits: None   Education / Equipment: HEP, self MLD   Patient agrees to discharge. Patient  goals were met. Patient is being discharged due to meeting the stated rehab goals.

## 2022-01-12 ENCOUNTER — Ambulatory Visit: Payer: Medicare PPO

## 2022-01-16 ENCOUNTER — Ambulatory Visit: Payer: Medicare PPO

## 2022-01-18 ENCOUNTER — Encounter: Payer: Medicare PPO | Admitting: Physical Therapy

## 2022-01-19 ENCOUNTER — Ambulatory Visit: Payer: Medicare PPO

## 2022-01-22 ENCOUNTER — Other Ambulatory Visit: Payer: Self-pay | Admitting: Adult Health

## 2022-01-22 DIAGNOSIS — Z170 Estrogen receptor positive status [ER+]: Secondary | ICD-10-CM

## 2022-01-22 DIAGNOSIS — C50411 Malignant neoplasm of upper-outer quadrant of right female breast: Secondary | ICD-10-CM

## 2022-01-26 DIAGNOSIS — H401121 Primary open-angle glaucoma, left eye, mild stage: Secondary | ICD-10-CM | POA: Diagnosis not present

## 2022-01-31 DIAGNOSIS — H16223 Keratoconjunctivitis sicca, not specified as Sjogren's, bilateral: Secondary | ICD-10-CM | POA: Diagnosis not present

## 2022-01-31 DIAGNOSIS — H0288B Meibomian gland dysfunction left eye, upper and lower eyelids: Secondary | ICD-10-CM | POA: Diagnosis not present

## 2022-01-31 DIAGNOSIS — H0288A Meibomian gland dysfunction right eye, upper and lower eyelids: Secondary | ICD-10-CM | POA: Diagnosis not present

## 2022-02-05 DIAGNOSIS — Z882 Allergy status to sulfonamides status: Secondary | ICD-10-CM | POA: Diagnosis not present

## 2022-02-05 DIAGNOSIS — Z91040 Latex allergy status: Secondary | ICD-10-CM | POA: Diagnosis not present

## 2022-02-05 DIAGNOSIS — C50919 Malignant neoplasm of unspecified site of unspecified female breast: Secondary | ICD-10-CM | POA: Diagnosis not present

## 2022-02-05 DIAGNOSIS — Z7982 Long term (current) use of aspirin: Secondary | ICD-10-CM | POA: Diagnosis not present

## 2022-02-05 DIAGNOSIS — Z79811 Long term (current) use of aromatase inhibitors: Secondary | ICD-10-CM | POA: Diagnosis not present

## 2022-02-05 DIAGNOSIS — Z8673 Personal history of transient ischemic attack (TIA), and cerebral infarction without residual deficits: Secondary | ICD-10-CM | POA: Diagnosis not present

## 2022-02-05 DIAGNOSIS — Z8249 Family history of ischemic heart disease and other diseases of the circulatory system: Secondary | ICD-10-CM | POA: Diagnosis not present

## 2022-02-05 DIAGNOSIS — Z803 Family history of malignant neoplasm of breast: Secondary | ICD-10-CM | POA: Diagnosis not present

## 2022-02-05 DIAGNOSIS — R030 Elevated blood-pressure reading, without diagnosis of hypertension: Secondary | ICD-10-CM | POA: Diagnosis not present

## 2022-02-13 DIAGNOSIS — U071 COVID-19: Secondary | ICD-10-CM | POA: Diagnosis not present

## 2022-02-13 DIAGNOSIS — R0981 Nasal congestion: Secondary | ICD-10-CM | POA: Diagnosis not present

## 2022-03-04 IMAGING — CT CT CHEST W/ CM
2 of 4 series · 15 of 36 positions shown, 18 images · IV contrast (omnipaque)
Comparison: None.

CLINICAL DATA: Breast cancer.  Restaging.

EXAM:
CT CHEST WITH CONTRAST
TECHNIQUE: Multidetector CT imaging of the chest was performed during
intravenous contrast administration.
CONTRAST:  75mL OMNIPAQUE IOHEXOL 300 MG/ML  SOLN

[Series 2: axial st · axial · 0.58mm/px · z∈[+1273,+1533]mm · 12 of 154 slices shown, 15 images]
[im 12/154  mediastinal]
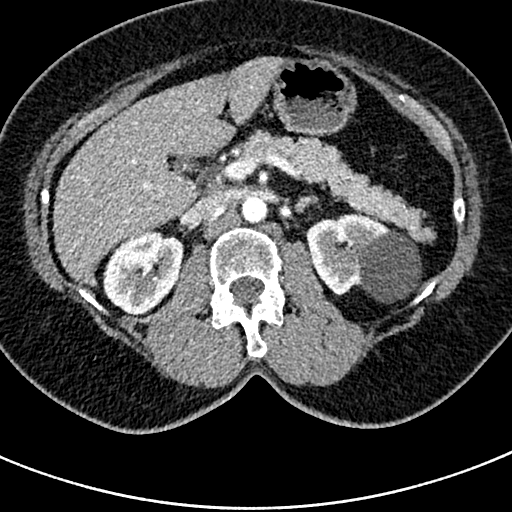
[im 12/154  lung]
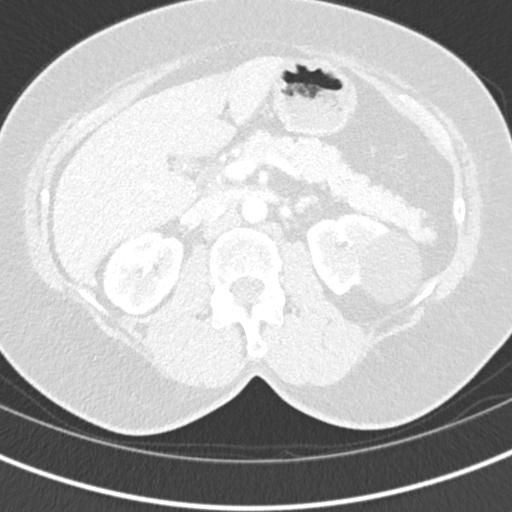
[im 24/154  lung]
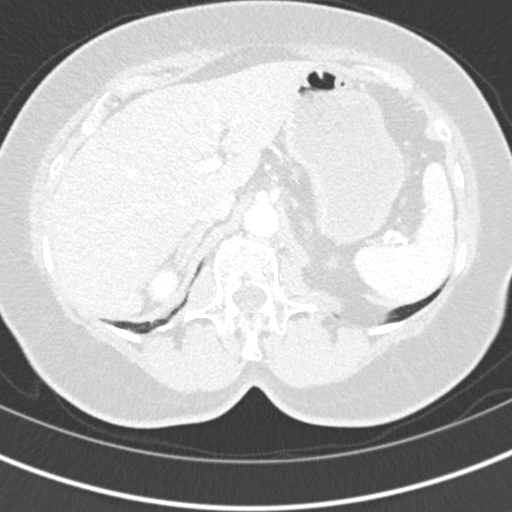
[im 36/154  lung]
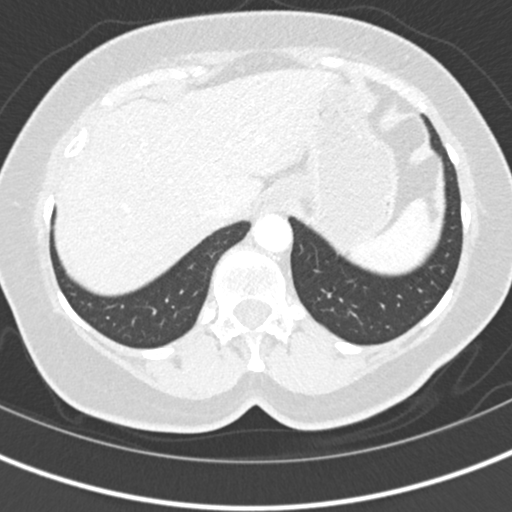
[im 48/154  lung]
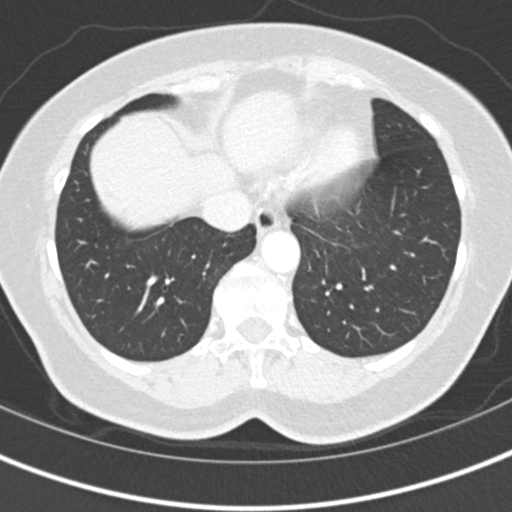
[im 59/154  mediastinal]
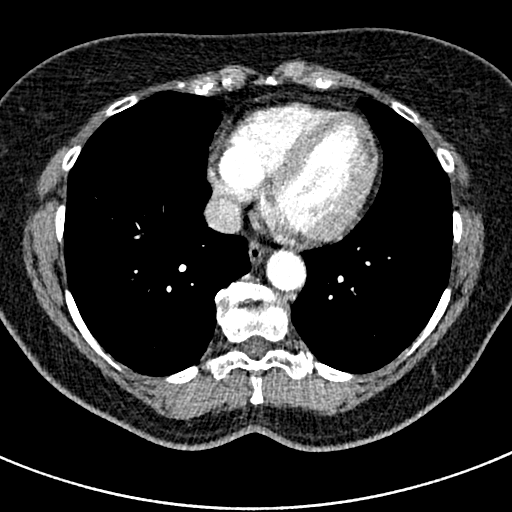
[im 59/154  lung]
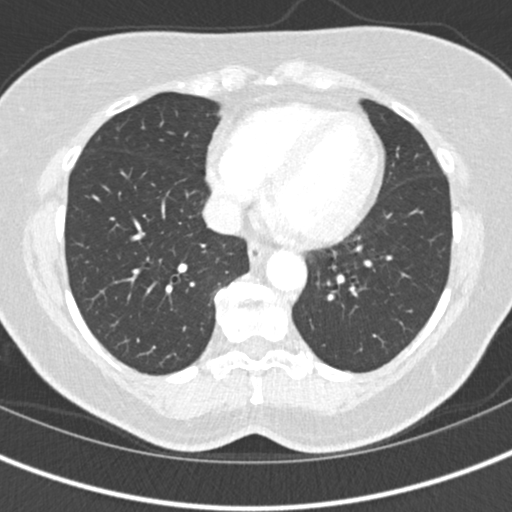
[im 71/154  lung]
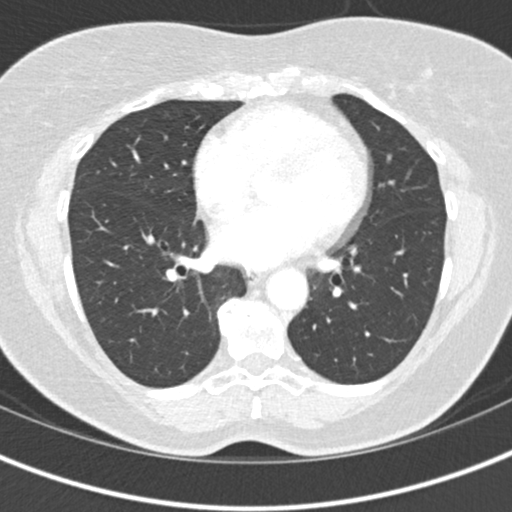
[im 83/154  lung]
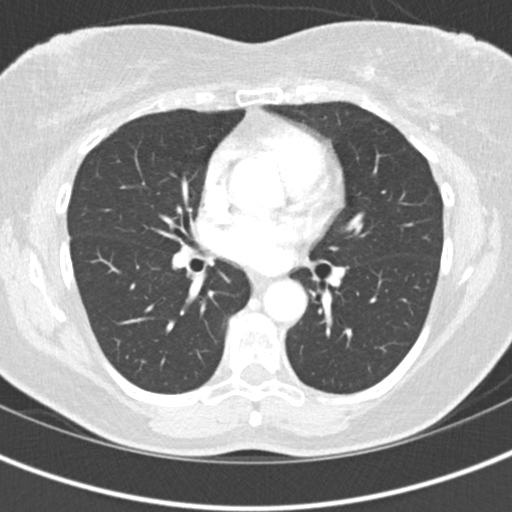
[im 95/154  lung]
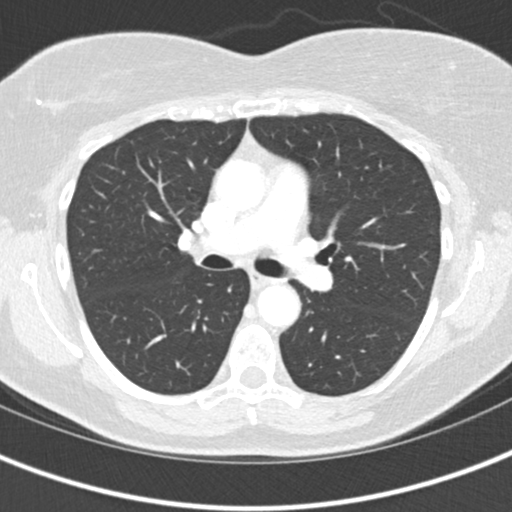
[im 106/154  mediastinal]
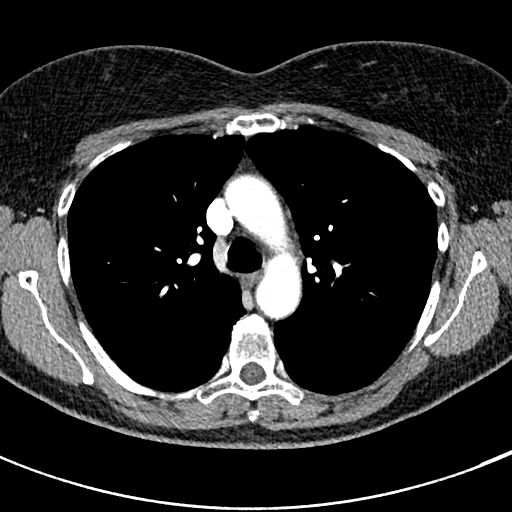
[im 106/154  lung]
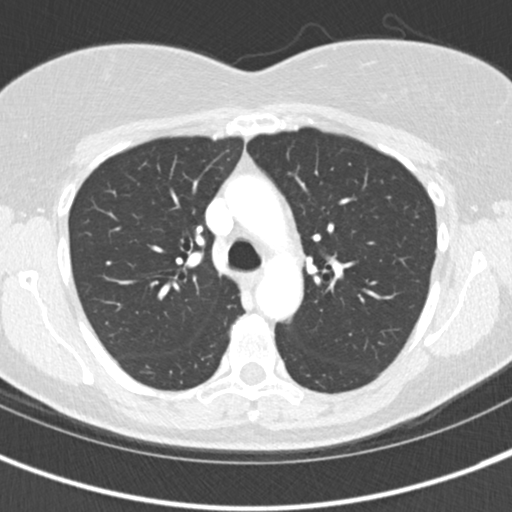
[im 118/154  lung]
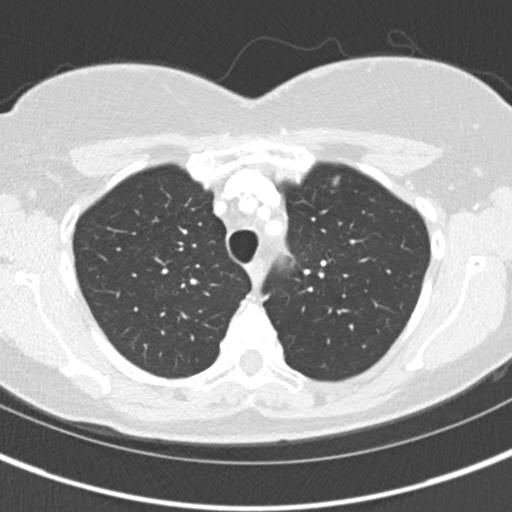
[im 130/154  lung]
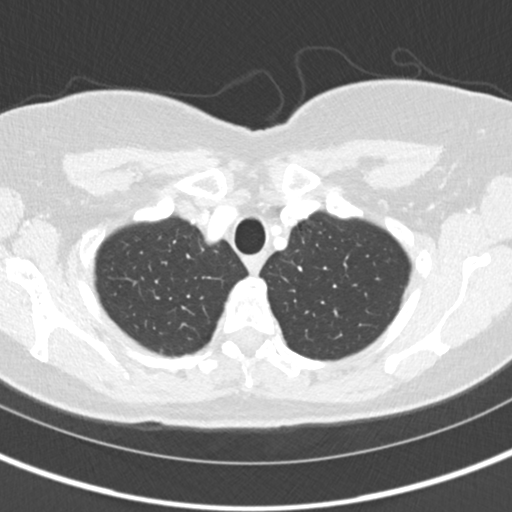
[im 142/154  lung]
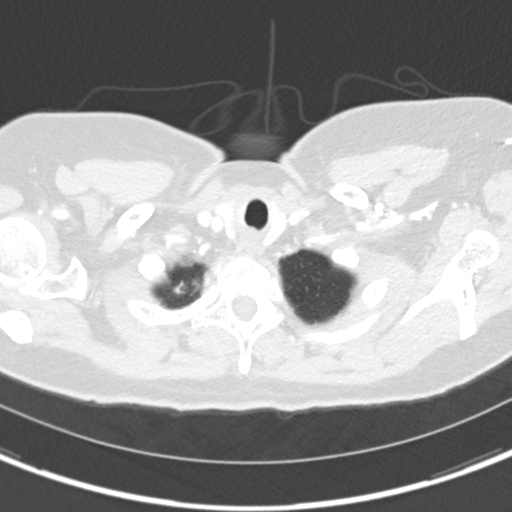

[Series 6: coronal · coronal · 0.61mm/px · 3 of 118 slices shown]
[im 24/118  lung]
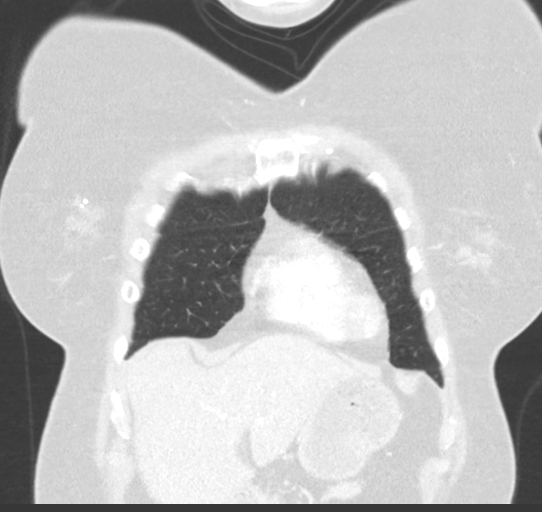
[im 47/118  lung]
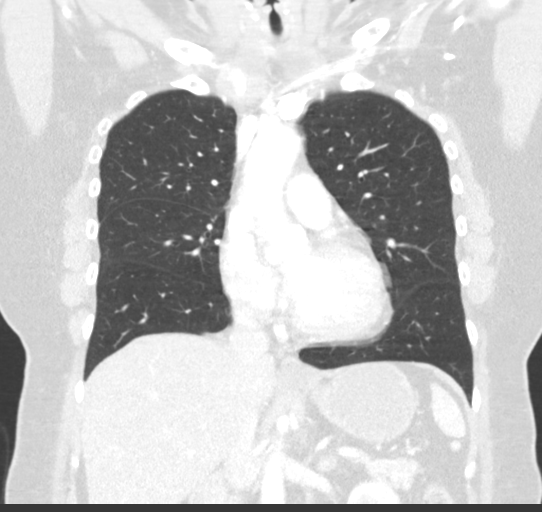
[im 71/118  lung]
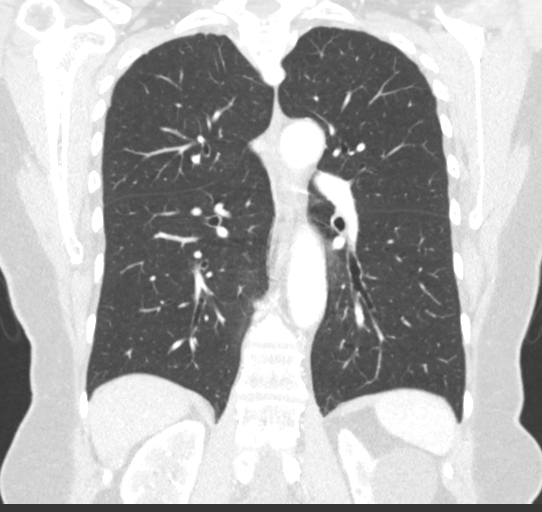

[15 of 36 positions shown; findings below may reference images not displayed]

FINDINGS: Cardiovascular: The heart size is normal. No substantial pericardial
effusion.

Mediastinum/Nodes: No mediastinal lymphadenopathy. There is no hilar
lymphadenopathy. The esophagus has normal imaging features. There is
no axillary lymphadenopathy.

Lungs/Pleura: No suspicious pulmonary nodule or mass. No focal
airspace consolidation. No pleural effusion.

Upper Abdomen: Small hypodensities in the lateral segment left liver
cannot be definitively characterized but are likely benign and
probably cysts. Small area of low attenuation in the anterior liver,
adjacent to the falciform ligament, is in a characteristic location
for focal fatty deposition. 4.8 cm cyst noted interpolar left
kidney.

Musculoskeletal: No worrisome lytic or sclerotic osseous
abnormality. No worrisome lytic or sclerotic osseous abnormality.
IMPRESSION: 1. No evidence for metastatic disease in the chest.
2. Small hypodensities in the lateral segment left liver cannot be
definitively characterized but are likely benign.
3. 4.8 cm left renal cyst.

## 2022-03-10 DIAGNOSIS — C50212 Malignant neoplasm of upper-inner quadrant of left female breast: Secondary | ICD-10-CM | POA: Diagnosis not present

## 2022-03-10 DIAGNOSIS — Z170 Estrogen receptor positive status [ER+]: Secondary | ICD-10-CM | POA: Diagnosis not present

## 2022-04-24 DIAGNOSIS — E7800 Pure hypercholesterolemia, unspecified: Secondary | ICD-10-CM | POA: Diagnosis not present

## 2022-04-24 DIAGNOSIS — M810 Age-related osteoporosis without current pathological fracture: Secondary | ICD-10-CM | POA: Diagnosis not present

## 2022-04-24 DIAGNOSIS — Z8673 Personal history of transient ischemic attack (TIA), and cerebral infarction without residual deficits: Secondary | ICD-10-CM | POA: Diagnosis not present

## 2022-04-24 DIAGNOSIS — Z853 Personal history of malignant neoplasm of breast: Secondary | ICD-10-CM | POA: Diagnosis not present

## 2022-06-02 DIAGNOSIS — H16223 Keratoconjunctivitis sicca, not specified as Sjogren's, bilateral: Secondary | ICD-10-CM | POA: Diagnosis not present

## 2022-06-29 ENCOUNTER — Ambulatory Visit: Payer: Medicare PPO | Admitting: Hematology and Oncology

## 2022-06-29 ENCOUNTER — Encounter: Payer: Self-pay | Admitting: *Deleted

## 2022-06-29 NOTE — Progress Notes (Signed)
Received mammogram report from [facility] showing suspicious 0.3 cm area in the left breast with recommendations for biopsy.  Pt is currently scheduled for biopsy on 07/06/22 and RN scheduled MD f/u to review results.  Pt educated and verbalized understanding.

## 2022-07-04 ENCOUNTER — Ambulatory Visit: Payer: Medicare PPO | Admitting: Hematology and Oncology

## 2022-07-04 ENCOUNTER — Other Ambulatory Visit: Payer: Self-pay | Admitting: *Deleted

## 2022-07-04 ENCOUNTER — Encounter: Payer: Self-pay | Admitting: Hematology and Oncology

## 2022-07-04 MED ORDER — ANASTROZOLE 1 MG PO TABS: 1.0000 mg | ORAL_TABLET | Freq: Every day | ORAL | 3 refills | Status: DC

## 2022-07-06 ENCOUNTER — Other Ambulatory Visit: Payer: Self-pay

## 2022-07-11 ENCOUNTER — Other Ambulatory Visit: Payer: Self-pay

## 2022-07-11 ENCOUNTER — Inpatient Hospital Stay: Payer: Medicare PPO | Attending: Hematology and Oncology | Admitting: Hematology and Oncology

## 2022-07-11 VITALS — BP 177/80 | HR 85 | Temp 97.3°F | Resp 17 | Wt 150.6 lb

## 2022-07-11 DIAGNOSIS — C50411 Malignant neoplasm of upper-outer quadrant of right female breast: Secondary | ICD-10-CM | POA: Diagnosis present

## 2022-07-11 DIAGNOSIS — Z886 Allergy status to analgesic agent status: Secondary | ICD-10-CM | POA: Diagnosis not present

## 2022-07-11 DIAGNOSIS — Z79899 Other long term (current) drug therapy: Secondary | ICD-10-CM | POA: Insufficient documentation

## 2022-07-11 DIAGNOSIS — Z882 Allergy status to sulfonamides status: Secondary | ICD-10-CM | POA: Insufficient documentation

## 2022-07-11 DIAGNOSIS — Z171 Estrogen receptor negative status [ER-]: Secondary | ICD-10-CM | POA: Diagnosis not present

## 2022-07-11 DIAGNOSIS — C50212 Malignant neoplasm of upper-inner quadrant of left female breast: Secondary | ICD-10-CM | POA: Diagnosis not present

## 2022-07-11 DIAGNOSIS — Z8041 Family history of malignant neoplasm of ovary: Secondary | ICD-10-CM | POA: Insufficient documentation

## 2022-07-11 DIAGNOSIS — Z79811 Long term (current) use of aromatase inhibitors: Secondary | ICD-10-CM | POA: Diagnosis not present

## 2022-07-11 DIAGNOSIS — Z170 Estrogen receptor positive status [ER+]: Secondary | ICD-10-CM | POA: Diagnosis not present

## 2022-07-11 DIAGNOSIS — Z888 Allergy status to other drugs, medicaments and biological substances status: Secondary | ICD-10-CM | POA: Diagnosis not present

## 2022-07-11 DIAGNOSIS — Z803 Family history of malignant neoplasm of breast: Secondary | ICD-10-CM | POA: Insufficient documentation

## 2022-07-11 NOTE — Assessment & Plan Note (Addendum)
06/20/2017: Right breast cancer T1BN0 stage Ia grade 3, ER/PR negative, HER2 +0/5 lymph nodes negative (genetics negative), adjuvant chemo with Taxol Herceptin with Herceptin maintenance for 1 year, adjuvant radiation 06/15/2020: Left breast cancer T1N0 grade 3 IDC, weak ER positive, PR negative, biopsy had HER2 amplified with a Ki-67 of 45%, status post left lumpectomy: T1CN0 stage Ib grade 3, 0/5 lymph nodes (HER2 negative on final path) Adjuvant chemo with CMF Herceptin followed by Herceptin maintenance completed February 2023, XRT   Treatment plan: Adjuvant antiestrogen therapy with  anastrozole 1 mg daily  started 02/2021 Anastrozole toxicities: It appears that patient has a recurrence of DCIS in spite of taking anastrozole therapy.  Breast Cancer Surveillance:  Breast Exam: 07/11/22: Benign Mammograms 06/29/22: new 0.3 cm calcs Right breast central to nipple middle depth suspicious for malignancy (biopsy recommended), 1 cm pleomorphic calcs left breast suspicious, total span 3.8 cm 3. Left breast Biopsy: HG DCIS, focally suspicious for microinvasion ER 80%, PR 0%; Right Breast biopsy: CSL, focal necrosis 4.  Surgical options: Bilateral lumpectomies versus bilateral mastectomies versus left-sided mastectomy and right-sided lumpectomy. She will discuss with Dr. Donne Hazel and make a final decision.  RTC after surgery to discuss antiestrogen therapy.  We may switch her from anastrozole to letrozole.

## 2022-07-11 NOTE — Progress Notes (Signed)
Patient Care Team: Kelton Pillar, MD as PCP - General (Family Medicine) Rolm Bookbinder, MD as Consulting Physician (General Surgery) Eppie Gibson, MD as Attending Physician (Radiation Oncology) Newt Minion, MD as Consulting Physician (Orthopedic Surgery) Wallene Huh, DPM as Consulting Physician (Podiatry) Neldon Mc, Donnamarie Poag, MD as Consulting Physician (Allergy and Immunology) Haroldine Laws, Shaune Pascal, MD as Consulting Physician (Cardiology) Nicholas Lose, MD as Consulting Physician (Hematology and Oncology)  DIAGNOSIS:  Encounter Diagnosis  Name Primary?   Malignant neoplasm of upper-outer quadrant of right breast in female, estrogen receptor negative ([place]) Yes    SUMMARY OF ONCOLOGIC HISTORY: Oncology History  Malignant neoplasm of upper-outer quadrant of right breast in female, estrogen receptor negative ([place])  06/20/2017 Initial Diagnosis    [place] woman status post right breast upper outer quadrant biopsy 06/20/2017 for a clinical T1b N0, stage IA invasive ductal carcinoma, grade 3, estrogen and progesterone receptor negative, but HER-2 amplified, with an MIB-1 of 30%                  07/05/2017 Genetic Testing   The patient had genetic testing due to a personal history of breast cancer and family history of breast and ovarian cancer.  The Common Hereditary Cancer Panel was ordered. The Common Hereditary Cancer Panel offered by [facility] includes sequencing and/or deletion duplication testing of the following 47 genes: APC, ATM, AXIN2, BARD1, BMPR1A, BRCA1, BRCA2, BRIP1, CDH1, CDKN2A (p14ARF), CDKN2A (p16INK4a), CKD4, CHEK2, CTNNA1, DICER1, EPCAM (Deletion/duplication testing only), GREM1 (promoter region deletion/duplication testing only), KIT, MEN1, MLH1, MSH2, MSH3, MSH6, MUTYH, NBN, NF1, NHTL1, PALB2, PDGFRA, PMS2, POLD1, POLE, PTEN, RAD50, RAD51C, RAD51D, SDHB, SDHC, SDHD, SMAD4, SMARCA4. STK11, TP53, TSC1, TSC2, and VHL.  The following genes were evaluated for sequence  changes only: SDHA and HOXB13 c.251G>A variant only.  Results: No pathogenic variants identified.  A Variant of uncertain significance in the gene MSH2 was identified c.1331G>T (p.Arg444Leu).  The date of this test report is 07/05/2017.  UPDATE:MSH2 c.1331G>T (p.Arg444Leu) has been reclassified to Benign.  The amended report date is December 27, 2020.   07/17/2017 Surgery   status post right lumpectomy and sentinel lymph node sampling for a pT1c pN0, stage IA invasive ductal carcinoma, grade 2, with negative margins.  A total of 5 lymph nodes were removed    08/07/2017 - 07/23/2018 Adjuvant Chemotherapy   adjuvant chemotherapy consisting of paclitaxel weekly x12 together with trastuzumab every 21 days   10/17/2017 - 11/14/2017 Radiation Therapy   adjuvant radiation: 40.05 Gy directed to the Right Breast in 15 fractions, followed by a boost of 10 Gy given in 5 fractions    07/27/2020 - 12/30/2020 Chemotherapy   adjuvant chemotherapy and anti-HER2 immunotherapy started 07/27/2020, consisting of CMF chemotherapy to be repeated every 21 days x 8, completed 12/28/2020     07/27/2020 - 08/02/2021 Chemotherapy   Patient is on Treatment Plan : BREAST Trastuzumab q21d     02/2021 -  Anti-estrogen oral therapy   Anastrozole daily X 5 years   07/06/2022 Relapse/Recurrence   Annual screening mammogram detected new 0.3 cm group of calcifications in the right breast and a 1 cm group of calcifications in the left breast spanning 3.8 cm total, biopsy left breast: High-grade DCIS solid type with necrosis with focally suspicious for microinvasion ER 80%, PR 0%, right breast biopsy: CSL   Malignant neoplasm of left breast, estrogen receptor positive ([place])  05/19/2017 Initial Diagnosis   Malignant neoplasm of left breast, estrogen receptor negative ([place])  06/15/2020 Relapse/Recurrence   left breast upper inner quadrant biopsy 06/15/2020 for a clinical T1 N0 invasive ductal carcinoma, grade 3, weakly estrogen receptor  positive, progesterone receptor negative, HER2 amplified, with an MIB-1 of 45%   07/13/2020 Cancer Staging   Staging form: Breast, AJCC 8th Edition - Pathologic stage from 07/13/2020: Stage IA (pT1c, pN0, cM0, G3, ER+, PR-, HER2+) - Signed by Gardenia Phlegm, NP on 07/28/2020 Stage prefix: Initial diagnosis Histologic grading system: 3 grade system   07/13/2020 Surgery   left lumpectomy and sentinel lymph node sampling 07/13/2020 for a pT1c pN0, stage IB invasive ductal carcinoma, grade 3, with negative margins, 5 axilla lymph nodes were removed, all benign, epeat prognostic panel again weakly estrogen receptor positive, progesterone receptor negative, now HER-2 not amplified   07/27/2020 - 12/28/2020 Adjuvant Chemotherapy   adjuvant chemotherapy and anti-HER2 immunotherapy started 07/27/2020, consisting of CMF chemotherapy to be repeated every 21 days x 8, completed 12/28/2020   09/09/2020 - 10/20/2020 Radiation Therapy   Site Technique Total Dose (Gy) Dose per Fx (Gy) Completed Fx Beam Energies  Breast, Left: Breast_Lt 3D 50/50 2 25/25 6X, 10X  Breast, Left: Breast_Lt_Bst 3D 10/10 2 5/5 6X, 10X       CHIEF COMPLIANT: Review biopsy  INTERVAL HISTORY: April [name] is a 72 y.o. with above-mentioned history of left breast cancer and prior history of right breast cancer.  Mammogram done in January at [facility] revealed bilateral breast abnormalities.  In the right breast there was a 0.3 cm group of calcifications.  In the left breast there appeared to be 1 cm group of new calcifications and a few separate calcifications together spanning 3.8 cm.  Biopsy of the right breast was CSL and the left breast was high-grade DCIS.  ALLERGIES:  is allergic to tape, betamethasone dipropionate, caffeine, cetirizine hcl, fosamax [alendronate], lisinopril, loratadine, nsaids, other, statins, and sulfa antibiotics.  MEDICATIONS:  Current Outpatient Medications  Medication Sig Dispense Refill    anastrozole (ARIMIDEX) 1 MG tablet Take 1 tablet (1 mg total) by mouth daily. 90 tablet 3   aspirin 81 MG chewable tablet Chew 81 mg by mouth daily.     Calcium Carbonate-Vitamin D (CALCIUM-VITAMIN D3 PO) Take 1,200 mg by mouth daily.     co-enzyme Q-10 30 MG capsule Take 30 mg by mouth daily.     Omega-3 Fatty Acids (FISH OIL) 1000 MG CAPS Take 1 capsule by mouth daily.     Pyridoxine HCl (VITAMIN B6) 250 MG TABS 1 tablet     Red Yeast Rice Extract (RED YEAST RICE PO) Take by mouth.     vitamin B-12 (CYANOCOBALAMIN) 1000 MCG tablet Take 1,000 mcg by mouth daily.     vitamin C (ASCORBIC ACID) 250 MG tablet Take 250 mg by mouth daily.     No current facility-administered medications for this visit.    PHYSICAL EXAMINATION: ECOG PERFORMANCE STATUS: 1 - Symptomatic but completely ambulatory  Vitals:   07/11/22 1341  BP: (!) 177/80  Pulse: 85  Resp: 17  Temp: (!) 97.3 F (36.3 C)  SpO2: 100%   Filed Weights   07/11/22 1341  Weight: 150 lb 9 oz (68.3 kg)      LABORATORY DATA:  I have reviewed the data as listed    Latest Ref Rng & Units 08/29/2021    3:30 PM 08/02/2021    2:07 PM 07/12/2021   12:47 PM  CMP  Glucose 70 - 99 mg/dL 104  95  107  BUN 8 - 23 mg/dL '11  14  17   '$ Creatinine 0.44 - 1.00 mg/dL 1.12  1.39  1.07   Sodium 135 - 145 mmol/L 142  138  139   Potassium 3.5 - 5.1 mmol/L 4.4  3.9  3.7   Chloride 98 - 111 mmol/L 104  103  103   CO2 22 - 32 mmol/L '30  28  29   '$ Calcium 8.9 - 10.3 mg/dL 9.9  9.5  9.8   Total Protein 6.5 - 8.1 g/dL  7.4  7.5   Total Bilirubin 0.3 - 1.2 mg/dL  0.3  0.3   Alkaline Phos 38 - 126 U/L  54  66   AST 15 - 41 U/L  28  20   ALT 0 - 44 U/L  26  18     Lab Results  Component Value Date   WBC 3.5 (L) 08/02/2021   HGB 10.4 (L) 08/02/2021   HCT 31.0 (L) 08/02/2021   MCV 89.9 08/02/2021   PLT 214 08/02/2021   NEUTROABS 2.0 08/02/2021    ASSESSMENT & PLAN:  Malignant neoplasm of upper-outer quadrant of right breast in female,  estrogen receptor negative ([place]) 06/20/2017: Right breast cancer T1BN0 stage Ia grade 3, ER/PR negative, HER2 +0/5 lymph nodes negative (genetics negative), adjuvant chemo with Taxol Herceptin with Herceptin maintenance for 1 year, adjuvant radiation 06/15/2020: Left breast cancer T1N0 grade 3 IDC, weak ER positive, PR negative, biopsy had HER2 amplified with a Ki-67 of 45%, status post left lumpectomy: T1CN0 stage Ib grade 3, 0/5 lymph nodes (HER2 negative on final path) Adjuvant chemo with CMF Herceptin followed by Herceptin maintenance completed February 2023, XRT   Treatment plan: Adjuvant antiestrogen therapy with  anastrozole 1 mg daily  started 02/2021 Anastrozole toxicities: It appears that patient has a recurrence of DCIS in spite of taking anastrozole therapy.  Breast Cancer Surveillance:  Breast Exam: 07/11/22: Benign Mammograms 06/29/22: new 0.3 cm calcs Right breast central to nipple middle depth suspicious for malignancy (biopsy recommended), 1 cm pleomorphic calcs left breast suspicious, total span 3.8 cm 3. Left breast Biopsy: HG DCIS, focally suspicious for microinvasion ER 80%, PR 0%; Right Breast biopsy: CSL, focal necrosis 4.  Surgical options: Bilateral lumpectomies versus bilateral mastectomies versus left-sided mastectomy and right-sided lumpectomy. She will discuss with Dr. Donne Hazel and make a final decision.  RTC after surgery to discuss antiestrogen therapy.  We may switch her from anastrozole to letrozole.     No orders of the defined types were placed in this encounter.  The patient has a good understanding of the overall plan. she agrees with it. she will call with any problems that may develop before the next visit here. Total time spent: 30 mins including face to face time and time spent for planning, charting and co-ordination of care   Harriette Ohara, MD 07/11/22    I Gardiner Coins am acting as a Education administrator for [employer]  I have reviewed the above  documentation for accuracy and completeness, and I agree with the above.

## 2022-07-12 ENCOUNTER — Other Ambulatory Visit: Payer: Self-pay | Admitting: General Surgery

## 2022-07-12 DIAGNOSIS — D0512 Intraductal carcinoma in situ of left breast: Secondary | ICD-10-CM

## 2022-07-12 DIAGNOSIS — N6489 Other specified disorders of breast: Secondary | ICD-10-CM

## 2022-07-14 ENCOUNTER — Telehealth: Payer: Self-pay | Admitting: Hematology and Oncology

## 2022-07-14 ENCOUNTER — Encounter: Payer: Self-pay | Admitting: Hematology and Oncology

## 2022-07-14 ENCOUNTER — Encounter: Payer: Self-pay | Admitting: *Deleted

## 2022-07-14 NOTE — Telephone Encounter (Signed)
Scheduled per inbasket message, patient has been called and notified.

## 2022-07-19 ENCOUNTER — Other Ambulatory Visit: Payer: Self-pay | Admitting: General Surgery

## 2022-07-19 DIAGNOSIS — D0512 Intraductal carcinoma in situ of left breast: Secondary | ICD-10-CM

## 2022-07-25 NOTE — Progress Notes (Signed)
Surgical Instructions    Your procedure is scheduled on Thursday, February 15.  Report to [facility] Main Entrance "A" at 5:30 A.M., then check in with the Admitting office.  Call this number if you have problems the morning of surgery:  [phone]   If you have any questions prior to your surgery date call [phone]: Open Monday-Friday 8am-4pm If you experience any cold or flu symptoms such as cough, fever, chills, shortness of breath, etc. between now and your scheduled surgery, please notify us at the above number     Remember:  Do not eat after midnight the night before your surgery  You may drink clear liquids until 4:30AM the morning of your surgery.   Clear liquids allowed are: Water, Non-Citrus Juices (without pulp), Carbonated Beverages, Clear Tea, Black Coffee ONLY (NO MILK, CREAM OR POWDERED CREAMER of any kind), and Gatorade  Patient Instructions  The night before surgery:  No food after midnight. ONLY clear liquids after midnight  The day of surgery (if you do NOT have diabetes):  Drink ONE (1) Pre-Surgery Clear Ensure by 4:30AM the morning of surgery. Drink in one sitting. Do not sip.  This drink was given to you during your hospital  pre-op appointment visit.  Nothing else to drink after completing the  Pre-Surgery Clear Ensure.           If you have questions, please contact your surgeon's office.     Take these medicines the morning of surgery with A SIP OF WATER:  anastrozole (ARIMIDEX) 1   Follow your surgeon's instructions on when to stop Aspirin.  If no instructions were given by your surgeon then you will need to call the office to get those instructions.     As of today, STOP taking any Aleve, Naproxen, Ibuprofen, Motrin, Advil, Goody's, BC's, all herbal medications, fish oil, and all vitamins.             [facility] is not responsible for any belongings or valuables.    Do NOT Smoke (Tobacco/Vaping)  24 hours prior to your procedure  If  you use a CPAP at night, you may bring your mask for your overnight stay.   Contacts, glasses, hearing aids, dentures or partials may not be worn into surgery, please bring cases for these belongings   For patients admitted to the hospital, discharge time will be determined by your treatment team.   Patients discharged the day of surgery will not be allowed to drive home, and someone needs to stay with them for 24 hours.   SURGICAL WAITING ROOM VISITATION Patients having surgery or a procedure may have no more than 2 support people in the waiting area - these visitors may rotate.   Children under the age of 26 must have an adult with them who is not the patient. If the patient needs to stay at the hospital during part of their recovery, the visitor guidelines for inpatient rooms apply. Pre-op nurse will coordinate an appropriate time for 1 support person to accompany patient in pre-op.  This support person may not rotate.   Please refer to [email or web address] for the visitor guidelines for Inpatients (after your surgery is over and you are in a regular room).    Special instructions:    Oral Hygiene is also important to reduce your risk of infection.  Remember - BRUSH YOUR TEETH THE MORNING OF SURGERY WITH YOUR REGULAR TOOTHPASTE   [place]- Preparing For Surgery  Before surgery, you can play  an important role. Because skin is not sterile, your skin needs to be as free of germs as possible. You can reduce the number of germs on your skin by washing with CHG (chlorahexidine gluconate) Soap before surgery.  CHG is an antiseptic cleaner which kills germs and bonds with the skin to continue killing germs even after washing.     Please do not use if you have an allergy to CHG or antibacterial soaps. If your skin becomes reddened/irritated stop using the CHG.  Do not shave (including legs and underarms) for at least 48 hours prior to first  CHG shower. It is OK to shave your face.  Please follow these instructions carefully.     Shower the NIGHT BEFORE SURGERY and the MORNING OF SURGERY with CHG Soap.   If you chose to wash your hair, wash your hair first as usual with your normal shampoo. After you shampoo, rinse your hair and body thoroughly to remove the shampoo.  Then [employer] and genitals (private parts) with your normal soap and rinse thoroughly to remove soap.  After that Use CHG Soap as you would any other liquid soap. You can apply CHG directly to the skin and wash gently with a scrungie or a clean washcloth.   Apply the CHG Soap to your body ONLY FROM THE NECK DOWN.  Do not use on open wounds or open sores. Avoid contact with your eyes, ears, mouth and genitals (private parts). Wash Face and genitals (private parts)  with your normal soap.   Wash thoroughly, paying special attention to the area where your surgery will be performed.  Thoroughly rinse your body with warm water from the neck down.  DO NOT shower/wash with your normal soap after using and rinsing off the CHG Soap.  Pat yourself dry with a CLEAN TOWEL.  Wear CLEAN PAJAMAS to bed the night before surgery  Place CLEAN SHEETS on your bed the night before your surgery  DO NOT SLEEP WITH PETS.   Day of Surgery:  Take a shower with CHG soap. Wear Clean/Comfortable clothing the morning of surgery Do not wear jewelry or makeup. Do not wear lotions, powders, perfumes/cologne or deodorant. Do not shave 48 hours prior to surgery.  Men may shave face and neck. Do not bring valuables to the hospital. Do not wear nail polish, gel polish, artificial nails, or any other type of covering on natural nails (fingers and toes) If you have artificial nails or gel coating that need to be removed by a nail salon, please have this removed prior to surgery. Artificial nails or gel coating may interfere with anesthesia's ability to adequately monitor your vital  signs. Remember to brush your teeth WITH YOUR REGULAR TOOTHPASTE.    If you received a COVID test during your pre-op visit, it is requested that you wear a mask when out in public, stay away from anyone that may not be feeling well, and notify your surgeon if you develop symptoms. If you have been in contact with anyone that has tested positive in the last 10 days, please notify your surgeon.    Please read over the following fact sheets that you were given.

## 2022-07-26 ENCOUNTER — Other Ambulatory Visit: Payer: Self-pay

## 2022-07-26 ENCOUNTER — Encounter (HOSPITAL_COMMUNITY): Payer: Self-pay

## 2022-07-26 ENCOUNTER — Encounter (HOSPITAL_COMMUNITY)
Admission: RE | Admit: 2022-07-26 | Discharge: 2022-07-26 | Disposition: A | Discharge status: Home / Self Care | Payer: Medicare PPO | Source: Ambulatory Visit | Attending: General Surgery | Admitting: General Surgery

## 2022-07-26 VITALS — BP 190/85 | HR 88 | Temp 98.0°F | Resp 17 | Ht 63.5 in | Wt 152.0 lb

## 2022-07-26 DIAGNOSIS — I10 Essential (primary) hypertension: Secondary | ICD-10-CM | POA: Diagnosis not present

## 2022-07-26 DIAGNOSIS — Z01812 Encounter for preprocedural laboratory examination: Secondary | ICD-10-CM | POA: Diagnosis present

## 2022-07-26 LAB — BASIC METABOLIC PANEL
Anion gap: 8 (ref 5–15)
BUN: 14 mg/dL (ref 8–23)
CO2: 29 mmol/L (ref 22–32)
Calcium: 10.1 mg/dL (ref 8.9–10.3)
Chloride: 102 mmol/L (ref 98–111)
Creatinine, Ser: 0.89 mg/dL (ref 0.44–1.00)
GFR, Estimated: >60 mL/min (ref >60)
Glucose, Bld: 102 mg/dL — ABNORMAL HIGH (ref 70–99)
Potassium: 4 mmol/L (ref 3.5–5.1)
Sodium: 139 mmol/L (ref 135–145)

## 2022-07-26 LAB — CBC
HCT: 37.3 % (ref 36.0–46.0)
Hemoglobin: 11.7 g/dL — ABNORMAL LOW (ref 12.0–15.0)
MCH: 28.9 pg (ref 26.0–34.0)
MCHC: 31.4 g/dL (ref 30.0–36.0)
MCV: 92.1 fL (ref 80.0–100.0)
Platelets: 246 10*3/uL (ref 150–400)
RBC: 4.05 MIL/uL (ref 3.87–5.11)
RDW: 13.2 % (ref 11.5–15.5)
WBC: 4.7 10*3/uL (ref 4.0–10.5)
nRBC: 0 % (ref 0.0–0.2)

## 2022-07-26 NOTE — Progress Notes (Signed)
PCP - [name], MD Cardiologist - Dr. Haroldine Laws saw him d/t medication she was taking herceptin. Just was monitored.   PPM/ICD - Denies Device Orders -  Rep Notified -   Chest x-ray - N/I EKG - 08/29/21 Stress Test - Denies ECHO - 06/01/21 Cardiac Cath - Denies  Sleep Study - Denies  DM - Denies  Blood Thinner Instructions: N/A Aspirin Instructions:Requested patient to call surgeon's office for instruction when to stop.   ERAS Protcol - Yes PRE-SURGERY Ensure given   COVID TEST- N/I   Anesthesia review: Yes getting seed placed.  Patient denies shortness of breath, fever, cough and chest pain at PAT appointment   All instructions explained to the patient, with a verbal understanding of the material. Patient agrees to go over the instructions while at home for a better understanding.  The opportunity to ask questions was provided.

## 2022-08-02 NOTE — Anesthesia Preprocedure Evaluation (Signed)
Anesthesia Evaluation  Patient identified by MRN, date of birth, ID band Patient awake    Reviewed: Allergy & Precautions, NPO status , Patient's Chart, lab work & pertinent test results  Airway Mallampati: II  TM Distance: >3 FB Neck ROM: Full    Dental no notable dental hx. (+) Teeth Intact   Pulmonary    Pulmonary exam normal breath sounds clear to auscultation       Cardiovascular hypertension, Normal cardiovascular exam Rhythm:Regular Rate:Normal  05/2021 Echo 1. Left ventricular ejection fraction, by estimation, is 60 to 65%. The  left ventricle has normal function. The left ventricle has no regional  wall motion abnormalities. Left ventricular diastolic parameters were  normal. The average left ventricular  global longitudinal strain is -21.8 %. The global longitudinal strain is  normal.   2. Right ventricular systolic function is normal. The right ventricular  size is normal. There is normal pulmonary artery systolic pressure. The  estimated right ventricular systolic pressure is [id number] mmHg.   3. The mitral valve is normal in structure. Trivial mitral valve  regurgitation. No evidence of mitral stenosis.   4. The aortic valve is tricuspid. Aortic valve regurgitation is trivial.   5. The inferior vena cava is normal in size with greater than 50%  respiratory variability, suggesting right atrial pressure of 3 mmHg.     Neuro/Psych TIA   GI/Hepatic negative GI ROS, Neg liver ROS,,,  Endo/Other  negative endocrine ROS    Renal/GU negative Renal ROSLab Results      Component                Value               Date                      CREATININE               0.89                07/26/2022                      K                        4.0                 07/26/2022                 Musculoskeletal   Abdominal   Peds  Hematology Lab Results      Component                Value               Date                              HGB                      11.7 (L)            07/26/2022                HCT                      37.3                07/26/2022  PLT                      246                 07/26/2022              Anesthesia Other Findings All: See list  Reproductive/Obstetrics                             Anesthesia Physical Anesthesia Plan  ASA: 3  Anesthesia Plan: General   Post-op Pain Management: Precedex and Tylenol PO (pre-op)*   Induction: Intravenous  PONV Risk Score and Plan: 4 or greater and Treatment may vary due to age or medical condition, TIVA and Propofol infusion  Airway Management Planned: LMA  Additional Equipment: None  Intra-op Plan:   Post-operative Plan:   Informed Consent: I have reviewed the patients History and Physical, chart, labs and discussed the procedure including the risks, benefits and alternatives for the proposed anesthesia with the patient or authorized representative who has indicated his/her understanding and acceptance.     Dental advisory given  Plan Discussed with: CRNA and Anesthesiologist  Anesthesia Plan Comments: (LMA w TIVA GA)        Anesthesia Quick Evaluation

## 2022-08-03 ENCOUNTER — Other Ambulatory Visit: Payer: Self-pay

## 2022-08-03 ENCOUNTER — Ambulatory Visit (HOSPITAL_BASED_OUTPATIENT_CLINIC_OR_DEPARTMENT_OTHER): Payer: Medicare PPO | Admitting: Certified Registered Nurse Anesthetist

## 2022-08-03 ENCOUNTER — Encounter (HOSPITAL_COMMUNITY): Payer: Self-pay | Admitting: General Surgery

## 2022-08-03 ENCOUNTER — Ambulatory Visit (HOSPITAL_COMMUNITY)
Admission: RE | Admit: 2022-08-03 | Discharge: 2022-08-03 | Disposition: A | Discharge status: Home / Self Care | Payer: Medicare PPO | Source: Ambulatory Visit | Attending: General Surgery | Admitting: General Surgery

## 2022-08-03 ENCOUNTER — Ambulatory Visit (HOSPITAL_COMMUNITY): Payer: Medicare PPO | Admitting: Physician Assistant

## 2022-08-03 ENCOUNTER — Encounter (HOSPITAL_COMMUNITY)
Admission: RE | Disposition: A | Discharge status: Home / Self Care | Payer: Self-pay | Source: Ambulatory Visit | Attending: General Surgery

## 2022-08-03 DIAGNOSIS — Z79811 Long term (current) use of aromatase inhibitors: Secondary | ICD-10-CM | POA: Diagnosis not present

## 2022-08-03 DIAGNOSIS — Z170 Estrogen receptor positive status [ER+]: Secondary | ICD-10-CM | POA: Insufficient documentation

## 2022-08-03 DIAGNOSIS — N6489 Other specified disorders of breast: Secondary | ICD-10-CM

## 2022-08-03 DIAGNOSIS — D0512 Intraductal carcinoma in situ of left breast: Secondary | ICD-10-CM | POA: Insufficient documentation

## 2022-08-03 DIAGNOSIS — Z8673 Personal history of transient ischemic attack (TIA), and cerebral infarction without residual deficits: Secondary | ICD-10-CM | POA: Diagnosis not present

## 2022-08-03 DIAGNOSIS — I10 Essential (primary) hypertension: Secondary | ICD-10-CM

## 2022-08-03 DIAGNOSIS — Z9221 Personal history of antineoplastic chemotherapy: Secondary | ICD-10-CM | POA: Diagnosis not present

## 2022-08-03 DIAGNOSIS — Z923 Personal history of irradiation: Secondary | ICD-10-CM | POA: Insufficient documentation

## 2022-08-03 HISTORY — PX: RADIOACTIVE SEED GUIDED EXCISIONAL BREAST BIOPSY: SHX6490

## 2022-08-03 HISTORY — PX: BREAST LUMPECTOMY WITH RADIOACTIVE SEED LOCALIZATION: SHX6424

## 2022-08-03 SURGERY — BREAST LUMPECTOMY WITH RADIOACTIVE SEED LOCALIZATION
Anesthesia: General | Site: Breast | Laterality: Right

## 2022-08-03 MED ORDER — SODIUM CHLORIDE 0.9% FLUSH: 3.0000 mL | INTRAVENOUS | Status: DC | PRN

## 2022-08-03 MED ORDER — CHLORHEXIDINE GLUCONATE CLOTH 2 % EX PADS: 6.0000 | MEDICATED_PAD | Freq: Once | CUTANEOUS | Status: DC

## 2022-08-03 MED ORDER — OXYCODONE HCL 5 MG PO TABS
ORAL_TABLET | ORAL | Status: AC
  Filled 2022-08-03: qty 1

## 2022-08-03 MED ORDER — ACETAMINOPHEN 650 MG RE SUPP: 650.0000 mg | RECTAL | Status: DC | PRN

## 2022-08-03 MED ORDER — ACETAMINOPHEN 10 MG/ML IV SOLN: 1000.0000 mg | Freq: Once | INTRAVENOUS | Status: DC | PRN

## 2022-08-03 MED ORDER — SODIUM CHLORIDE 0.9 % IV SOLN: 250.0000 mL | INTRAVENOUS | Status: DC | PRN

## 2022-08-03 MED ORDER — ONDANSETRON HCL 4 MG/2ML IJ SOLN: 4.0000 mg | Freq: Once | INTRAMUSCULAR | Status: DC | PRN

## 2022-08-03 MED ORDER — ACETAMINOPHEN 325 MG PO TABS: 650.0000 mg | ORAL_TABLET | ORAL | Status: DC | PRN

## 2022-08-03 MED ORDER — PROPOFOL 10 MG/ML IV BOLUS
INTRAVENOUS | Status: DC | PRN
  Administered 2022-08-03: 120 mg via INTRAVENOUS
  Administered 2022-08-03: 30 mg via INTRAVENOUS

## 2022-08-03 MED ORDER — CHLORHEXIDINE GLUCONATE 0.12 % MT SOLN
15.0000 mL | Freq: Once | OROMUCOSAL | Status: AC
  Administered 2022-08-03: 15 mL via OROMUCOSAL
  Filled 2022-08-03: qty 15

## 2022-08-03 MED ORDER — FENTANYL CITRATE (PF) 250 MCG/5ML IJ SOLN
INTRAMUSCULAR | Status: DC | PRN
  Administered 2022-08-03: 25 ug via INTRAVENOUS
  Administered 2022-08-03: 50 ug via INTRAVENOUS

## 2022-08-03 MED ORDER — PHENYLEPHRINE 80 MCG/ML (10ML) SYRINGE FOR IV PUSH (FOR BLOOD PRESSURE SUPPORT)
PREFILLED_SYRINGE | INTRAVENOUS | Status: DC | PRN
  Administered 2022-08-03 (×2): 160 ug via INTRAVENOUS
  Administered 2022-08-03: 80 ug via INTRAVENOUS

## 2022-08-03 MED ORDER — DEXMEDETOMIDINE HCL IN NACL 80 MCG/20ML IV SOLN
INTRAVENOUS | Status: DC | PRN
  Administered 2022-08-03: 8 ug via BUCCAL

## 2022-08-03 MED ORDER — BUPIVACAINE HCL 0.25 % IJ SOLN
INTRAMUSCULAR | Status: DC | PRN
  Administered 2022-08-03: 20 mL

## 2022-08-03 MED ORDER — EPHEDRINE SULFATE-NACL 50-0.9 MG/10ML-% IV SOSY
PREFILLED_SYRINGE | INTRAVENOUS | Status: DC | PRN
  Administered 2022-08-03 (×2): 5 mg via INTRAVENOUS
  Administered 2022-08-03: 2.5 mg via INTRAVENOUS

## 2022-08-03 MED ORDER — BUPIVACAINE HCL (PF) 0.25 % IJ SOLN
INTRAMUSCULAR | Status: AC
  Filled 2022-08-03: qty 30

## 2022-08-03 MED ORDER — ENSURE PRE-SURGERY PO LIQD: 296.0000 mL | Freq: Once | ORAL | Status: DC

## 2022-08-03 MED ORDER — MAGTRACE LYMPHATIC TRACER
INTRAMUSCULAR | Status: DC | PRN
  Administered 2022-08-03: 2 mL via INTRAMUSCULAR

## 2022-08-03 MED ORDER — CEFAZOLIN SODIUM-DEXTROSE 2-4 GM/100ML-% IV SOLN: 2.0000 g | INTRAVENOUS | Status: DC

## 2022-08-03 MED ORDER — FENTANYL CITRATE (PF) 100 MCG/2ML IJ SOLN: 25.0000 ug | INTRAMUSCULAR | Status: DC | PRN

## 2022-08-03 MED ORDER — ACETAMINOPHEN 500 MG PO TABS
1000.0000 mg | ORAL_TABLET | ORAL | Status: AC
  Administered 2022-08-03: 1000 mg via ORAL
  Filled 2022-08-03: qty 2

## 2022-08-03 MED ORDER — PROPOFOL 10 MG/ML IV BOLUS
INTRAVENOUS | Status: AC
  Filled 2022-08-03: qty 20

## 2022-08-03 MED ORDER — OXYCODONE HCL 5 MG PO TABS
5.0000 mg | ORAL_TABLET | ORAL | Status: DC | PRN
  Administered 2022-08-03: 5 mg via ORAL

## 2022-08-03 MED ORDER — CEFAZOLIN SODIUM-DEXTROSE 2-4 GM/100ML-% IV SOLN
2.0000 g | INTRAVENOUS | Status: AC
  Administered 2022-08-03: 2 g via INTRAVENOUS
  Filled 2022-08-03: qty 100

## 2022-08-03 MED ORDER — CHLORHEXIDINE GLUCONATE CLOTH 2 % EX PADS
6.0000 | MEDICATED_PAD | Freq: Once | CUTANEOUS | Status: AC
  Administered 2022-08-03: 6 via TOPICAL

## 2022-08-03 MED ORDER — ORAL CARE MOUTH RINSE: 15.0000 mL | Freq: Once | OROMUCOSAL | Status: AC

## 2022-08-03 MED ORDER — LIDOCAINE 2% (20 MG/ML) 5 ML SYRINGE
INTRAMUSCULAR | Status: DC | PRN
  Administered 2022-08-03: 100 mg via INTRAVENOUS

## 2022-08-03 MED ORDER — LACTATED RINGERS IV SOLN: INTRAVENOUS | Status: DC

## 2022-08-03 MED ORDER — ONDANSETRON HCL 4 MG/2ML IJ SOLN
INTRAMUSCULAR | Status: DC | PRN
  Administered 2022-08-03: 4 mg via INTRAVENOUS

## 2022-08-03 MED ORDER — 0.9 % SODIUM CHLORIDE (POUR BTL) OPTIME
TOPICAL | Status: DC | PRN
  Administered 2022-08-03: 1000 mL

## 2022-08-03 MED ORDER — SODIUM CHLORIDE 0.9 % IV SOLN: INTRAVENOUS | Status: DC

## 2022-08-03 MED ORDER — PROPOFOL 500 MG/50ML IV EMUL
INTRAVENOUS | Status: DC | PRN
  Administered 2022-08-03: 125 ug/kg/min via INTRAVENOUS

## 2022-08-03 MED ORDER — TRAMADOL HCL 50 MG PO TABS: 50.0000 mg | ORAL_TABLET | Freq: Four times a day (QID) | ORAL | 0 refills | Status: DC | PRN

## 2022-08-03 MED ORDER — FENTANYL CITRATE (PF) 250 MCG/5ML IJ SOLN
INTRAMUSCULAR | Status: AC
  Filled 2022-08-03: qty 5

## 2022-08-03 SURGICAL SUPPLY — 41 items
ADH SKN CLS APL DERMABOND .7 (GAUZE/BANDAGES/DRESSINGS) ×2
APL PRP STRL LF DISP 70% ISPRP (MISCELLANEOUS) ×1
APPLIER CLIP 9.375 MED OPEN (MISCELLANEOUS) ×2
APR CLP MED 9.3 20 MLT OPN (MISCELLANEOUS) ×1
BAG COUNTER SPONGE SURGICOUNT (BAG) ×2 IMPLANT
BAG SPNG CNTER NS LX DISP (BAG) ×2
BINDER BREAST XLRG (GAUZE/BANDAGES/DRESSINGS) IMPLANT
CANISTER SUCT 3000ML PPV (MISCELLANEOUS) ×2 IMPLANT
CHLORAPREP W/TINT 26 (MISCELLANEOUS) ×2 IMPLANT
CLIP APPLIE 9.375 MED OPEN (MISCELLANEOUS) IMPLANT
CLIP VESOCCLUDE MED 6/CT (CLIP) ×2 IMPLANT
CLSR STERI-STRIP ANTIMIC 1/2X4 (GAUZE/BANDAGES/DRESSINGS) IMPLANT
COVER PROBE W GEL 5X96 (DRAPES) ×2 IMPLANT
COVER SURGICAL LIGHT HANDLE (MISCELLANEOUS) ×2 IMPLANT
DERMABOND ADVANCED .7 DNX12 (GAUZE/BANDAGES/DRESSINGS) ×2 IMPLANT
DEVICE DUBIN SPECIMEN MAMMOGRA (MISCELLANEOUS) ×2 IMPLANT
DRAPE CHEST BREAST 15X10 FENES (DRAPES) ×2 IMPLANT
ELECT COATED BLADE 2.86 ST (ELECTRODE) ×2 IMPLANT
ELECT REM PT RETURN 9FT ADLT (ELECTROSURGICAL) ×2
ELECTRODE REM PT RTRN 9FT ADLT (ELECTROSURGICAL) ×2 IMPLANT
GLOVE BIO SURGEON STRL SZ7 (GLOVE) ×4 IMPLANT
GLOVE BIOGEL PI IND STRL 7.5 (GLOVE) ×2 IMPLANT
GOWN STRL REUS W/ TWL LRG LVL3 (GOWN DISPOSABLE) ×4 IMPLANT
GOWN STRL REUS W/TWL LRG LVL3 (GOWN DISPOSABLE) ×4
KIT BASIN OR (CUSTOM PROCEDURE TRAY) ×2 IMPLANT
KIT MARKER MARGIN INK (KITS) ×2 IMPLANT
LIGHT WAVEGUIDE WIDE FLAT (MISCELLANEOUS) IMPLANT
NDL HYPO 25GX1X1/2 BEV (NEEDLE) ×2 IMPLANT
NEEDLE HYPO 25GX1X1/2 BEV (NEEDLE) ×2 IMPLANT
NS IRRIG 1000ML POUR BTL (IV SOLUTION) ×2 IMPLANT
PACK GENERAL/GYN (CUSTOM PROCEDURE TRAY) ×2 IMPLANT
STRIP CLOSURE SKIN 1/2X4 (GAUZE/BANDAGES/DRESSINGS) ×2 IMPLANT
SUT MNCRL AB 4-0 PS2 18 (SUTURE) ×2 IMPLANT
SUT MON AB 5-0 PS2 18 (SUTURE) IMPLANT
SUT SILK 2 0 SH (SUTURE) IMPLANT
SUT VIC AB 2-0 SH 27 (SUTURE) ×8
SUT VIC AB 2-0 SH 27XBRD (SUTURE) ×2 IMPLANT
SUT VIC AB 3-0 SH 27 (SUTURE) ×4
SUT VIC AB 3-0 SH 27X BRD (SUTURE) ×2 IMPLANT
SYR CONTROL 10ML LL (SYRINGE) ×2 IMPLANT
TOWEL GREEN STERILE (TOWEL DISPOSABLE) ×2 IMPLANT

## 2022-08-03 NOTE — Interval H&P Note (Signed)
History and Physical Interval Note:  08/03/2022 7:44 AM  April [name]  has presented today for surgery, with the diagnosis of LEFT BREAST DCIS AND RIGHT BREAST RADIAL SCAR.  The various methods of treatment have been discussed with the patient and family. After consideration of risks, benefits and other options for treatment, the patient has consented to  Procedure(s): LEFT BREAST BRACKETED LUMPECTOMY WITH RADIOACTIVE SEED LOCALIZATION (Left) RADIOACTIVE SEED GUIDED EXCISIONAL RIGHT BREAST BIOPSY (Right) as a surgical intervention.  The patient's history has been reviewed, patient examined, no change in status, stable for surgery.  I have reviewed the patient's chart and labs.  Questions were answered to the patient's satisfaction.     Rolm Bookbinder

## 2022-08-03 NOTE — H&P (Signed)
72 y.o. female s/p right lump/sn and port placement in 2019. she had a 1.6 cm grade II IDC with negative margins. 5 sn negative. er/pr negative and her2 positive with ki of 30. genetics was negative. she then underwent adjuvant chemotherapy. she completed trastumuzab. she completed radiotherapy as well in May 2019. she then got regular mammogram with right side noted to have postop changes and the left side had a cluster of calcs. axilla was negative. she underwent core biopsy that shows idc with hg dcis and LVI that is er pos at 98, pr negative, her 2 positive and Ki is 45%.I returned her to OR for left lumpectomy/sn biopsy/port. This is t1cn0 tumor with weak er pos, pr neg, her 2 amplified on core. She has done cmf and did herceptin maintenance. She has completed radiotherapy as well and on anastrozole. she has no complaints. She underwent screening mm that shows 3 mm of calcifications on the right side. There is a 1 cm area of pleomorphic calcs on the left side. There is larger 3.8 cm area on left side. Biopsy of the right side is a csl. Biopsy of the left side is hg dcis, suspicious microinvasion, er pos at 34, pr negative. She is here to discuss options  Review of Systems: A complete review of systems was obtained from the patient. I have reviewed this information and discussed as appropriate with the patient. See HPI as well for other ROS.  Review of Systems  All other systems reviewed and are negative.  Medical History: Past Medical History:  Diagnosis Date  History of cancer  Hyperlipidemia  Hypertension   Patient Active Problem List  Diagnosis  Malignant neoplasm of left breast, estrogen receptor positive  Malignant neoplasm of upper-outer quadrant of right breast in female, estrogen receptor negative  Personal history of transient ischemic attack (TIA), and cerebral infarction without residual deficits   Past Surgical History:  Procedure Laterality Date  CESAREAN SECTION  DEEP  AXILLARY SENTINEL NODE BIOPSY / EXCISION Bilateral  MASTECTOMY PARTIAL / LUMPECTOMY Bilateral  2   Allergies  Allergen Reactions  Betamethasone Dipropionate Other (See Comments)  Cetirizine Hcl Other (See Comments)  Diphenhydramine Hcl Swelling  Patient reports "I have taken it since the red ant bites and had no swelling/hives with the Benadryl.  Lisinopril Other (See Comments)  Loratadine Other (See Comments) and Swelling  Neosporin [Hydrocortisone] Rash  Nsaids (Non-Steroidal Anti-Inflammatory Drug) Other (See Comments)  Stomach issue  Adhesive Rash  Steri strips gave pt a blister and rash  Sulfa (Sulfonamide Antibiotics) Rash   Current Outpatient Medications on File Prior to Visit  Medication Sig Dispense Refill  anastrozole (ARIMIDEX) 1 mg tablet Take 1 mg by mouth once daily  ascorbic acid, vitamin C, (VITAMIN C) 250 MG tablet Take by mouth  aspirin 81 MG EC tablet Take 81 mg by mouth once daily  calcium carbonate-vitamin D3 500 mg-5 mcg (200 unit) PwPk Take by mouth  omega-3 fatty acids/fish oil (FISH OIL) 340-1,000 mg capsule Take 1 capsule by mouth once daily  pyridoxine, vitamin B6, (B-6) 250 MG tablet 1 tablet  RED YEAST RICE EXTRACT, BULK, MISC Take by mouth  venlafaxine (EFFEXOR-XR) 37.5 MG XR capsule Take 1 capsule by mouth daily with breakfast (Patient not taking: Reported on 07/12/2022)    Family History  Problem Relation Age of Onset  Skin cancer Mother  Hyperlipidemia (Elevated cholesterol) Mother  Breast cancer Mother  High blood pressure (Hypertension) Father  Breast cancer Sister   Social History  Tobacco Use  Smoking Status Never  Smokeless Tobacco Never  Tobacco Comments  3 times per year  Marital status: Married  Tobacco Use  Smoking status: Never  Smokeless tobacco: Never  Tobacco comments:  3 times per year  Substance and Sexual Activity  Drug use: Never   Objective:   Vitals:  07/12/22 0833  BP: (!) 142/82  Pulse: 58  Weight: 68.4  kg (150 lb 12.8 oz)  Height: 161.3 cm (5' 3.5")   Body mass index is 26.29 kg/m.  Physical Exam Constitutional:  Appearance: Normal appearance.  Chest:  Breasts: Right: No inverted nipple, mass or nipple discharge.  Left: No inverted nipple, mass or nipple discharge.  Lymphadenopathy:  Upper Body:  Right upper body: No supraclavicular or axillary adenopathy.  Left upper body: No supraclavicular or axillary adenopathy.  Neurological:  Mental Status: She is alert.     Assessment and Plan:   Ductal carcinoma in situ (DCIS) of left breast  Radial scar of breast  Left breast seed guided lumpectomy, right breast seed guided excisional biopsy  We discussed the right side first. Following the right side might be an option but with her history as well as the new area on the left I think it is most reasonable to remove this. We discussed a seed guided excisional biopsy on the right side for the complex sclerosing lesion.  On the left side we discussed the DCIS that is now present. She has a plan with oncology to switch the aromatase inhibitor that she is currently on to letrozole. We discussed the surgical options which include a lumpectomy with a change in the antiestrogen and omitting radiotherapy versus a mastectomy. We had a long conversation about the pros and cons of both of these. I think certainly there is a higher local recurrence rate with we just do a lumpectomy but I do not think this is going to change her survival. After long conversation we elected to proceed with seed guided lumpectomy. We did discuss positive margin rate. This is suspicious for microinvasion so I will plan to inject mag trace just in case we need a sentinel lymph node biopsy at a later date. I am going to plan on scheduling her soon.

## 2022-08-03 NOTE — Transfer of Care (Signed)
Immediate Anesthesia Transfer of Care Note  Patient: April [name]  Procedure(s) Performed: LEFT BREAST BRACKETED LUMPECTOMY WITH RADIOACTIVE SEED LOCALIZATION (Left: Breast) RADIOACTIVE SEED GUIDED EXCISIONAL RIGHT BREAST BIOPSY (Right: Breast)  Patient Location: PACU  Anesthesia Type:General  Level of Consciousness: awake, alert , and oriented  Airway & Oxygen Therapy: Patient Spontanous Breathing  Post-op Assessment: Report given to RN and Post -op Vital signs reviewed and stable  Post vital signs: Reviewed and stable  Last Vitals:  Vitals Value Taken Time  BP 142/64 08/03/22 0937  Temp    Pulse 74 08/03/22 0939  Resp 17 08/03/22 0940  SpO2 100 % 08/03/22 0939  Vitals shown include unvalidated device data.  Last Pain:  Vitals:   08/03/22 0614  TempSrc: Oral  PainSc: 0-No pain         Complications: No notable events documented.

## 2022-08-03 NOTE — Op Note (Signed)
Preoperative diagnosis: right breast radial scar, left breast dcis Postoperative diagnosis: same as above Procedure: Right breast seed guided excisional biopsy Left breast seed bracketed lumpectomy Injection of magtrace left breast for possible delayed sn idenfitication Surgeon: Dr Serita Grammes Anesthesia: general EBL: minimal Complications none Drains none Specimens: Right breast excisional biopsy containing seed and clip Left breast seed bracketed lumpectomy containing two seeds and clip Additional left breast anterior, posterior, lateral, medial, superior, and inferior margins marked short superior, long lateral double deep Sponge and needle count correct Dispo recovery stable  Indications: 72 y.o. female s/p right lump/sn and port placement in 2019. she had a 1.6 cm grade II IDC with negative margins. 5 sn negative. er/pr negative and her2 positive with ki of 30. genetics was negative. she then underwent adjuvant chemotherapy. she completed trastumuzab. she completed radiotherapy as well in May 2019. she then got regular mammogram with right side noted to have postop changes and the left side had a cluster of calcs. axilla was negative. she underwent core biopsy that shows idc with hg dcis and LVI that is er pos at 51, pr negative, her 2 positive and Ki is 45%.I returned her to OR for left lumpectomy/sn biopsy/port. This is t1cn0 tumor with weak er pos, pr neg, her 2 amplified on core. She has done cmf and did herceptin maintenance. She has completed radiotherapy as well and on anastrozole. she has no complaints. She underwent screening mm that shows 3 mm of calcifications on the right side. There is a 1 cm area of pleomorphic calcs on the left side. There is larger 3.8 cm area on left side. Biopsy of the right side is a csl. Biopsy of the left side is hg dcis, suspicious microinvasion, er pos at 22, pr negative. We discussed excisional biopsy on the right and bracketed lumpectomy on the  left.  Procedure: After informed consent was obtained patient was taken to the OR.  She was given antibiotics. SCDs were in place.  She was placed under general anesthesia without complication.  She was prepped and draped in the standard sterile surgical fashion.  A surgical timeout was then performed.  I first injected 2 cc of mag trace on the left side in the subareolar position and massaged this.  This was done in case I need to do a sentinel node in a delayed fashion.  I posted the right excisional biopsy.  I located the seed in the central breast.  I infiltrated Marcaine and made a periareolar incision in order to hide the scar later.  I dissected to the seed.  I remove the seed in the surrounding tissue to include the clip.  This was confirmed by mammography.  Hemostasis was observed.  I closed this with 2-0 Vicryl, 3-0 Vicryl, and 5-0 Monocryl.  Glue and Steri-Strips were eventually applied.  I then made a left side of the periareolar incision.  I used the neoprobe to guide excision of the 2 seeds and the intervening tissue.  I confirmed removal of the clip as well as the 2 seeds on mammography.  Due to the DCIS and where these were close I elected to shave the cavity on all sides.  I then obtained hemostasis.  I closed this with 2-0 Vicryl, 3-0 Vicryl, 5-0 Monocryl.  Glue Steri-Strips were applied.  She tolerated this well was extubated transferred recovery stable.

## 2022-08-03 NOTE — Anesthesia Procedure Notes (Signed)
Procedure Name: LMA Insertion Date/Time: 08/03/2022 8:03 AM  Performed by: Dorthea Cove, CRNAPre-anesthesia Checklist: Patient identified, Emergency Drugs available, Suction available and Patient being monitored Patient Re-evaluated:Patient Re-evaluated prior to induction Oxygen Delivery Method: Circle System Utilized Preoxygenation: Pre-oxygenation with 100% oxygen Induction Type: IV induction Ventilation: Mask ventilation without difficulty LMA: LMA inserted LMA Size: 4.0 Number of attempts: 1 Airway Equipment and Method: Bite block Placement Confirmation: positive ETCO2 Tube secured with: Tape Dental Injury: Teeth and Oropharynx as per pre-operative assessment

## 2022-08-03 NOTE — Anesthesia Postprocedure Evaluation (Signed)
Anesthesia Post Note  Patient: RAJAH SERA  Procedure(s) Performed: LEFT BREAST BRACKETED LUMPECTOMY WITH RADIOACTIVE SEED LOCALIZATION (Left: Breast) RADIOACTIVE SEED GUIDED EXCISIONAL RIGHT BREAST BIOPSY (Right: Breast)     Patient location during evaluation: PACU Anesthesia Type: General Level of consciousness: awake and alert Pain management: pain level controlled Vital Signs Assessment: post-procedure vital signs reviewed and stable Respiratory status: spontaneous breathing, nonlabored ventilation, respiratory function stable and patient connected to nasal cannula oxygen Cardiovascular status: blood pressure returned to baseline and stable Postop Assessment: no apparent nausea or vomiting Anesthetic complications: no  No notable events documented.  Last Vitals:  Vitals:   08/03/22 0945 08/03/22 1000  BP: (!) 148/71 (!) 163/64  Pulse: 74 67  Resp: 20 15  Temp:  36.6 C  SpO2: 99% 100%    Last Pain:  Vitals:   08/03/22 1000  TempSrc:   PainSc: 4                  Barnet Glasgow

## 2022-08-03 NOTE — Discharge Instructions (Addendum)
[facility] Office Phone Number [phone]  POST OP INSTRUCTIONS Take 400 mg of ibuprofen every 8 hours or 650 mg tylenol every 6 hours for next 72 hours then as needed. Use ice several times daily also.  A prescription for pain medication may be given to you upon discharge.  Take your pain medication as prescribed, if needed.  If narcotic pain medicine is not needed, then you may take acetaminophen (Tylenol), naprosyn (Alleve) or ibuprofen (Advil) as needed. Take your usually prescribed medications unless otherwise directed If you need a refill on your pain medication, please contact your pharmacy.  They will contact our office to request authorization.  Prescriptions will not be filled after 5pm or on week-ends. You should eat very light the first 24 hours after surgery, such as soup, crackers, pudding, etc.  Resume your normal diet the day after surgery. Most patients will experience some swelling and bruising in the breast.  Ice packs and a good support bra will help.  Wear the breast binder provided or a sports bra for 72 hours day and night.  After that wear a sports bra during the day until you return to the office. Swelling and bruising can take several days to resolve.  It is common to experience some constipation if taking pain medication after surgery.  Increasing fluid intake and taking a stool softener will usually help or prevent this problem from occurring.  A mild laxative (Milk of Magnesia or Miralax) should be taken according to package directions if there are no bowel movements after 48 hours. I used skin glue on the incision, you may shower in 24 hours.  The glue will flake off over the next 2-3 weeks.  Any sutures or staples will be removed at the office during your follow-up visit. ACTIVITIES:  You may resume regular daily activities (gradually increasing) beginning the next day.  Wearing a good support bra or sports bra minimizes pain and swelling.  You may have  sexual intercourse when it is comfortable. You may drive when you no longer are taking prescription pain medication, you can comfortably wear a seatbelt, and you can safely maneuver your car and apply brakes. RETURN TO WORK:  ______________________________________________________________________________________ You should see your doctor in the office for a follow-up appointment approximately two weeks after your surgery.  Your doctor's nurse will typically make your follow-up appointment when she calls you with your pathology report.  Expect your pathology report 3-4 business days after your surgery.  You may call to check if you do not hear from us after three days. OTHER INSTRUCTIONS: _______________________________________________________________________________________________ _____________________________________________________________________________________________________________________________________ _____________________________________________________________________________________________________________________________________ _____________________________________________________________________________________________________________________________________  WHEN TO CALL DR WAKEFIELD: Fever over 101.0 Nausea and/or vomiting. Extreme swelling or bruising. Continued bleeding from incision. Increased pain, redness, or drainage from the incision.  The clinic staff is available to answer your questions during regular business hours.  Please don't hesitate to call and ask to speak to one of the nurses for clinical concerns.  If you have a medical emergency, go to the nearest emergency room or call 911.  A surgeon from [facility] is always on call at the hospital.  For further questions, please visit [email or web address]  

## 2022-08-04 ENCOUNTER — Encounter (HOSPITAL_COMMUNITY): Payer: Self-pay | Admitting: General Surgery

## 2022-08-07 LAB — SURGICAL PATHOLOGY

## 2022-08-10 ENCOUNTER — Encounter: Payer: Self-pay | Admitting: *Deleted

## 2022-08-10 ENCOUNTER — Encounter (HOSPITAL_COMMUNITY): Payer: Self-pay

## 2022-08-10 NOTE — Progress Notes (Signed)
Patient Care Team: Mckinley Jewel, MD as PCP - General (Internal Medicine) Rolm Bookbinder, MD as Consulting Physician (General Surgery) Eppie Gibson, MD as Attending Physician (Radiation Oncology) Newt Minion, MD as Consulting Physician (Orthopedic Surgery) Regal, Tamala Fothergill, DPM as Consulting Physician (Podiatry) Neldon Mc, Donnamarie Poag, MD as Consulting Physician (Allergy and Immunology) Bensimhon, Shaune Pascal, MD as Consulting Physician (Cardiology) Nicholas Lose, MD as Consulting Physician (Hematology and Oncology) Mauro Kaufmann, RN as Oncology Nurse Navigator Rockwell Germany, RN as Oncology Nurse Navigator  DIAGNOSIS:  Encounter Diagnosis  Name Primary?   Malignant neoplasm of upper-outer quadrant of right breast in female, estrogen receptor negative ([place]) Yes    SUMMARY OF ONCOLOGIC HISTORY: Oncology History  Malignant neoplasm of upper-outer quadrant of right breast in female, estrogen receptor negative ([place])  06/20/2017 Initial Diagnosis    [place] woman status post right breast upper outer quadrant biopsy 06/20/2017 for a clinical T1b N0, stage IA invasive ductal carcinoma, grade 3, estrogen and progesterone receptor negative, but HER-2 amplified, with an MIB-1 of 30%                  07/05/2017 Genetic Testing   The patient had genetic testing due to a personal history of breast cancer and family history of breast and ovarian cancer.  The Common Hereditary Cancer Panel was ordered. The Common Hereditary Cancer Panel offered by [facility] includes sequencing and/or deletion duplication testing of the following 47 genes: APC, ATM, AXIN2, BARD1, BMPR1A, BRCA1, BRCA2, BRIP1, CDH1, CDKN2A (p14ARF), CDKN2A (p16INK4a), CKD4, CHEK2, CTNNA1, DICER1, EPCAM (Deletion/duplication testing only), GREM1 (promoter region deletion/duplication testing only), KIT, MEN1, MLH1, MSH2, MSH3, MSH6, MUTYH, NBN, NF1, NHTL1, PALB2, PDGFRA, PMS2, POLD1, POLE, PTEN, RAD50, RAD51C, RAD51D, SDHB, SDHC,  SDHD, SMAD4, SMARCA4. STK11, TP53, TSC1, TSC2, and VHL.  The following genes were evaluated for sequence changes only: SDHA and HOXB13 c.251G>A variant only.  Results: No pathogenic variants identified.  A Variant of uncertain significance in the gene MSH2 was identified c.1331G>T (p.Arg444Leu).  The date of this test report is 07/05/2017.  UPDATE:MSH2 c.1331G>T (p.Arg444Leu) has been reclassified to Benign.  The amended report date is December 27, 2020.   07/17/2017 Surgery   status post right lumpectomy and sentinel lymph node sampling for a pT1c pN0, stage IA invasive ductal carcinoma, grade 2, with negative margins.  A total of 5 lymph nodes were removed    08/07/2017 - 07/23/2018 Adjuvant Chemotherapy   adjuvant chemotherapy consisting of paclitaxel weekly x12 together with trastuzumab every 21 days   10/17/2017 - 11/14/2017 Radiation Therapy   adjuvant radiation: 40.05 Gy directed to the Right Breast in 15 fractions, followed by a boost of 10 Gy given in 5 fractions    07/27/2020 - 12/30/2020 Chemotherapy   adjuvant chemotherapy and anti-HER2 immunotherapy started 07/27/2020, consisting of CMF chemotherapy to be repeated every 21 days x 8, completed 12/28/2020     07/27/2020 - 08/02/2021 Chemotherapy   Patient is on Treatment Plan : BREAST Trastuzumab q21d     02/2021 -  Anti-estrogen oral therapy   Anastrozole daily X 5 years   07/06/2022 Relapse/Recurrence   Annual screening mammogram detected new 0.3 cm group of calcifications in the right breast and a 1 cm group of calcifications in the left breast spanning 3.8 cm total, biopsy left breast: High-grade DCIS solid type with necrosis with focally suspicious for microinvasion ER 80%, PR 0%, right breast biopsy: CSL   08/03/2022 Surgery   Right lumpectomy: Focal ductal  and stromal reactive atypia, remnants of CSL; left lumpectomy: Fibrocystic changes with UDH (no residual DCIS), margins negative, overall size based on biopsy 0.2 cm, ER 80%, PR 0%    Malignant neoplasm of left breast, estrogen receptor positive ([place])  05/19/2017 Initial Diagnosis   Malignant neoplasm of left breast, estrogen receptor negative ([place])   06/15/2020 Relapse/Recurrence   left breast upper inner quadrant biopsy 06/15/2020 for a clinical T1 N0 invasive ductal carcinoma, grade 3, weakly estrogen receptor positive, progesterone receptor negative, HER2 amplified, with an MIB-1 of 45%   07/13/2020 Cancer Staging   Staging form: Breast, AJCC 8th Edition - Pathologic stage from 07/13/2020: Stage IA (pT1c, pN0, cM0, G3, ER+, PR-, HER2+) - Signed by Gardenia Phlegm, NP on 07/28/2020 Stage prefix: Initial diagnosis Histologic grading system: 3 grade system   07/13/2020 Surgery   left lumpectomy and sentinel lymph node sampling 07/13/2020 for a pT1c pN0, stage IB invasive ductal carcinoma, grade 3, with negative margins, 5 axilla lymph nodes were removed, all benign, epeat prognostic panel again weakly estrogen receptor positive, progesterone receptor negative, now HER-2 not amplified   07/27/2020 - 12/28/2020 Adjuvant Chemotherapy   adjuvant chemotherapy and anti-HER2 immunotherapy started 07/27/2020, consisting of CMF chemotherapy to be repeated every 21 days x 8, completed 12/28/2020   09/09/2020 - 10/20/2020 Radiation Therapy   Site Technique Total Dose (Gy) Dose per Fx (Gy) Completed Fx Beam Energies  Breast, Left: Breast_Lt 3D 50/50 2 25/25 6X, 10X  Breast, Left: Breast_Lt_Bst 3D 10/10 2 5/5 6X, 10X       CHIEF COMPLIANT: follow-up after surgery  INTERVAL HISTORY: April [name] is a 72 y.o. with above-mentioned history of left breast cancer and prior history of right breast cancer. She presents to the clinic for a follow-up after surgery. She reports that surgery went well, she didn't really need the pain medication. She states that she has some moderate hot flashes, but says it is manageable. She denies any other symptoms or side effects.   ALLERGIES:   is allergic to tape, betamethasone dipropionate, caffeine, cetirizine hcl, colesevelam, fosamax [alendronate], ibuprofen, lisinopril, loratadine, nsaids, statins, neosporin [bacitracin-polymyxin b], and sulfa antibiotics.  MEDICATIONS:  Current Outpatient Medications  Medication Sig Dispense Refill   aspirin 81 MG chewable tablet Chew 81 mg by mouth daily.     Calcium Carbonate-Vitamin D (CALCIUM-VITAMIN D3 PO) Take 1,200 mg by mouth daily.     co-enzyme Q-10 30 MG capsule Take 30 mg by mouth daily.     letrozole (FEMARA) 2.5 MG tablet Take 1 tablet (2.5 mg total) by mouth daily. 90 tablet 3   Omega-3 Fatty Acids (FISH OIL) 1000 MG CAPS Take 1,000 mg by mouth daily.     Pyridoxine HCl (VITAMIN B6) 250 MG TABS Take 250 mg by mouth daily.     Red Yeast Rice Extract (RED YEAST RICE PO) Take 1 capsule by mouth daily.     vitamin B-12 (CYANOCOBALAMIN) 1000 MCG tablet Take 1,000 mcg by mouth daily.     vitamin C (ASCORBIC ACID) 250 MG tablet Take 250 mg by mouth daily.     zinc gluconate 50 MG tablet Take 50 mg by mouth daily.     No current facility-administered medications for this visit.    PHYSICAL EXAMINATION: ECOG PERFORMANCE STATUS: 1 - Symptomatic but completely ambulatory  Vitals:   08/14/22 1503  BP: (!) 167/81  Pulse: 82  Resp: 17  Temp: 97.6 F (36.4 C)  SpO2: 99%   Filed Weights  08/14/22 1503  Weight: 151 lb 9.6 oz (68.8 kg)     LABORATORY DATA:  I have reviewed the data as listed    Latest Ref Rng & Units 07/26/2022    2:04 PM 08/29/2021    3:30 PM 08/02/2021    2:07 PM  CMP  Glucose 70 - 99 mg/dL 102  104  95   BUN 8 - 23 mg/dL '14  11  14   '$ Creatinine 0.44 - 1.00 mg/dL 0.89  1.12  1.39   Sodium 135 - 145 mmol/L 139  142  138   Potassium 3.5 - 5.1 mmol/L 4.0  4.4  3.9   Chloride 98 - 111 mmol/L 102  104  103   CO2 22 - 32 mmol/L '29  30  28   '$ Calcium 8.9 - 10.3 mg/dL 10.1  9.9  9.5   Total Protein 6.5 - 8.1 g/dL   7.4   Total Bilirubin 0.3 - 1.2 mg/dL   0.3    Alkaline Phos 38 - 126 U/L   54   AST 15 - 41 U/L   28   ALT 0 - 44 U/L   26     Lab Results  Component Value Date   WBC 4.7 07/26/2022   HGB 11.7 (L) 07/26/2022   HCT 37.3 07/26/2022   MCV 92.1 07/26/2022   PLT 246 07/26/2022   NEUTROABS 2.0 08/02/2021    ASSESSMENT & PLAN:  Malignant neoplasm of upper-outer quadrant of right breast in female, estrogen receptor negative ([place]) 06/20/2017: Right breast cancer T1BN0 stage Ia grade 3, ER/PR negative, HER2 +0/5 lymph nodes negative (genetics negative), adjuvant chemo with Taxol Herceptin with Herceptin maintenance for 1 year, adjuvant radiation 06/15/2020: Left breast cancer T1N0 grade 3 IDC, weak ER positive, PR negative, biopsy had HER2 amplified with a Ki-67 of 45%, status post left lumpectomy: T1CN0 stage Ib grade 3, 0/5 lymph nodes (HER2 negative on final path) Adjuvant chemo with CMF Herceptin followed by Herceptin maintenance completed February 2023, XRT   Treatment plan: Adjuvant antiestrogen therapy with  anastrozole 1 mg daily  started 02/2021 Anastrozole toxicities: It appears that patient has a recurrence of DCIS in spite of taking anastrozole therapy.   Breast Cancer Surveillance:  Mammograms 06/29/22: new 0.3 cm calcs Right breast central to nipple middle depth suspicious for malignancy (biopsy recommended), 1 cm pleomorphic calcs left breast suspicious, total span 3.8 cm 2. Left breast Biopsy: HG DCIS, focally suspicious for microinvasion ER 80%, PR 0%; Right Breast biopsy: CSL, focal necrosis 3.  08/03/2022 right lumpectomy: Focal ductal and stromal reactive atypia, remnants of CSL; left lumpectomy: Fibrocystic changes with UDH (no residual DCIS), margins negative, overall size based on biopsy 0.2 cm, ER 80%, PR 0%   Pathology counseling: I discussed the final pathology report of the patient provided  a copy of this report. I discussed the margins.  We also discussed the final staging along with previously performed ER/PR  testing.  Treatment plan: Recommend switching from anastrozole to letrozole. Telephone visit in 3 months to assess tolerance to letrozole therapy.   No orders of the defined types were placed in this encounter.  The patient has a good understanding of the overall plan. she agrees with it. she will call with any problems that may develop before the next visit here. Total time spent: 30 mins including face to face time and time spent for planning, charting and co-ordination of care   Harriette Ohara, MD 08/14/22  I Gardiner Coins am acting as a Education administrator for [employer]  I have reviewed the above documentation for accuracy and completeness, and I agree with the above.

## 2022-08-14 ENCOUNTER — Inpatient Hospital Stay: Payer: Medicare PPO | Attending: Hematology and Oncology | Admitting: Hematology and Oncology

## 2022-08-14 ENCOUNTER — Other Ambulatory Visit: Payer: Self-pay

## 2022-08-14 VITALS — BP 167/81 | HR 82 | Temp 97.6°F | Resp 17 | Wt 151.6 lb

## 2022-08-14 DIAGNOSIS — R232 Flushing: Secondary | ICD-10-CM | POA: Insufficient documentation

## 2022-08-14 DIAGNOSIS — Z803 Family history of malignant neoplasm of breast: Secondary | ICD-10-CM | POA: Insufficient documentation

## 2022-08-14 DIAGNOSIS — Z886 Allergy status to analgesic agent status: Secondary | ICD-10-CM | POA: Diagnosis not present

## 2022-08-14 DIAGNOSIS — C50212 Malignant neoplasm of upper-inner quadrant of left female breast: Secondary | ICD-10-CM | POA: Diagnosis present

## 2022-08-14 DIAGNOSIS — Z171 Estrogen receptor negative status [ER-]: Secondary | ICD-10-CM | POA: Diagnosis not present

## 2022-08-14 DIAGNOSIS — C50411 Malignant neoplasm of upper-outer quadrant of right female breast: Secondary | ICD-10-CM

## 2022-08-14 DIAGNOSIS — Z79899 Other long term (current) drug therapy: Secondary | ICD-10-CM | POA: Diagnosis not present

## 2022-08-14 DIAGNOSIS — Z888 Allergy status to other drugs, medicaments and biological substances status: Secondary | ICD-10-CM | POA: Insufficient documentation

## 2022-08-14 DIAGNOSIS — Z170 Estrogen receptor positive status [ER+]: Secondary | ICD-10-CM | POA: Insufficient documentation

## 2022-08-14 DIAGNOSIS — Z79811 Long term (current) use of aromatase inhibitors: Secondary | ICD-10-CM | POA: Diagnosis not present

## 2022-08-14 DIAGNOSIS — Z882 Allergy status to sulfonamides status: Secondary | ICD-10-CM | POA: Insufficient documentation

## 2022-08-14 MED ORDER — LETROZOLE 2.5 MG PO TABS: 2.5000 mg | ORAL_TABLET | Freq: Every day | ORAL | 3 refills | Status: DC

## 2022-08-14 NOTE — Assessment & Plan Note (Signed)
06/20/2017: Right breast cancer T1BN0 stage Ia grade 3, ER/PR negative, HER2 +0/5 lymph nodes negative (genetics negative), adjuvant chemo with Taxol Herceptin with Herceptin maintenance for 1 year, adjuvant radiation 06/15/2020: Left breast cancer T1N0 grade 3 IDC, weak ER positive, PR negative, biopsy had HER2 amplified with a Ki-67 of 45%, status post left lumpectomy: T1CN0 stage Ib grade 3, 0/5 lymph nodes (HER2 negative on final path) Adjuvant chemo with CMF Herceptin followed by Herceptin maintenance completed February 2023, XRT   Treatment plan: Adjuvant antiestrogen therapy with  anastrozole 1 mg daily  started 02/2021 Anastrozole toxicities: It appears that patient has a recurrence of DCIS in spite of taking anastrozole therapy.   Breast Cancer Surveillance:  Mammograms 06/29/22: new 0.3 cm calcs Right breast central to nipple middle depth suspicious for malignancy (biopsy recommended), 1 cm pleomorphic calcs left breast suspicious, total span 3.8 cm 2. Left breast Biopsy: HG DCIS, focally suspicious for microinvasion ER 80%, PR 0%; Right Breast biopsy: CSL, focal necrosis 3.  08/03/2022 right lumpectomy: Focal ductal and stromal reactive atypia, remnants of CSL; left lumpectomy: Fibrocystic changes with UDH (no residual DCIS), margins negative, overall size based on biopsy 0.2 cm, ER 80%, PR 0%   Pathology counseling: I discussed the final pathology report of the patient provided  a copy of this report. I discussed the margins.  We also discussed the final staging along with previously performed ER/PR testing.  Treatment plan: Recommend switching from anastrozole to letrozole.

## 2022-09-11 ENCOUNTER — Encounter: Payer: Self-pay | Admitting: *Deleted

## 2022-09-11 DIAGNOSIS — Z171 Estrogen receptor negative status [ER-]: Secondary | ICD-10-CM

## 2022-10-30 DIAGNOSIS — H04129 Dry eye syndrome of unspecified lacrimal gland: Secondary | ICD-10-CM | POA: Diagnosis not present

## 2022-10-30 DIAGNOSIS — N1831 Chronic kidney disease, stage 3a: Secondary | ICD-10-CM | POA: Diagnosis not present

## 2022-10-30 DIAGNOSIS — H409 Unspecified glaucoma: Secondary | ICD-10-CM | POA: Diagnosis not present

## 2022-10-30 DIAGNOSIS — Z8249 Family history of ischemic heart disease and other diseases of the circulatory system: Secondary | ICD-10-CM | POA: Diagnosis not present

## 2022-10-30 DIAGNOSIS — C50919 Malignant neoplasm of unspecified site of unspecified female breast: Secondary | ICD-10-CM | POA: Diagnosis not present

## 2022-10-30 DIAGNOSIS — M1990 Unspecified osteoarthritis, unspecified site: Secondary | ICD-10-CM | POA: Diagnosis not present

## 2022-10-30 DIAGNOSIS — R030 Elevated blood-pressure reading, without diagnosis of hypertension: Secondary | ICD-10-CM | POA: Diagnosis not present

## 2022-10-30 DIAGNOSIS — K5900 Constipation, unspecified: Secondary | ICD-10-CM | POA: Diagnosis not present

## 2022-10-30 DIAGNOSIS — J309 Allergic rhinitis, unspecified: Secondary | ICD-10-CM | POA: Diagnosis not present

## 2022-11-06 ENCOUNTER — Other Ambulatory Visit: Payer: Self-pay

## 2022-11-06 ENCOUNTER — Inpatient Hospital Stay: Payer: Medicare PPO | Attending: Hematology and Oncology | Admitting: Adult Health

## 2022-11-06 ENCOUNTER — Encounter: Payer: Self-pay | Admitting: Adult Health

## 2022-11-06 VITALS — BP 181/67 | HR 80 | Temp 98.4°F | Resp 18 | Ht 63.5 in | Wt 153.0 lb

## 2022-11-06 DIAGNOSIS — Z170 Estrogen receptor positive status [ER+]: Secondary | ICD-10-CM | POA: Diagnosis not present

## 2022-11-06 DIAGNOSIS — Z803 Family history of malignant neoplasm of breast: Secondary | ICD-10-CM | POA: Insufficient documentation

## 2022-11-06 DIAGNOSIS — Z171 Estrogen receptor negative status [ER-]: Secondary | ICD-10-CM

## 2022-11-06 DIAGNOSIS — Z882 Allergy status to sulfonamides status: Secondary | ICD-10-CM | POA: Insufficient documentation

## 2022-11-06 DIAGNOSIS — Z886 Allergy status to analgesic agent status: Secondary | ICD-10-CM | POA: Insufficient documentation

## 2022-11-06 DIAGNOSIS — R232 Flushing: Secondary | ICD-10-CM | POA: Diagnosis not present

## 2022-11-06 DIAGNOSIS — Z79811 Long term (current) use of aromatase inhibitors: Secondary | ICD-10-CM | POA: Diagnosis not present

## 2022-11-06 DIAGNOSIS — C50012 Malignant neoplasm of nipple and areola, left female breast: Secondary | ICD-10-CM | POA: Diagnosis not present

## 2022-11-06 DIAGNOSIS — Z923 Personal history of irradiation: Secondary | ICD-10-CM | POA: Diagnosis not present

## 2022-11-06 DIAGNOSIS — Z888 Allergy status to other drugs, medicaments and biological substances status: Secondary | ICD-10-CM | POA: Diagnosis not present

## 2022-11-06 DIAGNOSIS — Z8041 Family history of malignant neoplasm of ovary: Secondary | ICD-10-CM | POA: Insufficient documentation

## 2022-11-06 DIAGNOSIS — Z9189 Other specified personal risk factors, not elsewhere classified: Secondary | ICD-10-CM

## 2022-11-06 DIAGNOSIS — C50212 Malignant neoplasm of upper-inner quadrant of left female breast: Secondary | ICD-10-CM | POA: Insufficient documentation

## 2022-11-06 DIAGNOSIS — Z90710 Acquired absence of both cervix and uterus: Secondary | ICD-10-CM | POA: Insufficient documentation

## 2022-11-06 DIAGNOSIS — C50411 Malignant neoplasm of upper-outer quadrant of right female breast: Secondary | ICD-10-CM | POA: Diagnosis not present

## 2022-11-06 DIAGNOSIS — Z79899 Other long term (current) drug therapy: Secondary | ICD-10-CM | POA: Diagnosis not present

## 2022-11-06 NOTE — Progress Notes (Signed)
SURVIVORSHIP VISIT:  BRIEF ONCOLOGIC HISTORY:  Oncology History  Malignant neoplasm of upper-outer quadrant of right breast in female, estrogen receptor negative (HCC)  06/20/2017 Initial Diagnosis    [place] woman status post right breast upper outer quadrant biopsy 06/20/2017 for a clinical T1b N0, stage IA invasive ductal carcinoma, grade 3, estrogen and progesterone receptor negative, but HER-2 amplified, with an MIB-1 of 30%                  07/05/2017 Genetic Testing   The patient had genetic testing due to a personal history of breast cancer and family history of breast and ovarian cancer.  The Common Hereditary Cancer Panel was ordered. The Common Hereditary Cancer Panel offered by [facility] includes sequencing and/or deletion duplication testing of the following 47 genes: APC, ATM, AXIN2, BARD1, BMPR1A, BRCA1, BRCA2, BRIP1, CDH1, CDKN2A (p14ARF), CDKN2A (p16INK4a), CKD4, CHEK2, CTNNA1, DICER1, EPCAM (Deletion/duplication testing only), GREM1 (promoter region deletion/duplication testing only), KIT, MEN1, MLH1, MSH2, MSH3, MSH6, MUTYH, NBN, NF1, NHTL1, PALB2, PDGFRA, PMS2, POLD1, POLE, PTEN, RAD50, RAD51C, RAD51D, SDHB, SDHC, SDHD, SMAD4, SMARCA4. STK11, TP53, TSC1, TSC2, and VHL.  The following genes were evaluated for sequence changes only: SDHA and HOXB13 c.251G>A variant only.  Results: No pathogenic variants identified.  A Variant of uncertain significance in the gene MSH2 was identified c.1331G>T (p.Arg444Leu).  The date of this test report is 07/05/2017.  UPDATE:MSH2 c.1331G>T (p.Arg444Leu) has been reclassified to Benign.  The amended report date is December 27, 2020.   07/17/2017 Surgery   status post right lumpectomy and sentinel lymph node sampling for a pT1c pN0, stage IA invasive ductal carcinoma, grade 2, with negative margins.  A total of 5 lymph nodes were removed    08/07/2017 - 07/23/2018 Adjuvant Chemotherapy   adjuvant chemotherapy consisting of paclitaxel weekly x12  together with trastuzumab every 21 days   10/17/2017 - 11/14/2017 Radiation Therapy   adjuvant radiation: 40.05 Gy directed to the Right Breast in 15 fractions, followed by a boost of 10 Gy given in 5 fractions    07/27/2020 - 12/30/2020 Chemotherapy   adjuvant chemotherapy and anti-HER2 immunotherapy started 07/27/2020, consisting of CMF chemotherapy to be repeated every 21 days x 8, completed 12/28/2020     07/27/2020 - 08/02/2021 Chemotherapy   Patient is on Treatment Plan : BREAST Trastuzumab q21d     02/2021 -  Anti-estrogen oral therapy   Anastrozole daily X 5 years   07/06/2022 Relapse/Recurrence   Annual screening mammogram detected new 0.3 cm group of calcifications in the right breast and a 1 cm group of calcifications in the left breast spanning 3.8 cm total, biopsy left breast: High-grade DCIS solid type with necrosis with focally suspicious for microinvasion ER 80%, PR 0%, right breast biopsy: CSL   08/03/2022 Surgery   Right lumpectomy: Focal ductal and stromal reactive atypia, remnants of CSL; left lumpectomy: Fibrocystic changes with UDH (no residual DCIS), margins negative, overall size based on biopsy 0.2 cm, ER 80%, PR 0%   Malignant neoplasm of left breast, estrogen receptor positive (HCC)  05/19/2017 Initial Diagnosis   Malignant neoplasm of left breast, estrogen receptor negative (HCC)   06/15/2020 Relapse/Recurrence   left breast upper inner quadrant biopsy 06/15/2020 for a clinical T1 N0 invasive ductal carcinoma, grade 3, weakly estrogen receptor positive, progesterone receptor negative, HER2 amplified, with an MIB-1 of 45%   07/13/2020 Cancer Staging   Staging form: Breast, AJCC 8th Edition - Pathologic stage from 07/13/2020: Stage IA (pT1c, pN0, cM0,  G3, ER+, PR-, HER2+) - Signed by Loa Socks, NP on 07/28/2020 Stage prefix: Initial diagnosis Histologic grading system: 3 grade system   07/13/2020 Surgery   left lumpectomy and sentinel lymph node sampling  07/13/2020 for a pT1c pN0, stage IB invasive ductal carcinoma, grade 3, with negative margins, 5 axilla lymph nodes were removed, all benign, epeat prognostic panel again weakly estrogen receptor positive, progesterone receptor negative, now HER-2 not amplified   07/27/2020 - 12/28/2020 Adjuvant Chemotherapy   adjuvant chemotherapy and anti-HER2 immunotherapy started 07/27/2020, consisting of CMF chemotherapy to be repeated every 21 days x 8, completed 12/28/2020   09/09/2020 - 10/20/2020 Radiation Therapy   Site Technique Total Dose (Gy) Dose per Fx (Gy) Completed Fx Beam Energies  Breast, Left: Breast_Lt 3D 50/50 2 25/25 6X, 10X  Breast, Left: Breast_Lt_Bst 3D 10/10 2 5/5 6X, 10X     07/06/2022 Relapse/Recurrence   Left breast biopsy: high grade DCIS, 1.12mm in greatest extent, suspicious for microinvasion Right Breast biopsy: CSL, 2mm, negative for carcinoma   08/03/2022 Surgery   Right lumpectomy: focal ductal and stromal reactive atypia adjacent to biopsy site, fibrocystic changes with usual ductal hyperplasia, focal changes consistent with remnants of CSL. Left lumpectomy: fibrocystic changes, no residual DCIS, margins negative, 0 LN biopsied, ER 80%, PR negative.     08/03/2022 Cancer Staging   Staging form: Breast, AJCC 8th Edition - Pathologic stage from 08/03/2022: Stage 0 (rpTis (DCIS), pN0, cM0, ER+, PR-) - Signed by Loa Socks, NP on 11/06/2022 Stage prefix: Recurrence Nuclear grade: G3   08/2022 -  Anti-estrogen oral therapy   Change from Anastrozole to Letrozole     INTERVAL HISTORY:  April [name] to review her survivorship care plan detailing her treatment course for breast cancer, as well as monitoring long-term side effects of that treatment, education regarding health maintenance, screening, and overall wellness and health promotion.     Overall, April [name] reports feeling quite well.  She is taking Letrozole daily with good tolerance.  She denies any  significant issues other than hot flashes.    REVIEW OF SYSTEMS:  Review of Systems  Constitutional:  Negative for appetite change, chills, fatigue, fever and unexpected weight change.  HENT:   Negative for hearing loss, lump/mass and trouble swallowing.   Eyes:  Negative for eye problems and icterus.  Respiratory:  Negative for chest tightness, cough and shortness of breath.   Cardiovascular:  Negative for chest pain, leg swelling and palpitations.  Gastrointestinal:  Negative for abdominal distention, abdominal pain, constipation, diarrhea, nausea and vomiting.  Endocrine: Negative for hot flashes.  Genitourinary:  Negative for difficulty urinating.   Musculoskeletal:  Negative for arthralgias.  Skin:  Negative for itching and rash.  Neurological:  Negative for dizziness, extremity weakness, headaches and numbness.  Hematological:  Negative for adenopathy. Does not bruise/bleed easily.  Psychiatric/Behavioral:  Negative for depression. The patient is not nervous/anxious.    Breast: Denies any new nodularity, masses, tenderness, nipple changes, or nipple discharge.       PAST MEDICAL/SURGICAL HISTORY:  Past Medical History:  Diagnosis Date   Breast cancer (HCC) 05/2017   right breast   Breast cancer (HCC) 06/2020   left breast IDC   Eczema    Family history of breast cancer    Family history of ovarian cancer    History of radiation therapy 10/17/17- 11/14/17   40.05 directed to the right breast in 15 fractions, followed by a boost of 10 gy given in 5  fractions.    Hyperlipidemia    Hypertension    toxemia during pregnancy, no meds now   Past Surgical History:  Procedure Laterality Date   ABDOMINAL HYSTERECTOMY     BREAST LUMPECTOMY WITH RADIOACTIVE SEED AND SENTINEL LYMPH NODE BIOPSY Right 07/17/2017   Procedure: BREAST LUMPECTOMY WITH RADIOACTIVE SEED AND SENTINEL LYMPH NODE BIOPSY;  Surgeon: Emelia Loron, MD;  Location: [facility];  Service: General;   Laterality: Right;   BREAST LUMPECTOMY WITH RADIOACTIVE SEED AND SENTINEL LYMPH NODE BIOPSY Left 07/13/2020   Procedure: LEFT BREAST LUMPECTOMY WITH RADIOACTIVE SEED AND LEFT AXILLARY SENTINEL LYMPH NODE BIOPSY;  Surgeon: Emelia Loron, MD;  Location: [facility];  Service: General;  Laterality: Left;   BREAST LUMPECTOMY WITH RADIOACTIVE SEED LOCALIZATION Left 08/03/2022   Procedure: LEFT BREAST BRACKETED LUMPECTOMY WITH RADIOACTIVE SEED LOCALIZATION;  Surgeon: Emelia Loron, MD;  Location: [facility] OR;  Service: General;  Laterality: Left;   CESAREAN SECTION     x4   DILATION AND CURETTAGE OF UTERUS     EYE SURGERY Bilateral 2021   cataract removal   KNEE SURGERY Right    PORT-A-CATH REMOVAL Right 09/04/2018   Procedure: REMOVAL PORT-A-CATH;  Surgeon: Emelia Loron, MD;  Location: [facility];  Service: General;  Laterality: Right;   PORT-A-CATH REMOVAL Right 09/05/2021   Procedure: REMOVAL PORT-A-CATH;  Surgeon: Emelia Loron, MD;  Location: [facility];  Service: General;  Laterality: Right;   PORTACATH PLACEMENT N/A 07/17/2017   Procedure: INSERTION PORT-A-CATH WITH [place];  Surgeon: Emelia Loron, MD;  Location: [facility];  Service: General;  Laterality: N/A;   PORTACATH PLACEMENT Right 07/13/2020   Procedure: INSERTION PORT-A-CATH WITH ULTRASOUND GUIDANCE;  Surgeon: Emelia Loron, MD;  Location: [facility];  Service: General;  Laterality: Right;   RADIOACTIVE SEED GUIDED EXCISIONAL BREAST BIOPSY Right 08/03/2022   Procedure: RADIOACTIVE SEED GUIDED EXCISIONAL RIGHT BREAST BIOPSY;  Surgeon: Emelia Loron, MD;  Location: [facility] OR;  Service: General;  Laterality: Right;     ALLERGIES:  Allergies  Allergen Reactions   Tape Itching and Rash   Betamethasone Dipropionate Other (See Comments)   Caffeine Other (See Comments)    Palpitations    Cetirizine Hcl Other (See Comments)    Pt states she  can take this now   Colesevelam     Other Reaction(s): bloating and constipation   Fosamax [Alendronate] Other (See Comments)    GERD   Ibuprofen     Feels like a knot in her stomach and throat   Lisinopril Other (See Comments)   Loratadine Other (See Comments)    Pt states she can take this now   Nsaids     Stomach issue   Statins Other (See Comments)   Neosporin [Bacitracin-Polymyxin B] Itching and Rash   Sulfa Antibiotics Rash     CURRENT MEDICATIONS:  Outpatient Encounter Medications as of 11/06/2022  Medication Sig   aspirin 81 MG chewable tablet Chew 81 mg by mouth daily.   Calcium Carbonate-Vitamin D (CALCIUM-VITAMIN D3 PO) Take 1,200 mg by mouth daily.   co-enzyme Q-10 30 MG capsule Take 30 mg by mouth daily.   letrozole (FEMARA) 2.5 MG tablet Take 1 tablet (2.5 mg total) by mouth daily.   Omega-3 Fatty Acids (FISH OIL) 1000 MG CAPS Take 1,000 mg by mouth daily.   Pyridoxine HCl (VITAMIN B6) 250 MG TABS Take 250 mg by mouth daily.   Red Yeast Rice Extract (RED YEAST RICE  PO) Take 1 capsule by mouth daily.   Specialty Vitamins Products (VITAMINS FOR HAIR PO) Take 2 each by mouth once.   vitamin B-12 (CYANOCOBALAMIN) 1000 MCG tablet Take 1,000 mcg by mouth daily.   vitamin C (ASCORBIC ACID) 250 MG tablet Take 250 mg by mouth daily.   zinc gluconate 50 MG tablet Take 50 mg by mouth daily.   No facility-administered encounter medications on file as of 11/06/2022.     ONCOLOGIC FAMILY HISTORY:  Family History  Problem Relation Age of Onset   Breast cancer Mother 26       again at 18 in other breast    Heart attack Father 80   Breast cancer Sister 63   Breast cancer Maternal Grandmother 19       spread to lungs, died at 81   Ovarian cancer Cousin 49   Prostate cancer Cousin      SOCIAL HISTORY:  Social History   Socioeconomic History   Marital status: Married    Spouse name: Ezequiel Kayser   Number of children: 2   Years of education: Not on file   Highest education  level: Not on file  Occupational History   Not on file  Tobacco Use   Smoking status: Never   Smokeless tobacco: Never  Vaping Use   Vaping Use: Never used  Substance and Sexual Activity   Alcohol use: Yes    Comment: social   Drug use: No   Sexual activity: Not Currently    Birth control/protection: Surgical  Other Topics Concern   Not on file  Social History Narrative   Not on file   Social Determinants of Health   Financial Resource Strain: Not on file  Food Insecurity: Not on file  Transportation Needs: Not on file  Physical Activity: Not on file  Stress: Not on file  Social Connections: Not on file  Intimate Partner Violence: Not on file     OBSERVATIONS/OBJECTIVE:  BP (!) 181/67 (BP Location: Right Arm, Patient Position: Sitting)   Pulse 80   Temp 98.4 F (36.9 C) (Tympanic)   Resp 18   Ht 5' 3.5" (1.613 m)   Wt 153 lb (69.4 kg)   SpO2 100%   BMI 26.68 kg/m  GENERAL: Patient is a well appearing female in no acute distress HEENT:  Sclerae anicteric.  Oropharynx clear and moist. No ulcerations or evidence of oropharyngeal candidiasis. Neck is supple.  NODES:  No cervical, supraclavicular, or axillary lymphadenopathy palpated.  BREAST EXAM: Right breast status postlumpectomy and radiation no sign of local recurrence left breast status postlumpectomy and radiation no sign of local recurrence. LUNGS:  Clear to auscultation bilaterally.  No wheezes or rhonchi. HEART:  Regular rate and rhythm. No murmur appreciated. ABDOMEN:  Soft, nontender.  Positive, normoactive bowel sounds. No organomegaly palpated. MSK:  No focal spinal tenderness to palpation. Full range of motion bilaterally in the upper extremities. EXTREMITIES:  No peripheral edema.   SKIN:  Clear with no obvious rashes or skin changes. No nail dyscrasia. NEURO:  Nonfocal. Well oriented.  Appropriate affect.   LABORATORY DATA:  None for this visit.  DIAGNOSTIC IMAGING:  None for this visit.       ASSESSMENT AND PLAN:  Ms.. [name] is a pleasant 72 y.o. female with recurrent left breast DCIS, ER+/PR-, diagnosed in 06/2022, treated with lumpectomy, and anti-estrogen therapy with Letrozole beginning in 07/2022.  She presents to the Survivorship Clinic for our initial meeting and routine follow-up post-completion of treatment  for breast cancer.    1. Recurrent breast cancer:  April [name] is continuing to recover from definitive treatment for breast cancer. She will follow-up with her medical oncologist, Dr.  Pamelia Hoit in 6 months with history and physical exam per surveillance protocol.  She will continue her anti-estrogen therapy with Letrozole. Thus far, she is tolerating the Letrozole well, with minimal side effects. Her mammogram is due 06/2022; orders placed today.   Discussed that since this is her third breast cancer recurrence that supplemental MRI is certainly an option for intensified screening for her.  We discussed risks and benefits and she would like to proceed with this option.    Today, a comprehensive survivorship care plan and treatment summary was reviewed with the patient today detailing her breast cancer diagnosis, treatment course, potential late/long-term effects of treatment, appropriate follow-up care with recommendations for the future, and patient education resources.  A copy of this summary, along with a letter will be sent to the patient's primary care provider via mail/fax/In Basket message after today's visit.    2. Bone health:  Given April [name]'s age/history of breast cancer and her current treatment regimen including anti-estrogen therapy with letrozole, she is at risk for bone demineralization.  Her most recent bone density testing occurred in July 2023 demonstrating osteoporosis with a T-score of -2.5 in the AP spine.  She is taking calcium, vitamin D, and is participating in osteo strong.  Her next bone density testing is due in July 2025.  She has received  information about bisphosphonate therapy before.  She was given education on specific activities to promote bone health.  3. Cancer screening:  Due to April [name]'s history and her age, she should receive screening for skin cancers, colon cancer.  The information and recommendations are listed on the patient's comprehensive care plan/treatment summary and were reviewed in detail with the patient.    4. Health maintenance and wellness promotion: April [name] was encouraged to consume 5-7 servings of fruits and vegetables per day. We reviewed the "Nutrition Rainbow" handout.  She was also encouraged to engage in moderate to vigorous exercise for 30 minutes per day most days of the week.  She was instructed to limit her alcohol consumption and continue to abstain from tobacco use.     5. Support services/counseling: It is not uncommon for this period of the patient's cancer care trajectory to be one of many emotions and stressors.   She was given information regarding our available services and encouraged to contact me with any questions or for help enrolling in any of our support group/programs.    Follow up instructions:    -Return to cancer center in 6 months for follow-up with Dr. Pamelia Hoit -Mammogram due in January 2025 -Breast MRI in July 2024 -Bone density testing in July 2025 -She is welcome to return back to the Survivorship Clinic at any time; no additional follow-up needed at this time.  -Consider referral back to survivorship as a long-term survivor for continued surveillance  The patient was provided an opportunity to ask questions and all were answered. The patient agreed with the plan and demonstrated an understanding of the instructions.   Total encounter time:45 minutes*in face-to-face visit time, chart review, lab review, care coordination, order entry, and documentation of the encounter time.    April Anes, NP 11/06/22 1:13 PM Medical Oncology and Hematology [facility] [address] [place], [place] [address] Tel. [phone]    Fax. [phone]  *Total  Encounter Time as defined by the Centers for Medicare and Medicaid Services includes, in addition to the face-to-face time of a patient visit (documented in the note above) non-face-to-face time: obtaining and reviewing outside history, ordering and reviewing medications, tests or procedures, care coordination (communications with other health care professionals or caregivers) and documentation in the medical record.

## 2022-11-06 NOTE — Patient Instructions (Signed)
Fezolinetant Tablets What is this medication? FEZOLINETANT (FEZ oh LIN e tant) reduces the number and severity of hot flashes due to menopause. It works by blocking substances in your body that cause hot flashes and night sweats. This medicine may be used for other purposes; ask your health care provider or pharmacist if you have questions. COMMON BRAND NAME(S): VEOZAH What should I tell my care team before I take this medication? They need to know if you have any of these conditions: Kidney disease Liver disease An unusual or allergic reaction to fezolinetant, other medications, foods, dyes, or preservatives Pregnant or trying to get pregnant Breastfeeding How should I use this medication? Take this medication by mouth with water. Take it as directed on the prescription label at the same time every day. Do not cut, crush, or chew this medication. Swallow the tablets whole. You can take it with or without food. If it upsets your stomach, take it with food. Keep taking it unless your care team tells you to stop. Talk to your care team about the use of this medication in children. Special care may be needed. Overdosage: If you think you have taken too much of this medicine contact a poison control center or emergency room at once. NOTE: This medicine is only for you. Do not share this medicine with others. What if I miss a dose? If you miss a dose, take it as soon as you can unless it is more than 12 hours late. If it is more than 12 hours late, skip the missed dose. Take the next dose at the normal time. What may interact with this medication? Other medications may affect the way this medication works. Talk with your care team about all of the medications you take. They may suggest changes to your treatment plan to lower the risk of side effects and to make sure your medications work as intended. This list may not describe all possible interactions. Give your health care provider a list of all  the medicines, herbs, non-prescription drugs, or dietary supplements you use. Also tell them if you smoke, drink alcohol, or use illegal drugs. Some items may interact with your medicine. What should I watch for while using this medication? Visit your care team for regular checks on your progress. Tell your care team if your symptoms do not start to get better or if they get worse. You may need blood work while taking this medication. What side effects may I notice from receiving this medication? Side effects that you should report to your care team as soon as possible: Allergic reactions--skin rash, itching, hives, swelling of the face, lips, tongue, or throat Liver injury--right upper belly pain, loss of appetite, nausea, light-colored stool, dark yellow or brown urine, yellowing skin or eyes, unusual weakness or fatigue Side effects that usually do not require medical attention (report these to your care team if they continue or are bothersome): Back pain Diarrhea Hot flashes Stomach pain Trouble sleeping This list may not describe all possible side effects. Call your doctor for medical advice about side effects. You may report side effects to FDA at 1-800-FDA-1088. Where should I keep my medication? Keep out of the reach of children and pets. Store at room temperature between 20 and 25 degrees C (68 and 77 degrees F). Get rid of any unused medication after the expiration date. To get rid of medications that are no longer needed or have expired: Take the medication to a medication take-back program. Check with   your pharmacy or law enforcement to find a location. If you cannot return the medication, check the label or package insert to see if the medication should be thrown out in the garbage or flushed down the toilet. If you are not sure, ask your care team. If it is safe to put it in the trash, take the medication out of the container. Mix the medication with cat litter, dirt, coffee  grounds, or other unwanted substance. Seal the mixture in a bag or container. Put it in the trash. NOTE: This sheet is a summary. It may not cover all possible information. If you have questions about this medicine, talk to your doctor, pharmacist, or health care provider.  2023 Elsevier/Gold Standard (2021-11-10 00:00:00)  

## 2022-11-20 DIAGNOSIS — C50912 Malignant neoplasm of unspecified site of left female breast: Secondary | ICD-10-CM | POA: Diagnosis not present

## 2022-11-20 DIAGNOSIS — I10 Essential (primary) hypertension: Secondary | ICD-10-CM | POA: Diagnosis not present

## 2022-11-20 DIAGNOSIS — R7301 Impaired fasting glucose: Secondary | ICD-10-CM | POA: Diagnosis not present

## 2022-11-20 DIAGNOSIS — Z8673 Personal history of transient ischemic attack (TIA), and cerebral infarction without residual deficits: Secondary | ICD-10-CM | POA: Diagnosis not present

## 2022-11-20 DIAGNOSIS — E7800 Pure hypercholesterolemia, unspecified: Secondary | ICD-10-CM | POA: Diagnosis not present

## 2022-11-20 DIAGNOSIS — M810 Age-related osteoporosis without current pathological fracture: Secondary | ICD-10-CM | POA: Diagnosis not present

## 2022-11-20 DIAGNOSIS — Z0000 Encounter for general adult medical examination without abnormal findings: Secondary | ICD-10-CM | POA: Diagnosis not present

## 2022-11-20 DIAGNOSIS — E559 Vitamin D deficiency, unspecified: Secondary | ICD-10-CM | POA: Diagnosis not present

## 2022-11-20 DIAGNOSIS — J309 Allergic rhinitis, unspecified: Secondary | ICD-10-CM | POA: Diagnosis not present

## 2022-11-20 DIAGNOSIS — D649 Anemia, unspecified: Secondary | ICD-10-CM | POA: Diagnosis not present

## 2022-12-14 ENCOUNTER — Ambulatory Visit (HOSPITAL_COMMUNITY)
Admission: RE | Admit: 2022-12-14 | Discharge: 2022-12-14 | Disposition: A | Discharge status: Home / Self Care | Payer: Medicare PPO | Source: Ambulatory Visit | Attending: Adult Health | Admitting: Adult Health

## 2022-12-14 ENCOUNTER — Telehealth: Payer: Self-pay

## 2022-12-14 DIAGNOSIS — C50411 Malignant neoplasm of upper-outer quadrant of right female breast: Secondary | ICD-10-CM | POA: Diagnosis not present

## 2022-12-14 DIAGNOSIS — C50012 Malignant neoplasm of nipple and areola, left female breast: Secondary | ICD-10-CM | POA: Insufficient documentation

## 2022-12-14 DIAGNOSIS — D0512 Intraductal carcinoma in situ of left breast: Secondary | ICD-10-CM | POA: Diagnosis not present

## 2022-12-14 DIAGNOSIS — Z170 Estrogen receptor positive status [ER+]: Secondary | ICD-10-CM | POA: Insufficient documentation

## 2022-12-14 DIAGNOSIS — Z171 Estrogen receptor negative status [ER-]: Secondary | ICD-10-CM | POA: Diagnosis not present

## 2022-12-14 DIAGNOSIS — Z803 Family history of malignant neoplasm of breast: Secondary | ICD-10-CM | POA: Diagnosis not present

## 2022-12-14 DIAGNOSIS — Z853 Personal history of malignant neoplasm of breast: Secondary | ICD-10-CM | POA: Diagnosis not present

## 2022-12-14 DIAGNOSIS — Z9189 Other specified personal risk factors, not elsewhere classified: Secondary | ICD-10-CM | POA: Insufficient documentation

## 2022-12-14 DIAGNOSIS — R921 Mammographic calcification found on diagnostic imaging of breast: Secondary | ICD-10-CM | POA: Diagnosis not present

## 2022-12-14 MED ORDER — GADOBUTROL 1 MMOL/ML IV SOLN
7.0000 mL | Freq: Once | INTRAVENOUS | Status: AC | PRN
  Administered 2022-12-14: 7 mL via INTRAVENOUS

## 2022-12-14 NOTE — Telephone Encounter (Signed)
-----   Message from Loa Socks, NP sent at 12/14/2022  1:14 PM EDT ----- Zada Finders let patient know that her breast MRI does not show any evidence of malignancy. ----- Message ----- From: Interface, Rad Results In Sent: 12/14/2022  12:04 PM EDT To: Loa Socks, NP

## 2022-12-14 NOTE — Telephone Encounter (Signed)
Pt made aware of MRI results per NP. She knows to call with any questions or concerns.

## 2023-01-09 DIAGNOSIS — H40011 Open angle with borderline findings, low risk, right eye: Secondary | ICD-10-CM | POA: Diagnosis not present

## 2023-01-09 DIAGNOSIS — H401121 Primary open-angle glaucoma, left eye, mild stage: Secondary | ICD-10-CM | POA: Diagnosis not present

## 2023-01-18 ENCOUNTER — Ambulatory Visit: Payer: Medicare PPO | Admitting: Orthopedic Surgery

## 2023-01-18 ENCOUNTER — Other Ambulatory Visit (INDEPENDENT_AMBULATORY_CARE_PROVIDER_SITE_OTHER): Payer: Medicare PPO

## 2023-01-18 DIAGNOSIS — M25561 Pain in right knee: Secondary | ICD-10-CM

## 2023-02-01 ENCOUNTER — Encounter: Payer: Self-pay | Admitting: Orthopedic Surgery

## 2023-02-01 NOTE — Progress Notes (Signed)
Office Visit Note   Patient: April [name]           Date of Birth: [birth year]           MRN: [id number] Visit Date: 01/18/2023              Requested by: Ollen Bowl, MD [address] [place],  [place] [address] PCP: Ollen Bowl, MD  Chief Complaint  Patient presents with   Right Knee - Pain      HPI: Patient is a 72 year old woman who presents with right knee pain.  She is status post arthroscopy remotely.  She has swelling trouble with full extension.  States there is popping but no stiffness.  She walks on the treadmill 1 hour a day  Assessment & Plan: Visit Diagnoses:  1. Acute pain of right knee     Plan: Recommend Voltaren gel and strengthening.  Discussed that we could proceed with a steroid injection.  Patient states she would like to try the Voltaren gel first.  Follow-Up Instructions: No follow-ups on file.   Ortho Exam  Patient is alert, oriented, no adenopathy, well-dressed, normal affect, normal respiratory effort. Examination there is no crepitation with range of motion of the right knee collaterals and cruciates are stable there is no effusion.  Medial lateral joint lines are nontender to palpation.  She does have tightness in the Achilles and Achilles stretching was recommended.  Imaging: No results found. No images are attached to the encounter.  Labs: Lab Results  Component Value Date   HGBA1C 5.9 (H) 08/10/2017     Lab Results  Component Value Date   ALBUMIN 4.0 08/02/2021   ALBUMIN 4.4 07/12/2021   ALBUMIN 4.2 06/21/2021    No results found for: "MG" No results found for: "VD25OH"  No results found for: "PREALBUMIN"    Latest Ref Rng & Units 07/26/2022    2:04 PM 08/02/2021    2:07 PM 07/12/2021   12:47 PM  CBC EXTENDED  WBC 4.0 - 10.5 K/uL 4.7  3.5  3.4   RBC 3.87 - 5.11 MIL/uL 4.05  3.45  3.61   Hemoglobin 12.0 - 15.0 g/dL 86.5  78.4  69.6   HCT 36.0 - 46.0 % 37.3  31.0  32.4   Platelets 150 - 400 K/uL  246  214  215   NEUT# 1.7 - 7.7 K/uL  2.0  1.7   Lymph# 0.7 - 4.0 K/uL  1.0  1.1      There is no height or weight on file to calculate BMI.  Orders:  Orders Placed This Encounter  Procedures   XR Knee 1-2 Views Right   No orders of the defined types were placed in this encounter.    Procedures: No procedures performed  Clinical Data: No additional findings.  ROS:  All other systems negative, except as noted in the HPI. Review of Systems  Objective: Vital Signs: There were no vitals taken for this visit.  Specialty Comments:  No specialty comments available.  PMFS History: Patient Active Problem List   Diagnosis Date Noted   Age-related osteoporosis without current pathological fracture 09/08/2020   Allergic rhinitis 09/08/2020   Palpitations 09/08/2020   Personal history of malignant neoplasm of breast 09/08/2020   Personal history of transient ischemic attack (TIA), and cerebral infarction without residual deficits 09/08/2020   Pure hypercholesterolemia 09/08/2020   Vitamin D deficiency 09/08/2020   Neuropathy due to chemotherapeutic drug (HCC) 07/23/2018  Port-A-Cath in place 08/28/2017   TIA (transient ischemic attack) 08/09/2017   Hypertension    Genetic testing 07/06/2017   Family history of ovarian cancer    Family history of breast cancer    Malignant neoplasm of upper-outer quadrant of right breast in female, estrogen receptor negative (HCC) 06/26/2017   Malignant neoplasm of left breast, estrogen receptor positive (HCC) 05/19/2017   Past Medical History:  Diagnosis Date   Breast cancer (HCC) 05/2017   right breast   Breast cancer (HCC) 06/2020   left breast IDC   Eczema    Family history of breast cancer    Family history of ovarian cancer    History of radiation therapy 10/17/17- 11/14/17   40.05 directed to the right breast in 15 fractions, followed by a boost of 10 gy given in 5 fractions.    Hyperlipidemia    Hypertension    toxemia during  pregnancy, no meds now    Family History  Problem Relation Age of Onset   Breast cancer Mother 37       again at 66 in other breast    Heart attack Father 70   Breast cancer Sister 2   Breast cancer Maternal Grandmother 20       spread to lungs, died at 52   Ovarian cancer Cousin 78   Prostate cancer Cousin     Past Surgical History:  Procedure Laterality Date   ABDOMINAL HYSTERECTOMY     BREAST LUMPECTOMY WITH RADIOACTIVE SEED AND SENTINEL LYMPH NODE BIOPSY Right 07/17/2017   Procedure: BREAST LUMPECTOMY WITH RADIOACTIVE SEED AND SENTINEL LYMPH NODE BIOPSY;  Surgeon: Emelia Loron, MD;  Location: [facility];  Service: General;  Laterality: Right;   BREAST LUMPECTOMY WITH RADIOACTIVE SEED AND SENTINEL LYMPH NODE BIOPSY Left 07/13/2020   Procedure: LEFT BREAST LUMPECTOMY WITH RADIOACTIVE SEED AND LEFT AXILLARY SENTINEL LYMPH NODE BIOPSY;  Surgeon: Emelia Loron, MD;  Location: [facility];  Service: General;  Laterality: Left;   BREAST LUMPECTOMY WITH RADIOACTIVE SEED LOCALIZATION Left 08/03/2022   Procedure: LEFT BREAST BRACKETED LUMPECTOMY WITH RADIOACTIVE SEED LOCALIZATION;  Surgeon: Emelia Loron, MD;  Location: [facility];  Service: General;  Laterality: Left;   CESAREAN SECTION     x4   DILATION AND CURETTAGE OF UTERUS     EYE SURGERY Bilateral 2021   cataract removal   KNEE SURGERY Right    PORT-A-CATH REMOVAL Right 09/04/2018   Procedure: REMOVAL PORT-A-CATH;  Surgeon: Emelia Loron, MD;  Location: [facility];  Service: General;  Laterality: Right;   PORT-A-CATH REMOVAL Right 09/05/2021   Procedure: REMOVAL PORT-A-CATH;  Surgeon: Emelia Loron, MD;  Location: [facility];  Service: General;  Laterality: Right;   PORTACATH PLACEMENT N/A 07/17/2017   Procedure: INSERTION PORT-A-CATH WITH [place];  Surgeon: Emelia Loron, MD;  Location: [facility];  Service: General;  Laterality: N/A;    PORTACATH PLACEMENT Right 07/13/2020   Procedure: INSERTION PORT-A-CATH WITH ULTRASOUND GUIDANCE;  Surgeon: Emelia Loron, MD;  Location:  SURGERY CENTER;  Service: General;  Laterality: Right;   RADIOACTIVE SEED GUIDED EXCISIONAL BREAST BIOPSY Right 08/03/2022   Procedure: RADIOACTIVE SEED GUIDED EXCISIONAL RIGHT BREAST BIOPSY;  Surgeon: Emelia Loron, MD;  Location: [facility];  Service: General;  Laterality: Right;   Social History   Occupational History   Not on file  Tobacco Use   Smoking status: Never   Smokeless tobacco: Never  Vaping Use   Vaping status: Never Used  Substance and Sexual Activity   Alcohol use: Yes    Comment: social   Drug use: No   Sexual activity: Not Currently    Birth control/protection: Surgical

## 2023-02-13 DIAGNOSIS — H401121 Primary open-angle glaucoma, left eye, mild stage: Secondary | ICD-10-CM | POA: Diagnosis not present

## 2023-02-15 DIAGNOSIS — G8929 Other chronic pain: Secondary | ICD-10-CM | POA: Diagnosis not present

## 2023-02-15 DIAGNOSIS — M25561 Pain in right knee: Secondary | ICD-10-CM | POA: Diagnosis not present

## 2023-03-05 ENCOUNTER — Telehealth: Payer: Self-pay | Admitting: Hematology and Oncology
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
## 2023-03-06 DIAGNOSIS — N6452 Nipple discharge: Secondary | ICD-10-CM | POA: Diagnosis not present

## 2023-03-06 DIAGNOSIS — C50912 Malignant neoplasm of unspecified site of left female breast: Secondary | ICD-10-CM | POA: Diagnosis not present

## 2023-03-07 DIAGNOSIS — Z171 Estrogen receptor negative status [ER-]: Secondary | ICD-10-CM | POA: Diagnosis not present

## 2023-03-07 DIAGNOSIS — C50411 Malignant neoplasm of upper-outer quadrant of right female breast: Secondary | ICD-10-CM | POA: Diagnosis not present

## 2023-03-07 DIAGNOSIS — C50212 Malignant neoplasm of upper-inner quadrant of left female breast: Secondary | ICD-10-CM | POA: Diagnosis not present

## 2023-03-07 DIAGNOSIS — Z170 Estrogen receptor positive status [ER+]: Secondary | ICD-10-CM | POA: Diagnosis not present

## 2023-03-12 ENCOUNTER — Other Ambulatory Visit: Payer: Self-pay | Admitting: *Deleted

## 2023-03-12 ENCOUNTER — Inpatient Hospital Stay: Payer: Medicare PPO | Attending: Adult Health | Admitting: Adult Health

## 2023-03-12 VITALS — BP 147/74 | HR 82 | Temp 97.9°F | Resp 18 | Ht 63.5 in | Wt 153.3 lb

## 2023-03-12 DIAGNOSIS — N6452 Nipple discharge: Secondary | ICD-10-CM | POA: Diagnosis not present

## 2023-03-12 DIAGNOSIS — Z8041 Family history of malignant neoplasm of ovary: Secondary | ICD-10-CM | POA: Insufficient documentation

## 2023-03-12 DIAGNOSIS — Z170 Estrogen receptor positive status [ER+]: Secondary | ICD-10-CM

## 2023-03-12 DIAGNOSIS — Z79811 Long term (current) use of aromatase inhibitors: Secondary | ICD-10-CM | POA: Insufficient documentation

## 2023-03-12 DIAGNOSIS — Z8673 Personal history of transient ischemic attack (TIA), and cerebral infarction without residual deficits: Secondary | ICD-10-CM | POA: Insufficient documentation

## 2023-03-12 DIAGNOSIS — Z803 Family history of malignant neoplasm of breast: Secondary | ICD-10-CM | POA: Insufficient documentation

## 2023-03-12 DIAGNOSIS — Z171 Estrogen receptor negative status [ER-]: Secondary | ICD-10-CM | POA: Diagnosis not present

## 2023-03-12 DIAGNOSIS — Z8042 Family history of malignant neoplasm of prostate: Secondary | ICD-10-CM | POA: Diagnosis not present

## 2023-03-12 DIAGNOSIS — C50412 Malignant neoplasm of upper-outer quadrant of left female breast: Secondary | ICD-10-CM | POA: Insufficient documentation

## 2023-03-12 DIAGNOSIS — Z90710 Acquired absence of both cervix and uterus: Secondary | ICD-10-CM | POA: Diagnosis not present

## 2023-03-12 DIAGNOSIS — I10 Essential (primary) hypertension: Secondary | ICD-10-CM | POA: Insufficient documentation

## 2023-03-12 DIAGNOSIS — Z79899 Other long term (current) drug therapy: Secondary | ICD-10-CM | POA: Diagnosis not present

## 2023-03-12 DIAGNOSIS — C50012 Malignant neoplasm of nipple and areola, left female breast: Secondary | ICD-10-CM | POA: Diagnosis not present

## 2023-03-12 DIAGNOSIS — C50411 Malignant neoplasm of upper-outer quadrant of right female breast: Secondary | ICD-10-CM

## 2023-03-12 DIAGNOSIS — Z8249 Family history of ischemic heart disease and other diseases of the circulatory system: Secondary | ICD-10-CM | POA: Diagnosis not present

## 2023-03-12 DIAGNOSIS — Z923 Personal history of irradiation: Secondary | ICD-10-CM | POA: Diagnosis not present

## 2023-03-12 NOTE — Progress Notes (Signed)
[facility] Cancer Follow up:    [name], [name], MD [address] [place] [address]   DIAGNOSIS:  Cancer Staging  Malignant neoplasm of left breast, estrogen receptor positive (HCC) Staging form: Breast, AJCC 8th Edition - Pathologic stage from 07/13/2020: Stage IA (pT1c, pN0, cM0, G3, ER+, PR-, HER2+) - Signed by Loa Socks, NP on 07/28/2020 Stage prefix: Initial diagnosis Histologic grading system: 3 grade system - Pathologic stage from 08/03/2022: Stage 0 (rpTis (DCIS), pN0, cM0, ER+, PR-) - Signed by Loa Socks, NP on 11/06/2022 Stage prefix: Recurrence Nuclear grade: G3  Malignant neoplasm of upper-outer quadrant of right breast in female, estrogen receptor negative (HCC) Staging form: Breast, AJCC 8th Edition - Clinical stage from 06/27/2017: Stage IA (cT1b, cN0, cM0, G3, ER-, PR-, HER2+) - Signed by Lonie Peak, MD on 06/27/2017 Histologic grading system: 3 grade system Laterality: Right Stage used in treatment planning: Yes National guidelines used in treatment planning: Yes Type of national guideline used in treatment planning: NCCN Staging comments: Staged at breast conference on 1.9.19 - Pathologic: Stage IA (pT1c, pN0, cM0, G2, ER-, PR-, HER2+) - Unsigned Histologic grading system: 3 grade system   SUMMARY OF ONCOLOGIC HISTORY: Oncology History  Malignant neoplasm of upper-outer quadrant of right breast in female, estrogen receptor negative (HCC)  06/20/2017 Initial Diagnosis    [place] woman status post right breast upper outer quadrant biopsy 06/20/2017 for a clinical T1b N0, stage IA invasive ductal carcinoma, grade 3, estrogen and progesterone receptor negative, but HER-2 amplified, with an MIB-1 of 30%                  07/05/2017 Genetic Testing   The patient had genetic testing due to a personal history of breast cancer and family history of breast and ovarian cancer.  The Common Hereditary Cancer  Panel was ordered. The Common Hereditary Cancer Panel offered by [facility] includes sequencing and/or deletion duplication testing of the following 47 genes: APC, ATM, AXIN2, BARD1, BMPR1A, BRCA1, BRCA2, BRIP1, CDH1, CDKN2A (p14ARF), CDKN2A (p16INK4a), CKD4, CHEK2, CTNNA1, DICER1, EPCAM (Deletion/duplication testing only), GREM1 (promoter region deletion/duplication testing only), KIT, MEN1, MLH1, MSH2, MSH3, MSH6, MUTYH, NBN, NF1, NHTL1, PALB2, PDGFRA, PMS2, POLD1, POLE, PTEN, RAD50, RAD51C, RAD51D, SDHB, SDHC, SDHD, SMAD4, SMARCA4. STK11, TP53, TSC1, TSC2, and VHL.  The following genes were evaluated for sequence changes only: SDHA and HOXB13 c.251G>A variant only.  Results: No pathogenic variants identified.  A Variant of uncertain significance in the gene MSH2 was identified c.1331G>T (p.Arg444Leu).  The date of this test report is 07/05/2017.  UPDATE:MSH2 c.1331G>T (p.Arg444Leu) has been reclassified to Benign.  The amended report date is December 27, 2020.   07/17/2017 Surgery   status post right lumpectomy and sentinel lymph node sampling for a pT1c pN0, stage IA invasive ductal carcinoma, grade 2, with negative margins.  A total of 5 lymph nodes were removed    08/07/2017 - 07/23/2018 Adjuvant Chemotherapy   adjuvant chemotherapy consisting of paclitaxel weekly x12 together with trastuzumab every 21 days   10/17/2017 - 11/14/2017 Radiation Therapy   adjuvant radiation: 40.05 Gy directed to the Right Breast in 15 fractions, followed by a boost of 10 Gy given in 5 fractions    07/27/2020 - 12/30/2020 Chemotherapy   adjuvant chemotherapy and anti-HER2 immunotherapy started 07/27/2020, consisting of CMF chemotherapy to be repeated every 21 days x 8, completed 12/28/2020     07/27/2020 - 08/02/2021 Chemotherapy   Patient is on  Treatment Plan : BREAST Trastuzumab q21d     02/2021 -  Anti-estrogen oral therapy   Anastrozole daily X 5 years   07/06/2022 Relapse/Recurrence   Annual screening mammogram detected  new 0.3 cm group of calcifications in the right breast and a 1 cm group of calcifications in the left breast spanning 3.8 cm total, biopsy left breast: High-grade DCIS solid type with necrosis with focally suspicious for microinvasion ER 80%, PR 0%, right breast biopsy: CSL   08/03/2022 Surgery   Right lumpectomy: Focal ductal and stromal reactive atypia, remnants of CSL; left lumpectomy: Fibrocystic changes with UDH (no residual DCIS), margins negative, overall size based on biopsy 0.2 cm, ER 80%, PR 0%   Malignant neoplasm of left breast, estrogen receptor positive (HCC)  05/19/2017 Initial Diagnosis   Malignant neoplasm of left breast, estrogen receptor negative (HCC)   06/15/2020 Relapse/Recurrence   left breast upper inner quadrant biopsy 06/15/2020 for a clinical T1 N0 invasive ductal carcinoma, grade 3, weakly estrogen receptor positive, progesterone receptor negative, HER2 amplified, with an MIB-1 of 45%   07/13/2020 Cancer Staging   Staging form: Breast, AJCC 8th Edition - Pathologic stage from 07/13/2020: Stage IA (pT1c, pN0, cM0, G3, ER+, PR-, HER2+) - Signed by Loa Socks, NP on 07/28/2020 Stage prefix: Initial diagnosis Histologic grading system: 3 grade system   07/13/2020 Surgery   left lumpectomy and sentinel lymph node sampling 07/13/2020 for a pT1c pN0, stage IB invasive ductal carcinoma, grade 3, with negative margins, 5 axilla lymph nodes were removed, all benign, epeat prognostic panel again weakly estrogen receptor positive, progesterone receptor negative, now HER-2 not amplified   07/27/2020 - 12/28/2020 Adjuvant Chemotherapy   adjuvant chemotherapy and anti-HER2 immunotherapy started 07/27/2020, consisting of CMF chemotherapy to be repeated every 21 days x 8, completed 12/28/2020   09/09/2020 - 10/20/2020 Radiation Therapy   Site Technique Total Dose (Gy) Dose per Fx (Gy) Completed Fx Beam Energies  Breast, Left: Breast_Lt 3D 50/50 2 25/25 6X, 10X  Breast, Left:  Breast_Lt_Bst 3D 10/10 2 5/5 6X, 10X     07/06/2022 Relapse/Recurrence   Left breast biopsy: high grade DCIS, 1.65mm in greatest extent, suspicious for microinvasion Right Breast biopsy: CSL, 2mm, negative for carcinoma   08/03/2022 Surgery   Right lumpectomy: focal ductal and stromal reactive atypia adjacent to biopsy site, fibrocystic changes with usual ductal hyperplasia, focal changes consistent with remnants of CSL. Left lumpectomy: fibrocystic changes, no residual DCIS, margins negative, 0 LN biopsied, ER 80%, PR negative.     08/03/2022 Cancer Staging   Staging form: Breast, AJCC 8th Edition - Pathologic stage from 08/03/2022: Stage 0 (rpTis (DCIS), pN0, cM0, ER+, PR-) - Signed by Loa Socks, NP on 11/06/2022 Stage prefix: Recurrence Nuclear grade: G3   08/2022 -  Anti-estrogen oral therapy   Change from Anastrozole to Letrozole     CURRENT THERAPY: Letrozole  INTERVAL HISTORY: April [name] 72 y.o. female returns for follow-up and evaluation of left breast nipple drainage.  This has been going on since September 11 and had been progressively worsening.  She describes a yellowish drainage from her nipple noted mainly on the sheets.  She has a history of seromas in the left breast noted on most recent MRI in June 2024.  She is very concerned about her breast considering her history of breast cancer recurrences.  She is about to go out of town on a cruise and would appreciate imaging on her breast to evaluate this further.  Patient Active Problem List   Diagnosis Date Noted   Age-related osteoporosis without current pathological fracture 09/08/2020   Allergic rhinitis 09/08/2020   Palpitations 09/08/2020   Personal history of malignant neoplasm of breast 09/08/2020   Personal history of transient ischemic attack (TIA), and cerebral infarction without residual deficits 09/08/2020   Pure hypercholesterolemia 09/08/2020   Vitamin D deficiency 09/08/2020    Neuropathy due to chemotherapeutic drug (HCC) 07/23/2018   Port-A-Cath in place 08/28/2017   TIA (transient ischemic attack) 08/09/2017   Hypertension    Genetic testing 07/06/2017   Family history of ovarian cancer    Family history of breast cancer    Malignant neoplasm of upper-outer quadrant of right breast in female, estrogen receptor negative (HCC) 06/26/2017   Malignant neoplasm of left breast, estrogen receptor positive (HCC) 05/19/2017    is allergic to tape, betamethasone dipropionate, caffeine, cetirizine hcl, colesevelam, fosamax [alendronate], ibuprofen, lisinopril, loratadine, nsaids, statins, neosporin [bacitracin-polymyxin b], and sulfa antibiotics.  MEDICAL HISTORY: Past Medical History:  Diagnosis Date   Breast cancer (HCC) 05/2017   right breast   Breast cancer (HCC) 06/2020   left breast IDC   Eczema    Family history of breast cancer    Family history of ovarian cancer    History of radiation therapy 10/17/17- 11/14/17   40.05 directed to the right breast in 15 fractions, followed by a boost of 10 gy given in 5 fractions.    Hyperlipidemia    Hypertension    toxemia during pregnancy, no meds now    SURGICAL HISTORY: Past Surgical History:  Procedure Laterality Date   ABDOMINAL HYSTERECTOMY     BREAST LUMPECTOMY WITH RADIOACTIVE SEED AND SENTINEL LYMPH NODE BIOPSY Right 07/17/2017   Procedure: BREAST LUMPECTOMY WITH RADIOACTIVE SEED AND SENTINEL LYMPH NODE BIOPSY;  Surgeon: Emelia Loron, MD;  Location: [facility];  Service: General;  Laterality: Right;   BREAST LUMPECTOMY WITH RADIOACTIVE SEED AND SENTINEL LYMPH NODE BIOPSY Left 07/13/2020   Procedure: LEFT BREAST LUMPECTOMY WITH RADIOACTIVE SEED AND LEFT AXILLARY SENTINEL LYMPH NODE BIOPSY;  Surgeon: Emelia Loron, MD;  Location: [facility];  Service: General;  Laterality: Left;   BREAST LUMPECTOMY WITH RADIOACTIVE SEED LOCALIZATION Left 08/03/2022   Procedure: LEFT  BREAST BRACKETED LUMPECTOMY WITH RADIOACTIVE SEED LOCALIZATION;  Surgeon: Emelia Loron, MD;  Location: [facility];  Service: General;  Laterality: Left;   CESAREAN SECTION     x4   DILATION AND CURETTAGE OF UTERUS     EYE SURGERY Bilateral 2021   cataract removal   KNEE SURGERY Right    PORT-A-CATH REMOVAL Right 09/04/2018   Procedure: REMOVAL PORT-A-CATH;  Surgeon: Emelia Loron, MD;  Location: [facility];  Service: General;  Laterality: Right;   PORT-A-CATH REMOVAL Right 09/05/2021   Procedure: REMOVAL PORT-A-CATH;  Surgeon: Emelia Loron, MD;  Location: [facility];  Service: General;  Laterality: Right;   PORTACATH PLACEMENT N/A 07/17/2017   Procedure: INSERTION PORT-A-CATH WITH [place];  Surgeon: Emelia Loron, MD;  Location: [facility];  Service: General;  Laterality: N/A;   PORTACATH PLACEMENT Right 07/13/2020   Procedure: INSERTION PORT-A-CATH WITH ULTRASOUND GUIDANCE;  Surgeon: Emelia Loron, MD;  Location: [facility];  Service: General;  Laterality: Right;   RADIOACTIVE SEED GUIDED EXCISIONAL BREAST BIOPSY Right 08/03/2022   Procedure: RADIOACTIVE SEED GUIDED EXCISIONAL RIGHT BREAST BIOPSY;  Surgeon: Emelia Loron, MD;  Location: [facility] OR;  Service: General;  Laterality: Right;    SOCIAL  HISTORY: Social History   Socioeconomic History   Marital status: Married    Spouse name: Ezequiel Kayser   Number of children: 2   Years of education: Not on file   Highest education level: Not on file  Occupational History   Not on file  Tobacco Use   Smoking status: Never   Smokeless tobacco: Never  Vaping Use   Vaping status: Never Used  Substance and Sexual Activity   Alcohol use: Yes    Comment: social   Drug use: No   Sexual activity: Not Currently    Birth control/protection: Surgical  Other Topics Concern   Not on file  Social History Narrative   Not on file   Social Determinants of Health   Financial  Resource Strain: Not on file  Food Insecurity: Not on file  Transportation Needs: Not on file  Physical Activity: Not on file  Stress: Not on file  Social Connections: Not on file  Intimate Partner Violence: Not on file    FAMILY HISTORY: Family History  Problem Relation Age of Onset   Breast cancer Mother 72       again at 27 in other breast    Heart attack Father 93   Breast cancer Sister 58   Breast cancer Maternal Grandmother 56       spread to lungs, died at 104   Ovarian cancer Cousin 76   Prostate cancer Cousin     Review of Systems  Constitutional:  Negative for appetite change, chills, fatigue, fever and unexpected weight change.  HENT:   Negative for hearing loss, lump/mass and trouble swallowing.   Eyes:  Negative for eye problems and icterus.  Respiratory:  Negative for chest tightness, cough and shortness of breath.   Cardiovascular:  Negative for chest pain, leg swelling and palpitations.  Gastrointestinal:  Negative for abdominal distention, abdominal pain, constipation, diarrhea, nausea and vomiting.  Endocrine: Negative for hot flashes.  Genitourinary:  Negative for difficulty urinating.   Musculoskeletal:  Negative for arthralgias.  Skin:  Negative for itching and rash.  Neurological:  Negative for dizziness, extremity weakness, headaches and numbness.  Hematological:  Negative for adenopathy. Does not bruise/bleed easily.  Psychiatric/Behavioral:  Negative for depression. The patient is not nervous/anxious.       PHYSICAL EXAMINATION    Vitals:   03/12/23 1547  BP: (!) 147/74  Pulse: 82  Resp: 18  Temp: 97.9 F (36.6 C)  SpO2: 100%    Physical Exam Constitutional:      General: She is not in acute distress.    Appearance: Normal appearance. She is not toxic-appearing.  HENT:     Head: Normocephalic and atraumatic.     Mouth/Throat:     Mouth: Mucous membranes are moist.     Pharynx: Oropharynx is clear. No oropharyngeal exudate or  posterior oropharyngeal erythema.  Eyes:     General: No scleral icterus. Cardiovascular:     Rate and Rhythm: Normal rate and regular rhythm.     Pulses: Normal pulses.     Heart sounds: Normal heart sounds.  Pulmonary:     Effort: Pulmonary effort is normal.     Breath sounds: Normal breath sounds.  Chest:     Comments: Right breast status postlumpectomy and radiation no sign of cancer recurrence; left breast status postlumpectomy and radiation, the left breast is firm and there is an element of fluctuance just underneath the areola. Abdominal:     General: Abdomen is flat. Bowel sounds are  normal. There is no distension.     Palpations: Abdomen is soft.     Tenderness: There is no abdominal tenderness.  Musculoskeletal:        General: No swelling.     Cervical back: Neck supple.  Lymphadenopathy:     Cervical: No cervical adenopathy.  Skin:    General: Skin is warm and dry.     Findings: No rash.  Neurological:     General: No focal deficit present.     Mental Status: She is alert.  Psychiatric:        Mood and Affect: Mood normal.        Behavior: Behavior normal.     LABORATORY DATA:  CBC    Component Value Date/Time   WBC 4.7 07/26/2022 1404   RBC 4.05 07/26/2022 1404   HGB 11.7 (L) 07/26/2022 1404   HGB 10.4 (L) 08/02/2021 1407   HCT 37.3 07/26/2022 1404   PLT 246 07/26/2022 1404   PLT 214 08/02/2021 1407   MCV 92.1 07/26/2022 1404   MCH 28.9 07/26/2022 1404   MCHC 31.4 07/26/2022 1404   RDW 13.2 07/26/2022 1404   LYMPHSABS 1.0 08/02/2021 1407   MONOABS 0.4 08/02/2021 1407   EOSABS 0.1 08/02/2021 1407   BASOSABS 0.0 08/02/2021 1407    CMP     Component Value Date/Time   NA 139 07/26/2022 1404   K 4.0 07/26/2022 1404   CL 102 07/26/2022 1404   CO2 29 07/26/2022 1404   GLUCOSE 102 (H) 07/26/2022 1404   BUN 14 07/26/2022 1404   CREATININE 0.89 07/26/2022 1404   CREATININE 1.39 (H) 08/02/2021 1407   CALCIUM 10.1 07/26/2022 1404   PROT 7.4  08/02/2021 1407   ALBUMIN 4.0 08/02/2021 1407   AST 28 08/02/2021 1407   ALT 26 08/02/2021 1407   ALKPHOS 54 08/02/2021 1407   BILITOT 0.3 08/02/2021 1407   GFRNONAA >60 07/26/2022 1404   GFRNONAA 41 (L) 08/02/2021 1407   GFRAA >60 07/10/2019 1008   GFRAA >60 06/27/2017 0843       ASSESSMENT and THERAPY PLAN:   Malignant neoplasm of left breast, estrogen receptor positive (HCC) Nataleigh is a 72 year old woman with history of right breast cancer along with recurrent left breast cancer on letrozole therapy here today for follow-up and evaluation of nipple drainage.  I reviewed with Jacquelyne that her nipple drainage is most consistent with a seroma however due to her breast cancer recurrence and breast exam I placed orders for mammogram and ultrasound to ensure that this is the case.  We reviewed cerumen management and if it is not particularly bothersome the most effective thing to do would be to monitor the seroma as when the body takes care of it itself it typically stays away versus intervention which can lead to seroma recurrence along with infection and abscess.  Denina verbalized understanding of the plan and knows we will call her with her appointment date and time.   All questions were answered. The patient knows to call the clinic with any problems, questions or concerns. We can certainly see the patient much sooner if necessary.  Total encounter time:30 minutes*in face-to-face visit time, chart review, lab review, care coordination, order entry, and documentation of the encounter time.    Lillard Anes, NP 03/12/23 4:48 PM Medical Oncology and Hematology [facility] [address] [place], [place] [address] Tel. [phone]    Fax. [phone]  *Total Encounter Time as defined by the Centers for  Medicare and Medicaid Services includes, in addition to the face-to-face time of a patient visit (documented in the note above) non-face-to-face time: obtaining  and reviewing outside history, ordering and reviewing medications, tests or procedures, care coordination (communications with other health care professionals or caregivers) and documentation in the medical record.

## 2023-03-12 NOTE — Assessment & Plan Note (Signed)
April [name] is a 72 year old woman with history of right breast cancer along with recurrent left breast cancer on letrozole therapy here today for follow-up and evaluation of nipple drainage.  I reviewed with April [name] that her nipple drainage is most consistent with a seroma however due to her breast cancer recurrence and breast exam I placed orders for mammogram and ultrasound to ensure that this is the case.  We reviewed cerumen management and if it is not particularly bothersome the most effective thing to do would be to monitor the seroma as when the body takes care of it itself it typically stays away versus intervention which can lead to seroma recurrence along with infection and abscess.  April [name] verbalized understanding of the plan and knows we will call her with her appointment date and time.

## 2023-03-14 DIAGNOSIS — N6320 Unspecified lump in the left breast, unspecified quadrant: Secondary | ICD-10-CM | POA: Diagnosis not present

## 2023-03-14 DIAGNOSIS — Z853 Personal history of malignant neoplasm of breast: Secondary | ICD-10-CM | POA: Diagnosis not present

## 2023-03-14 DIAGNOSIS — N6452 Nipple discharge: Secondary | ICD-10-CM | POA: Diagnosis not present

## 2023-03-26 DIAGNOSIS — G8929 Other chronic pain: Secondary | ICD-10-CM | POA: Diagnosis not present

## 2023-03-26 DIAGNOSIS — M25561 Pain in right knee: Secondary | ICD-10-CM | POA: Diagnosis not present

## 2023-03-26 DIAGNOSIS — M25571 Pain in right ankle and joints of right foot: Secondary | ICD-10-CM | POA: Diagnosis not present

## 2023-04-23 DIAGNOSIS — M25561 Pain in right knee: Secondary | ICD-10-CM | POA: Diagnosis not present

## 2023-04-23 DIAGNOSIS — G8929 Other chronic pain: Secondary | ICD-10-CM | POA: Diagnosis not present

## 2023-05-08 NOTE — Assessment & Plan Note (Signed)
06/20/2017: Right breast cancer T1BN0 stage Ia grade 3, ER/PR negative, HER2 +0/5 lymph nodes negative (genetics negative), adjuvant chemo with Taxol Herceptin with Herceptin maintenance for 1 year, adjuvant radiation 06/15/2020: Left breast cancer T1N0 grade 3 IDC, weak ER positive, PR negative, biopsy had HER2 amplified with a Ki-67 of 45%, status post left lumpectomy: T1CN0 stage Ib grade 3, 0/5 lymph nodes (HER2 negative on final path) Adjuvant chemo with CMF Herceptin followed by Herceptin maintenance completed February 2023, XRT   Treatment plan: Adjuvant antiestrogen therapy with  anastrozole 1 mg daily  started 02/2021 Anastrozole toxicities: It appears that patient has a recurrence of DCIS in spite of taking anastrozole therapy.   Breast Cancer Surveillance:  Mammograms 06/29/22: new 0.3 cm calcs Right breast central to nipple middle depth suspicious for malignancy (biopsy recommended), 1 cm pleomorphic calcs left breast suspicious, total span 3.8 cm 2. Left breast Biopsy: HG DCIS, focally suspicious for microinvasion ER 80%, PR 0%; Right Breast biopsy: CSL, focal necrosis 3.  08/03/2022 right lumpectomy: Focal ductal and stromal reactive atypia, remnants of CSL; left lumpectomy: Fibrocystic changes with UDH (no residual DCIS), margins negative, overall size based on biopsy 0.2 cm, ER 80%, PR 0%  Current treatment: Switched from anastrozole to letrozole 08/14/2022 Letrozole toxicities: None

## 2023-05-09 ENCOUNTER — Inpatient Hospital Stay: Payer: Medicare PPO | Attending: Adult Health | Admitting: Hematology and Oncology

## 2023-05-09 VITALS — BP 147/59 | HR 85 | Temp 97.2°F | Resp 18 | Ht 63.5 in | Wt 155.2 lb

## 2023-05-09 DIAGNOSIS — Z888 Allergy status to other drugs, medicaments and biological substances status: Secondary | ICD-10-CM | POA: Diagnosis not present

## 2023-05-09 DIAGNOSIS — Z171 Estrogen receptor negative status [ER-]: Secondary | ICD-10-CM | POA: Diagnosis not present

## 2023-05-09 DIAGNOSIS — Z1722 Progesterone receptor negative status: Secondary | ICD-10-CM | POA: Insufficient documentation

## 2023-05-09 DIAGNOSIS — Z79899 Other long term (current) drug therapy: Secondary | ICD-10-CM | POA: Insufficient documentation

## 2023-05-09 DIAGNOSIS — G4700 Insomnia, unspecified: Secondary | ICD-10-CM | POA: Diagnosis not present

## 2023-05-09 DIAGNOSIS — Z79811 Long term (current) use of aromatase inhibitors: Secondary | ICD-10-CM | POA: Diagnosis not present

## 2023-05-09 DIAGNOSIS — C50411 Malignant neoplasm of upper-outer quadrant of right female breast: Secondary | ICD-10-CM | POA: Diagnosis not present

## 2023-05-09 DIAGNOSIS — R232 Flushing: Secondary | ICD-10-CM | POA: Diagnosis not present

## 2023-05-09 DIAGNOSIS — Z886 Allergy status to analgesic agent status: Secondary | ICD-10-CM | POA: Diagnosis not present

## 2023-05-09 DIAGNOSIS — C50412 Malignant neoplasm of upper-outer quadrant of left female breast: Secondary | ICD-10-CM | POA: Insufficient documentation

## 2023-05-09 DIAGNOSIS — Z882 Allergy status to sulfonamides status: Secondary | ICD-10-CM | POA: Insufficient documentation

## 2023-05-09 DIAGNOSIS — Z803 Family history of malignant neoplasm of breast: Secondary | ICD-10-CM | POA: Insufficient documentation

## 2023-05-09 DIAGNOSIS — Z170 Estrogen receptor positive status [ER+]: Secondary | ICD-10-CM | POA: Insufficient documentation

## 2023-05-09 MED ORDER — LETROZOLE 2.5 MG PO TABS: 2.5000 mg | ORAL_TABLET | Freq: Every day | ORAL | 3 refills | Status: DC

## 2023-05-09 NOTE — Progress Notes (Signed)
Patient Care Team: Ollen Bowl, MD as PCP - General (Internal Medicine) Emelia Loron, MD as Consulting Physician (General Surgery) Lonie Peak, MD as Attending Physician (Radiation Oncology) Nadara Mustard, MD as Consulting Physician (Orthopedic Surgery) Regal, Kirstie Peri, DPM as Consulting Physician (Podiatry) Lucie Leather, Alvira Philips, MD as Consulting Physician (Allergy and Immunology) Bensimhon, Bevelyn Buckles, MD as Consulting Physician (Cardiology) Serena Croissant, MD as Consulting Physician (Hematology and Oncology) Pershing Proud, RN as Oncology Nurse Navigator Donnelly Angelica, RN as Oncology Nurse Navigator  DIAGNOSIS:  Encounter Diagnosis  Name Primary?   Malignant neoplasm of upper-outer quadrant of right breast in female, estrogen receptor negative (HCC) Yes    SUMMARY OF ONCOLOGIC HISTORY: Oncology History  Malignant neoplasm of upper-outer quadrant of right breast in female, estrogen receptor negative (HCC)  06/20/2017 Initial Diagnosis    [place] woman status post right breast upper outer quadrant biopsy 06/20/2017 for a clinical T1b N0, stage IA invasive ductal carcinoma, grade 3, estrogen and progesterone receptor negative, but HER-2 amplified, with an MIB-1 of 30%                  07/05/2017 Genetic Testing   The patient had genetic testing due to a personal history of breast cancer and family history of breast and ovarian cancer.  The Common Hereditary Cancer Panel was ordered. The Common Hereditary Cancer Panel offered by [facility] includes sequencing and/or deletion duplication testing of the following 47 genes: APC, ATM, AXIN2, BARD1, BMPR1A, BRCA1, BRCA2, BRIP1, CDH1, CDKN2A (p14ARF), CDKN2A (p16INK4a), CKD4, CHEK2, CTNNA1, DICER1, EPCAM (Deletion/duplication testing only), GREM1 (promoter region deletion/duplication testing only), KIT, MEN1, MLH1, MSH2, MSH3, MSH6, MUTYH, NBN, NF1, NHTL1, PALB2, PDGFRA, PMS2, POLD1, POLE, PTEN, RAD50, RAD51C, RAD51D, SDHB, SDHC,  SDHD, SMAD4, SMARCA4. STK11, TP53, TSC1, TSC2, and VHL.  The following genes were evaluated for sequence changes only: SDHA and HOXB13 c.251G>A variant only.  Results: No pathogenic variants identified.  A Variant of uncertain significance in the gene MSH2 was identified c.1331G>T (p.Arg444Leu).  The date of this test report is 07/05/2017.  UPDATE:MSH2 c.1331G>T (p.Arg444Leu) has been reclassified to Benign.  The amended report date is December 27, 2020.   07/17/2017 Surgery   status post right lumpectomy and sentinel lymph node sampling for a pT1c pN0, stage IA invasive ductal carcinoma, grade 2, with negative margins.  A total of 5 lymph nodes were removed    08/07/2017 - 07/23/2018 Adjuvant Chemotherapy   adjuvant chemotherapy consisting of paclitaxel weekly x12 together with trastuzumab every 21 days   10/17/2017 - 11/14/2017 Radiation Therapy   adjuvant radiation: 40.05 Gy directed to the Right Breast in 15 fractions, followed by a boost of 10 Gy given in 5 fractions    07/27/2020 - 12/30/2020 Chemotherapy   adjuvant chemotherapy and anti-HER2 immunotherapy started 07/27/2020, consisting of CMF chemotherapy to be repeated every 21 days x 8, completed 12/28/2020     07/27/2020 - 08/02/2021 Chemotherapy   Patient is on Treatment Plan : BREAST Trastuzumab q21d     02/2021 -  Anti-estrogen oral therapy   Anastrozole daily X 5 years   07/06/2022 Relapse/Recurrence   Annual screening mammogram detected new 0.3 cm group of calcifications in the right breast and a 1 cm group of calcifications in the left breast spanning 3.8 cm total, biopsy left breast: High-grade DCIS solid type with necrosis with focally suspicious for microinvasion ER 80%, PR 0%, right breast biopsy: CSL   08/03/2022 Surgery   Right lumpectomy: Focal ductal  and stromal reactive atypia, remnants of CSL; left lumpectomy: Fibrocystic changes with UDH (no residual DCIS), margins negative, overall size based on biopsy 0.2 cm, ER 80%, PR 0%    Malignant neoplasm of left breast, estrogen receptor positive (HCC)  05/19/2017 Initial Diagnosis   Malignant neoplasm of left breast, estrogen receptor negative (HCC)   06/15/2020 Relapse/Recurrence   left breast upper inner quadrant biopsy 06/15/2020 for a clinical T1 N0 invasive ductal carcinoma, grade 3, weakly estrogen receptor positive, progesterone receptor negative, HER2 amplified, with an MIB-1 of 45%   07/13/2020 Cancer Staging   Staging form: Breast, AJCC 8th Edition - Pathologic stage from 07/13/2020: Stage IA (pT1c, pN0, cM0, G3, ER+, PR-, HER2+) - Signed by Loa Socks, NP on 07/28/2020 Stage prefix: Initial diagnosis Histologic grading system: 3 grade system   07/13/2020 Surgery   left lumpectomy and sentinel lymph node sampling 07/13/2020 for a pT1c pN0, stage IB invasive ductal carcinoma, grade 3, with negative margins, 5 axilla lymph nodes were removed, all benign, epeat prognostic panel again weakly estrogen receptor positive, progesterone receptor negative, now HER-2 not amplified   07/27/2020 - 12/28/2020 Adjuvant Chemotherapy   adjuvant chemotherapy and anti-HER2 immunotherapy started 07/27/2020, consisting of CMF chemotherapy to be repeated every 21 days x 8, completed 12/28/2020   09/09/2020 - 10/20/2020 Radiation Therapy   Site Technique Total Dose (Gy) Dose per Fx (Gy) Completed Fx Beam Energies  Breast, Left: Breast_Lt 3D 50/50 2 25/25 6X, 10X  Breast, Left: Breast_Lt_Bst 3D 10/10 2 5/5 6X, 10X     07/06/2022 Relapse/Recurrence   Left breast biopsy: high grade DCIS, 1.92mm in greatest extent, suspicious for microinvasion Right Breast biopsy: CSL, 2mm, negative for carcinoma   08/03/2022 Surgery   Right lumpectomy: focal ductal and stromal reactive atypia adjacent to biopsy site, fibrocystic changes with usual ductal hyperplasia, focal changes consistent with remnants of CSL. Left lumpectomy: fibrocystic changes, no residual DCIS, margins negative, 0 LN  biopsied, ER 80%, PR negative.     08/03/2022 Cancer Staging   Staging form: Breast, AJCC 8th Edition - Pathologic stage from 08/03/2022: Stage 0 (rpTis (DCIS), pN0, cM0, ER+, PR-) - Signed by Loa Socks, NP on 11/06/2022 Stage prefix: Recurrence Nuclear grade: G3   08/2022 -  Anti-estrogen oral therapy   Change from Anastrozole to Letrozole     CHIEF COMPLIANT: F/U on Letrozole  HISTORY OF PRESENT ILLNESS: History of Present Illness   The patient, with a history of breast cancer, has been on letrozole for almost two years. She reports experiencing hot flashes, which she manages well. However, she has noticed some insomnia, staying up late and waking up after about three hours of sleep to use the bathroom. She finds it difficult to fall back asleep. The patient also reports a history of fluid leakage from the breast, which started around mid-September and resolved by the second week of October. The patient describes the breast as initially feeling very tight and swollen, but it has since softened.         ALLERGIES:  is allergic to tape, betamethasone dipropionate, caffeine, cetirizine hcl, colesevelam, fosamax [alendronate], ibuprofen, lisinopril, loratadine, nsaids, statins, neosporin [bacitracin-polymyxin b], and sulfa antibiotics.  MEDICATIONS:  Current Outpatient Medications  Medication Sig Dispense Refill   aspirin 81 MG chewable tablet Chew 81 mg by mouth daily.     Calcium Carbonate-Vitamin D (CALCIUM-VITAMIN D3 PO) Take 1,200 mg by mouth daily.     co-enzyme Q-10 30 MG capsule Take 30 mg by mouth  daily.     letrozole (FEMARA) 2.5 MG tablet Take 1 tablet (2.5 mg total) by mouth daily. 90 tablet 3   Omega-3 Fatty Acids (FISH OIL) 1000 MG CAPS Take 1,000 mg by mouth daily.     Pyridoxine HCl (VITAMIN B6) 250 MG TABS Take 250 mg by mouth daily.     Red Yeast Rice Extract (RED YEAST RICE PO) Take 1 capsule by mouth daily.     Specialty Vitamins Products (VITAMINS FOR  HAIR PO) Take 2 each by mouth once.     vitamin B-12 (CYANOCOBALAMIN) 1000 MCG tablet Take 1,000 mcg by mouth daily.     vitamin C (ASCORBIC ACID) 250 MG tablet Take 250 mg by mouth daily.     zinc gluconate 50 MG tablet Take 50 mg by mouth daily.     No current facility-administered medications for this visit.    PHYSICAL EXAMINATION: ECOG PERFORMANCE STATUS: 1 - Symptomatic but completely ambulatory  Vitals:   05/09/23 1151  BP: (!) 147/59  Pulse: 85  Resp: 18  Temp: (!) 97.2 F (36.2 C)  SpO2: 100%   Filed Weights   05/09/23 1151  Weight: 155 lb 3.2 oz (70.4 kg)    LABORATORY DATA:  I have reviewed the data as listed    Latest Ref Rng & Units 07/26/2022    2:04 PM 08/29/2021    3:30 PM 08/02/2021    2:07 PM  CMP  Glucose 70 - 99 mg/dL 295  621  95   BUN 8 - 23 mg/dL 14  11  14    Creatinine 0.44 - 1.00 mg/dL 3.08  6.57  8.46   Sodium 135 - 145 mmol/L 139  142  138   Potassium 3.5 - 5.1 mmol/L 4.0  4.4  3.9   Chloride 98 - 111 mmol/L 102  104  103   CO2 22 - 32 mmol/L 29  30  28    Calcium 8.9 - 10.3 mg/dL 96.2  9.9  9.5   Total Protein 6.5 - 8.1 g/dL   7.4   Total Bilirubin 0.3 - 1.2 mg/dL   0.3   Alkaline Phos 38 - 126 U/L   54   AST 15 - 41 U/L   28   ALT 0 - 44 U/L   26     Lab Results  Component Value Date   WBC 4.7 07/26/2022   HGB 11.7 (L) 07/26/2022   HCT 37.3 07/26/2022   MCV 92.1 07/26/2022   PLT 246 07/26/2022   NEUTROABS 2.0 08/02/2021    ASSESSMENT & PLAN:  Malignant neoplasm of upper-outer quadrant of right breast in female, estrogen receptor negative (HCC) 06/20/2017: Right breast cancer T1BN0 stage Ia grade 3, ER/PR negative, HER2 +0/5 lymph nodes negative (genetics negative), adjuvant chemo with Taxol Herceptin with Herceptin maintenance for 1 year, adjuvant radiation 06/15/2020: Left breast cancer T1N0 grade 3 IDC, weak ER positive, PR negative, biopsy had HER2 amplified with a Ki-67 of 45%, status post left lumpectomy: T1CN0 stage Ib grade 3,  0/5 lymph nodes (HER2 negative on final path) Adjuvant chemo with CMF Herceptin followed by Herceptin maintenance completed February 2023, XRT   Treatment plan: Adjuvant antiestrogen therapy with  anastrozole 1 mg daily  started 02/2021 Anastrozole toxicities: It appears that patient has a recurrence of DCIS in spite of taking anastrozole therapy.   Breast Cancer Surveillance:  Mammograms 06/29/22: new 0.3 cm calcs Right breast central to nipple middle depth suspicious for malignancy (biopsy recommended), 1 cm  pleomorphic calcs left breast suspicious, total span 3.8 cm 2. Left breast Biopsy: HG DCIS, focally suspicious for microinvasion ER 80%, PR 0%; Right Breast biopsy: CSL, focal necrosis 3.  08/03/2022 right lumpectomy: Focal ductal and stromal reactive atypia, remnants of CSL; left lumpectomy: Fibrocystic changes with UDH (no residual DCIS), margins negative, overall size based on biopsy 0.2 cm, ER 80%, PR 0%  Current treatment: Switched from anastrozole to letrozole 08/14/2022 Letrozole toxicities: None  Breast Seroma Noted fluid leakage from breast stoma, which has decreased since October. No discomfort reported. -Continue to monitor seroma. -Plan for mammogram in January to assess any changes.  -Next follow-up appointment in one year.          No orders of the defined types were placed in this encounter.  The patient has a good understanding of the overall plan. she agrees with it. she will call with any problems that may develop before the next visit here. Total time spent: 30 mins including face to face time and time spent for planning, charting and co-ordination of care   Tamsen Meek, MD 05/09/23

## 2023-05-29 DIAGNOSIS — M810 Age-related osteoporosis without current pathological fracture: Secondary | ICD-10-CM | POA: Diagnosis not present

## 2023-05-29 DIAGNOSIS — I10 Essential (primary) hypertension: Secondary | ICD-10-CM | POA: Diagnosis not present

## 2023-05-29 DIAGNOSIS — Z8673 Personal history of transient ischemic attack (TIA), and cerebral infarction without residual deficits: Secondary | ICD-10-CM | POA: Diagnosis not present

## 2023-05-29 DIAGNOSIS — C50912 Malignant neoplasm of unspecified site of left female breast: Secondary | ICD-10-CM | POA: Diagnosis not present

## 2023-05-29 DIAGNOSIS — E559 Vitamin D deficiency, unspecified: Secondary | ICD-10-CM | POA: Diagnosis not present

## 2023-05-29 DIAGNOSIS — M1710 Unilateral primary osteoarthritis, unspecified knee: Secondary | ICD-10-CM | POA: Diagnosis not present

## 2023-05-29 DIAGNOSIS — J309 Allergic rhinitis, unspecified: Secondary | ICD-10-CM | POA: Diagnosis not present

## 2023-05-29 DIAGNOSIS — E7800 Pure hypercholesterolemia, unspecified: Secondary | ICD-10-CM | POA: Diagnosis not present

## 2023-07-02 DIAGNOSIS — R069 Unspecified abnormalities of breathing: Secondary | ICD-10-CM | POA: Diagnosis not present

## 2023-07-02 DIAGNOSIS — Z853 Personal history of malignant neoplasm of breast: Secondary | ICD-10-CM | POA: Diagnosis not present

## 2023-07-02 DIAGNOSIS — N6452 Nipple discharge: Secondary | ICD-10-CM | POA: Diagnosis not present

## 2023-07-02 DIAGNOSIS — L7634 Postprocedural seroma of skin and subcutaneous tissue following other procedure: Secondary | ICD-10-CM | POA: Diagnosis not present

## 2023-07-03 DIAGNOSIS — H43811 Vitreous degeneration, right eye: Secondary | ICD-10-CM | POA: Diagnosis not present

## 2023-07-03 DIAGNOSIS — H16223 Keratoconjunctivitis sicca, not specified as Sjogren's, bilateral: Secondary | ICD-10-CM | POA: Diagnosis not present

## 2023-07-03 DIAGNOSIS — Z961 Presence of intraocular lens: Secondary | ICD-10-CM | POA: Diagnosis not present

## 2023-07-03 DIAGNOSIS — H401121 Primary open-angle glaucoma, left eye, mild stage: Secondary | ICD-10-CM | POA: Diagnosis not present

## 2023-07-04 ENCOUNTER — Encounter: Payer: Self-pay | Admitting: Hematology and Oncology

## 2023-07-17 DIAGNOSIS — D84821 Immunodeficiency due to drugs: Secondary | ICD-10-CM | POA: Diagnosis not present

## 2023-07-17 DIAGNOSIS — Z7982 Long term (current) use of aspirin: Secondary | ICD-10-CM | POA: Diagnosis not present

## 2023-07-17 DIAGNOSIS — E785 Hyperlipidemia, unspecified: Secondary | ICD-10-CM | POA: Diagnosis not present

## 2023-07-17 DIAGNOSIS — I10 Essential (primary) hypertension: Secondary | ICD-10-CM | POA: Diagnosis not present

## 2023-07-17 DIAGNOSIS — D84822 Immunodeficiency due to external causes: Secondary | ICD-10-CM | POA: Diagnosis not present

## 2023-07-17 DIAGNOSIS — H409 Unspecified glaucoma: Secondary | ICD-10-CM | POA: Diagnosis not present

## 2023-07-17 DIAGNOSIS — G620 Drug-induced polyneuropathy: Secondary | ICD-10-CM | POA: Diagnosis not present

## 2023-07-17 DIAGNOSIS — M3500 Sicca syndrome, unspecified: Secondary | ICD-10-CM | POA: Diagnosis not present

## 2023-07-17 DIAGNOSIS — C50919 Malignant neoplasm of unspecified site of unspecified female breast: Secondary | ICD-10-CM | POA: Diagnosis not present

## 2023-08-02 DIAGNOSIS — H401121 Primary open-angle glaucoma, left eye, mild stage: Secondary | ICD-10-CM | POA: Diagnosis not present

## 2023-08-02 DIAGNOSIS — H40011 Open angle with borderline findings, low risk, right eye: Secondary | ICD-10-CM | POA: Diagnosis not present

## 2023-09-05 DIAGNOSIS — C50212 Malignant neoplasm of upper-inner quadrant of left female breast: Secondary | ICD-10-CM | POA: Diagnosis not present

## 2023-09-05 DIAGNOSIS — Z171 Estrogen receptor negative status [ER-]: Secondary | ICD-10-CM | POA: Diagnosis not present

## 2023-09-05 DIAGNOSIS — Z170 Estrogen receptor positive status [ER+]: Secondary | ICD-10-CM | POA: Diagnosis not present

## 2023-09-05 DIAGNOSIS — C50411 Malignant neoplasm of upper-outer quadrant of right female breast: Secondary | ICD-10-CM | POA: Diagnosis not present

## 2023-11-21 DIAGNOSIS — E7800 Pure hypercholesterolemia, unspecified: Secondary | ICD-10-CM | POA: Diagnosis not present

## 2023-11-21 DIAGNOSIS — R7303 Prediabetes: Secondary | ICD-10-CM | POA: Diagnosis not present

## 2023-11-21 DIAGNOSIS — E559 Vitamin D deficiency, unspecified: Secondary | ICD-10-CM | POA: Diagnosis not present

## 2023-11-21 DIAGNOSIS — C50912 Malignant neoplasm of unspecified site of left female breast: Secondary | ICD-10-CM | POA: Diagnosis not present

## 2023-11-21 DIAGNOSIS — Z0000 Encounter for general adult medical examination without abnormal findings: Secondary | ICD-10-CM | POA: Diagnosis not present

## 2023-11-21 DIAGNOSIS — M810 Age-related osteoporosis without current pathological fracture: Secondary | ICD-10-CM | POA: Diagnosis not present

## 2023-11-21 DIAGNOSIS — Z8673 Personal history of transient ischemic attack (TIA), and cerebral infarction without residual deficits: Secondary | ICD-10-CM | POA: Diagnosis not present

## 2023-11-21 DIAGNOSIS — I10 Essential (primary) hypertension: Secondary | ICD-10-CM | POA: Diagnosis not present

## 2023-11-21 DIAGNOSIS — D649 Anemia, unspecified: Secondary | ICD-10-CM | POA: Diagnosis not present

## 2023-11-21 DIAGNOSIS — J309 Allergic rhinitis, unspecified: Secondary | ICD-10-CM | POA: Diagnosis not present

## 2024-01-04 ENCOUNTER — Ambulatory Visit: Admitting: Podiatry

## 2024-01-09 ENCOUNTER — Ambulatory Visit: Admitting: Podiatry

## 2024-01-15 DIAGNOSIS — H40011 Open angle with borderline findings, low risk, right eye: Secondary | ICD-10-CM | POA: Diagnosis not present

## 2024-01-15 DIAGNOSIS — H401121 Primary open-angle glaucoma, left eye, mild stage: Secondary | ICD-10-CM | POA: Diagnosis not present

## 2024-01-16 ENCOUNTER — Ambulatory Visit: Admitting: Podiatry

## 2024-01-16 ENCOUNTER — Ambulatory Visit (INDEPENDENT_AMBULATORY_CARE_PROVIDER_SITE_OTHER)

## 2024-01-16 DIAGNOSIS — M779 Enthesopathy, unspecified: Secondary | ICD-10-CM

## 2024-01-16 DIAGNOSIS — M778 Other enthesopathies, not elsewhere classified: Secondary | ICD-10-CM

## 2024-01-16 DIAGNOSIS — M7751 Other enthesopathy of right foot: Secondary | ICD-10-CM | POA: Diagnosis not present

## 2024-01-16 DIAGNOSIS — L309 Dermatitis, unspecified: Secondary | ICD-10-CM | POA: Diagnosis not present

## 2024-01-16 MED ORDER — TRIAMCINOLONE ACETONIDE 10 MG/ML IJ SUSP
10.0000 mg | Freq: Once | INTRAMUSCULAR | Status: AC
  Administered 2024-01-16: 10 mg via INTRA_ARTICULAR

## 2024-01-17 NOTE — Progress Notes (Signed)
 Subjective:   Patient ID: April [name], female   DOB: 73 y.o.   MRN: [id number]   HPI Patient states getting some aching and itching in the medial side of the left heel and also some pain in the dorsal foot with prolonged pressure on the right foot.  Does not remember specific injury states the left is quite irritating and patient does not smoke likes to be active   Review of Systems  All other systems reviewed and are negative.       Objective:  Physical Exam Vitals and nursing note reviewed.  Constitutional:      Appearance: She is well-developed.  Pulmonary:     Effort: Pulmonary effort is normal.  Musculoskeletal:        General: Normal range of motion.  Skin:    General: Skin is warm.  Neurological:     Mental Status: She is alert.     Neurovascular status was found to be intact muscle strength is moderately reduced range of motion subtalar joint moderately reduced.  Patient is noted to have inflammation moderate discomfort of the medial ankle and all the right  foot found to have irritation to the dorsal tendon complex and subtalar joint.  It is mild on the right moderate on the left with also patient found to have moderate obesity and moderate collapse medial longitudinal arch.  Patient has good digital perfusion well-oriented x 3     Assessment:  Probability for posterior tibial tendinitis left is bothersome along with right dorsal irritation of the foot of the low-grade nature     Plan:  H&P x-ray taken reviewed in today for the left I went ahead and I carefully injected the tendon area of the posterior tib after explaining risk 3 mg dexamethasone  Kenalog  5 mg Xylocaine  applied sterile dressing advised on reduced activity.  Do not recommend current treatment of the right may be necessary  X-rays were negative for signs of fracture with moderate arthritis and depression of the arch noted bilateral

## 2024-01-22 DIAGNOSIS — C50212 Malignant neoplasm of upper-inner quadrant of left female breast: Secondary | ICD-10-CM | POA: Diagnosis not present

## 2024-01-22 DIAGNOSIS — M25511 Pain in right shoulder: Secondary | ICD-10-CM | POA: Diagnosis not present

## 2024-01-22 DIAGNOSIS — Z170 Estrogen receptor positive status [ER+]: Secondary | ICD-10-CM | POA: Diagnosis not present

## 2024-01-30 DIAGNOSIS — S46011D Strain of muscle(s) and tendon(s) of the rotator cuff of right shoulder, subsequent encounter: Secondary | ICD-10-CM | POA: Diagnosis not present

## 2024-02-06 DIAGNOSIS — S46011D Strain of muscle(s) and tendon(s) of the rotator cuff of right shoulder, subsequent encounter: Secondary | ICD-10-CM | POA: Diagnosis not present

## 2024-02-14 DIAGNOSIS — S46011D Strain of muscle(s) and tendon(s) of the rotator cuff of right shoulder, subsequent encounter: Secondary | ICD-10-CM | POA: Diagnosis not present

## 2024-02-19 DIAGNOSIS — H40011 Open angle with borderline findings, low risk, right eye: Secondary | ICD-10-CM | POA: Diagnosis not present

## 2024-02-19 DIAGNOSIS — H401121 Primary open-angle glaucoma, left eye, mild stage: Secondary | ICD-10-CM | POA: Diagnosis not present

## 2024-02-20 DIAGNOSIS — S46011D Strain of muscle(s) and tendon(s) of the rotator cuff of right shoulder, subsequent encounter: Secondary | ICD-10-CM | POA: Diagnosis not present

## 2024-02-21 DIAGNOSIS — E7800 Pure hypercholesterolemia, unspecified: Secondary | ICD-10-CM | POA: Diagnosis not present

## 2024-02-25 DIAGNOSIS — D2262 Melanocytic nevi of left upper limb, including shoulder: Secondary | ICD-10-CM | POA: Diagnosis not present

## 2024-02-25 DIAGNOSIS — D2372 Other benign neoplasm of skin of left lower limb, including hip: Secondary | ICD-10-CM | POA: Diagnosis not present

## 2024-02-25 DIAGNOSIS — L821 Other seborrheic keratosis: Secondary | ICD-10-CM | POA: Diagnosis not present

## 2024-02-25 DIAGNOSIS — L815 Leukoderma, not elsewhere classified: Secondary | ICD-10-CM | POA: Diagnosis not present

## 2024-02-28 DIAGNOSIS — Z23 Encounter for immunization: Secondary | ICD-10-CM | POA: Diagnosis not present

## 2024-03-25 DIAGNOSIS — H40011 Open angle with borderline findings, low risk, right eye: Secondary | ICD-10-CM | POA: Diagnosis not present

## 2024-03-25 DIAGNOSIS — H401121 Primary open-angle glaucoma, left eye, mild stage: Secondary | ICD-10-CM | POA: Diagnosis not present

## 2024-05-08 ENCOUNTER — Inpatient Hospital Stay: Payer: Medicare PPO | Attending: Hematology and Oncology | Admitting: Hematology and Oncology

## 2024-05-08 VITALS — BP 155/57 | HR 60 | Temp 98.3°F | Resp 18 | Ht 63.5 in | Wt 153.1 lb

## 2024-05-08 DIAGNOSIS — Z7982 Long term (current) use of aspirin: Secondary | ICD-10-CM | POA: Diagnosis not present

## 2024-05-08 DIAGNOSIS — Z882 Allergy status to sulfonamides status: Secondary | ICD-10-CM | POA: Diagnosis not present

## 2024-05-08 DIAGNOSIS — Z803 Family history of malignant neoplasm of breast: Secondary | ICD-10-CM | POA: Insufficient documentation

## 2024-05-08 DIAGNOSIS — Z1722 Progesterone receptor negative status: Secondary | ICD-10-CM | POA: Diagnosis not present

## 2024-05-08 DIAGNOSIS — C50212 Malignant neoplasm of upper-inner quadrant of left female breast: Secondary | ICD-10-CM | POA: Diagnosis not present

## 2024-05-08 DIAGNOSIS — N951 Menopausal and female climacteric states: Secondary | ICD-10-CM | POA: Insufficient documentation

## 2024-05-08 DIAGNOSIS — Z79811 Long term (current) use of aromatase inhibitors: Secondary | ICD-10-CM | POA: Insufficient documentation

## 2024-05-08 DIAGNOSIS — C50411 Malignant neoplasm of upper-outer quadrant of right female breast: Secondary | ICD-10-CM | POA: Diagnosis not present

## 2024-05-08 DIAGNOSIS — Z881 Allergy status to other antibiotic agents status: Secondary | ICD-10-CM | POA: Diagnosis not present

## 2024-05-08 DIAGNOSIS — Z171 Estrogen receptor negative status [ER-]: Secondary | ICD-10-CM | POA: Diagnosis not present

## 2024-05-08 DIAGNOSIS — N6011 Diffuse cystic mastopathy of right breast: Secondary | ICD-10-CM | POA: Diagnosis not present

## 2024-05-08 DIAGNOSIS — Z79899 Other long term (current) drug therapy: Secondary | ICD-10-CM | POA: Insufficient documentation

## 2024-05-08 DIAGNOSIS — Z888 Allergy status to other drugs, medicaments and biological substances status: Secondary | ICD-10-CM | POA: Insufficient documentation

## 2024-05-08 DIAGNOSIS — Z170 Estrogen receptor positive status [ER+]: Secondary | ICD-10-CM | POA: Diagnosis not present

## 2024-05-08 DIAGNOSIS — Z9221 Personal history of antineoplastic chemotherapy: Secondary | ICD-10-CM | POA: Insufficient documentation

## 2024-05-08 DIAGNOSIS — Z886 Allergy status to analgesic agent status: Secondary | ICD-10-CM | POA: Insufficient documentation

## 2024-05-08 DIAGNOSIS — Z79631 Long term (current) use of antimetabolite agent: Secondary | ICD-10-CM | POA: Diagnosis not present

## 2024-05-08 DIAGNOSIS — Z780 Asymptomatic menopausal state: Secondary | ICD-10-CM

## 2024-05-08 DIAGNOSIS — Z1732 Human epidermal growth factor receptor 2 negative status: Secondary | ICD-10-CM | POA: Insufficient documentation

## 2024-05-08 MED ORDER — LETROZOLE 2.5 MG PO TABS: 2.5000 mg | ORAL_TABLET | Freq: Every day | ORAL | 3 refills | Status: AC

## 2024-05-08 MED ORDER — PRAVASTATIN SODIUM 20 MG PO TABS: 20.0000 mg | ORAL_TABLET | Freq: Every day | ORAL | Status: AC

## 2024-05-08 NOTE — Progress Notes (Signed)
 Patient Care Team: Vernon Velna SAUNDERS, MD as PCP - General (Internal Medicine) Ebbie Cough, MD as Consulting Physician (General Surgery) Izell Domino, MD as Attending Physician (Radiation Oncology) Harden Jerona GAILS, MD as Consulting Physician (Orthopedic Surgery) Regal, Pasco RAMAN, DPM as Consulting Physician (Podiatry) Maurilio, Camellia PARAS, MD as Consulting Physician (Allergy and Immunology) Bensimhon, Toribio SAUNDERS, MD as Consulting Physician (Cardiology) Odean Potts, MD as Consulting Physician (Hematology and Oncology) Tyree Nanetta SAILOR, RN as Oncology Nurse Navigator  DIAGNOSIS:  Encounter Diagnosis  Name Primary?   Malignant neoplasm of upper-outer quadrant of right breast in female, estrogen receptor negative (HCC) Yes    SUMMARY OF ONCOLOGIC HISTORY: Oncology History  Malignant neoplasm of upper-outer quadrant of right breast in female, estrogen receptor negative (HCC)  06/20/2017 Initial Diagnosis    [place]  woman status post right breast upper outer quadrant biopsy 06/20/2017 for a clinical T1b N0, stage IA invasive ductal carcinoma, grade 3, estrogen and progesterone receptor negative, but HER-2 amplified, with an MIB-1 of 30%                  07/05/2017 Genetic Testing   The patient had genetic testing due to a personal history of breast cancer and family history of breast and ovarian cancer.  The Common Hereditary Cancer Panel was ordered. The Common Hereditary Cancer Panel offered by [facility] includes sequencing and/or deletion duplication testing of the following 47 genes: APC, ATM, AXIN2, BARD1, BMPR1A, BRCA1, BRCA2, BRIP1, CDH1, CDKN2A (p14ARF), CDKN2A (p16INK4a), CKD4, CHEK2, CTNNA1, DICER1, EPCAM (Deletion/duplication testing only), GREM1 (promoter region deletion/duplication testing only), KIT, MEN1, MLH1, MSH2, MSH3, MSH6, MUTYH, NBN, NF1, NHTL1, PALB2, PDGFRA, PMS2, POLD1, POLE, PTEN, RAD50, RAD51C, RAD51D, SDHB, SDHC, SDHD, SMAD4, SMARCA4. STK11, TP53, TSC1, TSC2, and  VHL.  The following genes were evaluated for sequence changes only: SDHA and HOXB13 c.251G>A variant only.  Results: No pathogenic variants identified.  A Variant of uncertain significance in the gene MSH2 was identified c.1331G>T (p.Arg444Leu).  The date of this test report is 07/05/2017.  UPDATE:MSH2 c.1331G>T (p.Arg444Leu) has been reclassified to Benign.  The amended report date is December 27, 2020.   07/17/2017 Surgery   status post right lumpectomy and sentinel lymph node sampling for a pT1c pN0, stage IA invasive ductal carcinoma, grade 2, with negative margins.  A total of 5 lymph nodes were removed    08/07/2017 - 07/23/2018 Adjuvant Chemotherapy   adjuvant chemotherapy consisting of paclitaxel  weekly x12 together with trastuzumab  every 21 days   10/17/2017 - 11/14/2017 Radiation Therapy   adjuvant radiation: 40.05 Gy directed to the Right Breast in 15 fractions, followed by a boost of 10 Gy given in 5 fractions    07/27/2020 - 12/30/2020 Chemotherapy   adjuvant chemotherapy and anti-HER2 immunotherapy started 07/27/2020, consisting of CMF chemotherapy to be repeated every 21 days x 8, completed 12/28/2020     07/27/2020 - 08/02/2021 Chemotherapy   Patient is on Treatment Plan : BREAST Trastuzumab  q21d     02/2021 -  Anti-estrogen oral therapy   Anastrozole  daily X 5 years   07/06/2022 Relapse/Recurrence   Annual screening mammogram detected new 0.3 cm group of calcifications in the right breast and a 1 cm group of calcifications in the left breast spanning 3.8 cm total, biopsy left breast: High-grade DCIS solid type with necrosis with focally suspicious for microinvasion ER 80%, PR 0%, right breast biopsy: CSL   08/03/2022 Surgery   Right lumpectomy: Focal ductal and stromal reactive atypia, remnants of CSL; left  lumpectomy: Fibrocystic changes with UDH (no residual DCIS), margins negative, overall size based on biopsy 0.2 cm, ER 80%, PR 0%   Malignant neoplasm of left breast, estrogen receptor  positive (HCC)  05/19/2017 Initial Diagnosis   Malignant neoplasm of left breast, estrogen receptor negative (HCC)   06/15/2020 Relapse/Recurrence   left breast upper inner quadrant biopsy 06/15/2020 for a clinical T1 N0 invasive ductal carcinoma, grade 3, weakly estrogen receptor positive, progesterone receptor negative, HER2 amplified, with an MIB-1 of 45%   07/13/2020 Cancer Staging   Staging form: Breast, AJCC 8th Edition - Pathologic stage from 07/13/2020: Stage IA (pT1c, pN0, cM0, G3, ER+, PR-, HER2+) - Signed by Crawford Morna Pickle, NP on 07/28/2020 Stage prefix: Initial diagnosis Histologic grading system: 3 grade system   07/13/2020 Surgery   left lumpectomy and sentinel lymph node sampling 07/13/2020 for a pT1c pN0, stage IB invasive ductal carcinoma, grade 3, with negative margins, 5 axilla lymph nodes were removed, all benign, epeat prognostic panel again weakly estrogen receptor positive, progesterone receptor negative, now HER-2 not amplified   07/27/2020 - 12/28/2020 Adjuvant Chemotherapy   adjuvant chemotherapy and anti-HER2 immunotherapy started 07/27/2020, consisting of CMF chemotherapy to be repeated every 21 days x 8, completed 12/28/2020   09/09/2020 - 10/20/2020 Radiation Therapy   Site Technique Total Dose (Gy) Dose per Fx (Gy) Completed Fx Beam Energies  Breast, Left: Breast_Lt 3D 50/50 2 25/25 6X, 10X  Breast, Left: Breast_Lt_Bst 3D 10/10 2 5/5 6X, 10X     07/06/2022 Relapse/Recurrence   Left breast biopsy: high grade DCIS, 1.27mm in greatest extent, suspicious for microinvasion Right Breast biopsy: CSL, 2mm, negative for carcinoma   08/03/2022 Surgery   Right lumpectomy: focal ductal and stromal reactive atypia adjacent to biopsy site, fibrocystic changes with usual ductal hyperplasia, focal changes consistent with remnants of CSL. Left lumpectomy: fibrocystic changes, no residual DCIS, margins negative, 0 LN biopsied, ER 80%, PR negative.     08/03/2022 Cancer Staging    Staging form: Breast, AJCC 8th Edition - Pathologic stage from 08/03/2022: Stage 0 (rpTis (DCIS), pN0, cM0, ER+, PR-) - Signed by Crawford Morna Pickle, NP on 11/06/2022 Stage prefix: Recurrence Nuclear grade: G3   08/2022 -  Anti-estrogen oral therapy   Change from Anastrozole  to Letrozole      CHIEF COMPLIANT:   HISTORY OF PRESENT ILLNESS:   History of Present Illness A 73 year old female with a history of breast cancer presents for follow-up regarding her seroma and medication management.  She experiences ongoing issues with a seroma that developed last year, causing intermittent leakage, primarily at night, with significant fluid release. During the day, leakage is minimal, with small drops. The fluid is yellow and clear. The last significant leakage occurred in September, with another episode this morning.  She is taking pravastatin  for cholesterol management and has discontinued red yeast extract. She is on letrozole , experiencing mild but manageable hot flashes as a side effect. She plans to refill her prescription soon, with about ten pills remaining. Methotrexate  is taken consistently at bedtime without missed doses.  She has a family history of breast cancer, with her grandmother, sister, and mother affected. Her sister had an aggressive form and passed away at 29. Her mother had breast cancer twice, at 67 and 95, and was on letrozole  for seven years. She does not have the BRCA gene mutation.     ALLERGIES:  is allergic to tape, betamethasone dipropionate, caffeine, cetirizine hcl, colesevelam, fosamax [alendronate], ibuprofen, lisinopril , loratadine, nsaids, statins,  neosporin [bacitracin-polymyxin b], and sulfa antibiotics.  MEDICATIONS:  Current Outpatient Medications  Medication Sig Dispense Refill   aspirin  81 MG chewable tablet Chew 81 mg by mouth daily.     Calcium  Carbonate-Vitamin D  (CALCIUM -VITAMIN D3 PO) Take 1,200 mg by mouth daily.     co-enzyme Q-10 30 MG  capsule Take 30 mg by mouth daily.     letrozole  (FEMARA ) 2.5 MG tablet Take 1 tablet (2.5 mg total) by mouth daily. 90 tablet 3   Omega-3 Fatty Acids (FISH OIL) 1000 MG CAPS Take 1,000 mg by mouth daily.     Pyridoxine HCl (VITAMIN B6) 250 MG TABS Take 250 mg by mouth daily.     Red Yeast Rice Extract (RED YEAST RICE PO) Take 1 capsule by mouth daily.     Specialty Vitamins Products (VITAMINS FOR HAIR PO) Take 2 each by mouth once.     vitamin B-12 (CYANOCOBALAMIN ) 1000 MCG tablet Take 1,000 mcg by mouth daily.     vitamin C (ASCORBIC ACID) 250 MG tablet Take 250 mg by mouth daily.     zinc gluconate 50 MG tablet Take 50 mg by mouth daily.     No current facility-administered medications for this visit.    PHYSICAL EXAMINATION: ECOG PERFORMANCE STATUS: 1 - Symptomatic but completely ambulatory  There were no vitals filed for this visit. There were no vitals filed for this visit.  Physical Exam   (exam performed in the presence of a chaperone)  LABORATORY DATA:  I have reviewed the data as listed    Latest Ref Rng & Units 07/26/2022    2:04 PM 08/29/2021    3:30 PM 08/02/2021    2:07 PM  CMP  Glucose 70 - 99 mg/dL 897  895  95   BUN 8 - 23 mg/dL 14  11  14    Creatinine 0.44 - 1.00 mg/dL 9.10  8.87  8.60   Sodium 135 - 145 mmol/L 139  142  138   Potassium 3.5 - 5.1 mmol/L 4.0  4.4  3.9   Chloride 98 - 111 mmol/L 102  104  103   CO2 22 - 32 mmol/L 29  30  28    Calcium  8.9 - 10.3 mg/dL 89.8  9.9  9.5   Total Protein 6.5 - 8.1 g/dL   7.4   Total Bilirubin 0.3 - 1.2 mg/dL   0.3   Alkaline Phos 38 - 126 U/L   54   AST 15 - 41 U/L   28   ALT 0 - 44 U/L   26     Lab Results  Component Value Date   WBC 4.7 07/26/2022   HGB 11.7 (L) 07/26/2022   HCT 37.3 07/26/2022   MCV 92.1 07/26/2022   PLT 246 07/26/2022   NEUTROABS 2.0 08/02/2021    ASSESSMENT & PLAN:  Malignant neoplasm of upper-outer quadrant of right breast in female, estrogen receptor negative (HCC) 06/20/2017: Right  breast cancer T1BN0 stage Ia grade 3, ER/PR negative, HER2 +0/5 lymph nodes negative (genetics negative), adjuvant chemo with Taxol  Herceptin  with Herceptin  maintenance for 1 year, adjuvant radiation  06/15/2020: Left breast cancer T1N0 grade 3 IDC, weak ER positive, PR negative, biopsy had HER2 amplified with a Ki-67 of 45%, status post left lumpectomy: T1CN0 stage Ib grade 3, 0/5 lymph nodes (HER2 negative on final path) Adjuvant chemo with CMF Herceptin  followed by Herceptin  maintenance completed February 2023, XRT  08/03/2022 right lumpectomy: Focal ductal and stromal reactive atypia, remnants of  CSL; left lumpectomy: Fibrocystic changes with UDH (no residual DCIS), margins negative, overall size based on biopsy 0.2 cm, ER 80%, PR 0%   Prior treatment: Adjuvant antiestrogen therapy with  anastrozole  1 mg daily  started 02/2021 stopped when she developed recurrence of DCIS   Breast Cancer Surveillance:  Mammograms and ultrasound right breast 07/02/2023: No evidence of malignancy.  4.2 cm seroma   Current treatment: Switched from anastrozole  to letrozole  08/14/2022 Letrozole  toxicities: None   Breast Seroma Noted fluid leakage from breast stoma, which has decreased since October. No discomfort reported. -Continue to monitor seroma. -Plan for mammogram in January to assess any changes.   -Next follow-up appointment in one year.  ------------------------------------- Assessment and Plan Assessment & Plan Estrogen receptor positive breast cancer, status post treatment, on adjuvant letrozole  therapy On adjuvant letrozole  therapy since 2024. Manageable hot flashes. No BRCA mutation. Family history of breast cancer. Discussed recurrence risk and Guardant test for early detection. - Continue letrozole  therapy until February 2029. - Order DEXA every two years. - Provided information on Guardant blood test. - Schedule DEXA with mammogram in January.  Breast seroma with intermittent  leakage Intermittent leakage, primarily at night. Fluid yellow and clear. Discussed drainage option with pressure garments. Prefers monitoring due to infrequency. - Monitor seroma leakage. - Consider drainage if leakage worsens.      No orders of the defined types were placed in this encounter.  The patient has a good understanding of the overall plan. she agrees with it. she will call with any problems that may develop before the next visit here.  I personally spent a total of 30 minutes in the care of the patient today including preparing to see the patient, getting/reviewing separately obtained history, performing a medically appropriate exam/evaluation, counseling and educating, placing orders, referring and communicating with other health care professionals, documenting clinical information in the EHR, independently interpreting results, communicating results, and coordinating care.   Viinay K Alexus Galka, MD 05/08/24

## 2024-05-08 NOTE — Assessment & Plan Note (Signed)
 06/20/2017: Right breast cancer T1BN0 stage Ia grade 3, ER/PR negative, HER2 +0/5 lymph nodes negative (genetics negative), adjuvant chemo with Taxol  Herceptin  with Herceptin  maintenance for 1 year, adjuvant radiation 06/15/2020: Left breast cancer T1N0 grade 3 IDC, weak ER positive, PR negative, biopsy had HER2 amplified with a Ki-67 of 45%, status post left lumpectomy: T1CN0 stage Ib grade 3, 0/5 lymph nodes (HER2 negative on final path) Adjuvant chemo with CMF Herceptin  followed by Herceptin  maintenance completed February 2023, XRT   Prior treatment: Adjuvant antiestrogen therapy with  anastrozole  1 mg daily  started 02/2021 stopped when she developed recurrence of DCIS   Breast Cancer Surveillance:  Mammograms 06/29/22: new 0.3 cm calcs Right breast central to nipple middle depth suspicious for malignancy (biopsy recommended), 1 cm pleomorphic calcs left breast suspicious, total span 3.8 cm 2. Left breast Biopsy: HG DCIS, focally suspicious for microinvasion ER 80%, PR 0%; Right Breast biopsy: CSL, focal necrosis 3.  08/03/2022 right lumpectomy: Focal ductal and stromal reactive atypia, remnants of CSL; left lumpectomy: Fibrocystic changes with UDH (no residual DCIS), margins negative, overall size based on biopsy 0.2 cm, ER 80%, PR 0%   Current treatment: Switched from anastrozole  to letrozole  08/14/2022 Letrozole  toxicities: None   Breast Seroma Noted fluid leakage from breast stoma, which has decreased since October. No discomfort reported. -Continue to monitor seroma. -Plan for mammogram in January to assess any changes.   -Next follow-up appointment in one year.

## 2024-05-21 DIAGNOSIS — C50912 Malignant neoplasm of unspecified site of left female breast: Secondary | ICD-10-CM | POA: Diagnosis not present

## 2024-05-21 DIAGNOSIS — Z8673 Personal history of transient ischemic attack (TIA), and cerebral infarction without residual deficits: Secondary | ICD-10-CM | POA: Diagnosis not present

## 2024-05-21 DIAGNOSIS — E559 Vitamin D deficiency, unspecified: Secondary | ICD-10-CM | POA: Diagnosis not present

## 2024-05-21 DIAGNOSIS — E7800 Pure hypercholesterolemia, unspecified: Secondary | ICD-10-CM | POA: Diagnosis not present

## 2024-05-21 DIAGNOSIS — D500 Iron deficiency anemia secondary to blood loss (chronic): Secondary | ICD-10-CM | POA: Diagnosis not present

## 2024-05-21 DIAGNOSIS — M1710 Unilateral primary osteoarthritis, unspecified knee: Secondary | ICD-10-CM | POA: Diagnosis not present

## 2024-05-21 DIAGNOSIS — M810 Age-related osteoporosis without current pathological fracture: Secondary | ICD-10-CM | POA: Diagnosis not present

## 2024-05-21 DIAGNOSIS — I10 Essential (primary) hypertension: Secondary | ICD-10-CM | POA: Diagnosis not present

## 2024-05-21 DIAGNOSIS — J309 Allergic rhinitis, unspecified: Secondary | ICD-10-CM | POA: Diagnosis not present

## 2024-05-21 DIAGNOSIS — R7303 Prediabetes: Secondary | ICD-10-CM | POA: Diagnosis not present

## 2024-07-08 ENCOUNTER — Encounter: Payer: Self-pay | Admitting: Adult Health

## 2025-05-11 ENCOUNTER — Inpatient Hospital Stay: Admitting: Hematology and Oncology
